# Patient Record
Sex: Female | Born: 1935 | Race: White | Hispanic: No | Marital: Married | Smoking: Never smoker
Health system: Southern US, Community
[De-identification: ages and names within clinical notes are randomized; demographics above are authoritative.]

## PROBLEM LIST (undated history)

## (undated) DIAGNOSIS — I099 Rheumatic heart disease, unspecified: Secondary | ICD-10-CM

## (undated) DIAGNOSIS — R609 Edema, unspecified: Secondary | ICD-10-CM

## (undated) DIAGNOSIS — I38 Endocarditis, valve unspecified: Secondary | ICD-10-CM

## (undated) DIAGNOSIS — I482 Chronic atrial fibrillation, unspecified: Secondary | ICD-10-CM

## (undated) DIAGNOSIS — Z7901 Long term (current) use of anticoagulants: Secondary | ICD-10-CM

## (undated) DIAGNOSIS — I351 Nonrheumatic aortic (valve) insufficiency: Secondary | ICD-10-CM

## (undated) DIAGNOSIS — M1990 Unspecified osteoarthritis, unspecified site: Secondary | ICD-10-CM

## (undated) HISTORY — DX: Edema, unspecified: R60.9

## (undated) HISTORY — DX: Chronic atrial fibrillation, unspecified: I48.20

## (undated) HISTORY — DX: Long term (current) use of anticoagulants: Z79.01

## (undated) HISTORY — PX: CATARACT EXTRACTION W/ INTRAOCULAR LENS  IMPLANT, BILATERAL: SHX1307

## (undated) HISTORY — PX: BACK SURGERY: SHX140

## (undated) HISTORY — DX: Nonrheumatic aortic (valve) insufficiency: I35.1

## (undated) HISTORY — DX: Unspecified osteoarthritis, unspecified site: M19.90

## (undated) HISTORY — PX: ABDOMINAL HYSTERECTOMY: SHX81

## (undated) HISTORY — DX: Endocarditis, valve unspecified: I38

## (undated) HISTORY — PX: MITRAL VALVE REPLACEMENT: SHX147

## (undated) HISTORY — PX: AORTIC VALVE REPLACEMENT: SHX41

## (undated) HISTORY — DX: Rheumatic heart disease, unspecified: I09.9

---

## 1999-07-30 ENCOUNTER — Emergency Department (HOSPITAL_COMMUNITY): Admission: EM | Admit: 1999-07-30 | Discharge: 1999-07-30 | Payer: Self-pay | Admitting: Emergency Medicine

## 1999-07-30 ENCOUNTER — Encounter: Payer: Self-pay | Admitting: Emergency Medicine

## 1999-08-11 ENCOUNTER — Encounter: Payer: Self-pay | Admitting: Neurological Surgery

## 1999-08-11 ENCOUNTER — Inpatient Hospital Stay (HOSPITAL_COMMUNITY): Admission: AD | Admit: 1999-08-11 | Discharge: 1999-08-15 | Payer: Self-pay | Admitting: Neurological Surgery

## 2000-11-02 ENCOUNTER — Ambulatory Visit (HOSPITAL_COMMUNITY): Admission: RE | Admit: 2000-11-02 | Discharge: 2000-11-02 | Payer: Self-pay | Admitting: Geriatric Medicine

## 2004-12-31 ENCOUNTER — Inpatient Hospital Stay (HOSPITAL_COMMUNITY): Admission: RE | Admit: 2004-12-31 | Discharge: 2005-01-05 | Payer: Self-pay | Admitting: Ophthalmology

## 2006-03-02 ENCOUNTER — Ambulatory Visit (HOSPITAL_COMMUNITY): Admission: RE | Admit: 2006-03-02 | Discharge: 2006-03-02 | Payer: Self-pay | Admitting: Geriatric Medicine

## 2010-05-06 ENCOUNTER — Ambulatory Visit: Payer: Self-pay | Admitting: Cardiology

## 2010-06-03 ENCOUNTER — Ambulatory Visit: Payer: Self-pay | Admitting: Cardiology

## 2010-06-19 ENCOUNTER — Ambulatory Visit (HOSPITAL_COMMUNITY): Admission: RE | Admit: 2010-06-19 | Discharge: 2010-06-19 | Payer: Self-pay | Admitting: Cardiology

## 2010-06-19 ENCOUNTER — Ambulatory Visit: Payer: Self-pay | Admitting: Cardiology

## 2010-06-24 ENCOUNTER — Ambulatory Visit: Payer: Self-pay | Admitting: Cardiology

## 2010-07-15 ENCOUNTER — Ambulatory Visit: Payer: Self-pay | Admitting: Cardiology

## 2010-08-05 ENCOUNTER — Ambulatory Visit: Payer: Self-pay | Admitting: Cardiology

## 2010-09-16 ENCOUNTER — Ambulatory Visit: Payer: Self-pay | Admitting: Cardiology

## 2010-10-16 ENCOUNTER — Ambulatory Visit: Payer: Self-pay | Admitting: Cardiology

## 2010-10-29 ENCOUNTER — Ambulatory Visit: Payer: Self-pay | Admitting: Cardiology

## 2010-11-12 ENCOUNTER — Encounter: Payer: Self-pay | Admitting: Cardiology

## 2010-11-12 DIAGNOSIS — Z7901 Long term (current) use of anticoagulants: Secondary | ICD-10-CM | POA: Insufficient documentation

## 2010-11-12 DIAGNOSIS — I099 Rheumatic heart disease, unspecified: Secondary | ICD-10-CM | POA: Insufficient documentation

## 2010-11-12 DIAGNOSIS — M1990 Unspecified osteoarthritis, unspecified site: Secondary | ICD-10-CM | POA: Insufficient documentation

## 2010-11-12 DIAGNOSIS — I482 Chronic atrial fibrillation, unspecified: Secondary | ICD-10-CM | POA: Insufficient documentation

## 2010-11-12 DIAGNOSIS — I351 Nonrheumatic aortic (valve) insufficiency: Secondary | ICD-10-CM | POA: Insufficient documentation

## 2010-11-12 DIAGNOSIS — I38 Endocarditis, valve unspecified: Secondary | ICD-10-CM | POA: Insufficient documentation

## 2010-11-25 ENCOUNTER — Other Ambulatory Visit: Payer: Medicare Other

## 2010-12-11 ENCOUNTER — Encounter (INDEPENDENT_AMBULATORY_CARE_PROVIDER_SITE_OTHER): Payer: Medicare Other

## 2010-12-11 DIAGNOSIS — I4891 Unspecified atrial fibrillation: Secondary | ICD-10-CM

## 2010-12-30 ENCOUNTER — Other Ambulatory Visit: Payer: Medicare Other | Admitting: *Deleted

## 2010-12-31 ENCOUNTER — Ambulatory Visit: Payer: Self-pay | Admitting: Cardiology

## 2011-01-06 ENCOUNTER — Ambulatory Visit: Payer: Medicare Other | Admitting: Cardiology

## 2011-01-06 ENCOUNTER — Ambulatory Visit (INDEPENDENT_AMBULATORY_CARE_PROVIDER_SITE_OTHER): Payer: Medicare Other | Admitting: Cardiology

## 2011-01-06 ENCOUNTER — Encounter: Payer: Self-pay | Admitting: Cardiology

## 2011-01-06 VITALS — BP 166/60 | HR 74 | Ht 65.0 in | Wt 137.4 lb

## 2011-01-06 DIAGNOSIS — I482 Chronic atrial fibrillation, unspecified: Secondary | ICD-10-CM

## 2011-01-06 DIAGNOSIS — I38 Endocarditis, valve unspecified: Secondary | ICD-10-CM

## 2011-01-06 DIAGNOSIS — I359 Nonrheumatic aortic valve disorder, unspecified: Secondary | ICD-10-CM | POA: Insufficient documentation

## 2011-01-06 DIAGNOSIS — I4891 Unspecified atrial fibrillation: Secondary | ICD-10-CM

## 2011-01-06 DIAGNOSIS — R030 Elevated blood-pressure reading, without diagnosis of hypertension: Secondary | ICD-10-CM

## 2011-01-06 DIAGNOSIS — Z7901 Long term (current) use of anticoagulants: Secondary | ICD-10-CM

## 2011-01-06 LAB — PROTIME-INR: Prothrombin Time: 30.6 seconds — ABNORMAL HIGH (ref 11.6–15.2)

## 2011-01-06 NOTE — Progress Notes (Signed)
HPI [name] Chung is seen today for followup. She does complain of frequent headaches which occur almost every day. She has occasional lightheadedness if she gets up quickly or turns quickly. She remains very active walking on her treadmill 5 days a week. She denies any chest pain, shortness of breath, or palpitations. Allergies  Allergen Reactions  . Asa Buff (Mag (Aspirin Buffered)   . Codeine   . Ultram (Tramadol Hcl)     Current Outpatient Prescriptions on File Prior to Visit  Medication Sig Dispense Refill  . Ascorbic Acid (VITAMIN C PO) Take by mouth 3 (three) times a week.       Marland Kitchen BETA CAROTENE PO Take by mouth daily.        . butalbital-acetaminophen-caffeine (FIORICET, ESGIC) 50-325-40 MG per tablet 1 tablet Twice daily.      . digoxin (LANOXIN) 0.25 MG tablet Take 375 mcg by mouth daily.        Marland Kitchen estrogens, conjugated, (PREMARIN) 1.25 MG tablet Take 1.25 mg by mouth daily.        . hydrochlorothiazide 50 MG tablet Take 50 mg by mouth daily.        Marland Kitchen LORazepam (ATIVAN) 1 MG tablet Take 1 mg by mouth as needed.        . temazepam (RESTORIL) 30 MG capsule Take 30 mg by mouth daily.       . Warfarin Sodium (COUMADIN PO) Take by mouth as directed.          Past Medical History  Diagnosis Date  . Valvular heart disease   . Chronic atrial fibrillation   . Chronic anticoagulation   . Rheumatic heart disease   . Aortic insufficiency   . Arthritis     Past Surgical History  Procedure Date  . Aortic valve replacement   . Mitral valve replacement   . Back surgery   . Abdominal hysterectomy   . Cataract extraction w/ intraocular lens  implant, bilateral     Family History  Problem Relation Age of Onset  . Heart attack Mother   . Stroke Mother   . Diabetes Mother   . Other Father     CAR ACCIDENT    History   Social History  . Marital Status: Married    Spouse Name: N/A    Number of Children: 0  . Years of Education: N/A   Occupational History  . 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 Smoking status: Never Smoker   . Smokeless tobacco: Not on file  . Alcohol Use: No  . Drug Use: No  . Sexually Active: Not on file   Other Topics Concern  . Not on file   Social History Narrative  . No narrative on file    ROS The patient denies any heat or cold intolerance.  No weight gain or weight loss.  The patient denies blurry vision.  There is no cough or sputum production.  The patient denies dizziness.  There is no hematuria or hematochezia.  The patient denies any muscle aches or arthritis.  The patient denies any rash.  The patient denies frequent falling or instability.  There is no history of depression or anxiety.  All other systems were reviewed and are negative.  PHYSICAL EXAM BP 166/60  Pulse 74  Ht 5\' 5"  (1.651 m)  Wt 137 lb 6.4 oz (62.324 kg)  BMI 22.86 kg/m2 She is an elderly female in no acute distress.  Pupils are equal round and reactive to light and accommodation. Sclera clear. Oropharynx is clear. Neck is supple no JVD, adenopathy, thyromegaly, or bruits. Carotid upstrokes are good. Lungs are clear. Cardiac exam reveals a good mechanical mitral valve click. There is a grade 2/6 holosystolic murmur in the aortic area radiating to the apex. She also has a grade 2/6 diastolic murmur at the right upper sternal border. PMI is normal. There is no S3. Abdomen soft and nontender without masses or bruits. Femoral and pedal pulses are 2+ and symmetric. She has no edema. Skin is warm and dry. She is alert and oriented x3. Cranial nerves II through XII are intact.  Laboratory data: INR today is 3.1.  ASSESSMENT AND PLAN

## 2011-01-06 NOTE — Assessment & Plan Note (Signed)
Rate is well controlled. She is anticoagulated. She is asymptomatic. Continue current therapy.

## 2011-01-06 NOTE — Assessment & Plan Note (Signed)
Blood pressure is unusually elevated today. Recommended a low sodium diet. I have asked that she monitor her blood pressure closely at home and if it remains elevated we may need to add antihypertensive therapy.

## 2011-01-06 NOTE — Assessment & Plan Note (Signed)
Patient's last echocardiogram in September 2011 showed stable valve prosthetic function. She has chronic moderate aortic insufficiency. Left ventricular function is normal. She is asymptomatic. We will continue with anticoagulant therapy and routine SBE prophylaxis. We will repeat an echocardiogram in the fall.

## 2011-01-06 NOTE — Patient Instructions (Signed)
Continue regular exercise.  Monitor blood pressure at home. Call if your readings are staying above 140 systolic.  Avoid sodium.  Continue current Coumadin dose and we will recheck an INR in 4 weeks.   We will schedule a follow up office visit in 6 months.

## 2011-01-06 NOTE — Assessment & Plan Note (Signed)
INR is therapeutic. We will recheck again in 4 weeks.

## 2011-01-08 ENCOUNTER — Telehealth: Payer: Self-pay | Admitting: Cardiology

## 2011-01-08 NOTE — Telephone Encounter (Signed)
Message copied by Murrell Redden on Thu Jan 08, 2011 11:44 AM ------      Message from: [place], PETER      Created: Wed Jan 07, 2011  5:38 PM       Lab drawn Inr and finger stick are comparable 2.93 and 3.1. Please report.

## 2011-01-08 NOTE — Telephone Encounter (Signed)
PT WANTING HER LATEST LAB RESULTS.

## 2011-01-08 NOTE — Telephone Encounter (Signed)
LM that coumadin draw was 3.1;compared to finger stick 2.9;  CSD

## 2011-02-03 ENCOUNTER — Ambulatory Visit (INDEPENDENT_AMBULATORY_CARE_PROVIDER_SITE_OTHER): Payer: Medicare Other | Admitting: *Deleted

## 2011-02-03 DIAGNOSIS — Z7901 Long term (current) use of anticoagulants: Secondary | ICD-10-CM | POA: Insufficient documentation

## 2011-02-03 DIAGNOSIS — I359 Nonrheumatic aortic valve disorder, unspecified: Secondary | ICD-10-CM

## 2011-02-03 DIAGNOSIS — Z954 Presence of other heart-valve replacement: Secondary | ICD-10-CM | POA: Insufficient documentation

## 2011-02-06 ENCOUNTER — Encounter: Payer: Self-pay | Admitting: Cardiology

## 2011-02-06 ENCOUNTER — Encounter: Payer: Self-pay | Admitting: Cardiovascular Disease

## 2011-02-20 NOTE — H&P (Signed)
[place]  Patient:    [name] Chung                        MRN: [id number] Adm. Date:  [id number] Attending:  Jonne Ply                         History and Physical  ADMISSION DIAGNOSIS:  Herniated nucleus pulposus, L4-5, with left L5 radiculopathy.  HISTORY OF PRESENT ILLNESS:  The patient is a 77 year old right-handed individual who was seen by me a little over a week and a half ago.  She had developed a severe and unrelenting left lower extremity radiculopathy, seemingly in an L5 distribution.  She was started on oral steroids but found the pain was unresponsive to this modality of therapy.  She has been miserable with pain despite using significant doses of narcotic analgesics.  Complicating the patients course is he fact that she has had valvular heart disease and has had two replacements of two valves.  In 1982, she had aortic and mitral valve replacements with Bjork-Shiley prostheses; this was redone in 1995 and she had a St. Jude mitral valve and an allograft aortic valve placed.  She has been in chronic atrial fibrillation and has been maintained on Coumadin for the mechanical valve.  Patient has had her anticoagulation stopped this past Saturday in preparation for surgical decompression of her disc herniation today.  The patient has otherwise been tolerating activities well, save for the most recent back and left lower extremity pain.  PAST MEDICAL HISTORY:  She has had rheumatic heart disease.  She has no hypertension, diabetes, stroke or cancer.  There is no history of tuberculosis.  She has had a bone graft procedure during her open heart surgery in 1995 with the valve replacements as noted above.  She has had a hysterectomy for fibroids in he past.  CURRENT MEDICATIONS: 1. Coumadin 10 mg alternating with 7.5 mg every other day. 2. Lanoxin 0.25 mg one and a half tablets a day. 3. Potassium 20 mEq a day. 4.  Hydrochlorothiazide 50 ___ a day. 5. Premarin 1.25 mg daily. 6. Restoril 30 mg a day at bedtime. 7. Vitamin C and calcium.  SOCIAL HISTORY:  The patient is widowed.  She lives along.  FAMILY HISTORY:  Her father died in 32s in motor vehicle accident.  Mother died at age 31; had diabetes, stroke and myocardial infarction.  She has a brother, ge 63, with apparently no medical problems.  She had a sister who died secondary to Alzheimers disease.  She has another sister in good health.  One sister died at  age 43 secondary to suicide.  She has no children.  REVIEW OF SYSTEMS:  Notable only for the items in the history of present illness.  PHYSICAL EXAMINATION:  GENERAL:  She is an alert, oriented and cooperative individual in no overt distress when she is sitting; however, she stands very slowly and cautiously.  She walks  with a 10 degree forward stoop.  She is very antalgic on the left lower extremity.  BACK:  Palpation of her back reveals a remarkable amount of paravertebral spinal tenderness, particularly over the lumbosacral junction.  NEUROLOGIC:  Motor strength in the lower extremities reveals iliopsoas, quadriceps, tibialis anterior and gastrocnemii to have good tone and bulk save for the tibialis anterior on the left side, which is weak to 4/5.  Sensation is diminished  to pin and light touch at the ankle; it is otherwise normal.  Deep tendon reflexes are 2+ in both patellae, 1+ in the Achilles.  Babinskis are downgoing in the upper extremities.  Her reflexes are normal.  Tone and bulk in the upper extremities re normal.  Cranial nerve examination reveals the pupils to be 3 mm, briskly reactive to light and accommodation.  Extraocular movements are full.  Face is symmetric to grimace.  Tongue and uvula are in the midline.  Sclerae and conjunctivae are clear.  NECK:  No masses and no bruits are heard.  LUNGS:  Clear to auscultation.  HEART:  Mechanical  heart sound plus a prominent systolic/diastolic murmur.  ABDOMEN:  Soft.  Bowel sounds are positive.  No masses are palpable.  EXTREMITIES:  No clubbing, cyanosis, or edema.  IMPRESSION:  The patient has a herniated nucleus pulposus at L4-5 on the left. She is now being admitted to undergo surgical decompression followed by re-coumadinization.  She has been offered Coumadin for the last two days and her INR today is 1.2 with a prothrombin time of 14.5 seconds.  DD:  08/11/99 TD:  08/12/99 Job: [id number]

## 2011-02-20 NOTE — Op Note (Signed)
[place]  Patient:    [name] Chung                        MRN: [id number] Proc. Date: 08/11/99 Adm. Date:  [id number] Attending:  Jonne Ply                           Operative Report  PREOPERATIVE DIAGNOSIS:  L4-5 herniated nucleus pulposus with left lumbar radiculopathy.  POSTOPERATIVE DIAGNOSIS:  L4-5 herniated nucleus pulposus with left lumbar radiculopathy.  PROCEDURE:  Lumbar laminotomy and microdiskectomy, L4-5 left.  SURGEON:  Stefani Dama, M.D.  ASSISTANT:  Hewitt Shorts, M.D.  ANESTHESIA:  General endotracheal.  INDICATIONS:   The patient is a 77 year old individual, who has had a left lumbar radiculopathy secondary to a herniated nucleus pulposus at L4-5.  She has been intolerant of the pain despite significant medication.  She has the added complication of being on Coumadin anticoagulation for a prosthetic heart valve.  The coagulopathy has been reversed for this procedure.  DESCRIPTION OF PROCEDURE:  The patient was brought to the operating room supine on a stretcher.  After smooth induction of general endotracheal anesthesia, she was turned prone.  The back was shaved, prepped with Duraprep and draped in a sterile fashion.  A localizing x-ray was taken, then a small incision was created over he midline of the dorsum of the back and carried down to the lumbodorsal fascia, which was opened on the left side.  Intralaminar space of L4-5 was then cleared and a  laminotomy was created after a self-retaining retractor was placed into the wound. The inferior margin of the lamina of L4 was removed out to the mesial wall of the facet.  The bony edges were waxed for hemostasis.  Then, the redundant yellow ligament in this area was taken up with 2 and 3 mm Kerrison punch.  Laminotomy as enlarged.  The L5 nerve root was noted to be pushed out laterally against the bony edge.  This was retracted gently over a  significant mass.  The mass was at the [facility] space and just inferior to it.  It was incised with a #15 blade and then a combination of curets and rongeurs were used to evacuate the disk space.  This portion of the procedure was done with magnification of a microscope and microdissection technique, so as to minimize any hemorrhage in this area.  Care was taken to protect the L5 nerve root while a diskectomy ensued.  Once the disk space was completely evacuated of a significant quantity of markedly degenerated material, the area was checked for hemostasis.  The L5 nerve root was noted to e free and clear in its travel out the foramen.  The posterior longitudinal ligament was noted to be laxed and free and not buckled.  Area was then irrigated copiously with antibiotic irrigating solution and microscope was removed from the field and the lumbodorsal fascia was closed with #1 Vicryl in interrupted fashion, 2-0 Vicryl in the subcutaneous and subcuticular tissues.  Steri-Strips are placed on the skin. Patient tolerated the procedure well, was returned to recovery room in stable condition. DD:  08/11/99 TD:  08/13/99 Job: [id number]

## 2011-02-20 NOTE — H&P (Signed)
[name] Chung, [name] Chung NO.:  [id number]   MEDICAL RECORD NO.:  [id number]          PATIENT TYPE:  INP   LOCATION:  NA                           FACILITY:  [facility]   PHYSICIAN:  Peter M. [place], M.D.  DATE OF BIRTH:  [birth year]   DATE OF ADMISSION:  DATE OF DISCHARGE:                                HISTORY & PHYSICAL   HISTORY OF PRESENT ILLNESS:  [name] Chung is a 77 year old white female who has  a history of valvular heart disease and chronic atrial fibrillation.  She is  requiring eye surgery for blepharoplasty and due to the fact that she needs  to come off her Coumadin she is now admitted for bridging heparin therapy.  [name] initially underwent valve surgery in [facility] at the [facility] with Bjork-Shiley valves.  She had redo valve surgery in 1995 at  [facility] using a homograft aortic valve and a St.  Jude mechanical mitral valve.  She has a history of chronic atrial  fibrillation and flutter.  She has been on chronic anticoagulant therapy.  She does have chronic aortic insufficiency.  She has been asymptomatic.  However, given her eye risk it was felt that she requires bridging heparin  therapy for her surgery and she is admitted for that purpose.  The patient  denies shortness of breath, chest pain, palpitation, dizziness, or syncope.  She has had vision loss related to prior eye surgery and is admitted for  blepharoplasty to try to improve her vision.   PAST MEDICAL HISTORY:  1.  Status post aortic mitral valve replacements as previously noted.  2.  History of chronic atrial fibrillation and flutter.  3.  History of chronic aortic insufficiency.  4.  Prior low back surgery in 2000.  5.  Previous hysterectomy.  6.  Previous eye surgery x3 with cataract removal and lens implants x2.   ALLERGIES:  CODEINE.   CURRENT MEDICATIONS:  1.  Coumadin 7.5 mg five days a week, 10 mg two days a week.  2.  Lanoxin 0.25 mg 1-1/2  tablets daily.  3.  Potassium 10 mEq daily.  4.  Hydrochlorothiazide 50 mg/d.  5.  Temazepam q.h.s.  6.  Multivitamin daily.   SOCIAL HISTORY:  The patient is married.  She has no children.  She is  retired.  She denies tobacco or alcohol use.   FAMILY HISTORY:  Father died of motor vehicle accident.  Mother died at age  67 with a stroke and also had myocardial infarction.  She has four siblings  who have passed away.   REVIEW OF SYSTEMS:  She has had no edema, orthopnea, or PND.  She has had no  syncope.  She has had no recent bleeding disorders.  No recent bowel or  bladder complaints.  Other review of systems are negative.   PHYSICAL EXAMINATION:  The patient is a pleasant white female in no  distress.  Weight is 137, blood pressure 150/60, pulse 72 and irregular.  Weight is 137.  Respirations are normal.  HEENT:  Pupils are  round and reactive.  Sclerae are clear.  Conjunctivae are  clear.  Oropharynx is clear.  NECK:  Without JVD, adenopathy, or bruits.  She has normal carotid  upstrokes.  LUNGS:  Clear to auscultation and percussion.  CARDIAC:  A good mechanical mitral valve click.  There is grade 2-3/6  holodiastolic murmur heard best at the aortic area radiating to the left  sternal border.  There is no S3.  ABDOMEN:  Soft and nontender.  No masses or hepatosplenomegaly.  EXTREMITIES:  Without edema.  NEUROLOGIC:  Nonfocal.   IMPRESSION:  1.  Combined valvular heart disease with homograft aortic valve replacement      and mechanical St. Jude mitral valve replacement.  2.  Chronic aortic insufficiency.  3.  Chronic atrial fibrillation with flutter.  4.  Vision loss related to prior eye surgery.  5.  Chronic anticoagulation.   PLAN:  Coumadin was held as of 12/30/2004.  We will admit her today, obtain  routine lab work.  When pro time drops below 2 will initiate IV heparin  therapy per protocol.  Anticipate eye surgery on Friday and then will resume  Coumadin  afterwards with bridging heparin.  I will continue her other  medications as before.  The patient does not need SBE prophylaxis for this  procedure.      PMJ/MEDQ  D:  12/31/2004  T:  12/31/2004  Job:  [id number]   cc:   Lilian Kapur. Dillon Bjork, M.D.  Fax: [phone]   Lloyd Huger MD [facility]

## 2011-02-20 NOTE — Discharge Summary (Signed)
[name], [name] NO.:  Chung   MEDICAL RECORD NO.:  [id number]          PATIENT TYPE:  INP   LOCATION:  3709                         FACILITY:  [facility]   PHYSICIAN:  Peter M. [place], M.D.  DATE OF BIRTH:  05-Mar-1933   DATE OF ADMISSION:  12/31/2004  DATE OF DISCHARGE:  01/05/2005                                 DISCHARGE SUMMARY   HISTORY OF PRESENT ILLNESS:  [name] Chung is a 77 year old, white female who  has a history of combined valvular heart disease.  She is status post redo  valve surgery with a homograft aortic valve replacement and a St. Jude  mechanical mitral valve replacement.  She has a history of atrial  fibrillation and flutter.  She required blepharoplasty for improvement in  vision and was admitted for bridging heparin therapy while off her Coumadin  for surgery.  She has chronic aortic insufficiency.  For details for past  medical history, social history, family history and physical exam, please  see admission History and Physical.   LABORATORY DATA:  White count 6800, hemoglobin 12.7, hematocrit 36.6,  platelets 166,000.  Protime on admission was 15.9 with an INR of 1.4.  Her  Coumadin had been held the day prior to admission.  Sodium 135, potassium  4.1, chloride 97, CO2 31, BUN 20, creatinine 0.7, glucose of 84.  All other  chemistries were normal.  Digoxin level 1.0.   Chest x-ray showed no active disease.  ECG showed ectopic atrial rhythm with  minor nonspecific ST abnormality.   HOSPITAL COURSE:  The patient was admitted to telemetry.  Her Coumadin was  held.  She was placed on IV heparin per pharmacy protocol.  Her INR  decreased to 1.1 by the day of surgery, January 02, 2005.  She underwent  surgery by Dr. Dillon Bjork with blepharoplasty which was tolerated well.  Following surgery the patient's Coumadin was resumed.  She was started back  on IV heparin.  She was given 12.5 mg of oral Coumadin for the initial 3  days.  Her INR initially  declined to 1.0, then 1.2 and on the day of  discharge, it increased to 1.8.  It is felt at this time she was stable for  discharge.  She had no other complications.   DISCHARGE DIAGNOSES:  1.  Status post blepharoplasty.  2.  Combined valvular heart disease, status post aortic and mitral valve      replacements.  3.  History of atrial fibrillation flutter, now in ectopic atrial rhythm      with stable rate.  4.  Chronic aortic insufficiency.  5.  Coumadin therapy.   DISCHARGE MEDICATIONS:  1.  Coumadin 10 mg today and then resume her prior doses 7.5 mg daily except      10 mg on Tuesday and Thursday.  2.  Lanoxin 0.25 mg 1/2 tablet daily.  3.  Potassium 10 mEq daily.  4.  HCTZ 50 mg per day.  5.  Multivitamin daily.   FOLLOW UP:  Patient will have followup protime on Tuesday, April 11.   CONDITION  ON DISCHARGE:  Improved.      PMJ/MEDQ  D:  01/05/2005  T:  01/05/2005  Job:  [id number]   cc:   Salem Senate, M.D.  [facility]   Hal T. Stoneking, M.D.  [address] [place], [place] [address]  Fax: [phone]

## 2011-03-03 ENCOUNTER — Ambulatory Visit (INDEPENDENT_AMBULATORY_CARE_PROVIDER_SITE_OTHER): Payer: Medicare Other | Admitting: *Deleted

## 2011-03-03 DIAGNOSIS — I359 Nonrheumatic aortic valve disorder, unspecified: Secondary | ICD-10-CM

## 2011-04-07 ENCOUNTER — Ambulatory Visit (INDEPENDENT_AMBULATORY_CARE_PROVIDER_SITE_OTHER): Payer: Medicare Other | Admitting: *Deleted

## 2011-04-07 DIAGNOSIS — I359 Nonrheumatic aortic valve disorder, unspecified: Secondary | ICD-10-CM

## 2011-04-07 LAB — POCT INR: INR: 2.5

## 2011-05-05 ENCOUNTER — Ambulatory Visit (INDEPENDENT_AMBULATORY_CARE_PROVIDER_SITE_OTHER): Payer: Medicare Other | Admitting: *Deleted

## 2011-05-05 ENCOUNTER — Telehealth: Payer: Self-pay | Admitting: Cardiology

## 2011-05-05 DIAGNOSIS — I359 Nonrheumatic aortic valve disorder, unspecified: Secondary | ICD-10-CM

## 2011-05-05 LAB — POCT INR: INR: 3.2

## 2011-05-05 NOTE — Telephone Encounter (Signed)
Patient was checking out and wanted to know when her ECHO was suppused to be done. Was under the belief that it was supposed to be done before both her and her husbands appointments in October. Please call back. I have pulled the chart.

## 2011-05-07 ENCOUNTER — Other Ambulatory Visit: Payer: Self-pay | Admitting: *Deleted

## 2011-05-07 DIAGNOSIS — Z952 Presence of prosthetic heart valve: Secondary | ICD-10-CM

## 2011-05-07 NOTE — Telephone Encounter (Signed)
Called wanting to schedule echo before app in Oct. Scheduled for 10/16

## 2011-06-02 ENCOUNTER — Encounter: Payer: Medicare Other | Admitting: *Deleted

## 2011-06-02 ENCOUNTER — Ambulatory Visit (INDEPENDENT_AMBULATORY_CARE_PROVIDER_SITE_OTHER): Payer: Medicare Other | Admitting: *Deleted

## 2011-06-02 DIAGNOSIS — I359 Nonrheumatic aortic valve disorder, unspecified: Secondary | ICD-10-CM

## 2011-06-23 ENCOUNTER — Encounter: Payer: Self-pay | Admitting: Cardiology

## 2011-06-30 ENCOUNTER — Ambulatory Visit (INDEPENDENT_AMBULATORY_CARE_PROVIDER_SITE_OTHER): Payer: Medicare Other | Admitting: *Deleted

## 2011-06-30 DIAGNOSIS — I359 Nonrheumatic aortic valve disorder, unspecified: Secondary | ICD-10-CM

## 2011-07-21 ENCOUNTER — Ambulatory Visit (HOSPITAL_COMMUNITY): Payer: Medicare Other | Attending: Cardiology | Admitting: Radiology

## 2011-07-21 DIAGNOSIS — Z952 Presence of prosthetic heart valve: Secondary | ICD-10-CM

## 2011-07-21 DIAGNOSIS — I059 Rheumatic mitral valve disease, unspecified: Secondary | ICD-10-CM | POA: Insufficient documentation

## 2011-07-21 DIAGNOSIS — I359 Nonrheumatic aortic valve disorder, unspecified: Secondary | ICD-10-CM

## 2011-07-23 ENCOUNTER — Telehealth: Payer: Self-pay | Admitting: *Deleted

## 2011-07-23 NOTE — Telephone Encounter (Signed)
Will discuss at OV on 10/23 per Dr. Swaziland. He wants to review prior Echos.

## 2011-07-23 NOTE — Telephone Encounter (Signed)
Message copied by Lorayne Bender on Thu Jul 23, 2011 11:23 AM ------      Message from: [place], PETER M      Created: Tue Jul 21, 2011  5:16 PM       Moderate to severe AI-chronic. LV dysfunction is worrisome. We need old chart to compare. Can review with patient next week at her visit.       Theron Arista [place]

## 2011-07-28 ENCOUNTER — Encounter (INDEPENDENT_AMBULATORY_CARE_PROVIDER_SITE_OTHER): Payer: Medicare Other | Admitting: *Deleted

## 2011-07-28 ENCOUNTER — Ambulatory Visit (INDEPENDENT_AMBULATORY_CARE_PROVIDER_SITE_OTHER): Payer: Medicare Other | Admitting: *Deleted

## 2011-07-28 ENCOUNTER — Encounter: Payer: Self-pay | Admitting: Cardiology

## 2011-07-28 ENCOUNTER — Ambulatory Visit (INDEPENDENT_AMBULATORY_CARE_PROVIDER_SITE_OTHER): Payer: Medicare Other | Admitting: Cardiology

## 2011-07-28 VITALS — BP 156/58 | HR 87 | Ht 65.0 in | Wt 137.0 lb

## 2011-07-28 DIAGNOSIS — Z954 Presence of other heart-valve replacement: Secondary | ICD-10-CM

## 2011-07-28 DIAGNOSIS — Z7901 Long term (current) use of anticoagulants: Secondary | ICD-10-CM

## 2011-07-28 DIAGNOSIS — I38 Endocarditis, valve unspecified: Secondary | ICD-10-CM

## 2011-07-28 DIAGNOSIS — I10 Essential (primary) hypertension: Secondary | ICD-10-CM

## 2011-07-28 DIAGNOSIS — I359 Nonrheumatic aortic valve disorder, unspecified: Secondary | ICD-10-CM

## 2011-07-28 DIAGNOSIS — Q8789 Other specified congenital malformation syndromes, not elsewhere classified: Secondary | ICD-10-CM

## 2011-07-28 DIAGNOSIS — I482 Chronic atrial fibrillation, unspecified: Secondary | ICD-10-CM

## 2011-07-28 DIAGNOSIS — Z952 Presence of prosthetic heart valve: Secondary | ICD-10-CM

## 2011-07-28 DIAGNOSIS — Q898 Other specified congenital malformations: Secondary | ICD-10-CM

## 2011-07-28 DIAGNOSIS — I351 Nonrheumatic aortic (valve) insufficiency: Secondary | ICD-10-CM

## 2011-07-28 DIAGNOSIS — I4891 Unspecified atrial fibrillation: Secondary | ICD-10-CM

## 2011-07-28 LAB — POCT INR: INR: 3

## 2011-07-28 MED ORDER — AMLODIPINE BESYLATE 5 MG PO TABS: 5.0000 mg | ORAL_TABLET | Freq: Every day | ORAL | Status: DC

## 2011-07-28 NOTE — Assessment & Plan Note (Addendum)
She has complex valvular heart disease with redo mitral and aortic valve replacements. She has moderate to severe aortic insufficiency. I think she does have mild symptoms with shortness of breath. It is concerning that her ejection fraction has decreased from normal to 45-50%. I recommended the addition of amlodipine 5 mg daily for afterload reduction. We'll follow her closely for worsening symptoms. I would like to repeat an echocardiogram in 3 months. If she has any progression of her symptoms or worsening of her LV function and I think she will need to have a right and left heart catheterization with consideration for repeat valve surgery. I've requested a copy of her most recent lab work from Dr. Pete Glatter.

## 2011-07-28 NOTE — Assessment & Plan Note (Signed)
Her ECG today demonstrates a fibrillation with controlled ventricular response. She has nonspecific ST-T wave abnormality. We will continue with rate control and anticoagulation.

## 2011-07-28 NOTE — Progress Notes (Signed)
HPI [name] is seen today for followup. She continues to exercise on her treadmill 5 days a week walking at 3.5 miles per hour on a level grade. She has noticed some shortness of breath on occasion. This is associated with yard work or getting up quickly. It usually is very transient lasting only a few minutes. He gets lightheaded if she gets up quickly and turns. She does have a chronic headache. She does note that her energy level is lower than it has been in the past. She denies any chest pain or syncope. Her blood pressure at home typically runs about 140 systolic.  Allergies  Allergen Reactions  . Asa Buff (Mag (Aspirin Buffered)   . Codeine   . Ultram (Tramadol Hcl)     Current Outpatient Prescriptions on File Prior to Visit  Medication Sig Dispense Refill  . Ascorbic Acid (VITAMIN C PO) Take by mouth once a week.       Marland Kitchen BETA CAROTENE PO Take by mouth daily.        . butalbital-acetaminophen-caffeine (FIORICET, ESGIC) 50-325-40 MG per tablet 1 tablet Twice daily.      . digoxin (LANOXIN) 0.25 MG tablet Take 375 mcg by mouth daily. 1 and 1/2 tablet daily      . estrogens, conjugated, (PREMARIN) 1.25 MG tablet Take 1.25 mg by mouth as needed.       . hydrochlorothiazide 50 MG tablet Take 50 mg by mouth daily.        Marland Kitchen KLOR-CON M10 10 MEQ tablet 2 tablets daily before breakfast.      . LORazepam (ATIVAN) 1 MG tablet Take 1 mg by mouth as needed.        . temazepam (RESTORIL) 30 MG capsule Take 30 mg by mouth daily.       . Warfarin Sodium (COUMADIN PO) Take 10 mg by mouth as directed.         Past Medical History  Diagnosis Date  . Valvular heart disease   . Chronic atrial fibrillation   . Chronic anticoagulation   . Rheumatic heart disease   . Aortic insufficiency   . Arthritis     Past Surgical History  Procedure Date  . Aortic valve replacement     1982, redo 1995 homograft  . Mitral valve replacement     1982, redo 1995 St Jude  valve  . Back surgery   . Abdominal  hysterectomy   . Cataract extraction w/ intraocular lens  implant, bilateral     Family History  Problem Relation Age of Onset  . Heart attack Mother   . Stroke Mother   . Diabetes Mother   . Other Father     CAR ACCIDENT    History   Social History  . Marital Status: Married    Spouse Name: N/A    Number of Children: 0  . Years of Education: N/A   Occupational History  . 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 Smoking status: Never Smoker   . Smokeless tobacco: Not on file  . Alcohol Use: No  . Drug Use: No  . Sexually Active: Not on file   Other Topics Concern  . Not on file   Social History Narrative  . No narrative on file    ROS  As noted in history of present illness. All other systems were reviewed and are negative.  PHYSICAL EXAM BP 156/58  Pulse 87  Ht 5\' 5"  (  1.651 m)  Wt 137 lb (62.143 kg)  BMI 22.80 kg/m2 She is an elderly female in no acute distress. Pupils are equal round and reactive to light and accommodation. Sclera clear. Oropharynx is clear. Neck is supple no JVD, adenopathy, thyromegaly, or bruits. Carotid upstrokes are good. Lungs are clear. Cardiac exam reveals a good mechanical mitral valve click. There is a grade 2/6 holosystolic murmur in the aortic area radiating to the apex. She also has a grade 3/6 diastolic murmur at the right upper sternal border. PMI is normal. There is no S3. Abdomen soft and nontender without masses or bruits. Femoral and pedal pulses are 2+ and symmetric. She has no edema. Skin is warm and dry. She is alert and oriented x3. Cranial nerves II through XII are intact.  Laboratory data: INR today is 3.0. Recent echocardiogram showed moderate to severe aortic insufficiency with her homograft valve. There is also been a decline in her left ventricular ejection fraction to 45-50%. She has mechanical mitral valve prosthesis with moderate stenosis.  ASSESSMENT AND PLAN

## 2011-07-28 NOTE — Assessment & Plan Note (Signed)
She is therapeutic on her INR.

## 2011-07-28 NOTE — Patient Instructions (Signed)
Continue your current medications.  We will add amlodipine 5 mg daily for blood pressure.  We will get another Echocardiogram in 3 months.  I will see you following the Echo.

## 2011-09-08 ENCOUNTER — Encounter: Payer: Medicare Other | Admitting: *Deleted

## 2011-09-21 ENCOUNTER — Telehealth: Payer: Self-pay | Admitting: Cardiology

## 2011-09-21 NOTE — Telephone Encounter (Signed)
The pt will be discharge from [facility] today after undergoing surgical procedure Aortic Root Replacement with Ascending Hemi-arch of aorta replacement and tricuspid valve repair. Mechanical mitral valve remains in place. The pt will be taking Coumadin 5mg  daily and be seen in the coumadin clinic this Thursday. Pt will be started on Avelox 400mg  daily starting today.    Coumadin 5 mg given 12/12; 12/13; 12/14  INR's as follows: 12/15 2.3 coumadin 5mg        12/16 2.65 coumadin 6mg        12/17 3.24 coumadin 5 mg

## 2011-09-23 DIAGNOSIS — Z48812 Encounter for surgical aftercare following surgery on the circulatory system: Secondary | ICD-10-CM | POA: Diagnosis not present

## 2011-09-23 DIAGNOSIS — Z7982 Long term (current) use of aspirin: Secondary | ICD-10-CM | POA: Diagnosis not present

## 2011-09-23 DIAGNOSIS — Z7901 Long term (current) use of anticoagulants: Secondary | ICD-10-CM | POA: Diagnosis not present

## 2011-09-24 ENCOUNTER — Ambulatory Visit (INDEPENDENT_AMBULATORY_CARE_PROVIDER_SITE_OTHER): Payer: Medicare Other | Admitting: *Deleted

## 2011-09-24 DIAGNOSIS — I359 Nonrheumatic aortic valve disorder, unspecified: Secondary | ICD-10-CM

## 2011-09-24 DIAGNOSIS — Z7901 Long term (current) use of anticoagulants: Secondary | ICD-10-CM

## 2011-09-24 DIAGNOSIS — Z954 Presence of other heart-valve replacement: Secondary | ICD-10-CM

## 2011-09-25 DIAGNOSIS — Z48812 Encounter for surgical aftercare following surgery on the circulatory system: Secondary | ICD-10-CM | POA: Diagnosis not present

## 2011-09-25 DIAGNOSIS — Z7901 Long term (current) use of anticoagulants: Secondary | ICD-10-CM | POA: Diagnosis not present

## 2011-09-25 DIAGNOSIS — Z7982 Long term (current) use of aspirin: Secondary | ICD-10-CM | POA: Diagnosis not present

## 2011-10-02 DIAGNOSIS — Z7901 Long term (current) use of anticoagulants: Secondary | ICD-10-CM | POA: Diagnosis not present

## 2011-10-02 DIAGNOSIS — Z7982 Long term (current) use of aspirin: Secondary | ICD-10-CM | POA: Diagnosis not present

## 2011-10-02 DIAGNOSIS — Z48812 Encounter for surgical aftercare following surgery on the circulatory system: Secondary | ICD-10-CM | POA: Diagnosis not present

## 2011-10-05 ENCOUNTER — Ambulatory Visit (INDEPENDENT_AMBULATORY_CARE_PROVIDER_SITE_OTHER): Payer: Medicare Other | Admitting: *Deleted

## 2011-10-05 ENCOUNTER — Encounter: Payer: Self-pay | Admitting: Nurse Practitioner

## 2011-10-05 ENCOUNTER — Ambulatory Visit (INDEPENDENT_AMBULATORY_CARE_PROVIDER_SITE_OTHER): Payer: Medicare Other | Admitting: Nurse Practitioner

## 2011-10-05 VITALS — BP 140/72 | HR 86 | Ht 65.5 in | Wt 130.8 lb

## 2011-10-05 DIAGNOSIS — I359 Nonrheumatic aortic valve disorder, unspecified: Secondary | ICD-10-CM

## 2011-10-05 DIAGNOSIS — Z952 Presence of prosthetic heart valve: Secondary | ICD-10-CM

## 2011-10-05 DIAGNOSIS — Z954 Presence of other heart-valve replacement: Secondary | ICD-10-CM

## 2011-10-05 DIAGNOSIS — Z7901 Long term (current) use of anticoagulants: Secondary | ICD-10-CM

## 2011-10-05 LAB — BASIC METABOLIC PANEL
BUN: 16 mg/dL (ref 6–23)
CO2: 31 mEq/L (ref 19–32)
Calcium: 9.1 mg/dL (ref 8.4–10.5)
Chloride: 102 mEq/L (ref 96–112)
Creatinine, Ser: 0.7 mg/dL (ref 0.4–1.2)
GFR: 90.75 mL/min (ref >60.00)
Glucose, Bld: 82 mg/dL (ref 70–99)
Potassium: 4.8 mEq/L (ref 3.5–5.1)
Sodium: 138 mEq/L (ref 135–145)

## 2011-10-05 LAB — CBC WITH DIFFERENTIAL/PLATELET
Basophils Absolute: 0 10*3/uL (ref 0.0–0.1)
Basophils Relative: 0.4 % (ref 0.0–3.0)
Eosinophils Absolute: 0.5 10*3/uL (ref 0.0–0.7)
Eosinophils Relative: 7 % — ABNORMAL HIGH (ref 0.0–5.0)
HCT: 29.7 % — ABNORMAL LOW (ref 36.0–46.0)
Hemoglobin: 9.8 g/dL — ABNORMAL LOW (ref 12.0–15.0)
Lymphocytes Relative: 25.4 % (ref 12.0–46.0)
Lymphs Abs: 1.9 10*3/uL (ref 0.7–4.0)
MCHC: 32.9 g/dL (ref 30.0–36.0)
MCV: 100 fl (ref 78.0–100.0)
Monocytes Absolute: 0.6 10*3/uL (ref 0.1–1.0)
Monocytes Relative: 7.6 % (ref 3.0–12.0)
Neutro Abs: 4.5 10*3/uL (ref 1.4–7.7)
Neutrophils Relative %: 59.6 % (ref 43.0–77.0)
Platelets: 293 10*3/uL (ref 150.0–400.0)
RBC: 2.98 Mil/uL — ABNORMAL LOW (ref 3.87–5.11)
RDW: 16.6 % — ABNORMAL HIGH (ref 11.5–14.6)
WBC: 7.6 10*3/uL (ref 4.5–10.5)

## 2011-10-05 LAB — POCT INR: INR: 1.1

## 2011-10-05 NOTE — Assessment & Plan Note (Signed)
We have not received her records. We will have her sign another release. One message says she has had aortic root replacement with ascending hemi arch of the aorta and tricuspid valve repair. Regardless, she is doing well. We will be checking her INR and other labs today. I will have her see Dr. Sw[place]iland in 6 weeks. She is not interested in cardiac rehab. Patient is agreeable to this plan and will call if any problems develop in the interim.

## 2011-10-05 NOTE — Progress Notes (Signed)
[name] Chung Date of Birth: 1933/05/17 Medical Record #[id number]  History of Present Illness: [name] Chung is seen today for a post hospital visit. She is seen for Dr. [place]. She has had recent surgery at [facility]. We do not have any records. She is not really able to give specific details. One message noted reveals that she has had aortic root replacement with ascending hemi-arch aorta replacement and tricuspid valve repair. Her mechanical mitral valve remains in place. She remains on her coumadin. She has chronic atrial fib.   She comes in today. She seems to be doing pretty well. She is very upset that we do not have any records. She says she has not been released from their care. No follow up labs yet. She has a left groin incision as well as her sternotomy. No fever or chills.   Current Outpatient Prescriptions on File Prior to Visit  Medication Sig Dispense Refill  . Ascorbic Acid (VITAMIN C PO) Take by mouth once a week.       Marland Kitchen BETA CAROTENE PO Take by mouth daily.        . butalbital-acetaminophen-caffeine (FIORICET, ESGIC) 50-325-40 MG per tablet 1 tablet Twice daily.      . digoxin (LANOXIN) 0.25 MG tablet Take 375 mcg by mouth daily. 1 and 1/2 tablet daily      . estrogens, conjugated, (PREMARIN) 1.25 MG tablet Take 1.25 mg by mouth as needed.       . hydrochlorothiazide 50 MG tablet Take 50 mg by mouth daily.        Marland Kitchen KLOR-CON M10 10 MEQ tablet 2 tablets daily before breakfast.      . LORazepam (ATIVAN) 1 MG tablet Take 1 mg by mouth as needed.        . temazepam (RESTORIL) 30 MG capsule Take 30 mg by mouth daily.       . Warfarin Sodium (COUMADIN PO) Take 10 mg by mouth as directed.         Allergies  Allergen Reactions  . Asa Buff (Mag (Buffered Aspirin)   . Codeine   . Ultram (Tramadol Hcl)     Past Medical History  Diagnosis Date  . Valvular heart disease   . Chronic atrial fibrillation   . Chronic anticoagulation   . Rheumatic heart disease   . Aortic insufficiency    . Arthritis     Past Surgical History  Procedure Date  . Aortic valve replacement     1982, redo 1995 homograft  . Mitral valve replacement     1982, redo 1995 St Jude  valve  . Back surgery   . Abdominal hysterectomy   . Cataract extraction w/ intraocular lens  implant, bilateral     History  Smoking status  . Never Smoker   Smokeless tobacco  . Not on file    History  Alcohol Use No    Family History  Problem Relation Age of Onset  . Heart attack Mother   . Stroke Mother   . Diabetes Mother   . Other Father     CAR ACCIDENT    Review of Systems: The review of systems is per the HPI. She says she is not short of breath. Has more energy. Had multiple medicine changes.  Some bruising. Now on full dose of aspirin. All other systems were reviewed and are negative.  Physical Exam: Ht 5' 5.5" (1.664 m)  Wt 130 lb 12.8 oz (59.33 kg)  BMI 21.44 kg/m2 Patient is  in no acute distress. Skin is warm and dry. Color is normal.  HEENT is unremarkable. Normocephalic/atraumatic. PERRL. Sclera are nonicteric. Neck is supple. No masses. No JVD. Lungs are clear. Cardiac exam shows a fairly regular rate and rhythm but is probable atrial fib. Sternum is healing. Has left groin incision with hematoma. Abdomen is soft. Extremities are full but without significant edema. Has support stockings in place. Gait and ROM are intact. No gross neurologic deficits noted.  LABORATORY DATA: She refused an EKG.   Assessment / Plan:

## 2011-10-05 NOTE — Patient Instructions (Signed)
We are going to check some labs today.  I think you are doing well.  We will see you back in about 6 weeks.  Call the [facility] office at [phone] if you have any questions, problems or concerns.

## 2011-10-06 DIAGNOSIS — Z7901 Long term (current) use of anticoagulants: Secondary | ICD-10-CM | POA: Diagnosis not present

## 2011-10-06 DIAGNOSIS — Z48812 Encounter for surgical aftercare following surgery on the circulatory system: Secondary | ICD-10-CM | POA: Diagnosis not present

## 2011-10-06 DIAGNOSIS — Z7982 Long term (current) use of aspirin: Secondary | ICD-10-CM | POA: Diagnosis not present

## 2011-10-08 ENCOUNTER — Ambulatory Visit (INDEPENDENT_AMBULATORY_CARE_PROVIDER_SITE_OTHER): Payer: Medicare Other | Admitting: *Deleted

## 2011-10-08 DIAGNOSIS — I359 Nonrheumatic aortic valve disorder, unspecified: Secondary | ICD-10-CM | POA: Diagnosis not present

## 2011-10-08 DIAGNOSIS — Z7901 Long term (current) use of anticoagulants: Secondary | ICD-10-CM

## 2011-10-08 DIAGNOSIS — Z954 Presence of other heart-valve replacement: Secondary | ICD-10-CM

## 2011-10-09 DIAGNOSIS — Z7982 Long term (current) use of aspirin: Secondary | ICD-10-CM | POA: Diagnosis not present

## 2011-10-09 DIAGNOSIS — Z7901 Long term (current) use of anticoagulants: Secondary | ICD-10-CM | POA: Diagnosis not present

## 2011-10-09 DIAGNOSIS — Z48812 Encounter for surgical aftercare following surgery on the circulatory system: Secondary | ICD-10-CM | POA: Diagnosis not present

## 2011-10-13 DIAGNOSIS — I6350 Cerebral infarction due to unspecified occlusion or stenosis of unspecified cerebral artery: Secondary | ICD-10-CM | POA: Diagnosis not present

## 2011-10-13 DIAGNOSIS — I4891 Unspecified atrial fibrillation: Secondary | ICD-10-CM | POA: Diagnosis not present

## 2011-10-13 DIAGNOSIS — I099 Rheumatic heart disease, unspecified: Secondary | ICD-10-CM | POA: Diagnosis not present

## 2011-10-13 DIAGNOSIS — I6340 Cerebral infarction due to embolism of unspecified cerebral artery: Secondary | ICD-10-CM | POA: Diagnosis not present

## 2011-10-13 DIAGNOSIS — Z954 Presence of other heart-valve replacement: Secondary | ICD-10-CM | POA: Diagnosis not present

## 2011-10-13 DIAGNOSIS — Z7901 Long term (current) use of anticoagulants: Secondary | ICD-10-CM | POA: Diagnosis not present

## 2011-10-13 DIAGNOSIS — I359 Nonrheumatic aortic valve disorder, unspecified: Secondary | ICD-10-CM | POA: Diagnosis not present

## 2011-10-14 ENCOUNTER — Ambulatory Visit: Payer: Self-pay | Admitting: Internal Medicine

## 2011-10-14 DIAGNOSIS — Z954 Presence of other heart-valve replacement: Secondary | ICD-10-CM

## 2011-10-14 DIAGNOSIS — I359 Nonrheumatic aortic valve disorder, unspecified: Secondary | ICD-10-CM | POA: Diagnosis not present

## 2011-10-14 DIAGNOSIS — Z79899 Other long term (current) drug therapy: Secondary | ICD-10-CM | POA: Diagnosis not present

## 2011-10-14 DIAGNOSIS — Z7982 Long term (current) use of aspirin: Secondary | ICD-10-CM | POA: Diagnosis not present

## 2011-10-14 DIAGNOSIS — Z9889 Other specified postprocedural states: Secondary | ICD-10-CM | POA: Diagnosis not present

## 2011-10-14 DIAGNOSIS — Z7901 Long term (current) use of anticoagulants: Secondary | ICD-10-CM | POA: Diagnosis not present

## 2011-10-14 DIAGNOSIS — IMO0002 Reserved for concepts with insufficient information to code with codable children: Secondary | ICD-10-CM | POA: Diagnosis not present

## 2011-10-14 DIAGNOSIS — I898 Other specified noninfective disorders of lymphatic vessels and lymph nodes: Secondary | ICD-10-CM | POA: Diagnosis not present

## 2011-10-14 DIAGNOSIS — Z09 Encounter for follow-up examination after completed treatment for conditions other than malignant neoplasm: Secondary | ICD-10-CM | POA: Diagnosis not present

## 2011-10-20 ENCOUNTER — Other Ambulatory Visit (HOSPITAL_COMMUNITY): Payer: Medicare Other

## 2011-10-20 ENCOUNTER — Ambulatory Visit: Payer: Medicare Other | Admitting: Cardiology

## 2011-10-20 DIAGNOSIS — Z85828 Personal history of other malignant neoplasm of skin: Secondary | ICD-10-CM | POA: Diagnosis not present

## 2011-10-20 DIAGNOSIS — L570 Actinic keratosis: Secondary | ICD-10-CM | POA: Diagnosis not present

## 2011-10-20 DIAGNOSIS — C44721 Squamous cell carcinoma of skin of unspecified lower limb, including hip: Secondary | ICD-10-CM | POA: Diagnosis not present

## 2011-10-20 DIAGNOSIS — L821 Other seborrheic keratosis: Secondary | ICD-10-CM | POA: Diagnosis not present

## 2011-10-20 DIAGNOSIS — D239 Other benign neoplasm of skin, unspecified: Secondary | ICD-10-CM | POA: Diagnosis not present

## 2011-10-20 DIAGNOSIS — C44621 Squamous cell carcinoma of skin of unspecified upper limb, including shoulder: Secondary | ICD-10-CM | POA: Diagnosis not present

## 2011-10-27 ENCOUNTER — Ambulatory Visit (INDEPENDENT_AMBULATORY_CARE_PROVIDER_SITE_OTHER): Payer: Medicare Other | Admitting: *Deleted

## 2011-10-27 DIAGNOSIS — Z954 Presence of other heart-valve replacement: Secondary | ICD-10-CM

## 2011-10-27 DIAGNOSIS — I359 Nonrheumatic aortic valve disorder, unspecified: Secondary | ICD-10-CM

## 2011-10-27 DIAGNOSIS — Z7901 Long term (current) use of anticoagulants: Secondary | ICD-10-CM

## 2011-10-27 LAB — POCT INR: INR: 2.4

## 2011-10-28 DIAGNOSIS — I359 Nonrheumatic aortic valve disorder, unspecified: Secondary | ICD-10-CM | POA: Diagnosis not present

## 2011-10-28 DIAGNOSIS — I099 Rheumatic heart disease, unspecified: Secondary | ICD-10-CM | POA: Diagnosis not present

## 2011-10-28 DIAGNOSIS — Z954 Presence of other heart-valve replacement: Secondary | ICD-10-CM | POA: Diagnosis not present

## 2011-11-09 ENCOUNTER — Other Ambulatory Visit: Payer: Self-pay | Admitting: Cardiology

## 2011-11-09 MED ORDER — WARFARIN SODIUM 10 MG PO TABS: 10.0000 mg | ORAL_TABLET | ORAL | Status: DC

## 2011-11-09 MED ORDER — METOPROLOL TARTRATE 50 MG PO TABS: 75.0000 mg | ORAL_TABLET | Freq: Two times a day (BID) | ORAL | Status: DC

## 2011-11-09 MED ORDER — DIGOXIN 250 MCG PO TABS: ORAL_TABLET | ORAL | Status: DC

## 2011-11-09 NOTE — Telephone Encounter (Signed)
Warfarin 10mg  , digoxin.025 mg, metoprolol 50mg , mail order [facility], pt wants to confirm they have his correct fax number for [place]

## 2011-11-17 ENCOUNTER — Ambulatory Visit (INDEPENDENT_AMBULATORY_CARE_PROVIDER_SITE_OTHER): Payer: Medicare Other | Admitting: Cardiology

## 2011-11-17 ENCOUNTER — Ambulatory Visit (INDEPENDENT_AMBULATORY_CARE_PROVIDER_SITE_OTHER): Payer: Medicare Other | Admitting: *Deleted

## 2011-11-17 ENCOUNTER — Encounter: Payer: Self-pay | Admitting: Cardiology

## 2011-11-17 VITALS — BP 150/60 | HR 82 | Ht 65.0 in | Wt 128.4 lb

## 2011-11-17 DIAGNOSIS — I359 Nonrheumatic aortic valve disorder, unspecified: Secondary | ICD-10-CM

## 2011-11-17 DIAGNOSIS — Z7901 Long term (current) use of anticoagulants: Secondary | ICD-10-CM

## 2011-11-17 DIAGNOSIS — Z952 Presence of prosthetic heart valve: Secondary | ICD-10-CM

## 2011-11-17 DIAGNOSIS — Z954 Presence of other heart-valve replacement: Secondary | ICD-10-CM

## 2011-11-17 DIAGNOSIS — I482 Chronic atrial fibrillation, unspecified: Secondary | ICD-10-CM

## 2011-11-17 DIAGNOSIS — I38 Endocarditis, valve unspecified: Secondary | ICD-10-CM

## 2011-11-17 DIAGNOSIS — I4891 Unspecified atrial fibrillation: Secondary | ICD-10-CM

## 2011-11-17 LAB — POCT INR: INR: 2.2

## 2011-11-17 NOTE — Progress Notes (Signed)
HPI [name] is seen today for followup. She underwent redo open heart surgery in December at [facility]. This included redo aortic valve replacement with a Medtronic freestyle aortic root conduit. She also underwent tricuspid valve annuloplasty and repair. The mitral valve replacement was unchanged. She was hospitalized for 17 days. She has done well postoperatively. She currently denies any significant shortness of breath, chest pain, or palpitations. She is on Coumadin. She reports that she has had followup echocardiogram there. We still have no records from that admission.  Allergies  Allergen Reactions  . Asa Buff (Mag (Buffered Aspirin)   . Codeine   . Ultram (Tramadol Hcl)     Current Outpatient Prescriptions on File Prior to Visit  Medication Sig Dispense Refill  . aspirin 325 MG tablet Take 81 mg by mouth daily.       . butalbital-acetaminophen-caffeine (FIORICET, ESGIC) 50-325-40 MG per tablet 1 tablet 2 (two) times daily as needed.       . digoxin (LANOXIN) 0.25 MG tablet Take one and half tablet daily  135 tablet  3  . hydrochlorothiazide (HYDRODIURIL) 25 MG tablet Take 25 mg by mouth daily.        Marland Kitchen KLOR-CON M10 10 MEQ tablet 2 tablets daily before breakfast.      . LORazepam (ATIVAN) 1 MG tablet Take 1 mg by mouth as needed.        . metoprolol (LOPRESSOR) 50 MG tablet Take 1.5 tablets (75 mg total) by mouth 2 (two) times daily.  270 tablet  3  . temazepam (RESTORIL) 30 MG capsule Take 30 mg by mouth daily.       Marland Kitchen warfarin (COUMADIN) 10 MG tablet Take 1 tablet (10 mg total) by mouth as directed.  120 tablet  1    Past Medical History  Diagnosis Date  . Valvular heart disease   . Chronic atrial fibrillation   . Chronic anticoagulation   . Rheumatic heart disease   . Aortic insufficiency   . Arthritis     Past Surgical History  Procedure Date  . Aortic valve replacement     1982, redo 1995 homograft, redo 2012 Medronic valve conduit-freestyle  . Mitral  valve replacement     1982, redo 1995 St Jude  valve  . Back surgery   . Abdominal hysterectomy   . Cataract extraction w/ intraocular lens  implant, bilateral     Family History  Problem Relation Age of Onset  . Heart attack Mother   . Stroke Mother   . Diabetes Mother   . Other Father     CAR ACCIDENT    History   Social History  . Marital Status: Married    Spouse Name: N/A    Number of Children: 0  . Years of Education: N/A   Occupational History  . 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 Smoking status: Never Smoker   . Smokeless tobacco: Not on file  . Alcohol Use: No  . Drug Use: No  . Sexually Active: Not on file   Other Topics Concern  . Not on file   Social History Narrative  . No narrative on file    ROS  As noted in history of present illness. All other systems were reviewed and are negative.  PHYSICAL EXAM BP 150/60  Pulse 82  Ht 5\' 5"  (1.651 m)  Wt 128 lb 6.4 oz (58.242 kg)  BMI 21.37 kg/m2 She is an  elderly female in no acute distress. Pupils are equal round and reactive to light and accommodation. Sclera clear. Oropharynx is clear. Neck is supple no JVD, adenopathy, thyromegaly, or bruits. Carotid upstrokes are good. Lungs are clear. Cardiac exam reveals a good mechanical mitral valve click. There is a grade 2/6 systolic murmur heard best at the left sternal border. PMI is normal. There is no S3. Abdomen soft and nontender without masses or bruits. Femoral and pedal pulses are 2+ and symmetric. She has no edema. Skin is warm and dry. She is alert and oriented x3. Cranial nerves II through XII are intact.  Laboratory data: INR today is 2.2.   ASSESSMENT AND PLAN

## 2011-11-17 NOTE — Patient Instructions (Signed)
Continue your current medications except add ASA 81 mg daily.  We will get your records from Dr. [place] Crane.  I will see you back in 3 months.

## 2011-11-17 NOTE — Assessment & Plan Note (Signed)
Rate is well controlled on combination of digoxin and metoprolol. She is concerned that she is on a lower dose of digoxin now but it is because  we added metoprolol for rate control.

## 2011-11-17 NOTE — Assessment & Plan Note (Signed)
As noted in the overview she has now had 3 open-heart surgeries. She appears to be have made an excellent recovery. She states that she stop taking aspirin but I recommended that she continue taking 81 mg per day. Her Coumadin dose is adjusted today to try and achieve an INR between 2.5 and 3.5. We have requested records from her hospital stay. I will plan a followup again in 3 months.

## 2011-11-20 ENCOUNTER — Telehealth: Payer: Self-pay | Admitting: Cardiology

## 2011-11-20 NOTE — Telephone Encounter (Signed)
ROI faxed to [facility] @ [phone] 11/20/11/KM

## 2011-11-23 NOTE — Telephone Encounter (Signed)
Records Received From [facility], gave to [facility]  11/23/11/KM

## 2011-12-08 ENCOUNTER — Ambulatory Visit (INDEPENDENT_AMBULATORY_CARE_PROVIDER_SITE_OTHER): Payer: Medicare Other

## 2011-12-08 DIAGNOSIS — Z954 Presence of other heart-valve replacement: Secondary | ICD-10-CM

## 2011-12-08 DIAGNOSIS — Z7901 Long term (current) use of anticoagulants: Secondary | ICD-10-CM | POA: Diagnosis not present

## 2011-12-08 DIAGNOSIS — I359 Nonrheumatic aortic valve disorder, unspecified: Secondary | ICD-10-CM

## 2011-12-08 LAB — POCT INR: INR: 1.4

## 2011-12-10 ENCOUNTER — Telehealth: Payer: Self-pay | Admitting: Cardiology

## 2011-12-10 NOTE — Telephone Encounter (Signed)
Patient called stated her insurance has changed,and her mail order pharmacy has changed to [facility] fax # [phone].

## 2011-12-10 NOTE — Telephone Encounter (Signed)
Pt rtn [name] Chung's call, pls call

## 2011-12-22 ENCOUNTER — Ambulatory Visit (INDEPENDENT_AMBULATORY_CARE_PROVIDER_SITE_OTHER): Payer: Medicare Other | Admitting: *Deleted

## 2011-12-22 DIAGNOSIS — I359 Nonrheumatic aortic valve disorder, unspecified: Secondary | ICD-10-CM | POA: Diagnosis not present

## 2011-12-22 DIAGNOSIS — R609 Edema, unspecified: Secondary | ICD-10-CM | POA: Diagnosis not present

## 2011-12-22 DIAGNOSIS — Z954 Presence of other heart-valve replacement: Secondary | ICD-10-CM | POA: Diagnosis not present

## 2011-12-22 DIAGNOSIS — N951 Menopausal and female climacteric states: Secondary | ICD-10-CM | POA: Diagnosis not present

## 2011-12-22 DIAGNOSIS — Z79899 Other long term (current) drug therapy: Secondary | ICD-10-CM | POA: Diagnosis not present

## 2011-12-22 DIAGNOSIS — Z0000 Encounter for general adult medical examination without abnormal findings: Secondary | ICD-10-CM | POA: Diagnosis not present

## 2011-12-22 DIAGNOSIS — Z1331 Encounter for screening for depression: Secondary | ICD-10-CM | POA: Diagnosis not present

## 2011-12-22 DIAGNOSIS — Z7901 Long term (current) use of anticoagulants: Secondary | ICD-10-CM | POA: Diagnosis not present

## 2011-12-29 ENCOUNTER — Ambulatory Visit (INDEPENDENT_AMBULATORY_CARE_PROVIDER_SITE_OTHER): Payer: Medicare Other | Admitting: *Deleted

## 2011-12-29 DIAGNOSIS — Z7901 Long term (current) use of anticoagulants: Secondary | ICD-10-CM

## 2011-12-29 DIAGNOSIS — I359 Nonrheumatic aortic valve disorder, unspecified: Secondary | ICD-10-CM

## 2011-12-29 DIAGNOSIS — Z954 Presence of other heart-valve replacement: Secondary | ICD-10-CM | POA: Diagnosis not present

## 2011-12-29 LAB — POCT INR: INR: 2.7

## 2011-12-31 DIAGNOSIS — C44621 Squamous cell carcinoma of skin of unspecified upper limb, including shoulder: Secondary | ICD-10-CM | POA: Diagnosis not present

## 2011-12-31 DIAGNOSIS — L570 Actinic keratosis: Secondary | ICD-10-CM | POA: Diagnosis not present

## 2011-12-31 DIAGNOSIS — Z85828 Personal history of other malignant neoplasm of skin: Secondary | ICD-10-CM | POA: Diagnosis not present

## 2011-12-31 DIAGNOSIS — D0460 Carcinoma in situ of skin of unspecified upper limb, including shoulder: Secondary | ICD-10-CM | POA: Diagnosis not present

## 2012-01-05 DIAGNOSIS — Z780 Asymptomatic menopausal state: Secondary | ICD-10-CM | POA: Diagnosis not present

## 2012-01-18 ENCOUNTER — Ambulatory Visit (INDEPENDENT_AMBULATORY_CARE_PROVIDER_SITE_OTHER): Payer: Medicare Other | Admitting: Pharmacist

## 2012-01-18 DIAGNOSIS — Z7901 Long term (current) use of anticoagulants: Secondary | ICD-10-CM

## 2012-01-18 DIAGNOSIS — Z954 Presence of other heart-valve replacement: Secondary | ICD-10-CM | POA: Diagnosis not present

## 2012-01-18 DIAGNOSIS — I359 Nonrheumatic aortic valve disorder, unspecified: Secondary | ICD-10-CM | POA: Diagnosis not present

## 2012-01-18 LAB — POCT INR: INR: 1

## 2012-01-21 ENCOUNTER — Telehealth: Payer: Self-pay | Admitting: Cardiology

## 2012-01-21 NOTE — Telephone Encounter (Signed)
error 

## 2012-01-22 ENCOUNTER — Other Ambulatory Visit: Payer: Self-pay | Admitting: Cardiology

## 2012-01-22 MED ORDER — WARFARIN SODIUM 10 MG PO TABS: 10.0000 mg | ORAL_TABLET | ORAL | Status: DC

## 2012-01-22 MED ORDER — METOPROLOL TARTRATE 50 MG PO TABS: 75.0000 mg | ORAL_TABLET | Freq: Two times a day (BID) | ORAL | Status: DC

## 2012-01-22 MED ORDER — DIGOXIN 250 MCG PO TABS: 125.0000 ug | ORAL_TABLET | Freq: Every day | ORAL | Status: DC

## 2012-01-22 NOTE — Telephone Encounter (Signed)
Pt is out of pills please let her know when complete

## 2012-01-26 ENCOUNTER — Ambulatory Visit (INDEPENDENT_AMBULATORY_CARE_PROVIDER_SITE_OTHER): Payer: Medicare Other | Admitting: Pharmacist

## 2012-01-26 DIAGNOSIS — Z7901 Long term (current) use of anticoagulants: Secondary | ICD-10-CM | POA: Diagnosis not present

## 2012-01-26 DIAGNOSIS — Z954 Presence of other heart-valve replacement: Secondary | ICD-10-CM

## 2012-01-26 DIAGNOSIS — I359 Nonrheumatic aortic valve disorder, unspecified: Secondary | ICD-10-CM | POA: Diagnosis not present

## 2012-02-16 ENCOUNTER — Ambulatory Visit (INDEPENDENT_AMBULATORY_CARE_PROVIDER_SITE_OTHER): Payer: Medicare Other

## 2012-02-16 DIAGNOSIS — Z7901 Long term (current) use of anticoagulants: Secondary | ICD-10-CM | POA: Diagnosis not present

## 2012-02-16 DIAGNOSIS — M810 Age-related osteoporosis without current pathological fracture: Secondary | ICD-10-CM | POA: Diagnosis not present

## 2012-02-16 DIAGNOSIS — Z954 Presence of other heart-valve replacement: Secondary | ICD-10-CM | POA: Diagnosis not present

## 2012-02-16 DIAGNOSIS — E7800 Pure hypercholesterolemia, unspecified: Secondary | ICD-10-CM | POA: Diagnosis not present

## 2012-02-16 DIAGNOSIS — I359 Nonrheumatic aortic valve disorder, unspecified: Secondary | ICD-10-CM

## 2012-03-01 ENCOUNTER — Ambulatory Visit (INDEPENDENT_AMBULATORY_CARE_PROVIDER_SITE_OTHER): Payer: Medicare Other | Admitting: Cardiology

## 2012-03-01 ENCOUNTER — Encounter: Payer: Self-pay | Admitting: Cardiology

## 2012-03-01 VITALS — BP 140/67 | HR 70 | Ht 65.0 in | Wt 136.0 lb

## 2012-03-01 DIAGNOSIS — Z7901 Long term (current) use of anticoagulants: Secondary | ICD-10-CM

## 2012-03-01 DIAGNOSIS — Z954 Presence of other heart-valve replacement: Secondary | ICD-10-CM | POA: Diagnosis not present

## 2012-03-01 DIAGNOSIS — I099 Rheumatic heart disease, unspecified: Secondary | ICD-10-CM | POA: Diagnosis not present

## 2012-03-01 DIAGNOSIS — Z952 Presence of prosthetic heart valve: Secondary | ICD-10-CM

## 2012-03-01 DIAGNOSIS — I38 Endocarditis, valve unspecified: Secondary | ICD-10-CM | POA: Diagnosis not present

## 2012-03-01 DIAGNOSIS — I482 Chronic atrial fibrillation, unspecified: Secondary | ICD-10-CM

## 2012-03-01 DIAGNOSIS — R030 Elevated blood-pressure reading, without diagnosis of hypertension: Secondary | ICD-10-CM

## 2012-03-01 DIAGNOSIS — IMO0001 Reserved for inherently not codable concepts without codable children: Secondary | ICD-10-CM

## 2012-03-01 DIAGNOSIS — I4891 Unspecified atrial fibrillation: Secondary | ICD-10-CM

## 2012-03-01 NOTE — Progress Notes (Signed)
HPI [name] Chung is seen today for followup. She underwent redo open heart surgery in December at [facility]. This included redo aortic valve replacement with a Medtronic freestyle aortic root conduit. She also underwent tricuspid valve annuloplasty and repair. The mitral valve replacement was unchanged. She had initial surgery for rheumatic heart disease in 1982 with both aortic and mitral valve replacements with Bjork Shiley valves. She had repeat surgery in 1995 with a homograft aortic valve and a St. Jude mechanical mitral valve replacement. On followup today she currently denies any significant shortness of breath, chest pain, or palpitations. She is on Coumadin. She reports that she has had followup echocardiogram at [facility] in January. She also had evidence of a small stroke on CT scan. Her recent INR was 2.9. She complains that when her INR is this high she has increased bruising. She has been walking on her treadmill 5 days a week up to 3.3 miles per hour.  Allergies  Allergen Reactions  . Asa Buff (Mag (Buffered Aspirin)   . Codeine   . Ultram (Tramadol Hcl)     Current Outpatient Prescriptions on File Prior to Visit  Medication Sig Dispense Refill  . aspirin 81 MG tablet Take 81 mg by mouth daily.      . butalbital-acetaminophen-caffeine (FIORICET, ESGIC) 50-325-40 MG per tablet 1 tablet 2 (two) times daily as needed.       . digoxin (LANOXIN) 0.25 MG tablet Take 0.5 tablets (125 mcg total) by mouth daily. Take 1/2 tablet daily  90 tablet  3  . hydrochlorothiazide (HYDRODIURIL) 25 MG tablet Take 25 mg by mouth daily.       Marland Kitchen KLOR-CON M10 10 MEQ tablet Take 10 mEq by mouth 2 (two) times daily.       Marland Kitchen LORazepam (ATIVAN) 1 MG tablet Take 1 mg by mouth as needed.        . metoprolol (LOPRESSOR) 50 MG tablet Take 1.5 tablets (75 mg total) by mouth 2 (two) times daily.  270 tablet  3  . temazepam (RESTORIL) 30 MG capsule Take 30 mg by mouth daily.       Marland Kitchen warfarin (COUMADIN)  10 MG tablet Take 1 tablet (10 mg total) by mouth as directed. Take as directed by coumadin clinic, this is a 90 supply  145 tablet  1    Past Medical History  Diagnosis Date  . Valvular heart disease   . Chronic atrial fibrillation   . Chronic anticoagulation   . Rheumatic heart disease   . Aortic insufficiency   . Arthritis     Past Surgical History  Procedure Date  . Aortic valve replacement     1982, redo 1995 homograft, redo 2012 Medronic valve conduit-freestyle  . Mitral valve replacement     1982, redo 1995 St Jude  valve  . Back surgery   . Abdominal hysterectomy   . Cataract extraction w/ intraocular lens  implant, bilateral     Family History  Problem Relation Age of Onset  . Heart attack Mother   . Stroke Mother   . Diabetes Mother   . Other Father     CAR ACCIDENT    History   Social History  . Marital Status: Married    Spouse Name: N/A    Number of Children: 0  . Years of Education: N/A   Occupational History  . 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 Smoking status: Never Smoker   .  Smokeless tobacco: Not on file  . Alcohol Use: No  . Drug Use: No  . Sexually Active: Not on file   Other Topics Concern  . Not on file   Social History Narrative  . No narrative on file    ROS  As noted in history of present illness. All other systems were reviewed and are negative.  PHYSICAL EXAM BP 140/67  Pulse 70  Ht 5\' 5"  (1.651 m)  Wt 136 lb (61.689 kg)  BMI 22.63 kg/m2 She is an elderly female in no acute distress. Pupils are equal round and reactive to light and accommodation. Sclera clear. Oropharynx is clear. Neck is supple no JVD, adenopathy, thyromegaly, or bruits. Carotid upstrokes are good. Lungs are clear. Cardiac exam reveals a good mechanical mitral valve click. There is a grade 2/6 systolic murmur heard best at the left sternal border. There is no diastolic murmur. PMI is normal. There is no S3. Abdomen soft and  nontender without masses or bruits. Femoral and pedal pulses are 2+ and symmetric. She has no edema. Skin is warm and dry. She is alert and oriented x3. Cranial nerves II through XII are intact.  Laboratory data:   ASSESSMENT AND PLAN

## 2012-03-01 NOTE — Patient Instructions (Signed)
Continue your current medication.  I will see you again in 6 months.   

## 2012-03-01 NOTE — Assessment & Plan Note (Signed)
[name] Chung is doing very well since her third open heart surgery. She has made an excellent recovery. I stressed the importance of maintaining her INR in a therapeutic range especially given evidence of prior stroke. We will continue with her current therapy and plan to followup again in 6 months.

## 2012-03-01 NOTE — Assessment & Plan Note (Signed)
Rate is well-controlled. She is on anticoagulation. She is asymptomatic.

## 2012-03-01 NOTE — Assessment & Plan Note (Signed)
Blood pressure control is acceptable today on HCTZ and metoprolol.

## 2012-03-15 ENCOUNTER — Ambulatory Visit (INDEPENDENT_AMBULATORY_CARE_PROVIDER_SITE_OTHER): Payer: Medicare Other | Admitting: Pharmacist

## 2012-03-15 DIAGNOSIS — I359 Nonrheumatic aortic valve disorder, unspecified: Secondary | ICD-10-CM

## 2012-03-15 DIAGNOSIS — Z7901 Long term (current) use of anticoagulants: Secondary | ICD-10-CM

## 2012-03-15 DIAGNOSIS — Z954 Presence of other heart-valve replacement: Secondary | ICD-10-CM

## 2012-03-29 DIAGNOSIS — Z1231 Encounter for screening mammogram for malignant neoplasm of breast: Secondary | ICD-10-CM | POA: Diagnosis not present

## 2012-04-12 ENCOUNTER — Ambulatory Visit (INDEPENDENT_AMBULATORY_CARE_PROVIDER_SITE_OTHER): Payer: Medicare Other | Admitting: *Deleted

## 2012-04-12 DIAGNOSIS — I359 Nonrheumatic aortic valve disorder, unspecified: Secondary | ICD-10-CM | POA: Diagnosis not present

## 2012-04-12 DIAGNOSIS — Z7901 Long term (current) use of anticoagulants: Secondary | ICD-10-CM | POA: Diagnosis not present

## 2012-04-12 DIAGNOSIS — Z954 Presence of other heart-valve replacement: Secondary | ICD-10-CM | POA: Diagnosis not present

## 2012-05-10 ENCOUNTER — Ambulatory Visit (INDEPENDENT_AMBULATORY_CARE_PROVIDER_SITE_OTHER): Payer: Medicare Other | Admitting: *Deleted

## 2012-05-10 DIAGNOSIS — Z954 Presence of other heart-valve replacement: Secondary | ICD-10-CM

## 2012-05-10 DIAGNOSIS — Z7901 Long term (current) use of anticoagulants: Secondary | ICD-10-CM

## 2012-05-10 DIAGNOSIS — I359 Nonrheumatic aortic valve disorder, unspecified: Secondary | ICD-10-CM

## 2012-06-07 ENCOUNTER — Ambulatory Visit (INDEPENDENT_AMBULATORY_CARE_PROVIDER_SITE_OTHER): Payer: Medicare Other | Admitting: *Deleted

## 2012-06-07 ENCOUNTER — Telehealth: Payer: Self-pay | Admitting: Cardiology

## 2012-06-07 DIAGNOSIS — Z7901 Long term (current) use of anticoagulants: Secondary | ICD-10-CM

## 2012-06-07 DIAGNOSIS — I359 Nonrheumatic aortic valve disorder, unspecified: Secondary | ICD-10-CM | POA: Diagnosis not present

## 2012-06-07 DIAGNOSIS — Z954 Presence of other heart-valve replacement: Secondary | ICD-10-CM

## 2012-06-07 LAB — POCT INR: INR: 2.4

## 2012-06-07 NOTE — Telephone Encounter (Signed)
New Problem:    Patient called in displease that you did not give her a call back to schedule her appointment in November.  Appointment has already been scheduled, please call back if you need to.

## 2012-06-07 NOTE — Telephone Encounter (Signed)
Patient called no answer.LMTC. 

## 2012-06-08 NOTE — Telephone Encounter (Signed)
Spoke to patient she stated she is not happy the way appointments are scheduled.Stated she saw Dr.Jordan 03/01/12 and was told someone would call her for appointment in 6 months.

## 2012-06-09 NOTE — Telephone Encounter (Signed)
Patient called was told her complaints about the office was given to our nurse manger.Patient stated she has appointment with Dr.Jordan 08/09/12.Advised to call earlier if needed.

## 2012-06-28 DIAGNOSIS — Z79899 Other long term (current) drug therapy: Secondary | ICD-10-CM | POA: Diagnosis not present

## 2012-06-28 DIAGNOSIS — R5383 Other fatigue: Secondary | ICD-10-CM | POA: Diagnosis not present

## 2012-06-28 DIAGNOSIS — I4891 Unspecified atrial fibrillation: Secondary | ICD-10-CM | POA: Diagnosis not present

## 2012-07-05 ENCOUNTER — Ambulatory Visit (INDEPENDENT_AMBULATORY_CARE_PROVIDER_SITE_OTHER): Payer: Medicare Other | Admitting: *Deleted

## 2012-07-05 DIAGNOSIS — Z954 Presence of other heart-valve replacement: Secondary | ICD-10-CM | POA: Diagnosis not present

## 2012-07-05 DIAGNOSIS — Z7901 Long term (current) use of anticoagulants: Secondary | ICD-10-CM

## 2012-07-05 DIAGNOSIS — I359 Nonrheumatic aortic valve disorder, unspecified: Secondary | ICD-10-CM

## 2012-08-09 ENCOUNTER — Ambulatory Visit (INDEPENDENT_AMBULATORY_CARE_PROVIDER_SITE_OTHER): Payer: Medicare Other | Admitting: *Deleted

## 2012-08-09 ENCOUNTER — Ambulatory Visit (INDEPENDENT_AMBULATORY_CARE_PROVIDER_SITE_OTHER): Payer: Medicare Other | Admitting: Cardiology

## 2012-08-09 ENCOUNTER — Encounter: Payer: Self-pay | Admitting: Cardiology

## 2012-08-09 VITALS — BP 115/55 | HR 56 | Ht 65.0 in | Wt 142.4 lb

## 2012-08-09 DIAGNOSIS — I4891 Unspecified atrial fibrillation: Secondary | ICD-10-CM

## 2012-08-09 DIAGNOSIS — Z7901 Long term (current) use of anticoagulants: Secondary | ICD-10-CM

## 2012-08-09 DIAGNOSIS — I482 Chronic atrial fibrillation, unspecified: Secondary | ICD-10-CM

## 2012-08-09 DIAGNOSIS — I359 Nonrheumatic aortic valve disorder, unspecified: Secondary | ICD-10-CM | POA: Diagnosis not present

## 2012-08-09 DIAGNOSIS — I38 Endocarditis, valve unspecified: Secondary | ICD-10-CM | POA: Diagnosis not present

## 2012-08-09 DIAGNOSIS — Z954 Presence of other heart-valve replacement: Secondary | ICD-10-CM

## 2012-08-09 LAB — POCT INR: INR: 3.3

## 2012-08-09 MED ORDER — METOPROLOL TARTRATE 50 MG PO TABS: 50.0000 mg | ORAL_TABLET | Freq: Two times a day (BID) | ORAL | Status: DC

## 2012-08-09 MED ORDER — HYDROCHLOROTHIAZIDE 50 MG PO TABS: 50.0000 mg | ORAL_TABLET | Freq: Every day | ORAL | Status: DC

## 2012-08-09 NOTE — Patient Instructions (Signed)
Reduce metoprolol to 50 mg twice a day.  Increase HCTZ to 50 mg daily.  I will see you again in 3 months with lab work.

## 2012-08-09 NOTE — Progress Notes (Signed)
HPI [name] Chung is seen today for followup. She underwent redo open heart surgery in December 2012 at [facility]. This included redo aortic valve replacement with a Medtronic freestyle aortic root conduit. She also underwent tricuspid valve annuloplasty and repair. The mitral valve replacement was unchanged. She had initial surgery for rheumatic heart disease in 1982 with both aortic and mitral valve replacements with Bjork Shiley valves. She had repeat surgery in 1995 with a homograft aortic valve and a St. Jude mechanical mitral valve replacement.  On followup today she complains of increased weight gain and swelling. She states she feels bloated and tight. She is concerned that her HCTZ was reduced from 50 to 25 mg 4 months ago. She is also concerned about the fact that her digoxin dose was lower than it has been in the past. From what I can tell her dose was reduced at the time of her valve surgery and she was also started on metoprolol. She has started wearing support stockings. She denies any dizziness or syncope. She has no shortness of breath.  Allergies  Allergen Reactions  . Asa Buff (Mag (Buffered Aspirin)   . Codeine   . Ultram (Tramadol Hcl)     Current Outpatient Prescriptions on File Prior to Visit  Medication Sig Dispense Refill  . aspirin 81 MG tablet Take 81 mg by mouth daily.      . butalbital-acetaminophen-caffeine (FIORICET, ESGIC) 50-325-40 MG per tablet 1 tablet 2 (two) times daily as needed.       . digoxin (LANOXIN) 0.25 MG tablet Take 0.5 tablets (125 mcg total) by mouth daily. Take 1/2 tablet daily  90 tablet  3  . KLOR-CON M10 10 MEQ tablet Take 10 mEq by mouth 2 (two) times daily.       Marland Kitchen LORazepam (ATIVAN) 1 MG tablet Take 1 mg by mouth as needed.        . temazepam (RESTORIL) 30 MG capsule Take 30 mg by mouth daily.       Marland Kitchen warfarin (COUMADIN) 10 MG tablet Take 1 tablet (10 mg total) by mouth as directed. Take as directed by coumadin clinic, this is a 90  supply  145 tablet  1  . [DISCONTINUED] metoprolol (LOPRESSOR) 50 MG tablet Take 1.5 tablets (75 mg total) by mouth 2 (two) times daily.  270 tablet  3    Past Medical History  Diagnosis Date  . Valvular heart disease   . Chronic atrial fibrillation   . Chronic anticoagulation   . Rheumatic heart disease   . Aortic insufficiency   . Arthritis     Past Surgical History  Procedure Date  . Aortic valve replacement     1982, redo 1995 homograft, redo 2012 Medronic valve conduit-freestyle  . Mitral valve replacement     1982, redo 1995 St Jude  valve  . Back surgery   . Abdominal hysterectomy   . Cataract extraction w/ intraocular lens  implant, bilateral     Family History  Problem Relation Age of Onset  . Heart attack Mother   . Stroke Mother   . Diabetes Mother   . Other Father     CAR ACCIDENT    History   Social History  . Marital Status: Married    Spouse Name: N/A    Number of Children: 0  . Years of Education: N/A   Occupational History  . 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 Smoking status:  Never Smoker   . Smokeless tobacco: Not on file  . Alcohol Use: No  . Drug Use: No  . Sexually Active: Not on file   Other Topics Concern  . Not on file   Social History Narrative  . No narrative on file    ROS  As noted in history of present illness. All other systems were reviewed and are negative.  PHYSICAL EXAM BP 115/55  Pulse 56  Ht 5\' 5"  (1.651 m)  Wt 64.592 kg (142 lb 6.4 oz)  BMI 23.70 kg/m2 She is an elderly female in no acute distress. Pupils are equal round and reactive to light and accommodation. Sclera clear. Oropharynx is clear. Neck is supple no JVD, adenopathy, thyromegaly, or bruits. Carotid upstrokes are good. Lungs are clear. Cardiac exam reveals a good mechanical mitral valve click. There is a grade 2/6 systolic murmur heard best at the left sternal border. This radiates into the carotids. There is no diastolic  murmur. PMI is normal. There is no S3. Abdomen soft and nontender without masses or bruits. Femoral and pedal pulses are 2+ and symmetric. She has 1+ edema. Skin is warm and dry. She is alert and oriented x3. Cranial nerves II through XII are intact.  Laboratory data:  ECG today demonstrates atrial fibrillation with a slow ventricular response and competing junctional rhythm. Rate is 56 beats per minute. She has ST-T wave changes consistent with dig effect.  ASSESSMENT AND PLAN  1. Valvular heart disease status post redo aortic valve replacement x3 and redo mitral valve surgery x2. Valve function is good by exam.he is therapeutic on her anticoagulation with an INR of 3.3.  2. Chronic anticoagulation.  3. Edema. We will increase her HCTZ back to 50 mg per day. Renal indices have been normal. I'll followup again in 3 months and we'll check a basic metabolic panel and dig level at that time.   4. Permanent atrial fibrillation. Rate is slow with a competing junctional rhythm. We will reduce metoprolol to 50 mg twice a day. Continue lower dig dose.

## 2012-08-17 ENCOUNTER — Telehealth: Payer: Self-pay | Admitting: Cardiology

## 2012-08-17 NOTE — Telephone Encounter (Signed)
Mrs Wootan is concerned about her husband's zetia.  She states she was told that he needs a follow up for further refills. She states it is difficult to get him in to be seen and wants to know if he can be seen at the same time as her appt in Feb.  I scheduled him for an appt in Feb and refilled his zetia to last until his appt.  Please advise him as to whether or not he needs labs prior to his appt in Feb.

## 2012-08-17 NOTE — Telephone Encounter (Signed)
Pt calling re medication, has to go out and would like a call before 12p

## 2012-08-17 NOTE — Telephone Encounter (Signed)
Spoke with patient she stated she made husband's appointment to see Dr.Jordan 11/15/12.States her husband has short term memory loss.States they got off track with appointments last year and was trying to get organized.States husband needs lab work done.Appointment scheduled to have fasting bmet,lipids,hepatic panels tomorrow 08/18/12.

## 2012-09-12 ENCOUNTER — Telehealth: Payer: Self-pay | Admitting: Cardiology

## 2012-09-12 NOTE — Telephone Encounter (Signed)
plz return call to pt [phone] regarding medication

## 2012-09-12 NOTE — Telephone Encounter (Signed)
Spoke to patient she stated her husband [name] Chung needs refill on crestor and ramipril.Prescriptions will sent to [facility] [address].

## 2012-09-13 ENCOUNTER — Ambulatory Visit (INDEPENDENT_AMBULATORY_CARE_PROVIDER_SITE_OTHER): Payer: Medicare Other | Admitting: *Deleted

## 2012-09-13 ENCOUNTER — Other Ambulatory Visit: Payer: Self-pay

## 2012-09-13 DIAGNOSIS — I359 Nonrheumatic aortic valve disorder, unspecified: Secondary | ICD-10-CM

## 2012-09-13 DIAGNOSIS — Z7901 Long term (current) use of anticoagulants: Secondary | ICD-10-CM

## 2012-09-13 DIAGNOSIS — Z954 Presence of other heart-valve replacement: Secondary | ICD-10-CM | POA: Diagnosis not present

## 2012-09-13 LAB — POCT INR: INR: 2.8

## 2012-09-13 MED ORDER — METOPROLOL TARTRATE 50 MG PO TABS: 50.0000 mg | ORAL_TABLET | Freq: Two times a day (BID) | ORAL | Status: DC

## 2012-10-04 ENCOUNTER — Telehealth: Payer: Self-pay

## 2012-10-04 ENCOUNTER — Other Ambulatory Visit: Payer: Self-pay | Admitting: *Deleted

## 2012-10-04 MED ORDER — HYDROCHLOROTHIAZIDE 50 MG PO TABS: 50.0000 mg | ORAL_TABLET | Freq: Every day | ORAL | Status: DC

## 2012-10-04 MED ORDER — METOPROLOL TARTRATE 50 MG PO TABS: 50.0000 mg | ORAL_TABLET | Freq: Two times a day (BID) | ORAL | Status: DC

## 2012-10-04 MED ORDER — WARFARIN SODIUM 10 MG PO TABS: 10.0000 mg | ORAL_TABLET | ORAL | Status: DC

## 2012-10-04 MED ORDER — DIGOXIN 125 MCG PO TABS: 0.1250 mg | ORAL_TABLET | Freq: Every day | ORAL | Status: DC

## 2012-10-04 NOTE — Telephone Encounter (Signed)
Patient called was told digoxin 0.125 mg daily,hct 50 mg daily, metoprolol tart 50 mg twice daily # 90 day prescriptions called to new pharmacy [facility] [phone].

## 2012-10-19 DIAGNOSIS — I359 Nonrheumatic aortic valve disorder, unspecified: Secondary | ICD-10-CM | POA: Diagnosis not present

## 2012-10-19 DIAGNOSIS — Z954 Presence of other heart-valve replacement: Secondary | ICD-10-CM | POA: Diagnosis not present

## 2012-10-19 DIAGNOSIS — Z48298 Encounter for aftercare following other organ transplant: Secondary | ICD-10-CM | POA: Diagnosis not present

## 2012-10-19 DIAGNOSIS — I369 Nonrheumatic tricuspid valve disorder, unspecified: Secondary | ICD-10-CM | POA: Diagnosis not present

## 2012-10-25 ENCOUNTER — Ambulatory Visit (INDEPENDENT_AMBULATORY_CARE_PROVIDER_SITE_OTHER): Payer: Medicare Other | Admitting: *Deleted

## 2012-10-25 DIAGNOSIS — I359 Nonrheumatic aortic valve disorder, unspecified: Secondary | ICD-10-CM

## 2012-10-25 DIAGNOSIS — Z7901 Long term (current) use of anticoagulants: Secondary | ICD-10-CM

## 2012-10-25 DIAGNOSIS — Z954 Presence of other heart-valve replacement: Secondary | ICD-10-CM | POA: Diagnosis not present

## 2012-11-03 ENCOUNTER — Encounter: Payer: Self-pay | Admitting: Cardiology

## 2012-11-03 DIAGNOSIS — H04129 Dry eye syndrome of unspecified lacrimal gland: Secondary | ICD-10-CM | POA: Diagnosis not present

## 2012-11-07 ENCOUNTER — Telehealth: Payer: Self-pay | Admitting: Cardiology

## 2012-11-07 NOTE — Telephone Encounter (Signed)
New Problem: ° ° ° °Patient called in wanting to speak with you.  Please call back. °

## 2012-11-07 NOTE — Telephone Encounter (Signed)
Spoke to patient she stated she received letter Saturday about changing husband's appointment 11/15/12.Patient stated she has appointment too that day and she did not receive a letter.Patient was told Dr.Jordan will not be in office 11/15/12 and she will need to reschedule both appointments.

## 2012-11-15 ENCOUNTER — Ambulatory Visit: Payer: Medicare Other | Admitting: Cardiology

## 2012-11-15 ENCOUNTER — Other Ambulatory Visit: Payer: Medicare Other

## 2012-11-16 ENCOUNTER — Ambulatory Visit (INDEPENDENT_AMBULATORY_CARE_PROVIDER_SITE_OTHER): Payer: Medicare Other | Admitting: Family Medicine

## 2012-11-16 DIAGNOSIS — R040 Epistaxis: Secondary | ICD-10-CM | POA: Diagnosis not present

## 2012-11-16 DIAGNOSIS — T148XXA Other injury of unspecified body region, initial encounter: Secondary | ICD-10-CM

## 2012-11-16 NOTE — Progress Notes (Signed)
[facility]:  Office Visit  Chief Complaint: No chief complaint on file.   HPI: [name] Chung is a 79 y.o. female who complains of  Nose bleed x 2 episodes in last week, is on coumadin for A. Fib and mechanical valve replacement. Last INR was 3.0. She states that it took 4 hrs to stop the nose bleeds on "Sunday and yesterday. Also started having right forearm wound that started bleeding spontaneously, she does not remember hitting her arm or injuring it. Started today ad won't stop bleeding. She denies and rectal bleeding and denies any gum bleeding. She denies and CP, HA, SOB, dizziness.   Past Medical History  Diagnosis Date  . Valvular heart disease   . Chronic atrial fibrillation   . Chronic anticoagulation   . Rheumatic heart disease   . Aortic insufficiency   . Arthritis    Past Surgical History  Procedure Laterality Date  . Aortic valve replacement      1982, redo 1995 homograft, redo 2012 Medronic valve conduit-freestyle  . Mitral valve replacement      19" 82, redo 1995 St Jude  valve  . Back surgery    . Abdominal hysterectomy    . Cataract extraction w/ intraocular lens  implant, bilateral     History   Social History  . Marital Status: Married    Spouse Name: N/A    Number of Children: 0  . Years of Education: N/A   Occupational History  . 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 Smoking status: Never Smoker   . Smokeless tobacco: Not on file  . Alcohol Use: No  . Drug Use: No  . Sexually Active: Not on file   Other Topics Concern  . Not on file   Social History Narrative  . No narrative on file   Family History  Problem Relation Age of Onset  . Heart attack Mother   . Stroke Mother   . Diabetes Mother   . Other Father     CAR ACCIDENT   Allergies  Allergen Reactions  . Asa Buff (Mag (Buffered Aspirin)   . Codeine   . Ultram (Tramadol Hcl)    Prior to Admission medications   Medication Sig Start Date End  Date Taking? Authorizing Provider  aspirin 81 MG tablet Take 81 mg by mouth daily.    Historical Provider, MD  butalbital-acetaminophen-caffeine (FIORICET, ESGIC) 50-325-40 MG per tablet 1 tablet 2 (two) times daily as needed.  12/08/10   Historical Provider, MD  digoxin (LANOXIN) 0.125 MG tablet Take 1 tablet (0.125 mg total) by mouth daily. 10/04/12   Peter M [place], MD  hydrochlorothiazide (HYDRODIURIL) 50 MG tablet Take 1 tablet (50 mg total) by mouth daily. 10/04/12   Peter M [place], MD  LORazepam (ATIVAN) 1 MG tablet Take 1 mg by mouth as needed.      Historical Provider, MD  metoprolol (LOPRESSOR) 50 MG tablet Take 1 tablet (50 mg total) by mouth 2 (two) times daily. 10/04/12   Peter M [place], MD  potassium chloride (K-DUR) 10 MEQ tablet Take 10 mEq by mouth 2 (two) times daily.    Historical Provider, MD  temazepam (RESTORIL) 30 MG capsule Take 30 mg by mouth daily.     Historical Provider, MD  warfarin (COUMADIN) 10 MG tablet Take 1 tablet (10 mg total) by mouth as directed. Take as directed by coumadin clinic, this is a 90 supply 10/04/12  Peter M [place], MD     ROS: The patient denies fevers, chills, night sweats, unintentional weight loss, chest pain, palpitations, wheezing, dyspnea on exertion, nausea, vomiting, abdominal pain, dysuria, hematuria, melena, numbness, weakness, or tingling.   All other systems have been reviewed and were otherwise negative with the exception of those mentioned in the HPI and as above.    PHYSICAL EXAM: Please see paper OV for vitals There were no vitals filed for this visit. There were no vitals filed for this visit. There is no weight on file to calculate BMI.  General: Alert, no acute distress HEENT:  Normocephalic, atraumatic, oropharynx patent. + slight erythema, right nasal mucosa has some borken vessels, No clots.NO exudates. No posterior bleeding in OP, No bleeding gums.  Cardiovascular:  Regular rate and rhythm, no rubs, +  Systolic click/  murmurs , no gallops.  No Carotid bruits, radial pulse intact. No pedal edema.  Respiratory: Clear to auscultation bilaterally.  No wheezes, rales, or rhonchi.  No cyanosis, no use of accessory musculature GI: No organomegaly, abdomen is soft and non-tender, positive bowel sounds.  No masses. Skin: + v shaped 1 inch laceration/superficial skintear on right forearm, bleeding even after 20 min of compressionin office Neurologic: Facial musculature symmetric. Psychiatric: Patient is appropriate throughout our interaction. Lymphatic: No cervical lymphadenopathy Musculoskeletal: Gait intact.   LABS: Results for orders placed in visit on 10/25/12  POCT INR      Result Value Range   INR 3.0       EKG/XRAY:   Primary read interpreted by Dr. Conley Rolls at [facility].   ASSESSMENT/PLAN: Encounter Diagnoses  Name Primary?  Marland Kitchen Nontraumatic tear of skin Yes  . Epistaxis    Packed nose with lidocaine with epi ( She really wanted it cuaterized but this would have most likely caused more bleeding, she was currently not bleding in the office so I recommened just packing with lidocaine and epi and see how she does, we did ths twice. Advise to keep nose lubricated with vasoline, keep house humidified) Stitches to right forearm F/u and wound care as directed     Eriberto Felch PHUONG, DO 11/16/2012 2:37 PM

## 2012-11-16 NOTE — Progress Notes (Signed)
   Patient ID: Elaysia Devargas MRN: [id number], DOB: 1933-03-14, 79 y.o. Date of Encounter: 11/16/2012, 2:29 PM   PROCEDURE NOTE: Verbal consent obtained.  Risks and benefits of the procedure were explained. Patient made an informed decision to proceed with the procedure. Sterile technique employed. Numbing: Anesthesia obtained with 1% lidocaine with epi 2 cc for local anesthesia.   Cleansed with soap and water. Irrigated. Betadine prep per usual protocol.  Wound explored, no deep structures involved, no foreign bodies.   Wound repaired with # 4 simple interrupted sutures 5-0 Prolene. Hemostasis obtained. Wound cleansed and dressed.  Wound care instructions including precautions covered with patient. Handout given.  Anticipate suture removal in 10 days.   Signed, Eula Listen, PA-C 11/16/2012 2:29 PM

## 2012-11-24 ENCOUNTER — Ambulatory Visit (INDEPENDENT_AMBULATORY_CARE_PROVIDER_SITE_OTHER): Payer: Medicare Other | Admitting: Physician Assistant

## 2012-11-24 VITALS — BP 135/71 | HR 73 | Temp 97.9°F | Resp 16 | Ht 65.0 in | Wt 141.0 lb

## 2012-11-24 DIAGNOSIS — Z4802 Encounter for removal of sutures: Secondary | ICD-10-CM

## 2012-11-24 NOTE — Progress Notes (Signed)
  Subjective:    Patient ID: [name] Chung, female    DOB: 12/03/1932, 79 y.o.   MRN: [id number]  HPI   [name] Chung is a very pleasant 79 yr old female here for removal of sutures placed here 11/16/12.  See previous note for details.  Pt states she is doing well.  No redness, drainage, swelling, bleeding.  States sutures feel tight, but otherwise ok.    Review of Systems  All other systems reviewed and are negative.       Objective:   Physical Exam  Vitals reviewed. Constitutional: She is oriented to person, place, and time. She appears well-developed and well-nourished.  Eyes: Conjunctivae are normal. No scleral icterus.  Pulmonary/Chest: Effort normal.  Neurological: She is alert and oriented to person, place, and time.  Skin: Skin is warm and dry.     Healed laceration right forearm  Psychiatric: She has a normal mood and affect. Her behavior is normal.     Filed Vitals:   11/24/12 1518  BP: 135/71  Pulse: 73  Temp: 97.9 F (36.6 C)  Resp: 16        Assessment & Plan:  Visit for suture removal   [name] Chung is a very pleasant 79 yr old female here for removal of sutures.  #4 simple interrupted sutures removed from Right forearm.  Pt tolerated well.  Some scabbing of the wound, but it is closed.  Very small amount of bleeding with removal of sutures.  Hemostasis obtained very easily with direct pressure.  Bandage applied.  Pt will return if new concerns arise.

## 2012-12-06 ENCOUNTER — Ambulatory Visit (INDEPENDENT_AMBULATORY_CARE_PROVIDER_SITE_OTHER): Payer: Medicare Other | Admitting: *Deleted

## 2012-12-06 DIAGNOSIS — Z7901 Long term (current) use of anticoagulants: Secondary | ICD-10-CM | POA: Diagnosis not present

## 2012-12-06 DIAGNOSIS — I359 Nonrheumatic aortic valve disorder, unspecified: Secondary | ICD-10-CM

## 2012-12-06 LAB — POCT INR: INR: 4.2

## 2012-12-27 ENCOUNTER — Ambulatory Visit (INDEPENDENT_AMBULATORY_CARE_PROVIDER_SITE_OTHER): Payer: Medicare Other | Admitting: *Deleted

## 2012-12-27 DIAGNOSIS — E7800 Pure hypercholesterolemia, unspecified: Secondary | ICD-10-CM | POA: Diagnosis not present

## 2012-12-27 DIAGNOSIS — R5381 Other malaise: Secondary | ICD-10-CM | POA: Diagnosis not present

## 2012-12-27 DIAGNOSIS — I359 Nonrheumatic aortic valve disorder, unspecified: Secondary | ICD-10-CM | POA: Diagnosis not present

## 2012-12-27 DIAGNOSIS — Z1331 Encounter for screening for depression: Secondary | ICD-10-CM | POA: Diagnosis not present

## 2012-12-27 DIAGNOSIS — Z0000 Encounter for general adult medical examination without abnormal findings: Secondary | ICD-10-CM | POA: Diagnosis not present

## 2012-12-27 DIAGNOSIS — R5383 Other fatigue: Secondary | ICD-10-CM | POA: Diagnosis not present

## 2012-12-27 DIAGNOSIS — Z79899 Other long term (current) drug therapy: Secondary | ICD-10-CM | POA: Diagnosis not present

## 2012-12-27 DIAGNOSIS — Z7901 Long term (current) use of anticoagulants: Secondary | ICD-10-CM | POA: Diagnosis not present

## 2012-12-27 DIAGNOSIS — Z954 Presence of other heart-valve replacement: Secondary | ICD-10-CM

## 2012-12-27 LAB — POCT INR: INR: 3.2

## 2013-01-03 DIAGNOSIS — R5381 Other malaise: Secondary | ICD-10-CM | POA: Diagnosis not present

## 2013-01-03 DIAGNOSIS — R5383 Other fatigue: Secondary | ICD-10-CM | POA: Diagnosis not present

## 2013-01-03 DIAGNOSIS — Z1331 Encounter for screening for depression: Secondary | ICD-10-CM | POA: Diagnosis not present

## 2013-01-03 DIAGNOSIS — E7800 Pure hypercholesterolemia, unspecified: Secondary | ICD-10-CM | POA: Diagnosis not present

## 2013-01-03 DIAGNOSIS — Z0000 Encounter for general adult medical examination without abnormal findings: Secondary | ICD-10-CM | POA: Diagnosis not present

## 2013-01-03 DIAGNOSIS — Z79899 Other long term (current) drug therapy: Secondary | ICD-10-CM | POA: Diagnosis not present

## 2013-01-04 ENCOUNTER — Encounter: Payer: Self-pay | Admitting: Cardiology

## 2013-01-04 ENCOUNTER — Other Ambulatory Visit (INDEPENDENT_AMBULATORY_CARE_PROVIDER_SITE_OTHER): Payer: Medicare Other

## 2013-01-04 ENCOUNTER — Ambulatory Visit (INDEPENDENT_AMBULATORY_CARE_PROVIDER_SITE_OTHER): Payer: Medicare Other | Admitting: Cardiology

## 2013-01-04 VITALS — BP 134/60 | HR 74 | Ht 65.0 in | Wt 144.8 lb

## 2013-01-04 DIAGNOSIS — I4891 Unspecified atrial fibrillation: Secondary | ICD-10-CM | POA: Diagnosis not present

## 2013-01-04 DIAGNOSIS — I38 Endocarditis, valve unspecified: Secondary | ICD-10-CM

## 2013-01-04 DIAGNOSIS — I482 Chronic atrial fibrillation, unspecified: Secondary | ICD-10-CM

## 2013-01-04 DIAGNOSIS — Z7901 Long term (current) use of anticoagulants: Secondary | ICD-10-CM

## 2013-01-04 LAB — BASIC METABOLIC PANEL
Calcium: 9.3 mg/dL (ref 8.4–10.5)
GFR: 81.93 mL/min (ref >60.00)
Glucose, Bld: 90 mg/dL (ref 70–99)
Potassium: 4.5 mEq/L (ref 3.5–5.1)
Sodium: 138 mEq/L (ref 135–145)

## 2013-01-04 MED ORDER — METOPROLOL TARTRATE 50 MG PO TABS: 25.0000 mg | ORAL_TABLET | Freq: Two times a day (BID) | ORAL | Status: DC

## 2013-01-04 NOTE — Patient Instructions (Signed)
Reduce your metoprolol to 25 mg twice a day.  Continue your other medications.  We will check lab work today.  I will see you in 2 months.

## 2013-01-05 LAB — DIGOXIN LEVEL: Digoxin Level: 0.8 ng/mL (ref 0.8–2.0)

## 2013-01-05 NOTE — Progress Notes (Signed)
HPI [name] is seen today for followup. She underwent redo open heart surgery in December 2012 at [facility]. This included redo aortic valve replacement with a Medtronic freestyle aortic root conduit. She also underwent tricuspid valve annuloplasty and repair. The mitral valve replacement was unchanged. She had initial surgery for rheumatic heart disease in 1982 with both aortic and mitral valve replacements with Bjork Shiley valves. She had repeat surgery in 1995 with a homograft aortic valve and a St. Jude mechanical mitral valve replacement.  On followup today she complains of feeling puffy and swollen. On her last visit we increased her HCTZ and reduced her metoprolol dose. She states she is no better. She attributes her symptoms to metoprolol. She has had some intermittent nosebleeds and her Coumadin has been adjusted.  Allergies  Allergen Reactions  . Asa Buff (Mag (Buffered Aspirin)   . Codeine   . Ultram (Tramadol Hcl)     Current Outpatient Prescriptions on File Prior to Visit  Medication Sig Dispense Refill  . aspirin 81 MG tablet Take 81 mg by mouth daily.      . butalbital-acetaminophen-caffeine (FIORICET, ESGIC) 50-325-40 MG per tablet 1 tablet 2 (two) times daily as needed.       . digoxin (LANOXIN) 0.125 MG tablet Take 1 tablet (0.125 mg total) by mouth daily.  90 tablet  3  . hydrochlorothiazide (HYDRODIURIL) 50 MG tablet Take 1 tablet (50 mg total) by mouth daily.  90 tablet  3  . LORazepam (ATIVAN) 1 MG tablet Take 1 mg by mouth as needed.        . potassium chloride (K-DUR) 10 MEQ tablet Take 10 mEq by mouth 2 (two) times daily.      . temazepam (RESTORIL) 30 MG capsule Take 30 mg by mouth daily.       Marland Kitchen warfarin (COUMADIN) 10 MG tablet Take 1 tablet (10 mg total) by mouth as directed. Take as directed by coumadin clinic, this is a 90 supply  145 tablet  1   No current facility-administered medications on file prior to visit.    Past Medical History   Diagnosis Date  . Valvular heart disease   . Chronic atrial fibrillation   . Chronic anticoagulation   . Rheumatic heart disease   . Aortic insufficiency   . Arthritis     Past Surgical History  Procedure Laterality Date  . Aortic valve replacement      1982, redo 1995 homograft, redo 2012 Medronic valve conduit-freestyle  . Mitral valve replacement      1982, redo 1995 St Jude  valve  . Back surgery    . Abdominal hysterectomy    . Cataract extraction w/ intraocular lens  implant, bilateral      Family History  Problem Relation Age of Onset  . Heart attack Mother   . Stroke Mother   . Diabetes Mother   . Other Father     CAR ACCIDENT    History   Social History  . Marital Status: Married    Spouse Name: N/A    Number of Children: 0  . Years of Education: N/A   Occupational History  . 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 Smoking status: Never Smoker   . Smokeless tobacco: Not on file  . Alcohol Use: No  . Drug Use: No  . Sexually Active: Not on file   Other Topics Concern  . Not on file  Social History Narrative  . No narrative on file    ROS  As noted in history of present illness. All other systems were reviewed and are negative.  PHYSICAL EXAM BP 134/60  Pulse 74  Ht 5\' 5"  (1.651 m)  Wt 144 lb 12.8 oz (65.681 kg)  BMI 24.1 kg/m2  SpO2 98% She is an elderly female in no acute distress. Pupils are equal round and reactive to light and accommodation. Sclera clear. Oropharynx is clear. Neck is supple no JVD, adenopathy, thyromegaly, or bruits. Carotid upstrokes are good. Lungs are clear. Cardiac exam reveals a good mechanical mitral valve click. There is a grade 2/6 systolic murmur heard best at the left sternal border. This radiates into the carotids. There is no diastolic murmur. PMI is normal. There is no S3. Abdomen soft and nontender without masses or bruits. Femoral and pedal pulses are 2+ and symmetric. She has 1+ edema.  Skin is warm and dry. She is alert and oriented x3. Cranial nerves II through XII are intact.  Laboratory data:  Echocardiogram performed 10/19/2012 at [facility] showed normal LV function. The right ventricle is moderately dilated. The prosthetic aortic valve was on she normally. The mechanical mitral valve prosthesis was functioning normally.  Blood work dated 01/03/2013 showed a normal chemistry panel. Total cholesterol 277, triglycerides 171, HDL 77, LDL 155. B12 level and TSH were normal. CBC was normal.  Dig level obtained today was 0.8.  ASSESSMENT AND PLAN  1. Valvular heart disease status post redo aortic valve replacement x3 and redo mitral valve surgery x2. Valve function is good by exam. She is therapeutic on her anticoagulation.  2. Chronic anticoagulation.  3. Edema. This appears to be stable. I'm not sure how much of this is related to metoprolol.   4. Permanent atrial fibrillation. We will reduce her metoprolol further to 25 mg twice a day. I will followup again in 2 months and as long as her rate is controlled we will try to get her off of this. She will continue on her current dose of digoxin.

## 2013-01-24 ENCOUNTER — Ambulatory Visit (INDEPENDENT_AMBULATORY_CARE_PROVIDER_SITE_OTHER): Payer: Medicare Other

## 2013-01-24 DIAGNOSIS — I359 Nonrheumatic aortic valve disorder, unspecified: Secondary | ICD-10-CM | POA: Diagnosis not present

## 2013-01-24 DIAGNOSIS — Z7901 Long term (current) use of anticoagulants: Secondary | ICD-10-CM

## 2013-01-24 DIAGNOSIS — Z954 Presence of other heart-valve replacement: Secondary | ICD-10-CM | POA: Diagnosis not present

## 2013-01-25 ENCOUNTER — Encounter: Payer: Self-pay | Admitting: Cardiology

## 2013-01-31 ENCOUNTER — Ambulatory Visit (INDEPENDENT_AMBULATORY_CARE_PROVIDER_SITE_OTHER): Payer: Medicare Other | Admitting: *Deleted

## 2013-01-31 DIAGNOSIS — Z954 Presence of other heart-valve replacement: Secondary | ICD-10-CM

## 2013-01-31 DIAGNOSIS — Z7901 Long term (current) use of anticoagulants: Secondary | ICD-10-CM

## 2013-01-31 DIAGNOSIS — I359 Nonrheumatic aortic valve disorder, unspecified: Secondary | ICD-10-CM

## 2013-01-31 LAB — POCT INR: INR: 3.6

## 2013-02-13 ENCOUNTER — Other Ambulatory Visit: Payer: Self-pay | Admitting: Pharmacist

## 2013-02-13 MED ORDER — WARFARIN SODIUM 10 MG PO TABS: 10.0000 mg | ORAL_TABLET | ORAL | Status: DC

## 2013-02-21 ENCOUNTER — Ambulatory Visit (INDEPENDENT_AMBULATORY_CARE_PROVIDER_SITE_OTHER): Payer: Medicare Other

## 2013-02-21 DIAGNOSIS — Z954 Presence of other heart-valve replacement: Secondary | ICD-10-CM | POA: Diagnosis not present

## 2013-02-21 DIAGNOSIS — Z7901 Long term (current) use of anticoagulants: Secondary | ICD-10-CM | POA: Diagnosis not present

## 2013-02-21 DIAGNOSIS — I359 Nonrheumatic aortic valve disorder, unspecified: Secondary | ICD-10-CM | POA: Diagnosis not present

## 2013-02-21 LAB — POCT INR: INR: 3.9

## 2013-03-21 ENCOUNTER — Encounter: Payer: Self-pay | Admitting: Cardiology

## 2013-03-21 ENCOUNTER — Ambulatory Visit (INDEPENDENT_AMBULATORY_CARE_PROVIDER_SITE_OTHER): Payer: Medicare Other | Admitting: Cardiology

## 2013-03-21 ENCOUNTER — Ambulatory Visit (INDEPENDENT_AMBULATORY_CARE_PROVIDER_SITE_OTHER): Payer: Medicare Other

## 2013-03-21 VITALS — BP 160/60 | HR 62 | Ht 65.5 in | Wt 145.0 lb

## 2013-03-21 DIAGNOSIS — I482 Chronic atrial fibrillation, unspecified: Secondary | ICD-10-CM

## 2013-03-21 DIAGNOSIS — Z954 Presence of other heart-valve replacement: Secondary | ICD-10-CM

## 2013-03-21 DIAGNOSIS — I4891 Unspecified atrial fibrillation: Secondary | ICD-10-CM | POA: Diagnosis not present

## 2013-03-21 DIAGNOSIS — I099 Rheumatic heart disease, unspecified: Secondary | ICD-10-CM

## 2013-03-21 DIAGNOSIS — I359 Nonrheumatic aortic valve disorder, unspecified: Secondary | ICD-10-CM | POA: Diagnosis not present

## 2013-03-21 DIAGNOSIS — Z7901 Long term (current) use of anticoagulants: Secondary | ICD-10-CM

## 2013-03-21 DIAGNOSIS — I38 Endocarditis, valve unspecified: Secondary | ICD-10-CM

## 2013-03-21 LAB — POCT INR: INR: 3.6

## 2013-03-21 NOTE — Patient Instructions (Signed)
Stop ASA and metoprolol.  Continue your other therapy  I will see you in 6 months.

## 2013-03-22 NOTE — Progress Notes (Signed)
HPI:  [name] Chung is seen today for followup. She underwent redo open heart surgery in December 2012 at [facility]. This included redo aortic valve replacement with a Medtronic freestyle aortic root conduit. She also underwent tricuspid valve annuloplasty and repair. The mitral valve replacement was unchanged. She had initial surgery for rheumatic heart disease in 1982 with both aortic and mitral valve replacements with Bjork Shiley valves. She had repeat surgery in 1995 with a homograft aortic valve and a St. Jude mechanical mitral valve replacement.  On followup today she is feeling better. We have weaned her off her metoprolol and she thinks that her edema is a little bit better. She still has it intermittently. She does complain of a lot of bruising on her arms and wants to stop her aspirin. She denies any dyspnea or chest pain. Her energy level is good. She denies any palpitations.  Allergies  Allergen Reactions  . Asa Buff (Mag (Buffered Aspirin)   . Codeine   . Ultram (Tramadol Hcl)     Current Outpatient Prescriptions on File Prior to Visit  Medication Sig Dispense Refill  . butalbital-acetaminophen-caffeine (FIORICET, ESGIC) 50-325-40 MG per tablet 1 tablet 2 (two) times daily as needed.       . digoxin (LANOXIN) 0.125 MG tablet Take 1 tablet (0.125 mg total) by mouth daily.  90 tablet  3  . hydrochlorothiazide (HYDRODIURIL) 50 MG tablet Take 1 tablet (50 mg total) by mouth daily.  90 tablet  3  . LORazepam (ATIVAN) 1 MG tablet Take 1 mg by mouth as needed.        . potassium chloride (K-DUR) 10 MEQ tablet Take 10 mEq by mouth 2 (two) times daily.      . temazepam (RESTORIL) 30 MG capsule Take 30 mg by mouth daily.       Marland Kitchen warfarin (COUMADIN) 10 MG tablet Take 1 tablet (10 mg total) by mouth as directed. Take as directed by coumadin clinic, this is a 90 supply  145 tablet  1   No current facility-administered medications on file prior to visit.    Past Medical History   Diagnosis Date  . Valvular heart disease   . Chronic atrial fibrillation   . Chronic anticoagulation   . Rheumatic heart disease   . Aortic insufficiency   . Arthritis   . Edema     Past Surgical History  Procedure Laterality Date  . Aortic valve replacement      1982, redo 1995 homograft, redo 2012 Medronic valve conduit-freestyle  . Mitral valve replacement      1982, redo 1995 St Jude  valve  . Back surgery    . Abdominal hysterectomy    . Cataract extraction w/ intraocular lens  implant, bilateral      Family History  Problem Relation Age of Onset  . Heart attack Mother   . Stroke Mother   . Diabetes Mother   . Other Father     CAR ACCIDENT    History   Social History  . Marital Status: Married    Spouse Name: N/A    Number of Children: 0  . Years of Education: N/A   Occupational History  . 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 Smoking status: Never Smoker   . Smokeless tobacco: Not on file  . Alcohol Use: No  . Drug Use: No  . Sexually Active: Not on file   Other Topics Concern  .  Not on file   Social History Narrative  . No narrative on file    ROS  As noted in history of present illness. All other systems were reviewed and are negative.  PHYSICAL EXAM BP 160/60  Pulse 62  Ht 5' 5.5" (1.664 m)  Wt 145 lb (65.772 kg)  BMI 23.75 kg/m2 She is an elderly female in no acute distress. Pupils are equal round and reactive to light and accommodation. Sclera clear. Oropharynx is clear. Neck is supple no JVD, adenopathy, thyromegaly, or bruits. Carotid upstrokes are good. Lungs are clear. Cardiac exam reveals a good mechanical mitral valve click. There is a grade 2/6 systolic murmur heard best at the left sternal border. This radiates into the carotids. There is no diastolic murmur. PMI is normal. There is no S3. Abdomen soft and nontender without masses or bruits. Femoral and pedal pulses are 2+ and symmetric. She has 1+ edema. Skin  is warm and dry. She is alert and oriented x3. Cranial nerves II through XII are intact.  Laboratory data:  Echocardiogram performed 10/19/2012 at [facility] showed normal LV function. The right ventricle is moderately dilated. The prosthetic aortic valve was on she normally. The mechanical mitral valve prosthesis was functioning normally.  Blood work dated 01/03/2013 showed a normal chemistry panel. Total cholesterol 277, triglycerides 171, HDL 77, LDL 155. B12 level and TSH were normal. CBC was normal.  Dig level obtained  was 0.8.  ASSESSMENT AND PLAN  1. Valvular heart disease status post redo aortic valve replacement x3 and redo mitral valve surgery x2. Valve function is good by exam. She is therapeutic on her anticoagulation.  2. Chronic anticoagulation. I think it is okay for her to stop aspirin at this point.  3. Edema. This appears to be stable. I'm not sure how much of this is related to metoprolol but the patient feels better off of metoprolol.   4. Permanent atrial fibrillation.  She will continue on her current dose of digoxin. Rate is well controlled off of metoprolol.

## 2013-03-29 DIAGNOSIS — C44519 Basal cell carcinoma of skin of other part of trunk: Secondary | ICD-10-CM | POA: Diagnosis not present

## 2013-03-30 DIAGNOSIS — Z1231 Encounter for screening mammogram for malignant neoplasm of breast: Secondary | ICD-10-CM | POA: Diagnosis not present

## 2013-04-13 DIAGNOSIS — D049 Carcinoma in situ of skin, unspecified: Secondary | ICD-10-CM | POA: Diagnosis not present

## 2013-04-13 DIAGNOSIS — L570 Actinic keratosis: Secondary | ICD-10-CM | POA: Diagnosis not present

## 2013-04-13 DIAGNOSIS — C44519 Basal cell carcinoma of skin of other part of trunk: Secondary | ICD-10-CM | POA: Diagnosis not present

## 2013-04-18 ENCOUNTER — Ambulatory Visit (INDEPENDENT_AMBULATORY_CARE_PROVIDER_SITE_OTHER): Payer: Medicare Other | Admitting: Pharmacist

## 2013-04-18 DIAGNOSIS — Z7901 Long term (current) use of anticoagulants: Secondary | ICD-10-CM | POA: Diagnosis not present

## 2013-04-18 DIAGNOSIS — I359 Nonrheumatic aortic valve disorder, unspecified: Secondary | ICD-10-CM | POA: Diagnosis not present

## 2013-04-18 DIAGNOSIS — Z954 Presence of other heart-valve replacement: Secondary | ICD-10-CM | POA: Diagnosis not present

## 2013-04-18 LAB — POCT INR: INR: 3.7

## 2013-05-09 ENCOUNTER — Ambulatory Visit (INDEPENDENT_AMBULATORY_CARE_PROVIDER_SITE_OTHER): Payer: Medicare Other | Admitting: *Deleted

## 2013-05-09 DIAGNOSIS — Z954 Presence of other heart-valve replacement: Secondary | ICD-10-CM | POA: Diagnosis not present

## 2013-05-09 DIAGNOSIS — I359 Nonrheumatic aortic valve disorder, unspecified: Secondary | ICD-10-CM

## 2013-05-09 DIAGNOSIS — Z7901 Long term (current) use of anticoagulants: Secondary | ICD-10-CM

## 2013-05-18 ENCOUNTER — Telehealth: Payer: Self-pay

## 2013-05-18 NOTE — Telephone Encounter (Signed)
Received physician fax form for HCTZ 50 mg. Dr.Jordan signed and form faxed to fax # [phone].

## 2013-06-06 ENCOUNTER — Ambulatory Visit (INDEPENDENT_AMBULATORY_CARE_PROVIDER_SITE_OTHER): Payer: Medicare Other | Admitting: *Deleted

## 2013-06-06 DIAGNOSIS — Z954 Presence of other heart-valve replacement: Secondary | ICD-10-CM | POA: Diagnosis not present

## 2013-06-06 DIAGNOSIS — Z7901 Long term (current) use of anticoagulants: Secondary | ICD-10-CM | POA: Diagnosis not present

## 2013-06-06 DIAGNOSIS — I359 Nonrheumatic aortic valve disorder, unspecified: Secondary | ICD-10-CM

## 2013-06-06 LAB — POCT INR: INR: 3.3

## 2013-06-20 DIAGNOSIS — M2550 Pain in unspecified joint: Secondary | ICD-10-CM | POA: Diagnosis not present

## 2013-06-20 DIAGNOSIS — J310 Chronic rhinitis: Secondary | ICD-10-CM | POA: Diagnosis not present

## 2013-06-20 DIAGNOSIS — I10 Essential (primary) hypertension: Secondary | ICD-10-CM | POA: Diagnosis not present

## 2013-07-04 ENCOUNTER — Ambulatory Visit (INDEPENDENT_AMBULATORY_CARE_PROVIDER_SITE_OTHER): Payer: Medicare Other | Admitting: *Deleted

## 2013-07-04 DIAGNOSIS — Z954 Presence of other heart-valve replacement: Secondary | ICD-10-CM

## 2013-07-04 DIAGNOSIS — I359 Nonrheumatic aortic valve disorder, unspecified: Secondary | ICD-10-CM

## 2013-07-04 DIAGNOSIS — Z7901 Long term (current) use of anticoagulants: Secondary | ICD-10-CM

## 2013-07-04 LAB — POCT INR: INR: 3.2

## 2013-08-01 ENCOUNTER — Ambulatory Visit (INDEPENDENT_AMBULATORY_CARE_PROVIDER_SITE_OTHER): Payer: Medicare Other | Admitting: *Deleted

## 2013-08-01 DIAGNOSIS — Z954 Presence of other heart-valve replacement: Secondary | ICD-10-CM

## 2013-08-01 DIAGNOSIS — I359 Nonrheumatic aortic valve disorder, unspecified: Secondary | ICD-10-CM | POA: Diagnosis not present

## 2013-08-01 DIAGNOSIS — Z7901 Long term (current) use of anticoagulants: Secondary | ICD-10-CM | POA: Diagnosis not present

## 2013-08-16 ENCOUNTER — Other Ambulatory Visit: Payer: Self-pay | Admitting: Cardiology

## 2013-09-05 ENCOUNTER — Ambulatory Visit (INDEPENDENT_AMBULATORY_CARE_PROVIDER_SITE_OTHER): Payer: Medicare Other | Admitting: General Practice

## 2013-09-05 DIAGNOSIS — Z954 Presence of other heart-valve replacement: Secondary | ICD-10-CM | POA: Diagnosis not present

## 2013-09-05 DIAGNOSIS — Z7901 Long term (current) use of anticoagulants: Secondary | ICD-10-CM

## 2013-09-05 DIAGNOSIS — I359 Nonrheumatic aortic valve disorder, unspecified: Secondary | ICD-10-CM | POA: Diagnosis not present

## 2013-09-14 ENCOUNTER — Encounter: Payer: Self-pay | Admitting: Cardiology

## 2013-09-14 ENCOUNTER — Ambulatory Visit (INDEPENDENT_AMBULATORY_CARE_PROVIDER_SITE_OTHER): Payer: Medicare Other | Admitting: Cardiology

## 2013-09-14 VITALS — BP 142/62 | HR 82 | Ht 65.0 in | Wt 144.0 lb

## 2013-09-14 DIAGNOSIS — I099 Rheumatic heart disease, unspecified: Secondary | ICD-10-CM

## 2013-09-14 DIAGNOSIS — I4891 Unspecified atrial fibrillation: Secondary | ICD-10-CM

## 2013-09-14 DIAGNOSIS — Z954 Presence of other heart-valve replacement: Secondary | ICD-10-CM

## 2013-09-14 DIAGNOSIS — I38 Endocarditis, valve unspecified: Secondary | ICD-10-CM

## 2013-09-14 DIAGNOSIS — Z7901 Long term (current) use of anticoagulants: Secondary | ICD-10-CM | POA: Diagnosis not present

## 2013-09-14 DIAGNOSIS — Z952 Presence of prosthetic heart valve: Secondary | ICD-10-CM | POA: Insufficient documentation

## 2013-09-14 DIAGNOSIS — I482 Chronic atrial fibrillation, unspecified: Secondary | ICD-10-CM

## 2013-09-14 NOTE — Patient Instructions (Signed)
Continue your current therapy  I will see you in 6 months.   

## 2013-09-14 NOTE — Progress Notes (Signed)
HPI:  [name] Chung is seen today for followup. She underwent redo open heart surgery in December 2012 at [facility]. This included redo aortic valve replacement with a Medtronic freestyle aortic root conduit. She also underwent tricuspid valve annuloplasty and repair. The mitral valve replacement was unchanged. She had initial surgery for rheumatic heart disease in 1982 with both aortic and mitral valve replacements with Bjork Shiley valves. She had repeat surgery in 1995 with a homograft aortic valve and a St. Jude mechanical mitral valve replacement.  On followup today she is feeling well.  Her energy level is sometimes reduced. She denies any palpitations. She denies any chest pain or SOB.   Allergies  Allergen Reactions  . Asa Buff (Mag [Buffered Aspirin]   . Codeine   . Ultram [Tramadol Hcl]     Current Outpatient Prescriptions on File Prior to Visit  Medication Sig Dispense Refill  . butalbital-acetaminophen-caffeine (FIORICET, ESGIC) 50-325-40 MG per tablet 1 tablet 2 (two) times daily as needed.       . digoxin (LANOXIN) 0.125 MG tablet Take 1 tablet (0.125 mg total) by mouth daily.  90 tablet  3  . hydrochlorothiazide (HYDRODIURIL) 50 MG tablet Take 1 tablet (50 mg total) by mouth daily.  90 tablet  3  . LORazepam (ATIVAN) 1 MG tablet Take 1 mg by mouth as needed.        . potassium chloride (K-DUR) 10 MEQ tablet Take 10 mEq by mouth 2 (two) times daily.      . temazepam (RESTORIL) 30 MG capsule Take 30 mg by mouth daily.       Marland Kitchen warfarin (COUMADIN) 10 MG tablet TAKE 1 TABLET (10 MG TOTAL) AS DIRECTED BY COUMADIN CLINIC  145 tablet  1   No current facility-administered medications on file prior to visit.    Past Medical History  Diagnosis Date  . Valvular heart disease   . Chronic atrial fibrillation   . Chronic anticoagulation   . Rheumatic heart disease   . Aortic insufficiency   . Arthritis   . Edema     Past Surgical History  Procedure Laterality Date  .  Aortic valve replacement      1982, redo 1995 homograft, redo 2012 Medronic valve conduit-freestyle  . Mitral valve replacement      1982, redo 1995 St Jude  valve  . Back surgery    . Abdominal hysterectomy    . Cataract extraction w/ intraocular lens  implant, bilateral      Family History  Problem Relation Age of Onset  . Heart attack Mother   . Stroke Mother   . Diabetes Mother   . Other Father     CAR ACCIDENT    History   Social History  . Marital Status: Married    Spouse Name: N/A    Number of Children: 0  . Years of Education: N/A   Occupational History  . 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 Smoking status: Never Smoker   . Smokeless tobacco: Not on file  . Alcohol Use: No  . Drug Use: No  . Sexual Activity: Not on file   Other Topics Concern  . Not on file   Social History Narrative  . No narrative on file    ROS  As noted in history of present illness. All other systems were reviewed and are negative.  PHYSICAL EXAM BP 142/62  Pulse 82  Ht 5\' 5"  (1.651  m)  Wt 144 lb (65.318 kg)  BMI 23.96 kg/m2 She is an elderly female in no acute distress. HEENT is unremarkable. Neck is supple no JVD, adenopathy, thyromegaly, or bruits. Carotid upstrokes are good. Lungs are clear. Cardiac exam reveals a good mechanical mitral valve click. There is a grade 2/6 systolic murmur heard best at the left sternal border. This radiates into the carotids. There is no diastolic murmur. PMI is normal. There is no S3. Abdomen soft and nontender without masses or bruits. Femoral and pedal pulses are 2+ and symmetric. She has 1+ edema. Skin is warm and dry. She is alert and oriented x3. Cranial nerves II through XII are intact.  Laboratory data:  Echocardiogram performed 10/19/2012 at [facility] showed normal LV function. The right ventricle is moderately dilated. The prosthetic aortic valve was on she normally. The mechanical mitral valve  prosthesis was functioning normally.  ASSESSMENT AND PLAN  1. Valvular heart disease status post redo aortic valve replacement x3 and redo mitral valve surgery x2. Valve function is good by exam. She is therapeutic on her anticoagulation. She plans to have a follow up Echo at [facility] in January.  2. Chronic anticoagulation. Continue coumadin.  3. Edema. This appears to be stable.   4. Permanent atrial fibrillation.  She will continue on her current dose of digoxin. Rate is well controlled off of metoprolol.

## 2013-10-03 ENCOUNTER — Ambulatory Visit (INDEPENDENT_AMBULATORY_CARE_PROVIDER_SITE_OTHER): Payer: Medicare Other | Admitting: Pharmacist

## 2013-10-03 DIAGNOSIS — Z954 Presence of other heart-valve replacement: Secondary | ICD-10-CM

## 2013-10-03 DIAGNOSIS — I359 Nonrheumatic aortic valve disorder, unspecified: Secondary | ICD-10-CM

## 2013-10-03 DIAGNOSIS — Z7901 Long term (current) use of anticoagulants: Secondary | ICD-10-CM

## 2013-10-03 LAB — POCT INR: INR: 3.8

## 2013-10-12 ENCOUNTER — Other Ambulatory Visit: Payer: Self-pay | Admitting: *Deleted

## 2013-10-12 ENCOUNTER — Telehealth: Payer: Self-pay

## 2013-10-12 MED ORDER — HYDROCHLOROTHIAZIDE 50 MG PO TABS: 50.0000 mg | ORAL_TABLET | Freq: Every day | ORAL | Status: DC

## 2013-10-12 MED ORDER — WARFARIN SODIUM 10 MG PO TABS: 10.0000 mg | ORAL_TABLET | ORAL | Status: DC

## 2013-10-12 MED ORDER — DIGOXIN 125 MCG PO TABS: 0.1250 mg | ORAL_TABLET | Freq: Every day | ORAL | Status: DC

## 2013-10-12 NOTE — Telephone Encounter (Signed)
Received letter from patient requesting 90 day refill on digoxin,hctz.Refills sent to [facility].

## 2013-10-18 DIAGNOSIS — I369 Nonrheumatic tricuspid valve disorder, unspecified: Secondary | ICD-10-CM | POA: Diagnosis not present

## 2013-10-18 DIAGNOSIS — Z954 Presence of other heart-valve replacement: Secondary | ICD-10-CM | POA: Diagnosis not present

## 2013-10-18 DIAGNOSIS — I359 Nonrheumatic aortic valve disorder, unspecified: Secondary | ICD-10-CM | POA: Diagnosis not present

## 2013-10-31 ENCOUNTER — Ambulatory Visit (INDEPENDENT_AMBULATORY_CARE_PROVIDER_SITE_OTHER): Payer: Medicare Other | Admitting: *Deleted

## 2013-10-31 DIAGNOSIS — Z5181 Encounter for therapeutic drug level monitoring: Secondary | ICD-10-CM | POA: Insufficient documentation

## 2013-10-31 DIAGNOSIS — Z7901 Long term (current) use of anticoagulants: Secondary | ICD-10-CM

## 2013-10-31 DIAGNOSIS — I359 Nonrheumatic aortic valve disorder, unspecified: Secondary | ICD-10-CM | POA: Diagnosis not present

## 2013-10-31 DIAGNOSIS — Z954 Presence of other heart-valve replacement: Secondary | ICD-10-CM | POA: Diagnosis not present

## 2013-10-31 LAB — POCT INR: INR: 3.2

## 2013-11-09 ENCOUNTER — Ambulatory Visit (INDEPENDENT_AMBULATORY_CARE_PROVIDER_SITE_OTHER): Payer: Medicare Other | Admitting: Family Medicine

## 2013-11-09 VITALS — BP 128/60 | HR 85 | Temp 97.9°F | Resp 16 | Ht 66.0 in | Wt 148.2 lb

## 2013-11-09 DIAGNOSIS — T148XXA Other injury of unspecified body region, initial encounter: Secondary | ICD-10-CM

## 2013-11-09 DIAGNOSIS — R238 Other skin changes: Secondary | ICD-10-CM

## 2013-11-09 DIAGNOSIS — R21 Rash and other nonspecific skin eruption: Secondary | ICD-10-CM

## 2013-11-09 DIAGNOSIS — R040 Epistaxis: Secondary | ICD-10-CM | POA: Diagnosis not present

## 2013-11-09 DIAGNOSIS — H40019 Open angle with borderline findings, low risk, unspecified eye: Secondary | ICD-10-CM | POA: Diagnosis not present

## 2013-11-09 MED ORDER — MUPIROCIN 2 % EX OINT: 1.0000 "application " | TOPICAL_OINTMENT | Freq: Two times a day (BID) | CUTANEOUS | Status: DC

## 2013-11-10 NOTE — Progress Notes (Signed)
Chief Complaint:  Chief Complaint  Patient presents with  . left leg wound    since Sunday swollen, redness  . nasal bleeding    right sided    HPI: [name] Chung is a 80 y.o. female who is here for:  1. 5 day history of left wound after she hit it against something in house, started bleeding since she is on coumadin for her mechanical heart valves ( redo x 3) and permanent a fib today just looks more irritated and wants to know if she can get something for it. It is tender, not very warm. She has very thin skin so complicates healing in addition to coumadin use. She denies fevers, chills, n/v/abd pain. A scab has formed oer it after she used vasoline but the surrounding border is slightly red. She has Valvular heart disease status post redo aortic valve replacement x3 and redo mitral valve surgery x2. Last checked INR on 1/27 was 3.2   2. She has had occassional nose bleeds on her right nostril, last time she got a silver nitrate swab cauterization and this lasted for 9-10 months. Her house is heated but knowing that she has been using vasoline and this seems to help but she still has little bit of nose bleed intermittently. Denies dizziness, syncope or CP   Past Medical History  Diagnosis Date  . Valvular heart disease   . Chronic atrial fibrillation   . Chronic anticoagulation   . Rheumatic heart disease   . Aortic insufficiency   . Arthritis   . Edema    Past Surgical History  Procedure Laterality Date  . Aortic valve replacement      1982, redo 1995 homograft, redo 2012 Medronic valve conduit-freestyle  . Mitral valve replacement      19 82, redo 1995 St Jude  valve  . Back surgery    . Abdominal hysterectomy    . Cataract extraction w/ intraocular lens  implant, bilateral     History   Social History  . Marital Status: Married    Spouse Name: N/A    Number of Children: 0  . Years of Education: N/A   Occupational History  . 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 Smoking status: Never Smoker   . Smokeless tobacco: None  . Alcohol Use: No  . Drug Use: No  . Sexual Activity: None   Other Topics Concern  . None   Social History Narrative  . None   Family History  Problem Relation Age of Onset  . Heart attack Mother   . Stroke Mother   . Diabetes Mother   . Other Father     CAR ACCIDENT   Allergies  Allergen Reactions  . Asa Buff (Mag [Buffered Aspirin] Other (See Comments)    On coumadin  . Codeine   . Ultram [Tramadol Hcl]    Prior to Admission medications   Medication Sig Start Date End Date Taking? Authorizing Provider  butalbital-acetaminophen-caffeine (FIORICET, ESGIC) 50-325-40 MG per tablet 1 tablet 2 (two) times daily as needed.  12/08/10  Yes Historical Provider, MD  digoxin (LANOXIN) 0.125 MG tablet Take 1 tablet (0.125 mg total) by mouth daily. 10/12/13  Yes Peter M Martinique, MD  hydrochlorothiazide (HYDRODIURIL) 50 MG tablet Take 1 tablet (50 mg total) by mouth daily. 10/12/13  Yes Peter M Martinique, MD  LORazepam (ATIVAN) 1 MG tablet Take 1 mg by mouth as needed.  Yes Historical Provider, MD  potassium chloride (K-DUR) 10 MEQ tablet Take 10 mEq by mouth 2 (two) times daily.   Yes Historical Provider, MD  temazepam (RESTORIL) 30 MG capsule Take 30 mg by mouth daily.    Yes Historical Provider, MD  warfarin (COUMADIN) 10 MG tablet Take 1 tablet (10 mg total) by mouth as directed. 10/12/13  Yes Peter M Martinique, MD  mupirocin ointment (BACTROBAN) 2 % Place 1 application into the nose 2 (two) times daily. 11/09/13   Thao P Le, DO     ROS: The patient denies fevers, chills, night sweats, unintentional weight loss, chest pain, palpitations, wheezing, dyspnea on exertion, nausea, vomiting, abdominal pain, dysuria, hematuria, melena, numbness, weakness, or tingling.   All other systems have been reviewed and were otherwise negative with the exception of those mentioned in the HPI and as above.    PHYSICAL  EXAM: Filed Vitals:   11/09/13 1931  BP: 128/60  Pulse: 85  Temp: 97.9 F (36.6 C)  Resp: 16   Filed Vitals:   11/09/13 1931  Height: 5\' 6"  (1.676 m)  Weight: 148 lb 3.2 oz (67.223 kg)   Body mass index is 23.93 kg/(m^2).  General: Alert, no acute distress HEENT:  Normocephalic, atraumatic, oropharynx patent. EOMI, PERRLA,+  Right nasal passage mucosa small area of bleeding  Cardiovascular:   no rubs + systolic murmur, midsytolic click, no appreciable gallops.  No Carotid bruits, radial pulse intact. Minimal pedal edema.  Respiratory: Clear to auscultation bilaterally.  No wheezes, rales, or rhonchi.  No cyanosis, no use of accessory musculature GI: No organomegaly, abdomen is soft and non-tender, positive bowel sounds.  No masses. Skin: + hyperkeratotic aktinic like diffuse rash on bialteal legs and arms., + wound on left anterior lower leg , erythematous, purple, excoriated, scab, minimally warm and tender Neurologic: Facial musculature symmetric. Psychiatric: Patient is appropriate throughout our interaction. Lymphatic: No cervical lymphadenopathy Musculoskeletal: Gait intact.   LABS: Results for orders placed in visit on 10/31/13  POCT INR      Result Value Range   INR 3.2       EKG/XRAY:   Primary read interpreted by Dr. Marin Comment at [facility].   ASSESSMENT/PLAN: Encounter Diagnoses  Name Primary?  . Multiple wounds of skin Yes  . Rash and nonspecific skin eruption   . Epistaxis    Bactroban on skin at site of redness and excoration. NO oral abx fro now since on chronic coumadin for mechanical heart valves and A fib  and last INR check on 10/31/2013 was 3.2.  She can wash with soap and water, dry and then put bactroban and then telfa Non adhesive and then curlex. Materials given She has this very bizzarre SK/SK like rash on her legs and arms which she has gone to Dermatology for and each time they try to cryo or cut lesions off and since she is on coumadin she bleeds so she  stopped doing this I advise her that once the localized erytheamtous are which could be an early sign of infection is completely gone she can do wound care with warm water soaks, lubricate and then once skin is soft then can getnly clean off slough with just 4x4 wet gauze. The material causes some friction but is gentle enough o not cause any skin tears and possibly could remove some of the hyperkeratotic lesions.  She can lubricate with vasoline, not between toes though Advise to call me in 2 days or 3 days or if worsening  sxs then I can call in oral abx prn Cauterized area of bleedign in nose with silver nitrate x 2  F/u prn   Gross sideeffects, risk and benefits, and alternatives of medications d/w patient. Patient is aware that all medications have potential sideeffects and we are unable to predict every sideeffect or drug-drug interaction that may occur.  LE, Hope, DO 11/10/2013 6:43 PM

## 2013-11-14 ENCOUNTER — Telehealth: Payer: Self-pay

## 2013-11-14 DIAGNOSIS — T148XXA Other injury of unspecified body region, initial encounter: Secondary | ICD-10-CM

## 2013-11-14 DIAGNOSIS — R21 Rash and other nonspecific skin eruption: Secondary | ICD-10-CM

## 2013-11-14 DIAGNOSIS — R238 Other skin changes: Principal | ICD-10-CM

## 2013-11-14 MED ORDER — MUPIROCIN 2 % EX OINT: 1.0000 "application " | TOPICAL_OINTMENT | Freq: Two times a day (BID) | CUTANEOUS | Status: DC

## 2013-11-14 NOTE — Telephone Encounter (Signed)
Pharm needs clarification on bactroban that sig says appl into the nose and it is ointment that is supposed to be on skin. Checked OV notes and am resending w/corrected sig.

## 2013-12-05 ENCOUNTER — Ambulatory Visit (INDEPENDENT_AMBULATORY_CARE_PROVIDER_SITE_OTHER): Payer: Medicare Other | Admitting: *Deleted

## 2013-12-05 DIAGNOSIS — Z7901 Long term (current) use of anticoagulants: Secondary | ICD-10-CM | POA: Diagnosis not present

## 2013-12-05 DIAGNOSIS — Z954 Presence of other heart-valve replacement: Secondary | ICD-10-CM | POA: Diagnosis not present

## 2013-12-05 DIAGNOSIS — I359 Nonrheumatic aortic valve disorder, unspecified: Secondary | ICD-10-CM

## 2013-12-05 DIAGNOSIS — Z5181 Encounter for therapeutic drug level monitoring: Secondary | ICD-10-CM | POA: Diagnosis not present

## 2013-12-05 LAB — POCT INR: INR: 4.7

## 2013-12-21 ENCOUNTER — Ambulatory Visit (INDEPENDENT_AMBULATORY_CARE_PROVIDER_SITE_OTHER): Payer: Medicare Other | Admitting: *Deleted

## 2013-12-21 DIAGNOSIS — I359 Nonrheumatic aortic valve disorder, unspecified: Secondary | ICD-10-CM | POA: Diagnosis not present

## 2013-12-21 DIAGNOSIS — Z7901 Long term (current) use of anticoagulants: Secondary | ICD-10-CM | POA: Diagnosis not present

## 2013-12-21 DIAGNOSIS — Z954 Presence of other heart-valve replacement: Secondary | ICD-10-CM | POA: Diagnosis not present

## 2013-12-21 DIAGNOSIS — Z5181 Encounter for therapeutic drug level monitoring: Secondary | ICD-10-CM

## 2013-12-21 LAB — POCT INR: INR: 3.3

## 2014-01-02 DIAGNOSIS — Z23 Encounter for immunization: Secondary | ICD-10-CM | POA: Diagnosis not present

## 2014-01-02 DIAGNOSIS — Z1331 Encounter for screening for depression: Secondary | ICD-10-CM | POA: Diagnosis not present

## 2014-01-02 DIAGNOSIS — I4891 Unspecified atrial fibrillation: Secondary | ICD-10-CM | POA: Diagnosis not present

## 2014-01-02 DIAGNOSIS — Z0000 Encounter for general adult medical examination without abnormal findings: Secondary | ICD-10-CM | POA: Diagnosis not present

## 2014-01-02 DIAGNOSIS — Z79899 Other long term (current) drug therapy: Secondary | ICD-10-CM | POA: Diagnosis not present

## 2014-01-16 ENCOUNTER — Ambulatory Visit (INDEPENDENT_AMBULATORY_CARE_PROVIDER_SITE_OTHER): Payer: Medicare Other | Admitting: *Deleted

## 2014-01-16 DIAGNOSIS — Z954 Presence of other heart-valve replacement: Secondary | ICD-10-CM | POA: Diagnosis not present

## 2014-01-16 DIAGNOSIS — I359 Nonrheumatic aortic valve disorder, unspecified: Secondary | ICD-10-CM | POA: Diagnosis not present

## 2014-01-16 DIAGNOSIS — Z5181 Encounter for therapeutic drug level monitoring: Secondary | ICD-10-CM | POA: Diagnosis not present

## 2014-01-16 DIAGNOSIS — Z7901 Long term (current) use of anticoagulants: Secondary | ICD-10-CM

## 2014-01-16 LAB — POCT INR: INR: 2.7

## 2014-02-08 DIAGNOSIS — H40019 Open angle with borderline findings, low risk, unspecified eye: Secondary | ICD-10-CM | POA: Diagnosis not present

## 2014-02-13 ENCOUNTER — Ambulatory Visit (INDEPENDENT_AMBULATORY_CARE_PROVIDER_SITE_OTHER): Payer: Medicare Other | Admitting: Pharmacist Clinician (PhC)/ Clinical Pharmacy Specialist

## 2014-02-13 DIAGNOSIS — I359 Nonrheumatic aortic valve disorder, unspecified: Secondary | ICD-10-CM

## 2014-02-13 DIAGNOSIS — Z7901 Long term (current) use of anticoagulants: Secondary | ICD-10-CM

## 2014-02-13 DIAGNOSIS — Z5181 Encounter for therapeutic drug level monitoring: Secondary | ICD-10-CM

## 2014-02-13 DIAGNOSIS — Z954 Presence of other heart-valve replacement: Secondary | ICD-10-CM

## 2014-02-13 LAB — POCT INR: INR: 2.6

## 2014-03-06 ENCOUNTER — Ambulatory Visit (INDEPENDENT_AMBULATORY_CARE_PROVIDER_SITE_OTHER): Payer: Medicare Other | Admitting: Cardiology

## 2014-03-06 ENCOUNTER — Encounter: Payer: Self-pay | Admitting: Cardiology

## 2014-03-06 VITALS — BP 136/74 | HR 80 | Ht 66.0 in | Wt 147.0 lb

## 2014-03-06 DIAGNOSIS — I482 Chronic atrial fibrillation, unspecified: Secondary | ICD-10-CM

## 2014-03-06 DIAGNOSIS — I4891 Unspecified atrial fibrillation: Secondary | ICD-10-CM | POA: Diagnosis not present

## 2014-03-06 DIAGNOSIS — I38 Endocarditis, valve unspecified: Secondary | ICD-10-CM | POA: Diagnosis not present

## 2014-03-06 DIAGNOSIS — Z7901 Long term (current) use of anticoagulants: Secondary | ICD-10-CM | POA: Diagnosis not present

## 2014-03-06 DIAGNOSIS — R609 Edema, unspecified: Secondary | ICD-10-CM | POA: Diagnosis not present

## 2014-03-06 MED ORDER — FUROSEMIDE 20 MG PO TABS: 20.0000 mg | ORAL_TABLET | Freq: Every day | ORAL | Status: DC

## 2014-03-06 NOTE — Patient Instructions (Signed)
Try taking Lasix 20 mg three times a week or as needed for swelling.   Continue your other therapy  I will see you in 6 months.

## 2014-03-06 NOTE — Progress Notes (Signed)
HPI:  [name] Chung is seen today for followup. She underwent redo open heart surgery in December 2012 at [facility]. This included redo aortic valve replacement with a Medtronic freestyle aortic root conduit. She also underwent tricuspid valve annuloplasty and repair. The mitral valve replacement was unchanged. She had initial surgery for rheumatic heart disease in 1982 with both aortic and mitral valve replacements with Bjork Shiley valves. She had repeat surgery in 1995 with a homograft aortic valve and a St. Jude mechanical mitral valve replacement.  On followup today she is feeling well. She denies any palpitations. She denies any chest pain or SOB.  She does complain of increased swelling in her hands and feet and feels bloated. She does wear support hose. She walks on a treadmill 5 days/week. Echo obtained from [facility] in January showed normal prosthetic valve function and normal EF.   Allergies  Allergen Reactions  . Asa Buff (Mag [Buffered Aspirin] Other (See Comments)    On coumadin  . Codeine   . Ultram [Tramadol Hcl]     Current Outpatient Prescriptions on File Prior to Visit  Medication Sig Dispense Refill  . butalbital-acetaminophen-caffeine (FIORICET, ESGIC) 50-325-40 MG per tablet 1 tablet 2 (two) times daily as needed.       . digoxin (LANOXIN) 0.125 MG tablet Take 1 tablet (0.125 mg total) by mouth daily.  90 tablet  3  . hydrochlorothiazide (HYDRODIURIL) 50 MG tablet Take 1 tablet (50 mg total) by mouth daily.  90 tablet  3  . LORazepam (ATIVAN) 1 MG tablet Take 1 mg by mouth as needed.        . potassium chloride (K-DUR) 10 MEQ tablet Take 10 mEq by mouth 2 (two) times daily.      . temazepam (RESTORIL) 30 MG capsule Take 30 mg by mouth daily.       Marland Kitchen warfarin (COUMADIN) 10 MG tablet Take 1 tablet (10 mg total) by mouth as directed.  105 tablet  1   No current facility-administered medications on file prior to visit.    Past Medical History  Diagnosis Date  .  Valvular heart disease   . Chronic atrial fibrillation   . Chronic anticoagulation   . Rheumatic heart disease   . Aortic insufficiency   . Arthritis   . Edema     Past Surgical History  Procedure Laterality Date  . Aortic valve replacement      1982, redo 1995 homograft, redo 2012 Medronic valve conduit-freestyle  . Mitral valve replacement      1982, redo 1995 St Jude  valve  . Back surgery    . Abdominal hysterectomy    . Cataract extraction w/ intraocular lens  implant, bilateral      Family History  Problem Relation Age of Onset  . Heart attack Mother   . Stroke Mother   . Diabetes Mother   . Other Father     CAR ACCIDENT    History   Social History  . Marital Status: Married    Spouse Name: N/A    Number of Children: 0  . Years of Education: N/A   Occupational History  . 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 Smoking status: Never Smoker   . Smokeless tobacco: Not on file  . Alcohol Use: No  . Drug Use: No  . Sexual Activity: Not on file   Other Topics Concern  . Not on file   Social History  Narrative  . No narrative on file    ROS  As noted in history of present illness. All other systems were reviewed and are negative.  PHYSICAL EXAM BP 136/74  Pulse 80  Ht 5\' 6"  (1.676 m)  Wt 147 lb (66.679 kg)  BMI 23.74 kg/m2 She is an elderly female in no acute distress. HEENT is unremarkable. Neck is supple no JVD, adenopathy, thyromegaly, or bruits. Carotid upstrokes are good. Lungs are clear. Cardiac exam reveals a good mechanical mitral valve click. There is a grade 2/6 systolic murmur heard best at the left sternal border. This radiates into the carotids. There is no diastolic murmur. PMI is normal. There is no S3. Abdomen soft and nontender without masses or bruits. Femoral and pedal pulses are 2+ and symmetric. She has 1+ edema. Skin is warm and dry. She is alert and oriented x3. Cranial nerves II through XII are  intact.  Laboratory data:  Echocardiogram performed 01/15 at [facility] showed normal LV function. The right ventricle is moderately dilated. The prosthetic aortic valves was functioning normally. The mechanical mitral valve prosthesis was functioning normally.  ASSESSMENT AND PLAN  1. Valvular heart disease status post redo aortic valve replacement x3 and redo mitral valve surgery x2. Valve function is good by exam. She is therapeutic on her anticoagulation.   2. Chronic anticoagulation. Continue coumadin.  3. Edema. On HCTZ 50 mg daily. I have given her a Rx for lasix to take 3 times a week or as needed for swelling. Continue sodium restriction.  4. Permanent atrial fibrillation.  She will continue on her current dose of digoxin. Rate is well controlled off of metoprolol.

## 2014-03-27 ENCOUNTER — Ambulatory Visit (INDEPENDENT_AMBULATORY_CARE_PROVIDER_SITE_OTHER): Payer: Medicare Other | Admitting: Pharmacist

## 2014-03-27 DIAGNOSIS — Z954 Presence of other heart-valve replacement: Secondary | ICD-10-CM | POA: Diagnosis not present

## 2014-03-27 DIAGNOSIS — I359 Nonrheumatic aortic valve disorder, unspecified: Secondary | ICD-10-CM

## 2014-03-27 DIAGNOSIS — Z5181 Encounter for therapeutic drug level monitoring: Secondary | ICD-10-CM | POA: Diagnosis not present

## 2014-03-27 DIAGNOSIS — Z7901 Long term (current) use of anticoagulants: Secondary | ICD-10-CM

## 2014-03-27 LAB — POCT INR: INR: 3.4

## 2014-04-19 ENCOUNTER — Other Ambulatory Visit: Payer: Self-pay | Admitting: Cardiology

## 2014-05-08 ENCOUNTER — Ambulatory Visit (INDEPENDENT_AMBULATORY_CARE_PROVIDER_SITE_OTHER): Payer: Medicare Other | Admitting: *Deleted

## 2014-05-08 ENCOUNTER — Ambulatory Visit: Payer: Medicare Other | Admitting: Pharmacist Clinician (PhC)/ Clinical Pharmacy Specialist

## 2014-05-08 DIAGNOSIS — Z5181 Encounter for therapeutic drug level monitoring: Secondary | ICD-10-CM

## 2014-05-08 DIAGNOSIS — Z954 Presence of other heart-valve replacement: Secondary | ICD-10-CM

## 2014-05-08 DIAGNOSIS — Z7901 Long term (current) use of anticoagulants: Secondary | ICD-10-CM | POA: Diagnosis not present

## 2014-05-08 DIAGNOSIS — I359 Nonrheumatic aortic valve disorder, unspecified: Secondary | ICD-10-CM

## 2014-05-08 LAB — POCT INR: INR: 4.1

## 2014-06-05 ENCOUNTER — Ambulatory Visit (INDEPENDENT_AMBULATORY_CARE_PROVIDER_SITE_OTHER): Payer: Medicare Other | Admitting: *Deleted

## 2014-06-05 DIAGNOSIS — I359 Nonrheumatic aortic valve disorder, unspecified: Secondary | ICD-10-CM

## 2014-06-05 DIAGNOSIS — Z111 Encounter for screening for respiratory tuberculosis: Secondary | ICD-10-CM | POA: Diagnosis not present

## 2014-06-05 DIAGNOSIS — Z7901 Long term (current) use of anticoagulants: Secondary | ICD-10-CM | POA: Diagnosis not present

## 2014-06-05 DIAGNOSIS — Z5181 Encounter for therapeutic drug level monitoring: Secondary | ICD-10-CM | POA: Diagnosis not present

## 2014-06-05 DIAGNOSIS — Z954 Presence of other heart-valve replacement: Secondary | ICD-10-CM | POA: Diagnosis not present

## 2014-06-05 LAB — POCT INR: INR: 2.9

## 2014-07-03 ENCOUNTER — Ambulatory Visit (INDEPENDENT_AMBULATORY_CARE_PROVIDER_SITE_OTHER): Payer: Medicare Other | Admitting: Pharmacist

## 2014-07-03 DIAGNOSIS — Z5181 Encounter for therapeutic drug level monitoring: Secondary | ICD-10-CM | POA: Diagnosis not present

## 2014-07-03 DIAGNOSIS — I359 Nonrheumatic aortic valve disorder, unspecified: Secondary | ICD-10-CM | POA: Diagnosis not present

## 2014-07-03 DIAGNOSIS — Z954 Presence of other heart-valve replacement: Secondary | ICD-10-CM | POA: Diagnosis not present

## 2014-07-03 DIAGNOSIS — Z7901 Long term (current) use of anticoagulants: Secondary | ICD-10-CM | POA: Diagnosis not present

## 2014-07-03 DIAGNOSIS — I4891 Unspecified atrial fibrillation: Secondary | ICD-10-CM | POA: Diagnosis not present

## 2014-07-03 DIAGNOSIS — R609 Edema, unspecified: Secondary | ICD-10-CM | POA: Diagnosis not present

## 2014-07-03 DIAGNOSIS — Z23 Encounter for immunization: Secondary | ICD-10-CM | POA: Diagnosis not present

## 2014-07-03 LAB — POCT INR: INR: 3.3

## 2014-07-24 ENCOUNTER — Telehealth: Payer: Self-pay | Admitting: Cardiology

## 2014-07-24 NOTE — Telephone Encounter (Signed)
Please call her tomorrow, pt said she did not want to talk to nobody but you.

## 2014-07-25 NOTE — Telephone Encounter (Signed)
Returned call to patient she stated she wanted to verify lanoxin dose.Stated she renewed insurance today and they questioned dose.Advised at 03/06/14 office visit with Dr.Jordan she was taking lanoxin 0.125 mg daily and he did not change dose.

## 2014-07-25 NOTE — Telephone Encounter (Signed)
Pt called again today

## 2014-08-07 ENCOUNTER — Ambulatory Visit (INDEPENDENT_AMBULATORY_CARE_PROVIDER_SITE_OTHER): Payer: Medicare Other | Admitting: Pharmacist Clinician (PhC)/ Clinical Pharmacy Specialist

## 2014-08-07 DIAGNOSIS — I359 Nonrheumatic aortic valve disorder, unspecified: Secondary | ICD-10-CM | POA: Diagnosis not present

## 2014-08-07 DIAGNOSIS — Z954 Presence of other heart-valve replacement: Secondary | ICD-10-CM | POA: Diagnosis not present

## 2014-08-07 DIAGNOSIS — Z7901 Long term (current) use of anticoagulants: Secondary | ICD-10-CM | POA: Diagnosis not present

## 2014-08-07 DIAGNOSIS — Z5181 Encounter for therapeutic drug level monitoring: Secondary | ICD-10-CM

## 2014-08-07 DIAGNOSIS — Z952 Presence of prosthetic heart valve: Secondary | ICD-10-CM

## 2014-08-07 LAB — POCT INR: INR: 2.9

## 2014-09-18 ENCOUNTER — Ambulatory Visit (INDEPENDENT_AMBULATORY_CARE_PROVIDER_SITE_OTHER): Payer: Medicare Other | Admitting: *Deleted

## 2014-09-18 ENCOUNTER — Ambulatory Visit (INDEPENDENT_AMBULATORY_CARE_PROVIDER_SITE_OTHER): Payer: Medicare Other | Admitting: Cardiology

## 2014-09-18 ENCOUNTER — Encounter: Payer: Self-pay | Admitting: Cardiology

## 2014-09-18 VITALS — BP 142/60 | HR 86 | Ht 65.5 in | Wt 142.7 lb

## 2014-09-18 DIAGNOSIS — Z952 Presence of prosthetic heart valve: Secondary | ICD-10-CM

## 2014-09-18 DIAGNOSIS — Z7901 Long term (current) use of anticoagulants: Secondary | ICD-10-CM

## 2014-09-18 DIAGNOSIS — Z954 Presence of other heart-valve replacement: Secondary | ICD-10-CM | POA: Diagnosis not present

## 2014-09-18 DIAGNOSIS — I482 Chronic atrial fibrillation, unspecified: Secondary | ICD-10-CM

## 2014-09-18 DIAGNOSIS — I38 Endocarditis, valve unspecified: Secondary | ICD-10-CM

## 2014-09-18 DIAGNOSIS — I359 Nonrheumatic aortic valve disorder, unspecified: Secondary | ICD-10-CM | POA: Diagnosis not present

## 2014-09-18 DIAGNOSIS — Z5181 Encounter for therapeutic drug level monitoring: Secondary | ICD-10-CM

## 2014-09-18 LAB — POCT INR: INR: 2.2

## 2014-09-18 NOTE — Patient Instructions (Signed)
Continue your current therapy  Good luck with your move.  I will see you in 6 months.

## 2014-09-18 NOTE — Progress Notes (Signed)
HPI:  [name] Chung is seen today for followup of valvular heart disease. She underwent redo open heart surgery in December 2012 at [facility]. This included redo aortic valve replacement with a Medtronic freestyle aortic root conduit. She also underwent tricuspid valve annuloplasty and repair. The mitral valve replacement was unchanged. She had initial surgery for rheumatic heart disease in 1982 with both aortic and mitral valve replacements with Bjork Shiley valves. She had repeat surgery in 1995 with a homograft aortic valve and a St. Jude mechanical mitral valve replacement. She is on chronic Coumadin. On followup today she is feeling well. She denies any palpitations. She denies any chest pain or SOB.  Her edema has improved and she takes lasix 3 days/week.  She walks on a treadmill 5 days/week. She is planning to move to [facility] in January in an apartment.   Allergies  Allergen Reactions  . Asa Buff (Mag [Buffered Aspirin] Other (See Comments)    On coumadin  . Codeine   . Ultram [Tramadol Hcl]     Current Outpatient Prescriptions on File Prior to Visit  Medication Sig Dispense Refill  . butalbital-acetaminophen-caffeine (FIORICET, ESGIC) 50-325-40 MG per tablet 1 tablet 2 (two) times daily as needed.     . digoxin (LANOXIN) 0.125 MG tablet Take 1 tablet (0.125 mg total) by mouth daily. 90 tablet 3  . furosemide (LASIX) 20 MG tablet Take 1 tablet (20 mg total) by mouth daily. 90 tablet 3  . hydrochlorothiazide (HYDRODIURIL) 50 MG tablet Take 1 tablet (50 mg total) by mouth daily. 90 tablet 3  . LORazepam (ATIVAN) 1 MG tablet Take 1 mg by mouth as needed.      . potassium chloride (K-DUR) 10 MEQ tablet Take 10 mEq by mouth 2 (two) times daily.    . temazepam (RESTORIL) 30 MG capsule Take 30 mg by mouth daily.     Marland Kitchen warfarin (COUMADIN) 10 MG tablet TAKE 1 TABLET BY MOUTH AS DIRECTED 105 tablet 1   No current facility-administered medications on file prior to visit.     Past Medical History  Diagnosis Date  . Valvular heart disease   . Chronic atrial fibrillation   . Chronic anticoagulation   . Rheumatic heart disease   . Aortic insufficiency   . Arthritis   . Edema     Past Surgical History  Procedure Laterality Date  . Aortic valve replacement      1982, redo 1995 homograft, redo 2012 Medronic valve conduit-freestyle  . Mitral valve replacement      1982, redo 1995 St Jude  valve  . Back surgery    . Abdominal hysterectomy    . Cataract extraction w/ intraocular lens  implant, bilateral      Family History  Problem Relation Age of Onset  . Heart attack Mother   . Stroke Mother   . Diabetes Mother   . Other Father     CAR ACCIDENT     History   Social History  . Marital Status: Married    Spouse Name: N/A    Number of Children: 0  . Years of Education: N/A   Occupational History  . 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 Smoking status: Never Smoker   . Smokeless tobacco: Not on file  . Alcohol Use: No  . Drug Use: No  . Sexual Activity: Not on file   Other Topics Concern  . Not on file  Social History Narrative    ROS  As noted in history of present illness. All other systems were reviewed and are negative.  PHYSICAL EXAM BP 142/60 mmHg  Pulse 86  Ht 5' 5.5" (1.664 m)  Wt 142 lb 11.2 oz (64.728 kg)  BMI 23.38 kg/m2 She is an elderly female in no acute distress. HEENT is unremarkable. Neck is supple no JVD, adenopathy, thyromegaly, or bruits. Carotid upstrokes are good. Lungs are clear. Cardiac exam reveals a good mechanical mitral valve click. There is a grade 8-6/7 systolic murmur heard best at the left sternal border. This radiates into the carotids. There is no diastolic murmur. PMI is normal. There is no S3. Abdomen soft and nontender without masses or bruits. Femoral and pedal pulses are 2+ and symmetric. She has tr edema. Skin is warm and dry. She is alert and oriented x3. Cranial  nerves II through XII are intact.  Laboratory data:  Echocardiogram performed 01/15 at [facility] showed normal LV function. The right ventricle is moderately dilated. The prosthetic aortic valves was functioning normally. The mechanical mitral valve prosthesis was functioning normally.  ASSESSMENT AND PLAN  1. Valvular heart disease status post redo aortic valve replacement x3 and redo mitral valve surgery x2. Valve function is good by exam. Will check INR today.  2. Chronic anticoagulation. Continue coumadin.  3. Edema. On HCTZ 50 mg daily. Continue lasix as needed. Continue sodium restriction.  4. Permanent atrial fibrillation.  She will continue on her current dose of digoxin. Rate is well controlled off of metoprolol.   I will obtain a copy of her lab work from Dr. Felipa Eth. Will follow up in 6 months.

## 2014-10-12 ENCOUNTER — Other Ambulatory Visit: Payer: Self-pay | Admitting: Cardiology

## 2014-10-29 ENCOUNTER — Telehealth: Payer: Self-pay | Admitting: Cardiology

## 2014-10-29 MED ORDER — DIGOXIN 125 MCG PO TABS: 0.1250 mg | ORAL_TABLET | Freq: Every day | ORAL | Status: DC

## 2014-10-29 MED ORDER — POTASSIUM CHLORIDE ER 10 MEQ PO TBCR: 10.0000 meq | EXTENDED_RELEASE_TABLET | Freq: Two times a day (BID) | ORAL | Status: DC

## 2014-10-29 MED ORDER — FUROSEMIDE 20 MG PO TABS: 20.0000 mg | ORAL_TABLET | Freq: Every day | ORAL | Status: DC

## 2014-10-29 MED ORDER — HYDROCHLOROTHIAZIDE 50 MG PO TABS: 50.0000 mg | ORAL_TABLET | Freq: Every day | ORAL | Status: DC

## 2014-10-29 NOTE — Telephone Encounter (Signed)
Spoke to patient unable to escribe prescriptions to [facility] fax prescriptions.

## 2014-10-29 NOTE — Telephone Encounter (Signed)
Returned call to patient she stated she will need refills on medication sent to her new pharmacy.Stated send refills to [facility] phone # [phone].Fax # [phone].Refills sent to [facility].

## 2014-10-29 NOTE — Telephone Encounter (Signed)
Pt says her pharmacy have changed,they told her to notify her doctor. Her new pharmacy is [facility]-Fax#-[phone].Need you to fax her prescriptions to them.Please call.

## 2014-10-30 ENCOUNTER — Ambulatory Visit (INDEPENDENT_AMBULATORY_CARE_PROVIDER_SITE_OTHER): Payer: Medicare Other | Admitting: Pharmacist Clinician (PhC)/ Clinical Pharmacy Specialist

## 2014-10-30 DIAGNOSIS — Z954 Presence of other heart-valve replacement: Secondary | ICD-10-CM | POA: Diagnosis not present

## 2014-10-30 DIAGNOSIS — Z7901 Long term (current) use of anticoagulants: Secondary | ICD-10-CM | POA: Diagnosis not present

## 2014-10-30 DIAGNOSIS — Z5181 Encounter for therapeutic drug level monitoring: Secondary | ICD-10-CM | POA: Diagnosis not present

## 2014-10-30 DIAGNOSIS — I359 Nonrheumatic aortic valve disorder, unspecified: Secondary | ICD-10-CM | POA: Diagnosis not present

## 2014-10-30 DIAGNOSIS — Z952 Presence of prosthetic heart valve: Secondary | ICD-10-CM

## 2014-10-30 LAB — POCT INR: INR: 2.4

## 2014-11-02 NOTE — Telephone Encounter (Signed)
90 day prescriptions for kdur,hctz,lasix,lanoxin faxed to [facility] at fax # [phone].

## 2014-11-13 DIAGNOSIS — Z1231 Encounter for screening mammogram for malignant neoplasm of breast: Secondary | ICD-10-CM | POA: Diagnosis not present

## 2014-11-15 DIAGNOSIS — H40013 Open angle with borderline findings, low risk, bilateral: Secondary | ICD-10-CM | POA: Diagnosis not present

## 2014-11-27 ENCOUNTER — Ambulatory Visit (INDEPENDENT_AMBULATORY_CARE_PROVIDER_SITE_OTHER): Payer: Medicare Other | Admitting: *Deleted

## 2014-11-27 DIAGNOSIS — Z5181 Encounter for therapeutic drug level monitoring: Secondary | ICD-10-CM

## 2014-11-27 DIAGNOSIS — Z952 Presence of prosthetic heart valve: Secondary | ICD-10-CM

## 2014-11-27 DIAGNOSIS — Z954 Presence of other heart-valve replacement: Secondary | ICD-10-CM | POA: Diagnosis not present

## 2014-11-27 DIAGNOSIS — I359 Nonrheumatic aortic valve disorder, unspecified: Secondary | ICD-10-CM

## 2014-11-27 DIAGNOSIS — Z7901 Long term (current) use of anticoagulants: Secondary | ICD-10-CM

## 2014-11-27 LAB — POCT INR: INR: 3.8

## 2014-12-25 ENCOUNTER — Other Ambulatory Visit: Payer: Self-pay

## 2014-12-25 ENCOUNTER — Ambulatory Visit (INDEPENDENT_AMBULATORY_CARE_PROVIDER_SITE_OTHER): Payer: Medicare Other | Admitting: *Deleted

## 2014-12-25 ENCOUNTER — Telehealth: Payer: Self-pay | Admitting: Cardiology

## 2014-12-25 DIAGNOSIS — Z954 Presence of other heart-valve replacement: Secondary | ICD-10-CM | POA: Diagnosis not present

## 2014-12-25 DIAGNOSIS — I359 Nonrheumatic aortic valve disorder, unspecified: Secondary | ICD-10-CM

## 2014-12-25 DIAGNOSIS — Z7901 Long term (current) use of anticoagulants: Secondary | ICD-10-CM | POA: Diagnosis not present

## 2014-12-25 DIAGNOSIS — Z5181 Encounter for therapeutic drug level monitoring: Secondary | ICD-10-CM | POA: Diagnosis not present

## 2014-12-25 DIAGNOSIS — Z952 Presence of prosthetic heart valve: Secondary | ICD-10-CM

## 2014-12-25 LAB — POCT INR: INR: 3.5

## 2014-12-25 MED ORDER — POTASSIUM CHLORIDE ER 10 MEQ PO TBCR: 10.0000 meq | EXTENDED_RELEASE_TABLET | Freq: Two times a day (BID) | ORAL | Status: DC

## 2014-12-25 MED ORDER — DIGOXIN 125 MCG PO TABS: 0.1250 mg | ORAL_TABLET | Freq: Every day | ORAL | Status: DC

## 2014-12-25 MED ORDER — HYDROCHLOROTHIAZIDE 50 MG PO TABS
50.0000 mg | ORAL_TABLET | Freq: Every day | ORAL | Status: DC
Start: 2014-12-25 — End: 2014-12-28

## 2014-12-25 MED ORDER — FUROSEMIDE 20 MG PO TABS: 20.0000 mg | ORAL_TABLET | Freq: Every day | ORAL | Status: DC

## 2014-12-25 NOTE — Telephone Encounter (Signed)
Returned call to patient she stated Medicare has changed her pharmacy to [facility].Stated they will be faxing refills today.Stated she wanted [place] to be aware.

## 2014-12-25 NOTE — Telephone Encounter (Signed)
Please call,pt say Medicare have changed her pharmacy plan again.[name] Chung

## 2014-12-26 ENCOUNTER — Other Ambulatory Visit: Payer: Self-pay | Admitting: Pharmacist

## 2014-12-26 MED ORDER — WARFARIN SODIUM 10 MG PO TABS: 10.0000 mg | ORAL_TABLET | Freq: Every day | ORAL | Status: DC

## 2014-12-28 ENCOUNTER — Other Ambulatory Visit: Payer: Self-pay

## 2014-12-28 MED ORDER — DIGOXIN 125 MCG PO TABS: 0.1250 mg | ORAL_TABLET | Freq: Every day | ORAL | Status: DC

## 2014-12-28 MED ORDER — FUROSEMIDE 20 MG PO TABS: 20.0000 mg | ORAL_TABLET | Freq: Every day | ORAL | Status: DC

## 2014-12-28 MED ORDER — HYDROCHLOROTHIAZIDE 50 MG PO TABS: 50.0000 mg | ORAL_TABLET | Freq: Every day | ORAL | Status: DC

## 2014-12-28 NOTE — Telephone Encounter (Signed)
Rx sent to [facility] on 3/23 and receipt confirmed.  Spoke with pt.  She spoke with [facility] and they sent Rx today.

## 2015-01-08 ENCOUNTER — Other Ambulatory Visit: Payer: Self-pay

## 2015-01-08 DIAGNOSIS — Z0000 Encounter for general adult medical examination without abnormal findings: Secondary | ICD-10-CM | POA: Diagnosis not present

## 2015-01-08 DIAGNOSIS — E780 Pure hypercholesterolemia: Secondary | ICD-10-CM | POA: Diagnosis not present

## 2015-01-08 DIAGNOSIS — M8580 Other specified disorders of bone density and structure, unspecified site: Secondary | ICD-10-CM | POA: Diagnosis not present

## 2015-01-08 DIAGNOSIS — I099 Rheumatic heart disease, unspecified: Secondary | ICD-10-CM | POA: Diagnosis not present

## 2015-01-08 DIAGNOSIS — Z79899 Other long term (current) drug therapy: Secondary | ICD-10-CM | POA: Diagnosis not present

## 2015-01-08 DIAGNOSIS — Z1389 Encounter for screening for other disorder: Secondary | ICD-10-CM | POA: Diagnosis not present

## 2015-01-08 DIAGNOSIS — I272 Other secondary pulmonary hypertension: Secondary | ICD-10-CM | POA: Diagnosis not present

## 2015-01-08 DIAGNOSIS — I480 Paroxysmal atrial fibrillation: Secondary | ICD-10-CM | POA: Diagnosis not present

## 2015-01-08 MED ORDER — WARFARIN SODIUM 10 MG PO TABS: ORAL_TABLET | ORAL | Status: DC

## 2015-01-17 ENCOUNTER — Ambulatory Visit (INDEPENDENT_AMBULATORY_CARE_PROVIDER_SITE_OTHER): Payer: Medicare Other

## 2015-01-17 ENCOUNTER — Ambulatory Visit (INDEPENDENT_AMBULATORY_CARE_PROVIDER_SITE_OTHER): Payer: Medicare Other | Admitting: Family Medicine

## 2015-01-17 VITALS — BP 132/60 | HR 108 | Temp 98.1°F | Ht 65.0 in | Wt 146.0 lb

## 2015-01-17 DIAGNOSIS — Z954 Presence of other heart-valve replacement: Secondary | ICD-10-CM

## 2015-01-17 DIAGNOSIS — R059 Cough, unspecified: Secondary | ICD-10-CM

## 2015-01-17 DIAGNOSIS — R05 Cough: Secondary | ICD-10-CM | POA: Diagnosis not present

## 2015-01-17 DIAGNOSIS — R509 Fever, unspecified: Secondary | ICD-10-CM | POA: Diagnosis not present

## 2015-01-17 DIAGNOSIS — J22 Unspecified acute lower respiratory infection: Secondary | ICD-10-CM

## 2015-01-17 DIAGNOSIS — J988 Other specified respiratory disorders: Secondary | ICD-10-CM | POA: Diagnosis not present

## 2015-01-17 DIAGNOSIS — Z7901 Long term (current) use of anticoagulants: Secondary | ICD-10-CM | POA: Diagnosis not present

## 2015-01-17 DIAGNOSIS — Z952 Presence of prosthetic heart valve: Secondary | ICD-10-CM

## 2015-01-17 LAB — POCT CBC
Granulocyte percent: 50.5 %G (ref 37–80)
HCT, POC: 35.7 % — AB (ref 37.7–47.9)
HEMOGLOBIN: 11.5 g/dL — AB (ref 12.2–16.2)
Lymph, poc: 2.2 (ref 0.6–3.4)
MCH, POC: 31.2 pg (ref 27–31.2)
MCHC: 32.4 g/dL (ref 31.8–35.4)
MCV: 96.2 fL (ref 80–97)
MID (CBC): 0.6 (ref 0–0.9)
MPV: 7.4 fL (ref 0–99.8)
PLATELET COUNT, POC: 178 10*3/uL (ref 142–424)
POC GRANULOCYTE: 2.9 (ref 2–6.9)
POC LYMPH PERCENT: 38.5 %L (ref 10–50)
POC MID %: 11 % (ref 0–12)
RBC: 3.71 M/uL — AB (ref 4.04–5.48)
RDW, POC: 14.8 %
WBC: 5.8 10*3/uL (ref 4.6–10.2)

## 2015-01-17 LAB — POCT INFLUENZA A/B
INFLUENZA B, POC: NEGATIVE
Influenza A, POC: NEGATIVE

## 2015-01-17 MED ORDER — BENZONATATE 100 MG PO CAPS: 100.0000 mg | ORAL_CAPSULE | Freq: Three times a day (TID) | ORAL | Status: DC | PRN

## 2015-01-17 MED ORDER — LEVOFLOXACIN 500 MG PO TABS: 500.0000 mg | ORAL_TABLET | Freq: Every day | ORAL | Status: DC

## 2015-01-17 NOTE — Progress Notes (Addendum)
Patient ID: [name] Chung, female   DOB: [birth year], 81 y.o.   MRN: [id number]   Subjective:  This chart was scribed for [name] Forts, MD by [facility], medical scribe at [facility].The patient was seen in exam room 09 and the patient's care was started at 7:51 PM.   Patient ID: [name] Chung, female    DOB: 03-03-1933, 81 y.o.   MRN: [id number]  01/17/2015  Cough; Chest Pain; and Fever  HPI HPI Comments: [name] is a 81 y.o. female who presents to [facility] complaining of recurrent productive cough acute onset four days ago.  She has chest tightness, sinus congestion, rhinorrhea and sore throat.  She is producing a dark yellow mucous.from chest and nose.   Pt had a fever of 101.6 and sweating four days ago but this has improved. She is taking OTC tussin. Chronic headache that is worse this week. Pt had sick contacts at church. Pt has a history of chest infections typically takes Levaquin with excellent results. Pt has gotten the flu shot this year. Her 3rd set of valves have been replaced, last surgery December 2012. She also had a post-operative stroke but no lasting effects. She has never had a heart attack. She denies ear pain, worsening SOB, nausea, vomiting, diarrhea, chest pain, and abnormal leg swelling.  Maintained on coumadin therapy for valve replacement and atrial fibrillation.  Chronic leg swelling is at baseline.   Review of Systems  Constitutional: Positive for fever. Negative for chills, diaphoresis and fatigue.  HENT: Positive for congestion, rhinorrhea and sore throat. Negative for ear pain, postnasal drip, sinus pressure, trouble swallowing and voice change.   Respiratory: Positive for cough and chest tightness. Negative for shortness of breath and wheezing.   Cardiovascular: Negative for chest pain, palpitations and leg swelling.  Gastrointestinal: Negative for nausea, vomiting, abdominal pain, diarrhea and constipation.  Skin:  Negative for rash.  Neurological: Positive for headaches.    Past Medical History  Diagnosis Date   Valvular heart disease    Chronic atrial fibrillation    Chronic anticoagulation    Rheumatic heart disease    Aortic insufficiency    Arthritis    Edema    Past Surgical History  Procedure Laterality Date   Aortic valve replacement      1982, redo 1995 homograft, redo 2012 Medronic valve conduit-freestyle   Mitral valve replacement      1982, redo 1995 St Jude  valve   Back surgery     Abdominal hysterectomy     Cataract extraction w/ intraocular lens  implant, bilateral     Allergies  Allergen Reactions   Asa Buff (Mag [Buffered Aspirin] Other (See Comments)    On coumadin   Codeine    Ultram [Tramadol Hcl]    Current Outpatient Prescriptions  Medication Sig Dispense Refill   butalbital-acetaminophen-caffeine (FIORICET, ESGIC) 50-325-40 MG per tablet 1 tablet 2 (two) times daily as needed.      digoxin (LANOXIN) 0.125 MG tablet Take 1 tablet (0.125 mg total) by mouth daily. 90 tablet 3   furosemide (LASIX) 20 MG tablet Take 1 tablet (20 mg total) by mouth daily. 90 tablet 3   hydrochlorothiazide (HYDRODIURIL) 50 MG tablet Take 1 tablet (50 mg total) by mouth daily. 90 tablet 3   LORazepam (ATIVAN) 1 MG tablet Take 1 mg by mouth as needed.       potassium chloride (K-DUR) 10 MEQ tablet Take 1  tablet (10 mEq total) by mouth 2 (two) times daily. 180 tablet 3   temazepam (RESTORIL) 30 MG capsule Take 30 mg by mouth daily.      warfarin (COUMADIN) 10 MG tablet As directed by anticoagulation clinic 120 tablet 1   benzonatate (TESSALON) 100 MG capsule Take 1-2 capsules (100-200 mg total) by mouth 3 (three) times daily as needed for cough. 40 capsule 0   levofloxacin (LEVAQUIN) 500 MG tablet Take 1 tablet (500 mg total) by mouth daily. 7 tablet 0   No current facility-administered medications for this visit.      Objective:    BP 132/60 mmHg   Pulse  108   Temp(Src) 98.1 F (36.7 C) (Oral)   Ht 5\' 5"  (1.651 m)   Wt 146 lb (66.225 kg)   BMI 24.30 kg/m2   SpO2 93% Physical Exam  Constitutional: She is oriented to person, place, and time. She appears well-developed and well-nourished. No distress.  HENT:  Head: Normocephalic and atraumatic.  Right Ear: Tympanic membrane, external ear and ear canal normal.  Left Ear: Tympanic membrane, external ear and ear canal normal.  Nose: Nose normal.  Mouth/Throat: Oropharynx is clear and moist.  Eyes: Conjunctivae and EOM are normal. Pupils are equal, round, and reactive to light.  Neck: Normal range of motion. Neck supple. Carotid bruit is not present. No thyromegaly present.  Cardiovascular: Normal rate, regular rhythm, normal heart sounds and intact distal pulses.  Exam reveals no gallop and no friction rub.   No murmur heard. Regular rate and rhythm with a click.  1+ pitting edema BLE.  Pulmonary/Chest: Effort normal and breath sounds normal. She has no wheezes. She has no rales.  Lymphadenopathy:    She has no cervical adenopathy.  Neurological: She is alert and oriented to person, place, and time. No cranial nerve deficit.  Skin: Skin is warm and dry. No rash noted. She is not diaphoretic. No erythema. No pallor.  Psychiatric: She has a normal mood and affect. Her behavior is normal.    Results for orders placed or performed in visit on 01/17/15  POCT CBC  Result Value Ref Range   WBC 5.8 4.6 - 10.2 K/uL   Lymph, poc 2.2 0.6 - 3.4   POC LYMPH PERCENT 38.5 10 - 50 %L   MID (cbc) 0.6 0 - 0.9   POC MID % 11.0 0 - 12 %M   POC Granulocyte 2.9 2 - 6.9   Granulocyte percent 50.5 37 - 80 %G   RBC 3.71 (A) 4.04 - 5.48 M/uL   Hemoglobin 11.5 (A) 12.2 - 16.2 g/dL   HCT, POC 35.7 (A) 37.7 - 47.9 %   MCV 96.2 80 - 97 fL   MCH, POC 31.2 27 - 31.2 pg   MCHC 32.4 31.8 - 35.4 g/dL   RDW, POC 14.8 %   Platelet Count, POC 178 142 - 424 K/uL   MPV 7.4 0 - 99.8 fL  POCT Influenza A/B  Result Value  Ref Range   Influenza A, POC Negative    Influenza B, POC Negative      [facility] reading (PRIMARY) by  Dr. Tamala Julian.  CXR:  L lower infiltrate   Assessment & Plan:   1. Lower respiratory infection   2. Other specified fever   3. Cough   4. S/P MVR (mitral valve replacement)   5. Long term current use of anticoagulant therapy     -New. -Rx for Levaquin and Tessalon Perles provided. -Recommend  PT/ INR in upcoming week due to abx use. -RTC for acute worsening.     Meds ordered this encounter  Medications   levofloxacin (LEVAQUIN) 500 MG tablet    Sig: Take 1 tablet (500 mg total) by mouth daily.    Dispense:  7 tablet    Refill:  0   benzonatate (TESSALON) 100 MG capsule    Sig: Take 1-2 capsules (100-200 mg total) by mouth 3 (three) times daily as needed for cough.    Dispense:  40 capsule    Refill:  0    No Follow-up on file.  I personally performed the services described in this documentation, which was scribed in my presence. The recorded information has been reviewed and considered.  Kristi Elayne Guerin, M.D. [facility] [address] [place], [place]  [address] [phone] phone [phone] fax

## 2015-01-17 NOTE — Patient Instructions (Signed)

## 2015-01-22 ENCOUNTER — Ambulatory Visit (INDEPENDENT_AMBULATORY_CARE_PROVIDER_SITE_OTHER): Payer: Medicare Other | Admitting: *Deleted

## 2015-01-22 DIAGNOSIS — Z954 Presence of other heart-valve replacement: Secondary | ICD-10-CM

## 2015-01-22 DIAGNOSIS — I359 Nonrheumatic aortic valve disorder, unspecified: Secondary | ICD-10-CM

## 2015-01-22 DIAGNOSIS — Z7901 Long term (current) use of anticoagulants: Secondary | ICD-10-CM | POA: Diagnosis not present

## 2015-01-22 DIAGNOSIS — Z952 Presence of prosthetic heart valve: Secondary | ICD-10-CM

## 2015-01-22 DIAGNOSIS — Z5181 Encounter for therapeutic drug level monitoring: Secondary | ICD-10-CM

## 2015-01-22 LAB — POCT INR: INR: 3.8

## 2015-02-05 ENCOUNTER — Ambulatory Visit (INDEPENDENT_AMBULATORY_CARE_PROVIDER_SITE_OTHER): Payer: Medicare Other | Admitting: *Deleted

## 2015-02-05 DIAGNOSIS — Z952 Presence of prosthetic heart valve: Secondary | ICD-10-CM

## 2015-02-05 DIAGNOSIS — I359 Nonrheumatic aortic valve disorder, unspecified: Secondary | ICD-10-CM | POA: Diagnosis not present

## 2015-02-05 DIAGNOSIS — Z7901 Long term (current) use of anticoagulants: Secondary | ICD-10-CM | POA: Diagnosis not present

## 2015-02-05 DIAGNOSIS — Z954 Presence of other heart-valve replacement: Secondary | ICD-10-CM | POA: Diagnosis not present

## 2015-02-05 DIAGNOSIS — Z5181 Encounter for therapeutic drug level monitoring: Secondary | ICD-10-CM | POA: Diagnosis not present

## 2015-02-05 LAB — POCT INR: INR: 5.3

## 2015-02-26 ENCOUNTER — Ambulatory Visit (INDEPENDENT_AMBULATORY_CARE_PROVIDER_SITE_OTHER): Payer: Medicare Other | Admitting: *Deleted

## 2015-02-26 DIAGNOSIS — Z5181 Encounter for therapeutic drug level monitoring: Secondary | ICD-10-CM

## 2015-02-26 DIAGNOSIS — Z7901 Long term (current) use of anticoagulants: Secondary | ICD-10-CM

## 2015-02-26 DIAGNOSIS — Z952 Presence of prosthetic heart valve: Secondary | ICD-10-CM

## 2015-02-26 DIAGNOSIS — I359 Nonrheumatic aortic valve disorder, unspecified: Secondary | ICD-10-CM | POA: Diagnosis not present

## 2015-02-26 DIAGNOSIS — Z954 Presence of other heart-valve replacement: Secondary | ICD-10-CM | POA: Diagnosis not present

## 2015-02-26 LAB — POCT INR: INR: 2.4

## 2015-03-13 ENCOUNTER — Encounter: Payer: Self-pay | Admitting: Cardiology

## 2015-03-13 ENCOUNTER — Ambulatory Visit (INDEPENDENT_AMBULATORY_CARE_PROVIDER_SITE_OTHER): Payer: Medicare Other | Admitting: Cardiology

## 2015-03-13 VITALS — BP 142/62 | HR 104 | Ht 65.0 in | Wt 146.0 lb

## 2015-03-13 DIAGNOSIS — I482 Chronic atrial fibrillation, unspecified: Secondary | ICD-10-CM

## 2015-03-13 DIAGNOSIS — Z7901 Long term (current) use of anticoagulants: Secondary | ICD-10-CM | POA: Diagnosis not present

## 2015-03-13 DIAGNOSIS — I4891 Unspecified atrial fibrillation: Secondary | ICD-10-CM | POA: Diagnosis not present

## 2015-03-13 DIAGNOSIS — I38 Endocarditis, valve unspecified: Secondary | ICD-10-CM | POA: Diagnosis not present

## 2015-03-13 DIAGNOSIS — Z954 Presence of other heart-valve replacement: Secondary | ICD-10-CM

## 2015-03-13 DIAGNOSIS — Z952 Presence of prosthetic heart valve: Secondary | ICD-10-CM | POA: Diagnosis not present

## 2015-03-13 NOTE — Patient Instructions (Signed)
We will check lab work today  Continue your current therapy  I will see you in 6 months.   

## 2015-03-13 NOTE — Progress Notes (Signed)
HPI:  [name] Chung is seen today for followup of valvular heart disease. She underwent redo open heart surgery in December 2012 at [facility]. This included redo aortic valve replacement with a Medtronic freestyle aortic root conduit. She also underwent tricuspid valve annuloplasty and repair. The mitral valve replacement was unchanged. She had initial surgery for rheumatic heart disease in 1982 with both aortic and mitral valve replacements with Bjork Shiley valves. She had repeat surgery in 1995 with a homograft aortic valve and a St. Jude mechanical mitral valve replacement. She is on chronic Coumadin. On followup today she is feeling well. She denies any palpitations. She denies any chest pain or SOB.  Her edema has improved and she takes lasix 3 days/week.  She has moved to [facility]. She has gained 4 lbs since there.  Allergies  Allergen Reactions  . Asa Buff (Mag [Buffered Aspirin] Other (See Comments)    On coumadin  . Codeine   . Ultram [Tramadol Hcl]     Current Outpatient Prescriptions on File Prior to Visit  Medication Sig Dispense Refill  . butalbital-acetaminophen-caffeine (FIORICET, ESGIC) 50-325-40 MG per tablet 1 tablet 2 (two) times daily as needed.     . digoxin (LANOXIN) 0.125 MG tablet Take 1 tablet (0.125 mg total) by mouth daily. 90 tablet 3  . furosemide (LASIX) 20 MG tablet Take 1 tablet (20 mg total) by mouth daily. 90 tablet 3  . hydrochlorothiazide (HYDRODIURIL) 50 MG tablet Take 1 tablet (50 mg total) by mouth daily. 90 tablet 3  . LORazepam (ATIVAN) 1 MG tablet Take 1 mg by mouth as needed.      . potassium chloride (K-DUR) 10 MEQ tablet Take 1 tablet (10 mEq total) by mouth 2 (two) times daily. 180 tablet 3  . temazepam (RESTORIL) 30 MG capsule Take 30 mg by mouth daily.     Marland Kitchen warfarin (COUMADIN) 10 MG tablet As directed by anticoagulation clinic 120 tablet 1   No current facility-administered medications on file prior to visit.    Past  Medical History  Diagnosis Date  . Valvular heart disease   . Chronic atrial fibrillation   . Chronic anticoagulation   . Rheumatic heart disease   . Aortic insufficiency   . Arthritis   . Edema     Past Surgical History  Procedure Laterality Date  . Aortic valve replacement      1982, redo 1995 homograft, redo 2012 Medronic valve conduit-freestyle  . Mitral valve replacement      1982, redo 1995 St Jude  valve  . Back surgery    . Abdominal hysterectomy    . Cataract extraction w/ intraocular lens  implant, bilateral      Family History  Problem Relation Age of Onset  . Heart attack Mother   . Stroke Mother   . Diabetes Mother   . Other Father     CAR ACCIDENT     History   Social History  . Marital Status: Married    Spouse Name: N/A  . Number of Children: 0  . Years of Education: N/A   Occupational History  . 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 Smoking status: Never Smoker   . Smokeless tobacco: Not on file  . Alcohol Use: No  . Drug Use: No  . Sexual Activity: Not on file   Other Topics Concern  . Not on file   Social History Narrative  ROS  As noted in history of present illness. All other systems were reviewed and are negative.  PHYSICAL EXAM BP 142/62 mmHg  Pulse 104  Ht 5\' 5"  (1.651 m)  Wt 66.225 kg (146 lb)  BMI 24.30 kg/m2 She is an elderly female in no acute distress. HEENT is unremarkable. Neck is supple no JVD, adenopathy, thyromegaly, or bruits. Carotid upstrokes are good. Lungs are clear. Cardiac exam reveals a good mechanical mitral valve click. There is a grade 2-3/3 systolic murmur heard best at the left sternal border. This radiates into the carotids. There is no diastolic murmur. PMI is normal. There is no S3. Abdomen soft and nontender without masses or bruits. Femoral and pedal pulses are 2+ and symmetric. She has tr edema. Skin is warm and dry. She is alert and oriented x3. Cranial nerves II through XII  are intact.  Laboratory data:  Echocardiogram performed 01/15 at [facility] showed normal LV function. The right ventricle is moderately dilated. The prosthetic aortic valves was functioning normally. The mechanical mitral valve prosthesis was functioning normally.  Ecg today shows Accelerated junctional rhythm with rate 104 bpm. Occ. PVCs. Diffuse ST abnormality consistent with Dig effect. I have personally reviewed and interpreted this study.   ASSESSMENT AND PLAN  1. Valvular heart disease status post redo aortic valve replacement x3 and redo mitral valve surgery x2. Valve function is good by exam. Continue on coumadin. SBE prophylaxis.   2. Chronic anticoagulation. Continue coumadin.  3. Edema. On HCTZ 50 mg daily. Continue lasix at current dose.  Continue sodium restriction.  4. Permanent atrial fibrillation.  Ecg shows more of an accelerated junctional rhythm today. Will check Dig level to make sure she is not toxic. Check BMET. She is asymptomatic.   I will obtain a copy of her lab work from Dr. Felipa Eth. Will follow up in 6 months.

## 2015-03-14 LAB — DIGOXIN LEVEL: Digoxin Level: 0.4 ng/mL — ABNORMAL LOW (ref 0.8–2.0)

## 2015-03-14 LAB — BASIC METABOLIC PANEL
BUN: 17 mg/dL (ref 6–23)
CO2: 30 mEq/L (ref 19–32)
Calcium: 9.2 mg/dL (ref 8.4–10.5)
Chloride: 94 mEq/L — ABNORMAL LOW (ref 96–112)
Creat: 0.67 mg/dL (ref 0.50–1.10)
GLUCOSE: 86 mg/dL (ref 70–99)
POTASSIUM: 3.5 meq/L (ref 3.5–5.3)
Sodium: 137 mEq/L (ref 135–145)

## 2015-03-22 DIAGNOSIS — M2012 Hallux valgus (acquired), left foot: Secondary | ICD-10-CM | POA: Diagnosis not present

## 2015-03-22 DIAGNOSIS — M71572 Other bursitis, not elsewhere classified, left ankle and foot: Secondary | ICD-10-CM | POA: Diagnosis not present

## 2015-03-22 DIAGNOSIS — M79672 Pain in left foot: Secondary | ICD-10-CM | POA: Diagnosis not present

## 2015-03-26 ENCOUNTER — Ambulatory Visit (INDEPENDENT_AMBULATORY_CARE_PROVIDER_SITE_OTHER): Payer: Medicare Other | Admitting: *Deleted

## 2015-03-26 DIAGNOSIS — Z5181 Encounter for therapeutic drug level monitoring: Secondary | ICD-10-CM

## 2015-03-26 DIAGNOSIS — Z7901 Long term (current) use of anticoagulants: Secondary | ICD-10-CM

## 2015-03-26 DIAGNOSIS — Z954 Presence of other heart-valve replacement: Secondary | ICD-10-CM | POA: Diagnosis not present

## 2015-03-26 DIAGNOSIS — I359 Nonrheumatic aortic valve disorder, unspecified: Secondary | ICD-10-CM

## 2015-03-26 DIAGNOSIS — Z952 Presence of prosthetic heart valve: Secondary | ICD-10-CM

## 2015-03-26 LAB — POCT INR: INR: 2.9

## 2015-04-23 ENCOUNTER — Ambulatory Visit (INDEPENDENT_AMBULATORY_CARE_PROVIDER_SITE_OTHER): Payer: Medicare Other | Admitting: *Deleted

## 2015-04-23 DIAGNOSIS — I359 Nonrheumatic aortic valve disorder, unspecified: Secondary | ICD-10-CM | POA: Diagnosis not present

## 2015-04-23 DIAGNOSIS — Z952 Presence of prosthetic heart valve: Secondary | ICD-10-CM

## 2015-04-23 DIAGNOSIS — Z954 Presence of other heart-valve replacement: Secondary | ICD-10-CM

## 2015-04-23 DIAGNOSIS — Z7901 Long term (current) use of anticoagulants: Secondary | ICD-10-CM

## 2015-04-23 DIAGNOSIS — Z5181 Encounter for therapeutic drug level monitoring: Secondary | ICD-10-CM | POA: Diagnosis not present

## 2015-04-23 LAB — POCT INR: INR: 3.5

## 2015-05-03 DIAGNOSIS — C44729 Squamous cell carcinoma of skin of left lower limb, including hip: Secondary | ICD-10-CM | POA: Diagnosis not present

## 2015-05-03 DIAGNOSIS — D2272 Melanocytic nevi of left lower limb, including hip: Secondary | ICD-10-CM | POA: Diagnosis not present

## 2015-05-03 DIAGNOSIS — D485 Neoplasm of uncertain behavior of skin: Secondary | ICD-10-CM | POA: Diagnosis not present

## 2015-05-03 DIAGNOSIS — L570 Actinic keratosis: Secondary | ICD-10-CM | POA: Diagnosis not present

## 2015-05-10 DIAGNOSIS — C44729 Squamous cell carcinoma of skin of left lower limb, including hip: Secondary | ICD-10-CM | POA: Diagnosis not present

## 2015-05-16 DIAGNOSIS — D0472 Carcinoma in situ of skin of left lower limb, including hip: Secondary | ICD-10-CM | POA: Diagnosis not present

## 2015-05-16 DIAGNOSIS — L578 Other skin changes due to chronic exposure to nonionizing radiation: Secondary | ICD-10-CM | POA: Diagnosis not present

## 2015-05-16 DIAGNOSIS — C44729 Squamous cell carcinoma of skin of left lower limb, including hip: Secondary | ICD-10-CM | POA: Diagnosis not present

## 2015-05-20 DIAGNOSIS — H04123 Dry eye syndrome of bilateral lacrimal glands: Secondary | ICD-10-CM | POA: Diagnosis not present

## 2015-05-20 DIAGNOSIS — H40013 Open angle with borderline findings, low risk, bilateral: Secondary | ICD-10-CM | POA: Diagnosis not present

## 2015-05-20 DIAGNOSIS — H1132 Conjunctival hemorrhage, left eye: Secondary | ICD-10-CM | POA: Diagnosis not present

## 2015-05-21 ENCOUNTER — Ambulatory Visit (INDEPENDENT_AMBULATORY_CARE_PROVIDER_SITE_OTHER): Payer: Medicare Other

## 2015-05-21 DIAGNOSIS — I359 Nonrheumatic aortic valve disorder, unspecified: Secondary | ICD-10-CM

## 2015-05-21 DIAGNOSIS — Z7901 Long term (current) use of anticoagulants: Secondary | ICD-10-CM | POA: Diagnosis not present

## 2015-05-21 DIAGNOSIS — Z5181 Encounter for therapeutic drug level monitoring: Secondary | ICD-10-CM

## 2015-05-21 DIAGNOSIS — Z952 Presence of prosthetic heart valve: Secondary | ICD-10-CM

## 2015-05-21 DIAGNOSIS — Z954 Presence of other heart-valve replacement: Secondary | ICD-10-CM | POA: Diagnosis not present

## 2015-05-21 LAB — POCT INR: INR: 3.2

## 2015-06-24 ENCOUNTER — Other Ambulatory Visit: Payer: Self-pay | Admitting: Cardiology

## 2015-06-25 ENCOUNTER — Ambulatory Visit (INDEPENDENT_AMBULATORY_CARE_PROVIDER_SITE_OTHER): Payer: Medicare Other

## 2015-06-25 DIAGNOSIS — Z7901 Long term (current) use of anticoagulants: Secondary | ICD-10-CM | POA: Diagnosis not present

## 2015-06-25 DIAGNOSIS — Z85828 Personal history of other malignant neoplasm of skin: Secondary | ICD-10-CM | POA: Diagnosis not present

## 2015-06-25 DIAGNOSIS — Z954 Presence of other heart-valve replacement: Secondary | ICD-10-CM

## 2015-06-25 DIAGNOSIS — Z5181 Encounter for therapeutic drug level monitoring: Secondary | ICD-10-CM | POA: Diagnosis not present

## 2015-06-25 DIAGNOSIS — I359 Nonrheumatic aortic valve disorder, unspecified: Secondary | ICD-10-CM

## 2015-06-25 DIAGNOSIS — Z952 Presence of prosthetic heart valve: Secondary | ICD-10-CM

## 2015-06-25 DIAGNOSIS — L578 Other skin changes due to chronic exposure to nonionizing radiation: Secondary | ICD-10-CM | POA: Diagnosis not present

## 2015-06-25 DIAGNOSIS — L821 Other seborrheic keratosis: Secondary | ICD-10-CM | POA: Diagnosis not present

## 2015-06-25 LAB — POCT INR: INR: 3.8

## 2015-07-09 DIAGNOSIS — R300 Dysuria: Secondary | ICD-10-CM | POA: Diagnosis not present

## 2015-07-09 DIAGNOSIS — Z79899 Other long term (current) drug therapy: Secondary | ICD-10-CM | POA: Diagnosis not present

## 2015-07-09 DIAGNOSIS — Z23 Encounter for immunization: Secondary | ICD-10-CM | POA: Diagnosis not present

## 2015-07-09 DIAGNOSIS — I272 Other secondary pulmonary hypertension: Secondary | ICD-10-CM | POA: Diagnosis not present

## 2015-07-09 DIAGNOSIS — R609 Edema, unspecified: Secondary | ICD-10-CM | POA: Diagnosis not present

## 2015-07-30 ENCOUNTER — Ambulatory Visit (INDEPENDENT_AMBULATORY_CARE_PROVIDER_SITE_OTHER): Payer: Medicare Other | Admitting: *Deleted

## 2015-07-30 DIAGNOSIS — Z7901 Long term (current) use of anticoagulants: Secondary | ICD-10-CM | POA: Diagnosis not present

## 2015-07-30 DIAGNOSIS — Z5181 Encounter for therapeutic drug level monitoring: Secondary | ICD-10-CM

## 2015-07-30 DIAGNOSIS — I359 Nonrheumatic aortic valve disorder, unspecified: Secondary | ICD-10-CM | POA: Diagnosis not present

## 2015-07-30 DIAGNOSIS — Z954 Presence of other heart-valve replacement: Secondary | ICD-10-CM | POA: Diagnosis not present

## 2015-07-30 DIAGNOSIS — Z952 Presence of prosthetic heart valve: Secondary | ICD-10-CM

## 2015-07-30 LAB — POCT INR: INR: 3.5

## 2015-09-10 ENCOUNTER — Ambulatory Visit (INDEPENDENT_AMBULATORY_CARE_PROVIDER_SITE_OTHER): Payer: Medicare Other

## 2015-09-10 DIAGNOSIS — Z952 Presence of prosthetic heart valve: Secondary | ICD-10-CM

## 2015-09-10 DIAGNOSIS — Z7901 Long term (current) use of anticoagulants: Secondary | ICD-10-CM

## 2015-09-10 DIAGNOSIS — Z954 Presence of other heart-valve replacement: Secondary | ICD-10-CM | POA: Diagnosis not present

## 2015-09-10 DIAGNOSIS — Z5181 Encounter for therapeutic drug level monitoring: Secondary | ICD-10-CM

## 2015-09-10 DIAGNOSIS — I359 Nonrheumatic aortic valve disorder, unspecified: Secondary | ICD-10-CM | POA: Diagnosis not present

## 2015-09-10 LAB — POCT INR: INR: 3

## 2015-09-18 ENCOUNTER — Encounter: Payer: Self-pay | Admitting: Cardiology

## 2015-09-18 ENCOUNTER — Ambulatory Visit (INDEPENDENT_AMBULATORY_CARE_PROVIDER_SITE_OTHER): Payer: Medicare Other | Admitting: Cardiology

## 2015-09-18 VITALS — BP 100/68 | HR 69 | Ht 65.5 in | Wt 148.3 lb

## 2015-09-18 DIAGNOSIS — Z954 Presence of other heart-valve replacement: Secondary | ICD-10-CM | POA: Diagnosis not present

## 2015-09-18 DIAGNOSIS — Z7901 Long term (current) use of anticoagulants: Secondary | ICD-10-CM

## 2015-09-18 DIAGNOSIS — I482 Chronic atrial fibrillation, unspecified: Secondary | ICD-10-CM

## 2015-09-18 DIAGNOSIS — Z952 Presence of prosthetic heart valve: Secondary | ICD-10-CM

## 2015-09-18 DIAGNOSIS — I38 Endocarditis, valve unspecified: Secondary | ICD-10-CM

## 2015-09-18 NOTE — Patient Instructions (Signed)
Continue your current therapy  I will see you in 6 months.   

## 2015-09-18 NOTE — Progress Notes (Signed)
HPI:  [name] Chung is seen today for followup of valvular heart disease. She underwent redo open heart surgery in December 2012 at [facility]. This included redo aortic valve replacement with a Medtronic freestyle aortic root conduit. She also underwent tricuspid valve annuloplasty and repair. The mitral valve replacement was unchanged. She had initial surgery for rheumatic heart disease in 1982 with both aortic and mitral valve replacements with Bjork Shiley valves. She had repeat surgery in 1995 with a homograft aortic valve and a St. Jude mechanical mitral valve replacement. She is on chronic Coumadin. On followup today she is doing well. She denies any palpitations. She denies any chest pain or SOB.  She still has some edema in her ankles but this has improved with the addition of lasix.   She is active and just got a new treadmill for Christmas. She is able to walk 3 mi/hr on this.   Allergies  Allergen Reactions  . Asa Buff (Mag [Buffered Aspirin] Other (See Comments)    On coumadin  . Codeine   . Mirtazapine Nausea Only  . Ultram [Tramadol Hcl]     Current Outpatient Prescriptions on File Prior to Visit  Medication Sig Dispense Refill  . butalbital-acetaminophen-caffeine (FIORICET, ESGIC) 50-325-40 MG per tablet 1 tablet 2 (two) times daily as needed.     . digoxin (LANOXIN) 0.125 MG tablet Take 1 tablet (0.125 mg total) by mouth daily. 90 tablet 3  . furosemide (LASIX) 20 MG tablet Take 1 tablet (20 mg total) by mouth daily. 90 tablet 3  . hydrochlorothiazide (HYDRODIURIL) 50 MG tablet Take 1 tablet (50 mg total) by mouth daily. 90 tablet 3  . LORazepam (ATIVAN) 1 MG tablet Take 1 mg by mouth as needed.      . potassium chloride (K-DUR) 10 MEQ tablet Take 1 tablet (10 mEq total) by mouth 2 (two) times daily. 180 tablet 3  . temazepam (RESTORIL) 30 MG capsule Take 30 mg by mouth daily.     Marland Kitchen warfarin (COUMADIN) 10 MG tablet TAKE AS DIRECTED BY ANTICOAGULATION CLINIC 120 tablet 1    No current facility-administered medications on file prior to visit.    Past Medical History  Diagnosis Date  . Valvular heart disease   . Chronic atrial fibrillation ([place])   . Chronic anticoagulation   . Rheumatic heart disease   . Aortic insufficiency   . Arthritis   . Edema     Past Surgical History  Procedure Laterality Date  . Aortic valve replacement      1982, redo 1995 homograft, redo 2012 Medronic valve conduit-freestyle  . Mitral valve replacement      1982, redo 1995 St Jude  valve  . Back surgery    . Abdominal hysterectomy    . Cataract extraction w/ intraocular lens  implant, bilateral      Family History  Problem Relation Age of Onset  . Heart attack Mother   . Stroke Mother   . Diabetes Mother   . Other Father     [place] History   Social History  . Marital Status: Married    Spouse Name: N/A  . Number of Children: 0  . Years of Education: N/A   Occupational History  . 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 Smoking status: Never Smoker   . Smokeless tobacco: Not on file  . Alcohol Use: No  . Drug Use: No  .  Sexual Activity: Not on file   Other Topics Concern  . Not on file   Social History Narrative    ROS  As noted in history of present illness. All other systems were reviewed and are negative.  PHYSICAL EXAM BP 100/68 mmHg  Pulse 69  Ht 5' 5.5" (1.664 m)  Wt 67.268 kg (148 lb 4.8 oz)  BMI 24.29 kg/m2 She is an elderly female in no acute distress. HEENT is unremarkable. Neck is supple no JVD, adenopathy, thyromegaly, or bruits. Carotid upstrokes are good. Lungs are clear. Cardiac exam reveals a good mechanical mitral valve click. There is a grade A999333 systolic murmur heard best at the left sternal border. This radiates into the carotids. There is no diastolic murmur. PMI is normal. There is no S3.Pulse reveals occasional extrasystole. Abdomen is soft and nontender without masses or bruits.  Femoral and pedal pulses are 2+ and symmetric. She has tr edema. Skin is warm and dry. She is alert and oriented x3. Cranial nerves II through XII are intact.  Laboratory data:  Echocardiogram performed 01/15 at [facility] showed normal LV function. The right ventricle is moderately dilated. The prosthetic aortic valves was functioning normally. The mechanical mitral valve prosthesis was functioning normally.  INR last week was 3.0   ASSESSMENT AND PLAN  1. Valvular heart disease status post redo aortic valve replacement x3 and redo mitral valve surgery x2. Valve function is good by exam. Continue on coumadin. SBE prophylaxis.   2. Chronic anticoagulation. Continue coumadin. Therapeutic.  3. Edema. On HCTZ and lasix with good response.  Continue sodium restriction.  4. Permanent atrial fibrillation.   Dig level 0.4 and BMET normal last visit.  She is asymptomatic.    Will follow up in 6 months.

## 2015-10-22 ENCOUNTER — Ambulatory Visit (INDEPENDENT_AMBULATORY_CARE_PROVIDER_SITE_OTHER): Payer: Medicare Other | Admitting: Pharmacist

## 2015-10-22 DIAGNOSIS — Z954 Presence of other heart-valve replacement: Secondary | ICD-10-CM | POA: Diagnosis not present

## 2015-10-22 DIAGNOSIS — Z7901 Long term (current) use of anticoagulants: Secondary | ICD-10-CM | POA: Diagnosis not present

## 2015-10-22 DIAGNOSIS — Z952 Presence of prosthetic heart valve: Secondary | ICD-10-CM

## 2015-10-22 DIAGNOSIS — L570 Actinic keratosis: Secondary | ICD-10-CM | POA: Diagnosis not present

## 2015-10-22 DIAGNOSIS — D0471 Carcinoma in situ of skin of right lower limb, including hip: Secondary | ICD-10-CM | POA: Diagnosis not present

## 2015-10-22 DIAGNOSIS — L859 Epidermal thickening, unspecified: Secondary | ICD-10-CM | POA: Diagnosis not present

## 2015-10-22 DIAGNOSIS — I359 Nonrheumatic aortic valve disorder, unspecified: Secondary | ICD-10-CM | POA: Diagnosis not present

## 2015-10-22 DIAGNOSIS — D485 Neoplasm of uncertain behavior of skin: Secondary | ICD-10-CM | POA: Diagnosis not present

## 2015-10-22 DIAGNOSIS — Z5181 Encounter for therapeutic drug level monitoring: Secondary | ICD-10-CM | POA: Diagnosis not present

## 2015-10-22 DIAGNOSIS — L03011 Cellulitis of right finger: Secondary | ICD-10-CM | POA: Diagnosis not present

## 2015-10-22 LAB — POCT INR: INR: 3.1

## 2015-10-25 ENCOUNTER — Other Ambulatory Visit: Payer: Self-pay | Admitting: Cardiology

## 2015-12-03 ENCOUNTER — Ambulatory Visit (INDEPENDENT_AMBULATORY_CARE_PROVIDER_SITE_OTHER): Payer: Medicare Other | Admitting: *Deleted

## 2015-12-03 DIAGNOSIS — Z7901 Long term (current) use of anticoagulants: Secondary | ICD-10-CM | POA: Diagnosis not present

## 2015-12-03 DIAGNOSIS — Z954 Presence of other heart-valve replacement: Secondary | ICD-10-CM

## 2015-12-03 DIAGNOSIS — Z5181 Encounter for therapeutic drug level monitoring: Secondary | ICD-10-CM

## 2015-12-03 DIAGNOSIS — I359 Nonrheumatic aortic valve disorder, unspecified: Secondary | ICD-10-CM

## 2015-12-03 DIAGNOSIS — Z952 Presence of prosthetic heart valve: Secondary | ICD-10-CM

## 2015-12-03 LAB — POCT INR: INR: 2.7

## 2015-12-31 DIAGNOSIS — H04123 Dry eye syndrome of bilateral lacrimal glands: Secondary | ICD-10-CM | POA: Diagnosis not present

## 2015-12-31 DIAGNOSIS — H524 Presbyopia: Secondary | ICD-10-CM | POA: Diagnosis not present

## 2016-01-06 ENCOUNTER — Other Ambulatory Visit: Payer: Self-pay | Admitting: Cardiology

## 2016-01-08 ENCOUNTER — Ambulatory Visit
Admission: RE | Admit: 2016-01-08 | Discharge: 2016-01-08 | Disposition: A | Discharge status: Home / Self Care | Payer: Medicare Other | Source: Ambulatory Visit | Attending: Geriatric Medicine | Admitting: Geriatric Medicine

## 2016-01-08 ENCOUNTER — Other Ambulatory Visit: Payer: Self-pay | Admitting: Geriatric Medicine

## 2016-01-08 DIAGNOSIS — M8588 Other specified disorders of bone density and structure, other site: Secondary | ICD-10-CM | POA: Diagnosis not present

## 2016-01-08 DIAGNOSIS — I272 Other secondary pulmonary hypertension: Secondary | ICD-10-CM | POA: Diagnosis not present

## 2016-01-08 DIAGNOSIS — M542 Cervicalgia: Secondary | ICD-10-CM | POA: Diagnosis not present

## 2016-01-08 DIAGNOSIS — Z0000 Encounter for general adult medical examination without abnormal findings: Secondary | ICD-10-CM | POA: Diagnosis not present

## 2016-01-08 DIAGNOSIS — Z79899 Other long term (current) drug therapy: Secondary | ICD-10-CM | POA: Diagnosis not present

## 2016-01-08 DIAGNOSIS — Z1389 Encounter for screening for other disorder: Secondary | ICD-10-CM | POA: Diagnosis not present

## 2016-01-08 DIAGNOSIS — E7800 Pure hypercholesterolemia, unspecified: Secondary | ICD-10-CM | POA: Diagnosis not present

## 2016-01-08 DIAGNOSIS — I480 Paroxysmal atrial fibrillation: Secondary | ICD-10-CM | POA: Diagnosis not present

## 2016-01-08 DIAGNOSIS — M47812 Spondylosis without myelopathy or radiculopathy, cervical region: Secondary | ICD-10-CM | POA: Diagnosis not present

## 2016-01-14 ENCOUNTER — Ambulatory Visit (INDEPENDENT_AMBULATORY_CARE_PROVIDER_SITE_OTHER): Payer: Medicare Other | Admitting: *Deleted

## 2016-01-14 DIAGNOSIS — Z5181 Encounter for therapeutic drug level monitoring: Secondary | ICD-10-CM | POA: Diagnosis not present

## 2016-01-14 DIAGNOSIS — Z954 Presence of other heart-valve replacement: Secondary | ICD-10-CM | POA: Diagnosis not present

## 2016-01-14 DIAGNOSIS — Z7901 Long term (current) use of anticoagulants: Secondary | ICD-10-CM | POA: Diagnosis not present

## 2016-01-14 DIAGNOSIS — I359 Nonrheumatic aortic valve disorder, unspecified: Secondary | ICD-10-CM

## 2016-01-14 DIAGNOSIS — Z952 Presence of prosthetic heart valve: Secondary | ICD-10-CM

## 2016-01-14 LAB — POCT INR: INR: 3.3

## 2016-01-15 DIAGNOSIS — M542 Cervicalgia: Secondary | ICD-10-CM | POA: Diagnosis not present

## 2016-01-15 DIAGNOSIS — R293 Abnormal posture: Secondary | ICD-10-CM | POA: Diagnosis not present

## 2016-01-21 DIAGNOSIS — R293 Abnormal posture: Secondary | ICD-10-CM | POA: Diagnosis not present

## 2016-01-21 DIAGNOSIS — M542 Cervicalgia: Secondary | ICD-10-CM | POA: Diagnosis not present

## 2016-01-22 DIAGNOSIS — R293 Abnormal posture: Secondary | ICD-10-CM | POA: Diagnosis not present

## 2016-01-22 DIAGNOSIS — M542 Cervicalgia: Secondary | ICD-10-CM | POA: Diagnosis not present

## 2016-01-27 DIAGNOSIS — M542 Cervicalgia: Secondary | ICD-10-CM | POA: Diagnosis not present

## 2016-01-27 DIAGNOSIS — R293 Abnormal posture: Secondary | ICD-10-CM | POA: Diagnosis not present

## 2016-01-29 DIAGNOSIS — R293 Abnormal posture: Secondary | ICD-10-CM | POA: Diagnosis not present

## 2016-01-29 DIAGNOSIS — M542 Cervicalgia: Secondary | ICD-10-CM | POA: Diagnosis not present

## 2016-01-31 DIAGNOSIS — R293 Abnormal posture: Secondary | ICD-10-CM | POA: Diagnosis not present

## 2016-01-31 DIAGNOSIS — M542 Cervicalgia: Secondary | ICD-10-CM | POA: Diagnosis not present

## 2016-02-19 DIAGNOSIS — M8589 Other specified disorders of bone density and structure, multiple sites: Secondary | ICD-10-CM | POA: Diagnosis not present

## 2016-02-19 DIAGNOSIS — M859 Disorder of bone density and structure, unspecified: Secondary | ICD-10-CM | POA: Diagnosis not present

## 2016-02-25 ENCOUNTER — Ambulatory Visit (INDEPENDENT_AMBULATORY_CARE_PROVIDER_SITE_OTHER): Payer: Medicare Other | Admitting: Pharmacist

## 2016-02-25 DIAGNOSIS — Z7901 Long term (current) use of anticoagulants: Secondary | ICD-10-CM | POA: Diagnosis not present

## 2016-02-25 DIAGNOSIS — Z5181 Encounter for therapeutic drug level monitoring: Secondary | ICD-10-CM

## 2016-02-25 DIAGNOSIS — Z954 Presence of other heart-valve replacement: Secondary | ICD-10-CM

## 2016-02-25 DIAGNOSIS — M859 Disorder of bone density and structure, unspecified: Secondary | ICD-10-CM | POA: Diagnosis not present

## 2016-02-25 DIAGNOSIS — I359 Nonrheumatic aortic valve disorder, unspecified: Secondary | ICD-10-CM

## 2016-02-25 DIAGNOSIS — Z952 Presence of prosthetic heart valve: Secondary | ICD-10-CM

## 2016-02-25 LAB — POCT INR: INR: 3.2

## 2016-03-18 ENCOUNTER — Ambulatory Visit (INDEPENDENT_AMBULATORY_CARE_PROVIDER_SITE_OTHER): Payer: Medicare Other | Admitting: Cardiology

## 2016-03-18 ENCOUNTER — Encounter: Payer: Self-pay | Admitting: Cardiology

## 2016-03-18 VITALS — BP 130/50 | HR 74 | Ht 65.0 in | Wt 141.0 lb

## 2016-03-18 DIAGNOSIS — I38 Endocarditis, valve unspecified: Secondary | ICD-10-CM | POA: Diagnosis not present

## 2016-03-18 DIAGNOSIS — Z954 Presence of other heart-valve replacement: Secondary | ICD-10-CM

## 2016-03-18 DIAGNOSIS — Z7901 Long term (current) use of anticoagulants: Secondary | ICD-10-CM | POA: Diagnosis not present

## 2016-03-18 DIAGNOSIS — I482 Chronic atrial fibrillation, unspecified: Secondary | ICD-10-CM

## 2016-03-18 DIAGNOSIS — Z952 Presence of prosthetic heart valve: Secondary | ICD-10-CM

## 2016-03-18 NOTE — Progress Notes (Signed)
HPI:    [name] Chung is seen today for followup of valvular heart disease. She underwent redo open heart surgery in December 2012 at [facility]. This included redo aortic valve replacement with a Medtronic freestyle aortic root conduit. She also underwent tricuspid valve annuloplasty and repair. The mitral valve replacement was unchanged. She had initial surgery for rheumatic heart disease in 1982 with both aortic and mitral valve replacements with Bjork Shiley valves. She had repeat surgery in 1995 with a homograft aortic valve and a St. Jude mechanical mitral valve replacement. She is on chronic Coumadin.  On followup today she is doing well. She denies any palpitations. She denies any chest pain or SOB.  She still has some edema in her ankles that is worse at the end of the day.   She is active caring for her husband who has dementia. She walks on her treadmill daily. Reports labs done 2 months ago with Dr. Felipa Eth were normal.   Allergies  Allergen Reactions  . Asa Buff (Mag [Buffered Aspirin] Other (See Comments)    On coumadin  . Codeine   . Mirtazapine Nausea Only  . Ultram [Tramadol Hcl]     Current Outpatient Prescriptions on File Prior to Visit  Medication Sig Dispense Refill  . butalbital-acetaminophen-caffeine (FIORICET, ESGIC) 50-325-40 MG per tablet 1 tablet 2 (two) times daily as needed.     . digoxin (LANOXIN) 0.125 MG tablet TAKE 1 TABLET EVERY DAY 90 tablet 3  . furosemide (LASIX) 20 MG tablet TAKE 1 TABLET EVERY DAY 90 tablet 3  . hydrochlorothiazide (HYDRODIURIL) 50 MG tablet TAKE 1 TABLET EVERY DAY 90 tablet 3  . LORazepam (ATIVAN) 1 MG tablet Take 1 mg by mouth as needed.      . potassium chloride (K-DUR) 10 MEQ tablet Take 1 tablet (10 mEq total) by mouth 2 (two) times daily. 180 tablet 3  . temazepam (RESTORIL) 30 MG capsule Take 30 mg by mouth daily.     Marland Kitchen warfarin (COUMADIN) 10 MG tablet Take 1-1.5 tablets by mouth daily as directed by coumadin clinic 120  tablet 1   No current facility-administered medications on file prior to visit.    Past Medical History  Diagnosis Date  . Valvular heart disease   . Chronic atrial fibrillation ([place])   . Chronic anticoagulation   . Rheumatic heart disease   . Aortic insufficiency   . Arthritis   . Edema     Past Surgical History  Procedure Laterality Date  . Aortic valve replacement      1982, redo 1995 homograft, redo 2012 Medronic valve conduit-freestyle  . Mitral valve replacement      1982, redo 1995 St Jude  valve  . Back surgery    . Abdominal hysterectomy    . Cataract extraction w/ intraocular lens  implant, bilateral      Family History  Problem Relation Age of Onset  . Heart attack Mother   . Stroke Mother   . Diabetes Mother   . Other Father     [place] History   Social History  . Marital Status: Married    Spouse Name: N/A  . Number of Children: 0  . Years of Education: N/A   Occupational History  . 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 Smoking status: Never Smoker   . Smokeless tobacco: Not on file  . Alcohol Use: No  . Drug Use:  No  . Sexual Activity: Not on file   Other Topics Concern  . Not on file   Social History Narrative    ROS  As noted in history of present illness. All other systems were reviewed and are negative.  PHYSICAL EXAM BP 130/50 mmHg  Pulse 74  Ht 5\' 5"  (1.651 m)  Wt 141 lb (63.957 kg)  BMI 23.46 kg/m2 She is an elderly female in no acute distress. HEENT is unremarkable. Neck is supple no JVD, adenopathy, thyromegaly, or bruits. Carotid upstrokes are good. Lungs are clear. Cardiac exam reveals a good mechanical mitral valve click. There is a grade A999333 systolic murmur heard best at the left sternal border. This radiates into the carotids. There is no diastolic murmur. PMI is normal. There is no S3.Pulse reveals occasional extrasystole. Abdomen is soft and nontender without masses or bruits.  Femoral and pedal pulses are 2+ and symmetric. She has tr edema. Skin is warm and dry. She is alert and oriented x3. Cranial nerves II through XII are intact.  Laboratory data:  Echocardiogram performed 01/15 at [facility] showed normal LV function. The right ventricle is moderately dilated. The prosthetic aortic valves was functioning normally. The mechanical mitral valve prosthesis was functioning normally.  INR last was 3.2  Ecg today shows NSR (small p waves in V1-2) with PACs. Nonspecific ST-T changes c/w dig effect. Since last tracing accelerated junctional rhythm resolved. I have personally reviewed and interpreted this study.    ASSESSMENT AND PLAN  1. Valvular heart disease status post redo aortic valve replacement x3 and redo mitral valve surgery x2. Valve function is good by exam. Continue on coumadin. SBE prophylaxis.   2. Chronic anticoagulation. Continue coumadin. Therapeutic.  3. Edema. On HCTZ and lasix with good response. Weight is stable. Minimal edema on exam.  Continue sodium restriction.  4. Atrial fibrillation. ? If she is in NSR today with PACs. Continue dig and coumadin.   Will follow up in 6 months.

## 2016-03-18 NOTE — Patient Instructions (Signed)
Continue your current therapy  I will see you in 6 months.   

## 2016-03-31 ENCOUNTER — Ambulatory Visit (INDEPENDENT_AMBULATORY_CARE_PROVIDER_SITE_OTHER): Payer: Medicare Other | Admitting: *Deleted

## 2016-03-31 DIAGNOSIS — Z5181 Encounter for therapeutic drug level monitoring: Secondary | ICD-10-CM

## 2016-03-31 DIAGNOSIS — Z954 Presence of other heart-valve replacement: Secondary | ICD-10-CM | POA: Diagnosis not present

## 2016-03-31 DIAGNOSIS — Z7901 Long term (current) use of anticoagulants: Secondary | ICD-10-CM

## 2016-03-31 DIAGNOSIS — I359 Nonrheumatic aortic valve disorder, unspecified: Secondary | ICD-10-CM | POA: Diagnosis not present

## 2016-03-31 DIAGNOSIS — Z952 Presence of prosthetic heart valve: Secondary | ICD-10-CM

## 2016-03-31 LAB — POCT INR: INR: 5.1

## 2016-04-03 ENCOUNTER — Other Ambulatory Visit: Payer: Self-pay | Admitting: Cardiology

## 2016-04-09 ENCOUNTER — Ambulatory Visit (INDEPENDENT_AMBULATORY_CARE_PROVIDER_SITE_OTHER): Payer: Medicare Other | Admitting: *Deleted

## 2016-04-09 DIAGNOSIS — Z954 Presence of other heart-valve replacement: Secondary | ICD-10-CM | POA: Diagnosis not present

## 2016-04-09 DIAGNOSIS — I359 Nonrheumatic aortic valve disorder, unspecified: Secondary | ICD-10-CM | POA: Diagnosis not present

## 2016-04-09 DIAGNOSIS — Z7901 Long term (current) use of anticoagulants: Secondary | ICD-10-CM

## 2016-04-09 DIAGNOSIS — Z5181 Encounter for therapeutic drug level monitoring: Secondary | ICD-10-CM | POA: Diagnosis not present

## 2016-04-09 DIAGNOSIS — Z952 Presence of prosthetic heart valve: Secondary | ICD-10-CM

## 2016-04-09 LAB — POCT INR: INR: 2.5

## 2016-05-12 ENCOUNTER — Ambulatory Visit (INDEPENDENT_AMBULATORY_CARE_PROVIDER_SITE_OTHER): Payer: Medicare Other | Admitting: *Deleted

## 2016-05-12 DIAGNOSIS — Z952 Presence of prosthetic heart valve: Secondary | ICD-10-CM

## 2016-05-12 DIAGNOSIS — I359 Nonrheumatic aortic valve disorder, unspecified: Secondary | ICD-10-CM

## 2016-05-12 DIAGNOSIS — Z5181 Encounter for therapeutic drug level monitoring: Secondary | ICD-10-CM | POA: Diagnosis not present

## 2016-05-12 DIAGNOSIS — Z954 Presence of other heart-valve replacement: Secondary | ICD-10-CM | POA: Diagnosis not present

## 2016-05-12 DIAGNOSIS — Z7901 Long term (current) use of anticoagulants: Secondary | ICD-10-CM

## 2016-05-12 LAB — POCT INR: INR: 3.3

## 2016-06-23 ENCOUNTER — Ambulatory Visit (INDEPENDENT_AMBULATORY_CARE_PROVIDER_SITE_OTHER): Payer: Medicare Other | Admitting: *Deleted

## 2016-06-23 DIAGNOSIS — Z954 Presence of other heart-valve replacement: Secondary | ICD-10-CM

## 2016-06-23 DIAGNOSIS — Z5181 Encounter for therapeutic drug level monitoring: Secondary | ICD-10-CM | POA: Diagnosis not present

## 2016-06-23 DIAGNOSIS — Z7901 Long term (current) use of anticoagulants: Secondary | ICD-10-CM

## 2016-06-23 DIAGNOSIS — I359 Nonrheumatic aortic valve disorder, unspecified: Secondary | ICD-10-CM

## 2016-06-23 DIAGNOSIS — Z952 Presence of prosthetic heart valve: Secondary | ICD-10-CM

## 2016-06-23 LAB — POCT INR: INR: 3.5

## 2016-07-07 DIAGNOSIS — S80811A Abrasion, right lower leg, initial encounter: Secondary | ICD-10-CM | POA: Diagnosis not present

## 2016-07-07 DIAGNOSIS — Z79899 Other long term (current) drug therapy: Secondary | ICD-10-CM | POA: Diagnosis not present

## 2016-07-07 DIAGNOSIS — M8580 Other specified disorders of bone density and structure, unspecified site: Secondary | ICD-10-CM | POA: Diagnosis not present

## 2016-07-07 DIAGNOSIS — I480 Paroxysmal atrial fibrillation: Secondary | ICD-10-CM | POA: Diagnosis not present

## 2016-07-07 DIAGNOSIS — Z23 Encounter for immunization: Secondary | ICD-10-CM | POA: Diagnosis not present

## 2016-07-07 DIAGNOSIS — I279 Pulmonary heart disease, unspecified: Secondary | ICD-10-CM | POA: Diagnosis not present

## 2016-07-27 DIAGNOSIS — H04123 Dry eye syndrome of bilateral lacrimal glands: Secondary | ICD-10-CM | POA: Diagnosis not present

## 2016-07-27 DIAGNOSIS — H5213 Myopia, bilateral: Secondary | ICD-10-CM | POA: Diagnosis not present

## 2016-08-04 ENCOUNTER — Encounter (INDEPENDENT_AMBULATORY_CARE_PROVIDER_SITE_OTHER): Payer: Self-pay

## 2016-08-04 ENCOUNTER — Ambulatory Visit (INDEPENDENT_AMBULATORY_CARE_PROVIDER_SITE_OTHER): Payer: Medicare Other | Admitting: Pharmacist

## 2016-08-04 DIAGNOSIS — I359 Nonrheumatic aortic valve disorder, unspecified: Secondary | ICD-10-CM | POA: Diagnosis not present

## 2016-08-04 DIAGNOSIS — Z952 Presence of prosthetic heart valve: Secondary | ICD-10-CM | POA: Diagnosis not present

## 2016-08-04 DIAGNOSIS — Z5181 Encounter for therapeutic drug level monitoring: Secondary | ICD-10-CM

## 2016-08-04 DIAGNOSIS — Z7901 Long term (current) use of anticoagulants: Secondary | ICD-10-CM

## 2016-08-04 LAB — POCT INR: INR: 3.3

## 2016-09-02 DIAGNOSIS — H04123 Dry eye syndrome of bilateral lacrimal glands: Secondary | ICD-10-CM | POA: Diagnosis not present

## 2016-09-02 DIAGNOSIS — H40053 Ocular hypertension, bilateral: Secondary | ICD-10-CM | POA: Diagnosis not present

## 2016-09-08 ENCOUNTER — Other Ambulatory Visit: Payer: Self-pay | Admitting: Cardiology

## 2016-09-10 ENCOUNTER — Encounter: Payer: Self-pay | Admitting: Cardiology

## 2016-09-12 NOTE — Progress Notes (Signed)
Cardiology Office Note    Date:  09/15/2016   ID:  [name] Chung, DOB 11-Aug-1933, MRN [id number]  PCP:  Mathews Argyle, MD  Cardiologist:  Breanda Greenlaw Martinique, MD    History of Present Illness:  [name] Chung is a 82 y.o. female with a history of Rheumatic heart disease. She underwent redo open heart surgery in December 2012 at [facility]. This included redo aortic valve replacement with a Medtronic freestyle aortic root conduit. She also underwent tricuspid valve annuloplasty and repair. The mitral valve replacement was unchanged. She had initial surgery for rheumatic heart disease in 1982 with both aortic and mitral valve replacements with Bjork Shiley valves. She had repeat surgery in 1995 with a homograft aortic valve and a St. Jude mechanical mitral valve replacement. She is on chronic Coumadin.  On follow up today she is doing well from a cardiac standpoint. No chest pain or dyspnea. No palpitations. She may have glaucoma and her eye doctor wants to know if Timolol drops are OK.     Past Medical History:  Diagnosis Date  . Aortic insufficiency   . Arthritis   . Chronic anticoagulation   . Chronic atrial fibrillation ([place])   . Edema   . Rheumatic heart disease   . Valvular heart disease     Past Surgical History:  Procedure Laterality Date  . ABDOMINAL HYSTERECTOMY    . AORTIC VALVE REPLACEMENT     1982, redo 1995 homograft, redo 2012 Medronic valve conduit-freestyle  . BACK SURGERY    . CATARACT EXTRACTION W/ INTRAOCULAR LENS  IMPLANT, BILATERAL    . MITRAL VALVE REPLACEMENT     1982, redo 1995 St Jude  valve    Current Medications: Outpatient Medications Prior to Visit  Medication Sig Dispense Refill  . butalbital-acetaminophen-caffeine (FIORICET, ESGIC) 50-325-40 MG per tablet 1 tablet 2 (two) times daily as needed.     . digoxin (LANOXIN) 0.125 MG tablet TAKE 1 TABLET EVERY DAY 90 tablet 3  . furosemide (LASIX) 20 MG tablet TAKE 1 TABLET EVERY DAY 90  tablet 3  . hydrochlorothiazide (HYDRODIURIL) 50 MG tablet TAKE 1 TABLET EVERY DAY 90 tablet 3  . LORazepam (ATIVAN) 1 MG tablet Take 1 mg by mouth as needed.      . potassium chloride (K-DUR) 10 MEQ tablet Take 1 tablet (10 mEq total) by mouth 2 (two) times daily. 180 tablet 3  . temazepam (RESTORIL) 30 MG capsule Take 30 mg by mouth daily.     Marland Kitchen warfarin (COUMADIN) 10 MG tablet TAKE AS DIRECTED BY ANTICOAGULATION CLINIC 120 tablet 0   No facility-administered medications prior to visit.      Allergies:   Asa buff (mag [buffered aspirin]; Codeine; Mirtazapine; and Ultram [tramadol hcl]   Social History   Social History  . Marital status: Married    Spouse name: N/A  . Number of children: 0  . Years of education: N/A   Occupational History  . 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 Smoking status: Never Smoker  . Smokeless tobacco: Never Used  . Alcohol use No  . Drug use: No  . Sexual activity: Not Asked   Other Topics Concern  . None   Social History Narrative  . None     Family History:  The patient's family history includes Diabetes in her mother; Heart attack in her mother; Other in her father; Stroke in her mother.   ROS:  Please see the history of present illness.    ROS All other systems reviewed and are negative.   PHYSICAL EXAM:   VS:  BP 130/64   Pulse 72   Ht 5' 5.5" (1.664 m)   Wt 139 lb 9.6 oz (63.3 kg)   SpO2 97%   BMI 22.88 kg/m    She is an elderly female in no acute distress. HEENT is unremarkable. Neck is supple no JVD, adenopathy, thyromegaly, or bruits. Carotid upstrokes are good. Lungs are clear. Cardiac exam reveals a good mechanical mitral valve click. There is a grade A999333 systolic murmur heard best at the left sternal border. This radiates into the carotids. There is no diastolic murmur. PMI is normal. There is no S3.Pulse reveals occasional extrasystole. Abdomen is soft and nontender without masses or bruits.  Femoral and pedal pulses are 2+ and symmetric. She has tr edema. Skin is warm and dry. She is alert and oriented x3. Cranial nerves II through XII are intact.  Wt Readings from Last 3 Encounters:  09/15/16 139 lb 9.6 oz (63.3 kg)  03/18/16 141 lb (64 kg)  09/18/15 148 lb 4.8 oz (67.3 kg)      Studies/Labs Reviewed:   EKG:  EKG is not ordered today.  The ekg ordered today demonstrates N/A  Recent Labs: No results found for requested labs within last 8760 hours.   Lipid Panel No results found for: CHOL, TRIG, HDL, CHOLHDL, VLDL, LDLCALC, LDLDIRECT  Additional studies/ records that were reviewed today include:  Labs dated 01/08/16: cholesterol 292, triglycerides 171, HDL 74, LDL 170. BMET and CBC normal.   ASSESSMENT:    1. S/P MVR (mitral valve replacement)   2. S/P AVR (aortic valve replacement)   3. Long term current use of anticoagulant therapy   4. Chronic atrial fibrillation (HCC)      PLAN:  In order of problems listed above:   1. Valvular heart disease status post redo aortic valve replacement x3 and redo mitral valve surgery x2. Valve function is good by exam. Continue on coumadin. SBE prophylaxis.   2. Chronic anticoagulation. Continue coumadin. Check INR today.  3.  Atrial fibrillation. Chronic. Rate controlled.  Continue dig and coumadin.   From my standpoint it is safe to use Timolol eye drops.   Will follow up in 6 months.    Medication Adjustments/Labs and Tests Ordered: Current medicines are reviewed at length with the patient today.  Concerns regarding medicines are outlined above.  Medication changes, Labs and Tests ordered today are listed in the Patient Instructions below. Patient Instructions  Continue your current therapy  I will see you in 6 months.    Signed, Leverne Amrhein Martinique, MD  09/15/2016 4:36 PM    [facility] [address], [place], [place], [address] [phone]

## 2016-09-14 ENCOUNTER — Telehealth: Payer: Self-pay | Admitting: Cardiology

## 2016-09-14 DIAGNOSIS — H40003 Preglaucoma, unspecified, bilateral: Secondary | ICD-10-CM | POA: Diagnosis not present

## 2016-09-14 NOTE — Telephone Encounter (Signed)
Returned call to patient no answer.LMTC. 

## 2016-09-14 NOTE — Telephone Encounter (Signed)
Patient requested to speak with Malachy Mood regarding her medications; specifically her Warfin.  Please call patient to advise.

## 2016-09-15 ENCOUNTER — Ambulatory Visit (INDEPENDENT_AMBULATORY_CARE_PROVIDER_SITE_OTHER): Payer: Medicare Other | Admitting: Pharmacist Clinician (PhC)/ Clinical Pharmacy Specialist

## 2016-09-15 ENCOUNTER — Encounter: Payer: Self-pay | Admitting: Cardiology

## 2016-09-15 ENCOUNTER — Ambulatory Visit (INDEPENDENT_AMBULATORY_CARE_PROVIDER_SITE_OTHER): Payer: Medicare Other | Admitting: Cardiology

## 2016-09-15 VITALS — BP 130/64 | HR 72 | Ht 65.5 in | Wt 139.6 lb

## 2016-09-15 DIAGNOSIS — Z7901 Long term (current) use of anticoagulants: Secondary | ICD-10-CM

## 2016-09-15 DIAGNOSIS — I359 Nonrheumatic aortic valve disorder, unspecified: Secondary | ICD-10-CM

## 2016-09-15 DIAGNOSIS — I482 Chronic atrial fibrillation, unspecified: Secondary | ICD-10-CM

## 2016-09-15 DIAGNOSIS — Z952 Presence of prosthetic heart valve: Secondary | ICD-10-CM

## 2016-09-15 DIAGNOSIS — Z5181 Encounter for therapeutic drug level monitoring: Secondary | ICD-10-CM

## 2016-09-15 LAB — POCT INR: INR: 3.7

## 2016-09-15 NOTE — Patient Instructions (Addendum)
Continue your current therapy  I will see you in 6 months.   

## 2016-09-18 NOTE — Telephone Encounter (Signed)
Patient discussed medications at office visit with Dr.Jordan 09/15/16.

## 2016-09-22 DIAGNOSIS — H40003 Preglaucoma, unspecified, bilateral: Secondary | ICD-10-CM | POA: Diagnosis not present

## 2016-09-30 DIAGNOSIS — H40003 Preglaucoma, unspecified, bilateral: Secondary | ICD-10-CM | POA: Diagnosis not present

## 2016-10-27 ENCOUNTER — Ambulatory Visit (INDEPENDENT_AMBULATORY_CARE_PROVIDER_SITE_OTHER): Payer: Medicare Other | Admitting: *Deleted

## 2016-10-27 DIAGNOSIS — Z952 Presence of prosthetic heart valve: Secondary | ICD-10-CM

## 2016-10-27 DIAGNOSIS — Z7901 Long term (current) use of anticoagulants: Secondary | ICD-10-CM | POA: Diagnosis not present

## 2016-10-27 DIAGNOSIS — I359 Nonrheumatic aortic valve disorder, unspecified: Secondary | ICD-10-CM | POA: Diagnosis not present

## 2016-10-27 DIAGNOSIS — Z5181 Encounter for therapeutic drug level monitoring: Secondary | ICD-10-CM

## 2016-10-27 LAB — POCT INR: INR: 3.8

## 2016-11-02 DIAGNOSIS — Z1231 Encounter for screening mammogram for malignant neoplasm of breast: Secondary | ICD-10-CM | POA: Diagnosis not present

## 2016-11-02 DIAGNOSIS — H40003 Preglaucoma, unspecified, bilateral: Secondary | ICD-10-CM | POA: Diagnosis not present

## 2016-11-03 ENCOUNTER — Other Ambulatory Visit: Payer: Self-pay | Admitting: Cardiology

## 2016-11-11 ENCOUNTER — Ambulatory Visit (INDEPENDENT_AMBULATORY_CARE_PROVIDER_SITE_OTHER): Payer: Medicare Other | Admitting: *Deleted

## 2016-11-11 DIAGNOSIS — Z7901 Long term (current) use of anticoagulants: Secondary | ICD-10-CM | POA: Diagnosis not present

## 2016-11-11 DIAGNOSIS — Z952 Presence of prosthetic heart valve: Secondary | ICD-10-CM | POA: Diagnosis not present

## 2016-11-11 DIAGNOSIS — I359 Nonrheumatic aortic valve disorder, unspecified: Secondary | ICD-10-CM

## 2016-11-11 DIAGNOSIS — Z5181 Encounter for therapeutic drug level monitoring: Secondary | ICD-10-CM

## 2016-11-11 DIAGNOSIS — H04123 Dry eye syndrome of bilateral lacrimal glands: Secondary | ICD-10-CM | POA: Diagnosis not present

## 2016-11-11 DIAGNOSIS — H40003 Preglaucoma, unspecified, bilateral: Secondary | ICD-10-CM | POA: Diagnosis not present

## 2016-11-11 LAB — POCT INR: INR: 4.8

## 2016-11-17 ENCOUNTER — Telehealth: Payer: Self-pay | Admitting: Cardiology

## 2016-11-17 NOTE — Telephone Encounter (Signed)
New Message     Pt called she is concerned about her coumadin level, she is being light headed

## 2016-11-17 NOTE — Telephone Encounter (Signed)
Pt wants to speak to [facility].

## 2016-11-17 NOTE — Telephone Encounter (Signed)
See other note from same day

## 2016-11-17 NOTE — Telephone Encounter (Signed)
Patient called, concerned about recent elevated INR readings.  Was concerned that no enough has been done to lower reading.  Assured patient that I agreed with changes made by RN at [facility] office, explained that if we decreased the dose too much too quickly, she would drop too low and we would then have numbers jump high/low while trying to find right dose.  Offered her earlier appt than next Tuesday if she was concerned about high reading.   Patient stated she was okay to wait until Tuesday, but as they live by [facility], could they please come to [facility] as it is closer and we are now available on Tuesdays.  I assured her that this would be fine, appointments for both patient and husband switched to [facility] office.

## 2016-11-17 NOTE — Telephone Encounter (Signed)
New Message     Wants Erasmo Downer to call her about her Moises Blood, only wants to speak with Erasmo Downer no one else

## 2016-11-24 ENCOUNTER — Ambulatory Visit (INDEPENDENT_AMBULATORY_CARE_PROVIDER_SITE_OTHER): Payer: Medicare Other | Admitting: Pharmacist

## 2016-11-24 DIAGNOSIS — I359 Nonrheumatic aortic valve disorder, unspecified: Secondary | ICD-10-CM | POA: Diagnosis not present

## 2016-11-24 DIAGNOSIS — Z5181 Encounter for therapeutic drug level monitoring: Secondary | ICD-10-CM

## 2016-11-24 DIAGNOSIS — Z7901 Long term (current) use of anticoagulants: Secondary | ICD-10-CM | POA: Diagnosis not present

## 2016-11-24 DIAGNOSIS — Z952 Presence of prosthetic heart valve: Secondary | ICD-10-CM

## 2016-11-24 LAB — PROTIME-INR
INR: 7 — ABNORMAL HIGH
Prothrombin Time: 68.5 s — ABNORMAL HIGH (ref 9.0–11.5)

## 2016-11-24 LAB — POCT INR: INR: 8

## 2016-12-08 ENCOUNTER — Ambulatory Visit (INDEPENDENT_AMBULATORY_CARE_PROVIDER_SITE_OTHER): Payer: Medicare Other | Admitting: Pharmacist Clinician (PhC)/ Clinical Pharmacy Specialist

## 2016-12-08 DIAGNOSIS — I359 Nonrheumatic aortic valve disorder, unspecified: Secondary | ICD-10-CM | POA: Diagnosis not present

## 2016-12-08 DIAGNOSIS — Z5181 Encounter for therapeutic drug level monitoring: Secondary | ICD-10-CM | POA: Diagnosis not present

## 2016-12-08 DIAGNOSIS — Z952 Presence of prosthetic heart valve: Secondary | ICD-10-CM | POA: Diagnosis not present

## 2016-12-08 DIAGNOSIS — Z7901 Long term (current) use of anticoagulants: Secondary | ICD-10-CM

## 2016-12-08 LAB — POCT INR: INR: 3.5

## 2016-12-14 ENCOUNTER — Other Ambulatory Visit: Payer: Self-pay | Admitting: Cardiology

## 2017-01-13 DIAGNOSIS — H8110 Benign paroxysmal vertigo, unspecified ear: Secondary | ICD-10-CM | POA: Diagnosis not present

## 2017-01-13 DIAGNOSIS — Z23 Encounter for immunization: Secondary | ICD-10-CM | POA: Diagnosis not present

## 2017-01-13 DIAGNOSIS — R740 Nonspecific elevation of levels of transaminase and lactic acid dehydrogenase [LDH]: Secondary | ICD-10-CM | POA: Diagnosis not present

## 2017-01-13 DIAGNOSIS — E7800 Pure hypercholesterolemia, unspecified: Secondary | ICD-10-CM | POA: Diagnosis not present

## 2017-01-13 DIAGNOSIS — Z0000 Encounter for general adult medical examination without abnormal findings: Secondary | ICD-10-CM | POA: Diagnosis not present

## 2017-01-13 DIAGNOSIS — I480 Paroxysmal atrial fibrillation: Secondary | ICD-10-CM | POA: Diagnosis not present

## 2017-01-13 DIAGNOSIS — I2721 Secondary pulmonary arterial hypertension: Secondary | ICD-10-CM | POA: Diagnosis not present

## 2017-01-13 DIAGNOSIS — M8580 Other specified disorders of bone density and structure, unspecified site: Secondary | ICD-10-CM | POA: Diagnosis not present

## 2017-01-13 DIAGNOSIS — Z1389 Encounter for screening for other disorder: Secondary | ICD-10-CM | POA: Diagnosis not present

## 2017-01-13 DIAGNOSIS — I099 Rheumatic heart disease, unspecified: Secondary | ICD-10-CM | POA: Diagnosis not present

## 2017-01-13 DIAGNOSIS — Z79899 Other long term (current) drug therapy: Secondary | ICD-10-CM | POA: Diagnosis not present

## 2017-01-19 ENCOUNTER — Ambulatory Visit (INDEPENDENT_AMBULATORY_CARE_PROVIDER_SITE_OTHER): Payer: Medicare Other | Admitting: Pharmacist Clinician (PhC)/ Clinical Pharmacy Specialist

## 2017-01-19 DIAGNOSIS — Z7901 Long term (current) use of anticoagulants: Secondary | ICD-10-CM | POA: Diagnosis not present

## 2017-01-19 DIAGNOSIS — I359 Nonrheumatic aortic valve disorder, unspecified: Secondary | ICD-10-CM | POA: Diagnosis not present

## 2017-01-19 DIAGNOSIS — Z952 Presence of prosthetic heart valve: Secondary | ICD-10-CM

## 2017-01-19 DIAGNOSIS — Z5181 Encounter for therapeutic drug level monitoring: Secondary | ICD-10-CM | POA: Diagnosis not present

## 2017-01-19 LAB — POCT INR: INR: 1.5

## 2017-01-22 ENCOUNTER — Ambulatory Visit: Payer: Medicare Other | Attending: Geriatric Medicine | Admitting: Rehabilitative and Restorative Service Providers"

## 2017-01-22 DIAGNOSIS — R42 Dizziness and giddiness: Secondary | ICD-10-CM | POA: Diagnosis not present

## 2017-01-22 DIAGNOSIS — R2689 Other abnormalities of gait and mobility: Secondary | ICD-10-CM | POA: Diagnosis not present

## 2017-01-22 DIAGNOSIS — R2681 Unsteadiness on feet: Secondary | ICD-10-CM

## 2017-01-22 NOTE — Patient Instructions (Addendum)
Gaze Stabilization - Tip Card  1.Target must remain in focus, not blurry, and appear stationary while head is in motion. 2.Perform exercises with small head movements (45 to either side of midline). 3.Increase speed of head motion so long as target is in focus. 4.If you wear eyeglasses, be sure you can see target through lens (therapist will give specific instructions for bifocal / progressive lenses). 5.These exercises may provoke dizziness or nausea. Work through these symptoms. If too dizzy, slow head movement slightly. Rest between each exercise. 6.Exercises demand concentration; avoid distractions. 7.For safety, perform standing exercises close to a counter, wall, corner, or next to someone.  Copyright  VHI. All rights reserved.   Gaze Stabilization - Standing Feet Apart   Feet shoulder width apart, keeping eyes on target on wall 3 feet away, tilt head down slightly and move head side to side for 30 seconds. Repeat while moving head up and down for 30 seconds. *Work up to tolerating 60 seconds, as able. Do 2-3 sessions per day.   Copyright  VHI. All rights reserved.   Feet Together, Head Motion - Eyes Open    With eyes open, feet together, move head slowly: side to side x 10 times *let eyes and head move together. Repeat __10__ times per session. Do _2___ sessions per day.  Copyright  VHI. All rights reserved.   Feet Together, Varied Arm Positions - Eyes Closed    Stand with feet together and arms out. Close eyes and visualize upright position. Hold __30__ seconds. Repeat _3___ times per session. Do __2__ sessions per day.  Copyright  VHI. All rights reserved.

## 2017-01-24 NOTE — Therapy (Signed)
[facility] [address] [place], [place], [address] Phone: [phone]   Fax:  [phone]  Physical Therapy Evaluation  Patient Details  Name: [name] Chung MRN: [id number] Date of Birth: [birth year] Referring Provider: Lajean Manes, MD  Encounter Date: 01/22/2017     01/22/17 1732  PT Visits / Re-Eval  Visit Number 1  Number of Visits 4  Date for PT Re-Evaluation 03/08/17  Authorization  Authorization Type G code every 10th visit  PT Time Calculation  PT Start Time 1500  PT Stop Time 1540  PT Time Calculation (min) 40 min  PT - End of Session  Activity Tolerance Patient tolerated treatment well  Behavior During Therapy [facility] for tasks assessed/performed     Past Medical History:  Diagnosis Date  . Aortic insufficiency   . Arthritis   . Chronic anticoagulation   . Chronic atrial fibrillation ([place])   . Edema   . Rheumatic heart disease   . Valvular heart disease     Past Surgical History:  Procedure Laterality Date  . ABDOMINAL HYSTERECTOMY    . AORTIC VALVE REPLACEMENT     1982, redo 1995 homograft, redo 2012 Medronic valve conduit-freestyle  . BACK SURGERY    . CATARACT EXTRACTION W/ INTRAOCULAR LENS  IMPLANT, BILATERAL    . MITRAL VALVE REPLACEMENT     1982, redo [place]  valve    There were no vitals filed for this visit.    01/22/17 1504  Symptoms/Limitations  Subjective The patient reports dizziness started 11/06/16 after a birthday lunch for her granddaughter (she notes lunch upset her stomach/ had virus).  The dizziness began after that--she wasn't sure if she had food poisoning or if this was related to vertigo.  She describes dizzy "spells" worse with bending over or turning her head quickly, or moving her body quickly.  The worst episode was after seeing an eye doctor and feeling that spinning occurred (she thinks this may have been related to eye procedure).    Pertinent History  rheumatic heart disease, aortic valve replacement, a-fib  Patient Stated Goals "I don't know - look at vertigo."  Pain Assessment  Currently in Pain? No/denies           [facility] PT Assessment - 01/24/17 0720      Assessment   Medical Diagnosis BPPV   Referring Provider Lajean Manes, MD   Onset Date/Surgical Date 11/06/16   Prior Therapy none     Restrictions   Weight Bearing Restrictions No     Balance Screen   Has the patient fallen in the past 6 months No   Has the patient had a decrease in activity level because of a fear of falling?  No   Is the patient reluctant to leave their home because of a fear of falling?  No     Home Ecologist residence   Living Arrangements Spouse/significant other  caregiver for husband     Prior Function   Level of Independence Independent   Leisure primary caregiver for husband     Cognition   Overall Cognitive Status Within Functional Limits for tasks assessed     Standardized Balance Assessment   Standardized Balance Assessment Berg Balance Test     Berg Balance Test   Sit to Stand Able to stand without using hands and stabilize independently   Standing Unsupported Able to stand safely 2 minutes   Sitting with Back Unsupported but Feet  Supported on Floor or Stool Able to sit safely and securely 2 minutes   Stand to Sit Sits safely with minimal use of hands   Transfers Able to transfer safely, minor use of hands   Standing Unsupported with Eyes Closed Able to stand 10 seconds safely   Standing Ubsupported with Feet Together Able to place feet together independently and stand 1 minute safely   From Standing, Reach Forward with Outstretched Arm Can reach confidently >25 cm (10")   From Standing Position, Pick up Object from Floor Able to pick up shoe safely and easily   From Standing Position, Turn to Look Behind Over each Shoulder Looks behind from both sides and weight shifts well   Turn 360 Degrees  Able to turn 360 degrees safely in 4 seconds or less  patient gets dizzy with turning in place   Standing Unsupported, Alternately Place Feet on Step/Stool Able to stand independently and safely and complete 8 steps in 20 seconds   Standing Unsupported, One Foot in Front Able to take small step independently and hold 30 seconds   Standing on One Leg Able to lift leg independently and hold equal to or more than 3 seconds   Total Score 52   Berg comment: 52/56 indicating lower risk for falls     High Level Balance   High Level Balance Comments Standing feet together with eyes closed = 10 seconds with increased sway, needs supervision.  STanding foam with eyes closed=sway - able to maintain 10 seconds with close supervision.              Vestibular Assessment - 01/24/17 0720      Vestibular Assessment   General Observation Patient walks indep into clinic.  She has not had dizziness today.  "It doesn't come on all of the time."     Symptom Behavior   Type of Dizziness Lightheadedness  "I feel like I'm falling to the side"   Frequency of Dizziness Most days occurs.  Can happen when sitting still or when scrolling on a computer.   Duration of Dizziness Seconds to minutes   Aggravating Factors Turning body quickly;Turning head quickly;Driving  "its not consistent"   Relieving Factors No known relieving factors     Occulomotor Exam   Occulomotor Alignment Normal   Spontaneous Absent   Gaze-induced Absent   Smooth Pursuits Intact   Comment Cateract surgery     Vestibulo-Occular Reflex   VOR 1 Head Only (x 1 viewing) slow gaze x 1 patient is able to maintain fixation   Comment Head impulse test=positive to the right side for refixation saccade.     Visual Acuity   Static 20/70 *both eyes open   Dynamic Unable to view eye chart with head motion.     Positional Testing   Dix-Hallpike Dix-Hallpike Right;Dix-Hallpike Left   Sidelying Test Sidelying Right;Sidelying Left   Horizontal  Canal Testing Horizontal Canal Right;Horizontal Canal Left     Dix-Hallpike Right   Dix-Hallpike Right Duration none   Dix-Hallpike Right Symptoms No nystagmus     Dix-Hallpike Left   Dix-Hallpike Left Duration none   Dix-Hallpike Left Symptoms No nystagmus     Sidelying Right   Sidelying Right Duration none   Sidelying Right Symptoms No nystagmus     Sidelying Left   Sidelying Left Duration none   Sidelying Left Symptoms No nystagmus     Horizontal Canal Right   Horizontal Canal Right Duration none   Horizontal Canal Right Symptoms  Normal     Horizontal Canal Left   Horizontal Canal Left Duration none   Horizontal Canal Left Symptoms Normal             01/22/17 1539  PT Education  Education provided Yes  Education Details HEP: vor x 1 viewing, corner standing with feet together + eyes closed, corner standing with head motion  Person(s) Educated Patient  Methods Explanation;Demonstration;Handout  Comprehension Returned demonstration;Verbalized understanding        [facility] Adult PT Treatment/Exercise - 01/24/17 6063      Neuro Re-ed    Neuro Re-ed Details  Eyes closed with feet together, head motion with feet together, VOR x 1 viewing horizontal and vertical plane.              PT Long Term Goals - 01/24/17 0160      PT LONG TERM GOAL #1   Title The patient will be indep with HEP for habituation, gaze x 1 viewing.    Baseline Target date 02/21/17   Time 4   Period Weeks     PT LONG TERM GOAL #2   Title The patient will improve on dizziness handicap inventory by 15% to demo reduced self perception of symptoms.   Baseline Target date 02/21/17   Time 4   Period Weeks     PT LONG TERM GOAL #3   Title The patient will demo single leg stance x 10 seconds right and left LE for improved balance control.   Baseline Target date 02/21/17   Time 4   Period Weeks     PT LONG TERM GOAL #4   Title The patient will demo standing x 30 seconds with eyes closed +  feet together.   Baseline Target date 02/21/17   Time 4   Period Weeks               Plan - 01/24/17 0725    Clinical Impression Statement The patient is an 83 year old female presenting to OP physical therapy with sudden onset of vertigo, also associated with stomach virus.  At today's visit she is negative for positional testing, however does demonstrate decreased use of vestibular ocular reflex (noted per positive R head impulse test and dec'd dynamic visual acuity), as well as decreased multi-sensory balance use.  The patient has diminished static vision at rest, which may be reducing her ability to compensate for dec'd VOR.  PT provided initial HEP and will progress to patient tolerance in order to optimize current functional status.    Rehab Potential Good   PT Frequency 1x / week   PT Duration 4 weeks   PT Treatment/Interventions ADLs/Self Care Home Management;Canalith Repostioning;Vestibular;Neuromuscular re-education;Functional mobility training;Patient/family education;Gait training;Therapeutic exercise;Therapeutic activities   PT Next Visit Plan Check HeP: progress gaze x 1 viewing in time, progress balance HEP, add dynamic gait activities to home.   Consulted and Agree with Plan of Care Patient      Patient will benefit from skilled therapeutic intervention in order to improve the following deficits and impairments:  Abnormal gait, Difficulty walking, Dizziness, Decreased balance, Decreased mobility  Visit Diagnosis: Dizziness and giddiness  Unsteadiness on feet  Other abnormalities of gait and mobility     Problem List Patient Active Problem List   Diagnosis Date Noted  . Edema 03/06/2014  . Encounter for therapeutic drug monitoring 10/31/2013  . S/P MVR (mitral valve replacement) 09/14/2013  . S/P AVR (aortic valve replacement) 09/14/2013  . Heart valve replaced by  other means 02/03/2011  . Long term current use of anticoagulant therapy 02/03/2011  . Aortic  valve disorders 01/06/2011  . Elevated BP 01/06/2011  . Valvular heart disease   . Chronic atrial fibrillation ([place])   . Chronic anticoagulation   . Rheumatic heart disease   . Aortic insufficiency   . Arthritis     Ashby Moskal, PT 01/24/2017, 7:30 AM  [facility] [address] [place], [place], [address] Phone: [phone]   Fax:  [phone]  Name: [name] Chung MRN: [id number] Date of Birth: 01-02-1933

## 2017-01-27 DIAGNOSIS — R740 Nonspecific elevation of levels of transaminase and lactic acid dehydrogenase [LDH]: Secondary | ICD-10-CM | POA: Diagnosis not present

## 2017-01-27 DIAGNOSIS — Z79899 Other long term (current) drug therapy: Secondary | ICD-10-CM | POA: Diagnosis not present

## 2017-02-01 ENCOUNTER — Ambulatory Visit: Payer: Medicare Other | Admitting: Rehabilitative and Restorative Service Providers"

## 2017-02-01 DIAGNOSIS — R2681 Unsteadiness on feet: Secondary | ICD-10-CM | POA: Diagnosis not present

## 2017-02-01 DIAGNOSIS — R2689 Other abnormalities of gait and mobility: Secondary | ICD-10-CM | POA: Diagnosis not present

## 2017-02-01 DIAGNOSIS — R42 Dizziness and giddiness: Secondary | ICD-10-CM | POA: Diagnosis not present

## 2017-02-01 NOTE — Patient Instructions (Signed)
Gaze Stabilization - Tip Card  1.Target must remain in focus, not blurry, and appear stationary while head is in motion. 2.Perform exercises with small head movements (45 to either side of midline). 3.Increase speed of head motion so long as target is in focus. 4.If you wear eyeglasses, be sure you can see target through lens (therapist will give specific instructions for bifocal / progressive lenses). 5.These exercises may provoke dizziness or nausea. Work through these symptoms. If too dizzy, slow head movement slightly. Rest between each exercise. 6.Exercises demand concentration; avoid distractions. 7.For safety, perform standing exercises close to a counter, wall, corner, or next to someone.  Copyright  VHI. All rights reserved.   Gaze Stabilization - Standing Feet Apart   Feet shoulder width apart, keeping eyes on target on wall 3 feet away, tilt head down slightly and move head side to side for 30 seconds. Repeat while moving head up and down for 30 seconds. *Work up to tolerating 60 seconds, as able. Do 2-3 sessions per day.   Copyright  VHI. All rights reserved.   Single Leg - Eyes Closed    While standing on left leg, close eyes and maintain balance. Hold_10___ seconds. Repeat __3__ times per session. Do __2__ sessions per day.  Copyright  VHI. All rights reserved.    Turning in Place: Solid Surface    Standing in place, lead with head and turn slowly making 1/2 turns toward left. Repeat _5_ times to each side per session. Do __1-2__ sessions per day.  Copyright  VHI. All rights reserved.

## 2017-02-01 NOTE — Therapy (Signed)
[facility] [address] [place], [place], [address] Phone: [phone]   Fax:  [phone]  Physical Therapy Treatment  Patient Details  Name: [name] Chung MRN: [id number] Date of Birth: 1932-12-22 Referring Provider: Lajean Manes, MD  Encounter Date: 02/01/2017      PT End of Session - 02/01/17 1323    Visit Number 2   Number of Visits 4   Date for PT Re-Evaluation 03/08/17   Authorization Type G code every 10th visit   PT Start Time 1320   PT Stop Time 1350   PT Time Calculation (min) 30 min   Activity Tolerance Patient tolerated treatment well   Behavior During Therapy [facility] for tasks assessed/performed      Past Medical History:  Diagnosis Date  . Aortic insufficiency   . Arthritis   . Chronic anticoagulation   . Chronic atrial fibrillation ([place])   . Edema   . Rheumatic heart disease   . Valvular heart disease     Past Surgical History:  Procedure Laterality Date  . ABDOMINAL HYSTERECTOMY    . AORTIC VALVE REPLACEMENT     1982, redo 1995 homograft, redo 2012 Medronic valve conduit-freestyle  . BACK SURGERY    . CATARACT EXTRACTION W/ INTRAOCULAR LENS  IMPLANT, BILATERAL    . MITRAL VALVE REPLACEMENT     1982, redo [place]  valve    There were no vitals filed for this visit.      Subjective Assessment - 02/01/17 1318    Subjective The patient reports that she is feeling much better.  She reports that after our evaluation last time she had sharp chest pain overnight Friday night that lasted thru [place] it hurt to move her arms.  She feels it was related to positional vertigo testing at evaluation (sidelying test discussed specifically).    She did not see an MD reporting that she couldn't leave her husband at home due to Alzheimer's disease.    Pertinent History rheumatic heart disease, aortic valve replacement, a-fib   Patient Stated Goals "I don't know - look at vertigo."    Currently in Pain? No/denies                         [facility] Adult PT Treatment/Exercise - 02/01/17 1402      Neuro Re-ed    Neuro Re-ed Details  Standing corner exercises from last session reviewed including:  feet together + eyes closed and feet together + head motion.  PT progressed activities to include:  single limb stance, + standing turns.          Vestibular Treatment/Exercise - 02/01/17 1400      Vestibular Treatment/Exercise   Vestibular Treatment Provided Gaze;Habituation   Habituation Exercises Standing Horizontal Head Turns   Gaze Exercises X1 Viewing Horizontal     Standing Horizontal Head Turns   Number of Reps  5   Symptom Description  Repeated full 180 degree turns R<>L near countertop to mimic working in [employer] Horizontal   Foot Position standing feet apart   Comments x 1 viewing x 30 seconds with instruction to increase speed of movement while performing gaze stabilization.               PT Education - 02/01/17 1339    Education provided Yes   Education Details HEP: gaze x 1 viewing, single limb stance, turning in place  Person(s) Educated Patient   Methods Explanation;Demonstration;Handout   Comprehension Verbalized understanding;Returned demonstration             PT Long Term Goals - Feb 21, 2017 1332      PT LONG TERM GOAL #1   Title The patient will be indep with HEP for habituation, gaze x 1 viewing.    Baseline Met on Feb 21, 2017   Time 4   Period Weeks   Status Achieved     PT LONG TERM GOAL #2   Title The patient will improve on dizziness handicap inventory by 15% to demo reduced self perception of symptoms.   Baseline Scores 0% today for [place].   Time 4   Period Weeks   Status Achieved     PT LONG TERM GOAL #3   Title The patient will demo single leg stance x 10 seconds right and left LE for improved balance control.   Baseline Met on 02/21/17   Time 4   Period Weeks   Status Achieved     PT  LONG TERM GOAL #4   Title The patient will demo standing x 30 seconds with eyes closed + feet together.   Baseline Met on 2017/02/21   Time 4   Period Weeks   Status Achieved               Plan - Feb 21, 2017 1354    Clinical Impression Statement The patient met all LTGs today noting significant improvement in symptoms.  PT updated her HEP and discharged with instructions at continuing post d/c.    PT Treatment/Interventions ADLs/Self Care Home Management;Canalith Repostioning;Vestibular;Neuromuscular re-education;Functional mobility training;Patient/family education;Gait training;Therapeutic exercise;Therapeutic activities   PT Next Visit Plan Discharge today.   Consulted and Agree with Plan of Care Patient      Patient will benefit from skilled therapeutic intervention in order to improve the following deficits and impairments:  Abnormal gait, Difficulty walking, Dizziness, Decreased balance, Decreased mobility  Visit Diagnosis: Dizziness and giddiness  Unsteadiness on feet  Other abnormalities of gait and mobility       G-Codes - 21-Feb-2017 1356    Functional Assessment Tool Used (Outpatient Only) DHI improved to 0%.   Functional Limitation Self care   Mobility: Walking and Moving Around Goal Status [phone]) At least 1 percent but less than 20 percent impaired, limited or restricted   Self Care Current Status ([id number]) At least 1 percent but less than 20 percent impaired, limited or restricted     PHYSICAL THERAPY DISCHARGE SUMMARY  Visits from Start of Care: 2  Current functional level related to goals / functional outcomes: See above   Remaining deficits: Patient reports 1-2 episodes of mild dizzy sensation when turning quickly in the kitchen.  PT provided habituation exercise to address.   Education / Equipment: HEP progression.  Plan: Patient agrees to discharge.  Patient goals were met. Patient is being discharged due to meeting the stated rehab goals.  ?????          Thank you for the referral of this patient. Rudell Cobb, MPT  [name] Chung 02/21/17, 2:04 PM  [facility] [address] [place], [place], [address] Phone: [phone]   Fax:  [phone]  Name: [name] Chung MRN: [id number] Date of Birth: 01/20/1933

## 2017-02-03 ENCOUNTER — Other Ambulatory Visit: Payer: Self-pay | Admitting: Cardiology

## 2017-02-03 DIAGNOSIS — I10 Essential (primary) hypertension: Secondary | ICD-10-CM | POA: Diagnosis not present

## 2017-02-03 DIAGNOSIS — H1851 Endothelial corneal dystrophy: Secondary | ICD-10-CM | POA: Diagnosis not present

## 2017-02-03 DIAGNOSIS — H35371 Puckering of macula, right eye: Secondary | ICD-10-CM | POA: Diagnosis not present

## 2017-02-10 ENCOUNTER — Encounter: Payer: Medicare Other | Admitting: Rehabilitative and Restorative Service Providers"

## 2017-02-16 ENCOUNTER — Encounter: Payer: Self-pay | Admitting: Family Medicine

## 2017-02-16 ENCOUNTER — Ambulatory Visit (INDEPENDENT_AMBULATORY_CARE_PROVIDER_SITE_OTHER): Payer: Medicare Other | Admitting: Pharmacist

## 2017-02-16 ENCOUNTER — Ambulatory Visit (INDEPENDENT_AMBULATORY_CARE_PROVIDER_SITE_OTHER): Payer: Medicare Other | Admitting: Family Medicine

## 2017-02-16 VITALS — BP 124/80 | HR 114 | Temp 98.5°F | Resp 17 | Ht 65.5 in | Wt 137.0 lb

## 2017-02-16 DIAGNOSIS — Z5181 Encounter for therapeutic drug level monitoring: Secondary | ICD-10-CM

## 2017-02-16 DIAGNOSIS — Z7901 Long term (current) use of anticoagulants: Secondary | ICD-10-CM

## 2017-02-16 DIAGNOSIS — I359 Nonrheumatic aortic valve disorder, unspecified: Secondary | ICD-10-CM | POA: Diagnosis not present

## 2017-02-16 DIAGNOSIS — Z952 Presence of prosthetic heart valve: Secondary | ICD-10-CM

## 2017-02-16 DIAGNOSIS — L237 Allergic contact dermatitis due to plants, except food: Secondary | ICD-10-CM

## 2017-02-16 LAB — POCT INR: INR: 2.3

## 2017-02-16 MED ORDER — LORATADINE 10 MG PO TABS
10.0000 mg | ORAL_TABLET | Freq: Every day | ORAL | 3 refills | Status: DC
Start: 2017-02-16 — End: 2017-03-23

## 2017-02-16 MED ORDER — RANITIDINE HCL 150 MG PO TABS: 150.0000 mg | ORAL_TABLET | Freq: Two times a day (BID) | ORAL | 0 refills | Status: DC

## 2017-02-16 MED ORDER — METHYLPREDNISOLONE ACETATE 80 MG/ML IJ SUSP
80.0000 mg | Freq: Once | INTRAMUSCULAR | Status: AC
  Administered 2017-02-16: 80 mg via INTRAMUSCULAR

## 2017-02-16 MED ORDER — PREDNISONE 10 MG PO TABS: ORAL_TABLET | ORAL | 0 refills | Status: DC

## 2017-02-16 NOTE — Progress Notes (Signed)
Chief Complaint  Patient presents with  . Poison Ivy    HPI Pt reports that she pulled a week at her neighbors and saw a sap She states that she erupted in painful red itchy lesions that has spread to her face since touching her face She reports that her eyes are itching as well   Past Medical History:  Diagnosis Date  . Aortic insufficiency   . Arthritis   . Chronic anticoagulation   . Chronic atrial fibrillation ([place])   . Edema   . Rheumatic heart disease   . Valvular heart disease     Current Outpatient Prescriptions  Medication Sig Dispense Refill  . butalbital-acetaminophen-caffeine (FIORICET, ESGIC) 50-325-40 MG per tablet 1 tablet 2 (two) times daily as needed.     . digoxin (LANOXIN) 0.125 MG tablet TAKE 1 TABLET EVERY DAY 90 tablet 3  . furosemide (LASIX) 20 MG tablet TAKE 1 TABLET EVERY DAY 90 tablet 3  . hydrochlorothiazide (HYDRODIURIL) 50 MG tablet TAKE 1 TABLET EVERY DAY 90 tablet 3  . LORazepam (ATIVAN) 1 MG tablet Take 1 mg by mouth as needed.      . potassium chloride (K-DUR) 10 MEQ tablet Take 1 tablet (10 mEq total) by mouth 2 (two) times daily. 180 tablet 3  . potassium chloride (K-DUR) 10 MEQ tablet TAKE 1 TABLET TWICE DAILY 180 tablet 3  . temazepam (RESTORIL) 30 MG capsule Take 30 mg by mouth daily.     Marland Kitchen warfarin (COUMADIN) 10 MG tablet Take 1 tablet by mouth daily or as directed by coumadin clinic 90 tablet 1  . loratadine (CLARITIN) 10 MG tablet Take 1 tablet (10 mg total) by mouth daily. 30 tablet 3  . predniSONE (DELTASONE) 10 MG tablet Take 4 tablets day 1 and 2, 3 tablets day 3 and 4, 2 tablets day 5 and 6, 1 tablet day 7 and 8 20 tablet 0  . ranitidine (ZANTAC) 150 MG tablet Take 1 tablet (150 mg total) by mouth 2 (two) times daily. 60 tablet 0   Current Facility-Administered Medications  Medication Dose Route Frequency Provider Last Rate Last Dose  . methylPREDNISolone acetate (DEPO-MEDROL) injection 80 mg  80 mg Intramuscular Once Forrest Moron, MD        Allergies:  Allergies  Allergen Reactions  . Asa Buff (Mag [Buffered Aspirin] Other (See Comments)    On coumadin  . Codeine   . Mirtazapine Nausea Only  . Ultram [Tramadol Hcl]     Past Surgical History:  Procedure Laterality Date  . ABDOMINAL HYSTERECTOMY    . AORTIC VALVE REPLACEMENT     1982, redo 1995 homograft, redo 2012 Medronic valve conduit-freestyle  . BACK SURGERY    . CATARACT EXTRACTION W/ INTRAOCULAR LENS  IMPLANT, BILATERAL    . MITRAL VALVE REPLACEMENT     1982, redo [place]  valve    Social History   Social History  . Marital status: Married    Spouse name: N/A  . Number of children: 0  . Years of education: N/A   Occupational History  . 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 Smoking status: Never Smoker  . Smokeless tobacco: Never Used  . Alcohol use No  . Drug use: No  . Sexual activity: No   Other Topics Concern  . None   Social History Narrative  . None    ROS See hpi  Objective: Vitals:   02/16/17 1702  BP: 124/80  Pulse: (!) 114  Resp: 17  Temp: 98.5 F (36.9 C)  TempSrc: Oral  SpO2: 98%  Weight: 137 lb (62.1 kg)  Height: 5' 5.5" (1.664 m)    Physical Exam  Constitutional: She appears well-developed and well-nourished.  HENT:  Head: Normocephalic and atraumatic.  Nose: Nose normal.  Mouth/Throat: Oropharynx is clear and moist.  Pulmonary/Chest: [place] normal.  Psychiatric: She has a normal mood and affect. Her behavior is normal. Judgment and thought content normal.    Erythema and pustular lesions between the web of fingers on her right hand and wrist No laceration   Assessment and Plan Jeaninne was seen today for poison ivy.  Diagnoses and all orders for this visit:  Poison ivy dermatitis-  Advised solumedrol  Discussed taking antihistamines -     methylPREDNISolone acetate (DEPO-MEDROL) injection 80 mg; Inject 1 mL (80 mg total) into the muscle once. -      predniSONE (DELTASONE) 10 MG tablet; Take 4 tablets day 1 and 2, 3 tablets day 3 and 4, 2 tablets day 5 and 6, 1 tablet day 7 and 8 -     loratadine (CLARITIN) 10 MG tablet; Take 1 tablet (10 mg total) by mouth daily. -     ranitidine (ZANTAC) 150 MG tablet; Take 1 tablet (150 mg total) by mouth 2 (two) times daily.     [place]

## 2017-02-16 NOTE — Patient Instructions (Addendum)
IF you received an x-ray today, you will receive an invoice from [facility]. Please contact [facility] at [phone] with questions or concerns regarding your invoice.   IF you received labwork today, you will receive an invoice from [facility]. Please contact [facility] at [phone] with questions or concerns regarding your invoice.   Our billing staff will not be able to assist you with questions regarding bills from these companies.  You will be contacted with the lab results as soon as they are available. The fastest way to get your results is to activate your My Chart account. Instructions are located on the last page of this paperwork. If you have not heard from [place] regarding the results in 2 weeks, please contact this office.      Poison Ivy Dermatitis Poison ivy dermatitis is inflammation of the skin that is caused by the allergens on the leaves of the poison ivy plant. The skin reaction often involves redness, swelling, blisters, and extreme itching. What are the causes? This condition is caused by a specific chemical (urushiol) found in the sap of the poison ivy plant. This chemical is sticky and can be easily spread to people, animals, and objects. You can get poison ivy dermatitis by:  Having direct contact with a poison ivy plant.  Touching animals, other people, or objects that have come in contact with poison ivy and have the chemical on them. What increases the risk? This condition is more likely to develop in:  People who are outdoors often.  People who go outdoors without wearing protective clothing, such as closed shoes, long pants, and a long-sleeved shirt. What are the signs or symptoms? Symptoms of this condition include:  Redness and itching.  A rash that often includes bumps and blisters. The rash usually appears 48 hours after exposure.  Swelling. This may occur if the reaction is more severe. Symptoms usually last for 1-2 weeks.  However, the first time you develop this condition, symptoms may last 3-4 weeks. How is this diagnosed? This condition may be diagnosed based on your symptoms and a physical exam. Your health care provider may also ask you about any recent outdoor activity. How is this treated? Treatment for this condition will vary depending on how severe it is. Treatment may include:  Hydrocortisone creams or calamine lotions to relieve itching.  Oatmeal baths to soothe the skin.  Over-the-counter antihistamine tablets.  Oral steroid medicine for more severe outbreaks. Follow these instructions at home:  Take or apply over-the-counter and prescription medicines only as told by your health care provider.  Wash exposed skin as soon as possible with soap and cold water.  Use hydrocortisone creams or calamine lotion as needed to soothe the skin and relieve itching.  Take oatmeal baths as needed. Use colloidal oatmeal. You can get this at your local pharmacy or grocery store. Follow the instructions on the packaging.  Do not scratch or rub your skin.  While you have the rash, wash clothes right after you wear them. How is this prevented?  Learn to identify the poison ivy plant and avoid contact with the plant. This plant can be recognized by the number of leaves. Generally, poison ivy has three leaves with flowering branches on a single stem. The leaves are typically glossy, and they have jagged edges that come to a point at the front.  If you have been exposed to poison ivy, thoroughly wash with soap and water right away. You have about 30 minutes  to remove the plant resin before it will cause the rash. Be sure to wash under your fingernails because any plant resin there will continue to spread the rash.  When hiking or camping, wear clothes that will help you to avoid exposure on the skin. This includes long pants, a long-sleeved shirt, tall socks, and hiking boots. You can also apply preventive lotion  to your skin to help limit exposure.  If you suspect that your clothes or outdoor gear came in contact with poison ivy, rinse them off outside with a garden hose before you bring them inside your house. Contact a health care provider if:  You have open sores in the rash area.  You have more redness, swelling, or pain in the affected area.  You have redness that spreads beyond the rash area.  You have fluid, blood, or pus coming from the affected area.  You have a fever.  You have a rash over a large area of your body.  You have a rash on your eyes, mouth, or genitals.  Your rash does not improve after a few days. Get help right away if:  Your face swells or your eyes swell shut.  You have trouble breathing.  You have trouble swallowing. This information is not intended to replace advice given to you by your health care provider. Make sure you discuss any questions you have with your health care provider. Document Released: 09/18/2000 Document Revised: 02/27/2016 Document Reviewed: 02/27/2015 Elsevier Interactive Patient Education  2017 [employer].

## 2017-03-03 ENCOUNTER — Encounter: Payer: Self-pay | Admitting: Cardiology

## 2017-03-10 DIAGNOSIS — H1851 Endothelial corneal dystrophy: Secondary | ICD-10-CM | POA: Diagnosis not present

## 2017-03-10 DIAGNOSIS — I10 Essential (primary) hypertension: Secondary | ICD-10-CM | POA: Diagnosis not present

## 2017-03-10 DIAGNOSIS — H35371 Puckering of macula, right eye: Secondary | ICD-10-CM | POA: Diagnosis not present

## 2017-03-10 DIAGNOSIS — Z79899 Other long term (current) drug therapy: Secondary | ICD-10-CM | POA: Diagnosis not present

## 2017-03-19 NOTE — Progress Notes (Signed)
Cardiology Office Note    Date:  03/23/2017   ID:  [name] Chung, DOB [birth year], MRN [id number]  PCP:  Lajean Manes, MD  Cardiologist:  [name] Ashbaugh Martinique, MD    History of Present Illness:  [name] Chung is a 83 y.o. female with a history of Rheumatic heart disease. She underwent redo open heart surgery in December 2012 at [facility]. This included redo aortic valve replacement with a Medtronic freestyle aortic root conduit. She also underwent tricuspid valve annuloplasty and repair. The mitral valve replacement was unchanged. She had initial surgery for rheumatic heart disease in 1982 with both aortic and mitral valve replacements with Bjork Shiley valves. She had repeat surgery in 1995 with a homograft aortic valve and a St. Jude mechanical mitral valve replacement. She is on chronic Coumadin.  On follow up today she is doing well from a cardiac standpoint. She is tired from being the primary caretaker for her husband who has progressive dementia.  No chest pain or dyspnea. No palpitations. Thinks she is doing well for her age.    Past Medical History:  Diagnosis Date  . Aortic insufficiency   . Arthritis   . Chronic anticoagulation   . Chronic atrial fibrillation ([place])   . Edema   . Rheumatic heart disease   . Valvular heart disease     Past Surgical History:  Procedure Laterality Date  . ABDOMINAL HYSTERECTOMY    . AORTIC VALVE REPLACEMENT     1982, redo 1995 homograft, redo 2012 Medronic valve conduit-freestyle  . BACK SURGERY    . CATARACT EXTRACTION W/ INTRAOCULAR LENS  IMPLANT, BILATERAL    . MITRAL VALVE REPLACEMENT     1982, redo 1995 St Jude  valve    Current Medications: Outpatient Medications Prior to Visit  Medication Sig Dispense Refill  . butalbital-acetaminophen-caffeine (FIORICET, ESGIC) 50-325-40 MG per tablet Take 1 tablet by mouth 2 (two) times daily as needed for headache or migraine.     Marland Kitchen LORazepam (ATIVAN) 1 MG tablet Take 1 mg by mouth  daily as needed for anxiety.     . potassium chloride (K-DUR) 10 MEQ tablet Take 1 tablet (10 mEq total) by mouth 2 (two) times daily. 180 tablet 3  . temazepam (RESTORIL) 30 MG capsule Take 30 mg by mouth daily.     Marland Kitchen warfarin (COUMADIN) 10 MG tablet Take 1 tablet by mouth daily or as directed by coumadin clinic 90 tablet 1  . digoxin (LANOXIN) 0.125 MG tablet TAKE 1 TABLET EVERY DAY (Patient not taking: Reported on 03/23/2017) 90 tablet 3  . furosemide (LASIX) 20 MG tablet TAKE 1 TABLET EVERY DAY (Patient not taking: Reported on 03/23/2017) 90 tablet 3  . hydrochlorothiazide (HYDRODIURIL) 50 MG tablet TAKE 1 TABLET EVERY DAY (Patient not taking: Reported on 03/23/2017) 90 tablet 3  . loratadine (CLARITIN) 10 MG tablet Take 1 tablet (10 mg total) by mouth daily. (Patient not taking: Reported on 03/23/2017) 30 tablet 3  . potassium chloride (K-DUR) 10 MEQ tablet TAKE 1 TABLET TWICE DAILY 180 tablet 3  . predniSONE (DELTASONE) 10 MG tablet Take 4 tablets day 1 and 2, 3 tablets day 3 and 4, 2 tablets day 5 and 6, 1 tablet day 7 and 8 (Patient not taking: Reported on 03/23/2017) 20 tablet 0  . ranitidine (ZANTAC) 150 MG tablet Take 1 tablet (150 mg total) by mouth 2 (two) times daily. (Patient not taking: Reported on 03/23/2017) 60 tablet 0   No facility-administered  medications prior to visit.      Allergies:   Asa buff (mag [buffered aspirin]; Codeine; Mirtazapine; and Ultram [tramadol hcl]   Social History   Social History  . Marital status: Married    Spouse name: N/A  . Number of children: 0  . Years of education: N/A   Occupational History  . 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 Smoking status: Never Smoker  . Smokeless tobacco: Never Used  . Alcohol use No  . Drug use: No  . Sexual activity: No   Other Topics Concern  . None   Social History Narrative  . None     Family History:  The patient's family history includes Diabetes in her mother; Heart attack  in her mother; Other in her father; Stroke in her mother.   ROS:   Please see the history of present illness.    ROS All other systems reviewed and are negative.   PHYSICAL EXAM:   VS:  BP (!) 110/50 (BP Location: Left Arm)   Pulse 74   Ht 5' 5.5" (1.664 m)   Wt 137 lb 9.6 oz (62.4 kg)   BMI 22.55 kg/m    She is an elderly female in no acute distress. HEENT is unremarkable. Neck is supple no JVD, adenopathy, thyromegaly, or bruits. Carotid upstrokes are good. Lungs are clear. Cardiac exam reveals a good mechanical mitral valve click. There is a grade 2-9/4 systolic murmur heard best at the left sternal border. This radiates into the carotids. There is no diastolic murmur. PMI is normal. There is no S3.Pulse reveals occasional extrasystole. Abdomen is soft and nontender without masses or bruits. Femoral and pedal pulses are 2+ and symmetric. She has tr edema. Skin is warm and dry. She is alert and oriented x3. Cranial nerves II through XII are intact.  Wt Readings from Last 3 Encounters:  03/23/17 137 lb 9.6 oz (62.4 kg)  02/16/17 137 lb (62.1 kg)  09/15/16 139 lb 9.6 oz (63.3 kg)      Studies/Labs Reviewed:   EKG:  EKG is ordered today.  The ekg ordered today demonstrates Afib with rate 74. Nonspecific ST-T wave abnormality. I have personally reviewed and interpreted this study.   Recent Labs: No results found for requested labs within last 8760 hours.   Lipid Panel No results found for: CHOL, TRIG, HDL, CHOLHDL, VLDL, LDLCALC, LDLDIRECT  Additional studies/ records that were reviewed today include:  Labs dated 01/13/17: cholesterol 236, triglycerides 171, HDL 74, LDL 127.  03/10/17: CMET normal.   ASSESSMENT:    1. Aortic valve disorder   2. Heart valve replaced   3. Long term current use of anticoagulant therapy   4. S/P MVR (mitral valve replacement)   5. S/P AVR (aortic valve replacement)   6. Chronic atrial fibrillation (HCC)      PLAN:  In order of problems listed  above:   1. Valvular heart disease status post redo aortic valve replacement x3 and redo mitral valve surgery x2. Valve function is stable by exam. Continue on coumadin. SBE prophylaxis. Her last surgery was in 2012 and last Echo in 2015. I suggested updating her Echo at this time but she states she is not interested in looking for any problems since she is not going to have anything else done so she deferred.  2. Chronic anticoagulation. Continue coumadin. Check INR today.  3.  Atrial fibrillation. Chronic. Rate controlled.  Continue dig and coumadin.  Will follow up in 6 months.    Medication Adjustments/Labs and Tests Ordered: Current medicines are reviewed at length with the patient today.  Concerns regarding medicines are outlined above.  Medication changes, Labs and Tests ordered today are listed in the Patient Instructions below. Patient Instructions  We will continue your current therapy  I will see you in 6 months.     Signed, [name] Zeiter Martinique, MD  03/23/2017 3:56 PM    [place] [address] [place], [place], [address] [phone]

## 2017-03-23 ENCOUNTER — Ambulatory Visit (INDEPENDENT_AMBULATORY_CARE_PROVIDER_SITE_OTHER): Payer: Medicare Other | Admitting: Cardiology

## 2017-03-23 ENCOUNTER — Encounter: Payer: Self-pay | Admitting: Cardiology

## 2017-03-23 ENCOUNTER — Ambulatory Visit (INDEPENDENT_AMBULATORY_CARE_PROVIDER_SITE_OTHER): Payer: Medicare Other | Admitting: Pharmacist

## 2017-03-23 VITALS — BP 110/50 | HR 74 | Ht 65.5 in | Wt 137.6 lb

## 2017-03-23 DIAGNOSIS — Z7901 Long term (current) use of anticoagulants: Secondary | ICD-10-CM

## 2017-03-23 DIAGNOSIS — I482 Chronic atrial fibrillation, unspecified: Secondary | ICD-10-CM

## 2017-03-23 DIAGNOSIS — Z952 Presence of prosthetic heart valve: Secondary | ICD-10-CM

## 2017-03-23 DIAGNOSIS — I359 Nonrheumatic aortic valve disorder, unspecified: Secondary | ICD-10-CM

## 2017-03-23 DIAGNOSIS — Z5181 Encounter for therapeutic drug level monitoring: Secondary | ICD-10-CM

## 2017-03-23 LAB — POCT INR: INR: 2.9

## 2017-03-23 NOTE — Patient Instructions (Signed)
We will continue your current therapy  I will see you in 6 months. 

## 2017-05-04 ENCOUNTER — Ambulatory Visit (INDEPENDENT_AMBULATORY_CARE_PROVIDER_SITE_OTHER): Payer: Medicare Other | Admitting: Pharmacist

## 2017-05-04 DIAGNOSIS — Z5181 Encounter for therapeutic drug level monitoring: Secondary | ICD-10-CM | POA: Diagnosis not present

## 2017-05-04 LAB — POCT INR: INR: 3.4

## 2017-06-15 ENCOUNTER — Ambulatory Visit (INDEPENDENT_AMBULATORY_CARE_PROVIDER_SITE_OTHER): Payer: Medicare Other | Admitting: Pharmacist Clinician (PhC)/ Clinical Pharmacy Specialist

## 2017-06-15 DIAGNOSIS — Z952 Presence of prosthetic heart valve: Secondary | ICD-10-CM

## 2017-06-15 DIAGNOSIS — Z5181 Encounter for therapeutic drug level monitoring: Secondary | ICD-10-CM

## 2017-06-15 LAB — POCT INR: INR: >8

## 2017-06-16 LAB — PROTIME-INR
INR: 9.9 — AB (ref 0.8–1.2)
Prothrombin Time: 105 s — ABNORMAL HIGH (ref 9.1–12.0)

## 2017-06-17 ENCOUNTER — Ambulatory Visit (INDEPENDENT_AMBULATORY_CARE_PROVIDER_SITE_OTHER): Payer: Medicare Other | Admitting: Pharmacist

## 2017-06-17 DIAGNOSIS — Z5181 Encounter for therapeutic drug level monitoring: Secondary | ICD-10-CM

## 2017-06-17 DIAGNOSIS — Z7901 Long term (current) use of anticoagulants: Secondary | ICD-10-CM

## 2017-06-17 DIAGNOSIS — Z952 Presence of prosthetic heart valve: Secondary | ICD-10-CM

## 2017-06-17 LAB — POCT INR: INR: 2.9

## 2017-06-22 DIAGNOSIS — R58 Hemorrhage, not elsewhere classified: Secondary | ICD-10-CM | POA: Diagnosis not present

## 2017-06-24 DIAGNOSIS — R58 Hemorrhage, not elsewhere classified: Secondary | ICD-10-CM | POA: Diagnosis not present

## 2017-06-25 ENCOUNTER — Other Ambulatory Visit: Payer: Self-pay

## 2017-06-25 MED ORDER — DIGOXIN 125 MCG PO TABS
0.1250 mg | ORAL_TABLET | Freq: Every day | ORAL | 9 refills | Status: DC
Start: 2017-06-25 — End: 2017-06-28

## 2017-06-25 MED ORDER — FUROSEMIDE 20 MG PO TABS: 20.0000 mg | ORAL_TABLET | Freq: Every day | ORAL | 9 refills | Status: DC

## 2017-06-28 ENCOUNTER — Other Ambulatory Visit: Payer: Self-pay | Admitting: Cardiology

## 2017-06-28 ENCOUNTER — Other Ambulatory Visit: Payer: Self-pay | Admitting: Pharmacist Clinician (PhC)/ Clinical Pharmacy Specialist

## 2017-06-28 MED ORDER — WARFARIN SODIUM 5 MG PO TABS: ORAL_TABLET | ORAL | 1 refills | Status: DC

## 2017-06-28 MED ORDER — DIGOXIN 125 MCG PO TABS: 0.1250 mg | ORAL_TABLET | Freq: Every day | ORAL | 2 refills | Status: DC

## 2017-06-28 MED ORDER — FUROSEMIDE 20 MG PO TABS: 20.0000 mg | ORAL_TABLET | Freq: Every day | ORAL | 2 refills | Status: DC

## 2017-06-28 NOTE — Telephone Encounter (Signed)
Rx(s) sent to pharmacy electronically.  

## 2017-06-28 NOTE — Telephone Encounter (Signed)
New message     Take [facility] off of her account medication called into wrong pharmacy      *STAT* If patient is at the pharmacy, call can be transferred to refill team.   1. Which medications need to be refilled? (please list name of each medication and dose if known)   digoxin (LANOXIN) 0.125 MG tablet Take 1 tablet (0.125 mg total) by mouth daily.    furosemide (LASIX) 20 MG tablet Take 1 tablet (20 mg total) by mouth daily.     2. Which pharmacy/location (including street and city if local pharmacy) is medication to be sent to? [facility] order   3. Do they need a 30 day or 90 day supply?  [place]

## 2017-06-29 ENCOUNTER — Ambulatory Visit (INDEPENDENT_AMBULATORY_CARE_PROVIDER_SITE_OTHER): Payer: Medicare Other | Admitting: Pharmacist Clinician (PhC)/ Clinical Pharmacy Specialist

## 2017-06-29 DIAGNOSIS — Z5181 Encounter for therapeutic drug level monitoring: Secondary | ICD-10-CM

## 2017-06-29 DIAGNOSIS — Z952 Presence of prosthetic heart valve: Secondary | ICD-10-CM | POA: Diagnosis not present

## 2017-06-29 LAB — POCT INR: INR: 4.3

## 2017-07-05 ENCOUNTER — Telehealth: Payer: Self-pay | Admitting: Pharmacist

## 2017-07-05 NOTE — Telephone Encounter (Signed)
Patient calling due to increased lower extremities edema.  No change in daily weight, no SOB, and not significant improvement after increasing diuretic x 3 days.  Plan: 1. Start using compression stockings  2. PCP to re-assess swelling next week on Oct/10

## 2017-07-13 DIAGNOSIS — I480 Paroxysmal atrial fibrillation: Secondary | ICD-10-CM | POA: Diagnosis not present

## 2017-07-13 DIAGNOSIS — I2729 Other secondary pulmonary hypertension: Secondary | ICD-10-CM | POA: Diagnosis not present

## 2017-07-13 DIAGNOSIS — Z79899 Other long term (current) drug therapy: Secondary | ICD-10-CM | POA: Diagnosis not present

## 2017-07-13 DIAGNOSIS — R310 Gross hematuria: Secondary | ICD-10-CM | POA: Diagnosis not present

## 2017-07-13 DIAGNOSIS — Z23 Encounter for immunization: Secondary | ICD-10-CM | POA: Diagnosis not present

## 2017-07-13 DIAGNOSIS — D539 Nutritional anemia, unspecified: Secondary | ICD-10-CM | POA: Diagnosis not present

## 2017-07-13 DIAGNOSIS — R600 Localized edema: Secondary | ICD-10-CM | POA: Diagnosis not present

## 2017-07-13 DIAGNOSIS — D696 Thrombocytopenia, unspecified: Secondary | ICD-10-CM | POA: Diagnosis not present

## 2017-07-14 ENCOUNTER — Other Ambulatory Visit: Payer: Self-pay | Admitting: Geriatric Medicine

## 2017-07-14 DIAGNOSIS — R600 Localized edema: Secondary | ICD-10-CM

## 2017-07-14 DIAGNOSIS — R7401 Elevation of levels of liver transaminase levels: Secondary | ICD-10-CM

## 2017-07-14 DIAGNOSIS — R740 Nonspecific elevation of levels of transaminase and lactic acid dehydrogenase [LDH]: Principal | ICD-10-CM

## 2017-07-15 ENCOUNTER — Ambulatory Visit
Admission: RE | Admit: 2017-07-15 | Discharge: 2017-07-15 | Disposition: A | Discharge status: Home / Self Care | Payer: Medicare Other | Source: Ambulatory Visit | Attending: Geriatric Medicine | Admitting: Geriatric Medicine

## 2017-07-15 DIAGNOSIS — R740 Nonspecific elevation of levels of transaminase and lactic acid dehydrogenase [LDH]: Principal | ICD-10-CM

## 2017-07-15 DIAGNOSIS — R600 Localized edema: Secondary | ICD-10-CM

## 2017-07-15 DIAGNOSIS — R7401 Elevation of levels of liver transaminase levels: Secondary | ICD-10-CM

## 2017-07-19 ENCOUNTER — Other Ambulatory Visit: Payer: Medicare Other

## 2017-07-20 ENCOUNTER — Ambulatory Visit
Admission: RE | Admit: 2017-07-20 | Discharge: 2017-07-20 | Disposition: A | Discharge status: Home / Self Care | Payer: Medicare Other | Source: Ambulatory Visit | Attending: Geriatric Medicine | Admitting: Geriatric Medicine

## 2017-07-20 ENCOUNTER — Other Ambulatory Visit: Payer: Self-pay | Admitting: Geriatric Medicine

## 2017-07-20 DIAGNOSIS — R600 Localized edema: Secondary | ICD-10-CM | POA: Diagnosis not present

## 2017-07-20 DIAGNOSIS — I872 Venous insufficiency (chronic) (peripheral): Secondary | ICD-10-CM | POA: Diagnosis not present

## 2017-07-20 DIAGNOSIS — R269 Unspecified abnormalities of gait and mobility: Secondary | ICD-10-CM | POA: Diagnosis not present

## 2017-07-20 DIAGNOSIS — R740 Nonspecific elevation of levels of transaminase and lactic acid dehydrogenase [LDH]: Principal | ICD-10-CM

## 2017-07-20 DIAGNOSIS — R7401 Elevation of levels of liver transaminase levels: Secondary | ICD-10-CM

## 2017-07-20 DIAGNOSIS — K8020 Calculus of gallbladder without cholecystitis without obstruction: Secondary | ICD-10-CM | POA: Diagnosis not present

## 2017-07-20 MED ORDER — IOPAMIDOL (ISOVUE-300) INJECTION 61%
100.0000 mL | Freq: Once | INTRAVENOUS | Status: AC | PRN
  Administered 2017-07-20: 100 mL via INTRAVENOUS

## 2017-07-24 DIAGNOSIS — Z7901 Long term (current) use of anticoagulants: Secondary | ICD-10-CM | POA: Diagnosis not present

## 2017-07-24 DIAGNOSIS — Z952 Presence of prosthetic heart valve: Secondary | ICD-10-CM | POA: Diagnosis not present

## 2017-07-24 DIAGNOSIS — E785 Hyperlipidemia, unspecified: Secondary | ICD-10-CM | POA: Diagnosis not present

## 2017-07-24 DIAGNOSIS — I272 Pulmonary hypertension, unspecified: Secondary | ICD-10-CM | POA: Diagnosis not present

## 2017-07-24 DIAGNOSIS — M1990 Unspecified osteoarthritis, unspecified site: Secondary | ICD-10-CM | POA: Diagnosis not present

## 2017-07-24 DIAGNOSIS — I099 Rheumatic heart disease, unspecified: Secondary | ICD-10-CM | POA: Diagnosis not present

## 2017-07-24 DIAGNOSIS — L97221 Non-pressure chronic ulcer of left calf limited to breakdown of skin: Secondary | ICD-10-CM | POA: Diagnosis not present

## 2017-07-24 DIAGNOSIS — I482 Chronic atrial fibrillation: Secondary | ICD-10-CM | POA: Diagnosis not present

## 2017-07-24 DIAGNOSIS — I872 Venous insufficiency (chronic) (peripheral): Secondary | ICD-10-CM | POA: Diagnosis not present

## 2017-07-24 DIAGNOSIS — L97211 Non-pressure chronic ulcer of right calf limited to breakdown of skin: Secondary | ICD-10-CM | POA: Diagnosis not present

## 2017-07-24 DIAGNOSIS — D696 Thrombocytopenia, unspecified: Secondary | ICD-10-CM | POA: Diagnosis not present

## 2017-07-26 DIAGNOSIS — D6489 Other specified anemias: Secondary | ICD-10-CM | POA: Diagnosis not present

## 2017-07-26 DIAGNOSIS — Z79899 Other long term (current) drug therapy: Secondary | ICD-10-CM | POA: Diagnosis not present

## 2017-07-27 ENCOUNTER — Ambulatory Visit (INDEPENDENT_AMBULATORY_CARE_PROVIDER_SITE_OTHER): Payer: Medicare Other | Admitting: Pharmacist Clinician (PhC)/ Clinical Pharmacy Specialist

## 2017-07-27 DIAGNOSIS — L97221 Non-pressure chronic ulcer of left calf limited to breakdown of skin: Secondary | ICD-10-CM | POA: Diagnosis not present

## 2017-07-27 DIAGNOSIS — Z5181 Encounter for therapeutic drug level monitoring: Secondary | ICD-10-CM | POA: Diagnosis not present

## 2017-07-27 DIAGNOSIS — K921 Melena: Secondary | ICD-10-CM | POA: Diagnosis not present

## 2017-07-27 DIAGNOSIS — R109 Unspecified abdominal pain: Secondary | ICD-10-CM | POA: Diagnosis not present

## 2017-07-27 DIAGNOSIS — I272 Pulmonary hypertension, unspecified: Secondary | ICD-10-CM | POA: Diagnosis not present

## 2017-07-27 DIAGNOSIS — K7460 Unspecified cirrhosis of liver: Secondary | ICD-10-CM | POA: Diagnosis not present

## 2017-07-27 DIAGNOSIS — D696 Thrombocytopenia, unspecified: Secondary | ICD-10-CM | POA: Diagnosis not present

## 2017-07-27 DIAGNOSIS — I872 Venous insufficiency (chronic) (peripheral): Secondary | ICD-10-CM | POA: Diagnosis not present

## 2017-07-27 DIAGNOSIS — I099 Rheumatic heart disease, unspecified: Secondary | ICD-10-CM | POA: Diagnosis not present

## 2017-07-27 DIAGNOSIS — Z952 Presence of prosthetic heart valve: Secondary | ICD-10-CM

## 2017-07-27 DIAGNOSIS — K7290 Hepatic failure, unspecified without coma: Secondary | ICD-10-CM | POA: Diagnosis not present

## 2017-07-27 DIAGNOSIS — R748 Abnormal levels of other serum enzymes: Secondary | ICD-10-CM | POA: Diagnosis not present

## 2017-07-27 DIAGNOSIS — L97211 Non-pressure chronic ulcer of right calf limited to breakdown of skin: Secondary | ICD-10-CM | POA: Diagnosis not present

## 2017-07-27 LAB — POCT INR: INR: >8

## 2017-07-28 DIAGNOSIS — I099 Rheumatic heart disease, unspecified: Secondary | ICD-10-CM | POA: Diagnosis not present

## 2017-07-28 DIAGNOSIS — D696 Thrombocytopenia, unspecified: Secondary | ICD-10-CM | POA: Diagnosis not present

## 2017-07-28 DIAGNOSIS — I272 Pulmonary hypertension, unspecified: Secondary | ICD-10-CM | POA: Diagnosis not present

## 2017-07-28 DIAGNOSIS — L97221 Non-pressure chronic ulcer of left calf limited to breakdown of skin: Secondary | ICD-10-CM | POA: Diagnosis not present

## 2017-07-28 DIAGNOSIS — L97211 Non-pressure chronic ulcer of right calf limited to breakdown of skin: Secondary | ICD-10-CM | POA: Diagnosis not present

## 2017-07-28 DIAGNOSIS — I872 Venous insufficiency (chronic) (peripheral): Secondary | ICD-10-CM | POA: Diagnosis not present

## 2017-07-30 ENCOUNTER — Ambulatory Visit (INDEPENDENT_AMBULATORY_CARE_PROVIDER_SITE_OTHER): Payer: Medicare Other | Admitting: Pharmacist Clinician (PhC)/ Clinical Pharmacy Specialist

## 2017-07-30 DIAGNOSIS — I272 Pulmonary hypertension, unspecified: Secondary | ICD-10-CM | POA: Diagnosis not present

## 2017-07-30 DIAGNOSIS — I099 Rheumatic heart disease, unspecified: Secondary | ICD-10-CM | POA: Diagnosis not present

## 2017-07-30 DIAGNOSIS — L97211 Non-pressure chronic ulcer of right calf limited to breakdown of skin: Secondary | ICD-10-CM | POA: Diagnosis not present

## 2017-07-30 DIAGNOSIS — Z952 Presence of prosthetic heart valve: Secondary | ICD-10-CM

## 2017-07-30 DIAGNOSIS — D696 Thrombocytopenia, unspecified: Secondary | ICD-10-CM | POA: Diagnosis not present

## 2017-07-30 DIAGNOSIS — I872 Venous insufficiency (chronic) (peripheral): Secondary | ICD-10-CM | POA: Diagnosis not present

## 2017-07-30 DIAGNOSIS — Z5181 Encounter for therapeutic drug level monitoring: Secondary | ICD-10-CM

## 2017-07-30 DIAGNOSIS — L97221 Non-pressure chronic ulcer of left calf limited to breakdown of skin: Secondary | ICD-10-CM | POA: Diagnosis not present

## 2017-07-30 LAB — POCT INR: INR: 2.4

## 2017-08-02 DIAGNOSIS — L97221 Non-pressure chronic ulcer of left calf limited to breakdown of skin: Secondary | ICD-10-CM | POA: Diagnosis not present

## 2017-08-02 DIAGNOSIS — L97211 Non-pressure chronic ulcer of right calf limited to breakdown of skin: Secondary | ICD-10-CM | POA: Diagnosis not present

## 2017-08-02 DIAGNOSIS — I099 Rheumatic heart disease, unspecified: Secondary | ICD-10-CM | POA: Diagnosis not present

## 2017-08-02 DIAGNOSIS — I872 Venous insufficiency (chronic) (peripheral): Secondary | ICD-10-CM | POA: Diagnosis not present

## 2017-08-02 DIAGNOSIS — D696 Thrombocytopenia, unspecified: Secondary | ICD-10-CM | POA: Diagnosis not present

## 2017-08-02 DIAGNOSIS — I272 Pulmonary hypertension, unspecified: Secondary | ICD-10-CM | POA: Diagnosis not present

## 2017-08-03 DIAGNOSIS — I272 Pulmonary hypertension, unspecified: Secondary | ICD-10-CM | POA: Diagnosis not present

## 2017-08-03 DIAGNOSIS — L97211 Non-pressure chronic ulcer of right calf limited to breakdown of skin: Secondary | ICD-10-CM | POA: Diagnosis not present

## 2017-08-03 DIAGNOSIS — I099 Rheumatic heart disease, unspecified: Secondary | ICD-10-CM | POA: Diagnosis not present

## 2017-08-03 DIAGNOSIS — L03116 Cellulitis of left lower limb: Secondary | ICD-10-CM | POA: Diagnosis not present

## 2017-08-03 DIAGNOSIS — L97221 Non-pressure chronic ulcer of left calf limited to breakdown of skin: Secondary | ICD-10-CM | POA: Diagnosis not present

## 2017-08-03 DIAGNOSIS — D696 Thrombocytopenia, unspecified: Secondary | ICD-10-CM | POA: Diagnosis not present

## 2017-08-03 DIAGNOSIS — I872 Venous insufficiency (chronic) (peripheral): Secondary | ICD-10-CM | POA: Diagnosis not present

## 2017-08-04 DIAGNOSIS — I099 Rheumatic heart disease, unspecified: Secondary | ICD-10-CM | POA: Diagnosis not present

## 2017-08-04 DIAGNOSIS — D696 Thrombocytopenia, unspecified: Secondary | ICD-10-CM | POA: Diagnosis not present

## 2017-08-04 DIAGNOSIS — L97221 Non-pressure chronic ulcer of left calf limited to breakdown of skin: Secondary | ICD-10-CM | POA: Diagnosis not present

## 2017-08-04 DIAGNOSIS — I272 Pulmonary hypertension, unspecified: Secondary | ICD-10-CM | POA: Diagnosis not present

## 2017-08-04 DIAGNOSIS — I872 Venous insufficiency (chronic) (peripheral): Secondary | ICD-10-CM | POA: Diagnosis not present

## 2017-08-04 DIAGNOSIS — L97211 Non-pressure chronic ulcer of right calf limited to breakdown of skin: Secondary | ICD-10-CM | POA: Diagnosis not present

## 2017-08-05 ENCOUNTER — Telehealth: Payer: Self-pay | Admitting: Pharmacist Clinician (PhC)/ Clinical Pharmacy Specialist

## 2017-08-05 NOTE — Telephone Encounter (Signed)
Received fax from [place] - patient went to urgent care last night, was diagnosed with cellulitis and given Bactrim DS BID x 10 days.    Patient on warfarin and currently having problems with INR spiking to > 8 with no known causes (yet).  She is being evaluated for liver issues by GI at this time.  Advised patient to hold warfarin for 2 days then take normal doses on Sunday and Monday.  She has an appointment with Dr. Felipa Eth on Tuesday, so will ask their office to draw INR and send [place] results.  Patient has difficulty getting around, and has husband whom she also cares for.

## 2017-08-06 NOTE — Telephone Encounter (Signed)
Spoke with patient.   Now on cephalexin instead of Bactrim.  Advised patient to re-start Saturday at normal dose.  Will still be checked by Dr. Felipa Eth on Tuesday.

## 2017-08-06 NOTE — Telephone Encounter (Signed)
Spoke with Brittney - will switch to keflex 500 mg tid x 10 days.

## 2017-08-09 DIAGNOSIS — I872 Venous insufficiency (chronic) (peripheral): Secondary | ICD-10-CM | POA: Diagnosis not present

## 2017-08-09 DIAGNOSIS — L97221 Non-pressure chronic ulcer of left calf limited to breakdown of skin: Secondary | ICD-10-CM | POA: Diagnosis not present

## 2017-08-09 DIAGNOSIS — I272 Pulmonary hypertension, unspecified: Secondary | ICD-10-CM | POA: Diagnosis not present

## 2017-08-09 DIAGNOSIS — I480 Paroxysmal atrial fibrillation: Secondary | ICD-10-CM | POA: Diagnosis not present

## 2017-08-09 DIAGNOSIS — L97211 Non-pressure chronic ulcer of right calf limited to breakdown of skin: Secondary | ICD-10-CM | POA: Diagnosis not present

## 2017-08-09 DIAGNOSIS — I099 Rheumatic heart disease, unspecified: Secondary | ICD-10-CM | POA: Diagnosis not present

## 2017-08-09 DIAGNOSIS — R748 Abnormal levels of other serum enzymes: Secondary | ICD-10-CM | POA: Diagnosis not present

## 2017-08-09 DIAGNOSIS — Z79899 Other long term (current) drug therapy: Secondary | ICD-10-CM | POA: Diagnosis not present

## 2017-08-09 DIAGNOSIS — D696 Thrombocytopenia, unspecified: Secondary | ICD-10-CM | POA: Diagnosis not present

## 2017-08-11 ENCOUNTER — Telehealth: Payer: Self-pay | Admitting: Pharmacist Clinician (PhC)/ Clinical Pharmacy Specialist

## 2017-08-11 NOTE — Telephone Encounter (Signed)
Reviewed notes from Dr. Paulita Fujita at [place], patient has potential congestive hepatopathy from significant heart disease.  This has caused her INR's to fluctuate from 1.5 to > 10 in the past several weeks.     Will cut her dose today down to 5 mg daily except 2.5 mg MWF.   Re-scheduled for Tuesday.

## 2017-08-12 ENCOUNTER — Telehealth: Payer: Self-pay | Admitting: Cardiology

## 2017-08-12 DIAGNOSIS — I099 Rheumatic heart disease, unspecified: Secondary | ICD-10-CM | POA: Diagnosis not present

## 2017-08-12 DIAGNOSIS — I872 Venous insufficiency (chronic) (peripheral): Secondary | ICD-10-CM | POA: Diagnosis not present

## 2017-08-12 DIAGNOSIS — D696 Thrombocytopenia, unspecified: Secondary | ICD-10-CM | POA: Diagnosis not present

## 2017-08-12 DIAGNOSIS — L97211 Non-pressure chronic ulcer of right calf limited to breakdown of skin: Secondary | ICD-10-CM | POA: Diagnosis not present

## 2017-08-12 DIAGNOSIS — L97221 Non-pressure chronic ulcer of left calf limited to breakdown of skin: Secondary | ICD-10-CM | POA: Diagnosis not present

## 2017-08-12 DIAGNOSIS — I272 Pulmonary hypertension, unspecified: Secondary | ICD-10-CM | POA: Diagnosis not present

## 2017-08-12 NOTE — Telephone Encounter (Signed)
Spoke with [name] Chung, the patient, for the last month has had a lot of problems with swelling in her feet and ankles. She has been working with dr Felipa Eth and they are wrapping her legs. The patients liver function is elevated by [name] Chung's report. [name] Chung reports dr Felipa Eth wants the patient to get an echo because they feel the liver abnormality and the edema maybe from her heart. They requested a sooner appt with dr Martinique and report that dr Felipa Eth was going to call and talk with dr Martinique. Follow up scheduled, will make dr Martinique aware.

## 2017-08-12 NOTE — Telephone Encounter (Signed)
New message  Patient daughter  Lorriane Shire calling with concerns of swelling in both legs. Offered sooner appt with PA - daughter declined. Requesting order for echo. Please call    Pt c/o swelling: STAT is pt has developed SOB within 24 hours  1) How much weight have you gained and in what time span? In 1 month has gained about 10-12lbs  2) If swelling, where is the swelling located? Both legs  3) Are you currently taking a fluid pill? yes  4) Are you currently SOB? No  5) Do you have a log of your daily weights (if so, list) 142lbs  on 11/7, 143lbs on Tuesday  6) Have you gained 3 pounds in a day or 5 pounds in a week? Daughter unsure  7) Have you traveled recently? No

## 2017-08-12 NOTE — Telephone Encounter (Signed)
New Message    Please call Dr Felipa Eth on Monday he would like to speak with you.

## 2017-08-13 NOTE — Telephone Encounter (Signed)
I spoke with Dr. Felipa Eth. She has an appointment to see me next Tuesday  Early Ord Martinique MD, [facility]

## 2017-08-16 NOTE — Progress Notes (Signed)
Cardiology Office Note    Date:  08/17/2017   ID:  THEODORE RAHRIG, DOB 12-01-1932, MRN [id number]  PCP:  Lajean Manes, MD  Cardiologist:  Peter Martinique, MD    History of Present Illness:  [name] Chung is a 83 y.o. female with a history of Rheumatic heart disease. She is seen for evaluation of worsening lower extremity edema. She underwent redo open heart surgery in December 2012 at [facility]. This included redo aortic valve replacement with a Medtronic freestyle aortic root conduit. She also underwent tricuspid valve annuloplasty and repair. The mitral valve replacement was unchanged. She had initial surgery for rheumatic heart disease in 1982 with both aortic and mitral valve replacements with Bjork Shiley valves. She had repeat surgery in 1995 with a homograft aortic valve and a St. Jude mechanical mitral valve replacement. She is on chronic Coumadin.  She was seen recently by Dr. Felipa Eth with findings of marked lower extremity edema and elevated LFTs. She reports this started in September and has progressed.  Evaluated by Dr Paulita Fujita who felt she may have congestive hepatopathy. Given diuretics and legs wrapped but step daughter states there has been little improvement. Initially weight at home declined from 145>141 lbs but increased to 143 lbs today. She notes symptoms of orthopnea. She feels full in her lower abdomen. No dizziness or syncope. No palpitations. States her legs were hot and red and she was treated with Keflex for cellulitis. She is getting ready to move to [place]. Currently she has nursing care throughout the day.   Past Medical History:  Diagnosis Date  . Aortic insufficiency   . Arthritis   . Chronic anticoagulation   . Chronic atrial fibrillation ([place])   . Edema   . Rheumatic heart disease   . Valvular heart disease     Past Surgical History:  Procedure Laterality Date  . ABDOMINAL HYSTERECTOMY    . AORTIC VALVE REPLACEMENT     1982, redo 1995  homograft, redo 2012 Medronic valve conduit-freestyle  . BACK SURGERY    . CATARACT EXTRACTION W/ INTRAOCULAR LENS  IMPLANT, BILATERAL    . MITRAL VALVE REPLACEMENT     1982, redo 1995 St Jude  valve    Current Medications: Outpatient Medications Prior to Visit  Medication Sig Dispense Refill  . digoxin (LANOXIN) 0.125 MG tablet Take 1 tablet (0.125 mg total) by mouth daily. 90 tablet 2  . furosemide (LASIX) 20 MG tablet Take 40 mg 2 (two) times daily by mouth.    . hydrochlorothiazide (HYDRODIURIL) 50 MG tablet Take 50 mg by mouth daily.    . hydroxypropyl methylcellulose / hypromellose (ISOPTO TEARS / GONIOVISC) 2.5 % ophthalmic solution Place 1 drop into both eyes at bedtime.    . potassium chloride (K-DUR) 10 MEQ tablet Take 1 tablet (10 mEq total) by mouth 2 (two) times daily. 180 tablet 3  . Propylene Glycol 0.6 % SOLN Place 1 drop into both eyes 2 (two) times daily.    Marland Kitchen warfarin (COUMADIN) 5 MG tablet Take 1-2 tablets by mouth daily as directed by coumadin clinic 135 tablet 1  . butalbital-acetaminophen-caffeine (FIORICET, ESGIC) 50-325-40 MG per tablet Take 1 tablet by mouth 2 (two) times daily as needed for headache or migraine.     . furosemide (LASIX) 20 MG tablet Take 1 tablet (20 mg total) by mouth daily. 90 tablet 2  . LORazepam (ATIVAN) 1 MG tablet Take 1 mg by mouth daily as needed for anxiety.     Marland Kitchen  temazepam (RESTORIL) 30 MG capsule Take 30 mg by mouth daily.      No facility-administered medications prior to visit.      Allergies:   Asa buff (mag [buffered aspirin]; Codeine; Mirtazapine; and Ultram [tramadol hcl]   Social History   Socioeconomic History  . Marital status: Married    Spouse name: None  . Number of children: 0  . Years of education: None  . Highest education level: None  Social Needs  . Financial resource strain: None  . Food insecurity - worry: None  . Food insecurity - inability: None  . Transportation needs - medical: None  .  Transportation needs - non-medical: None  Occupational History  . 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 Smoking status: Never Smoker  . Smokeless tobacco: Never Used  Substance and Sexual Activity  . Alcohol use: No  . Drug use: No  . Sexual activity: No  Other Topics Concern  . None  Social History Narrative  . None     Family History:  The patient's family history includes Diabetes in her mother; Heart attack in her mother; Other in her father; Stroke in her mother.   ROS:   Please see the history of present illness.    ROS All other systems reviewed and are negative.   PHYSICAL EXAM:   VS:  BP (!) 115/52   Pulse 100   Ht 5' 5.5" (1.664 m)   Wt 143 lb (64.9 kg)   BMI 23.43 kg/m    GENERAL:  Elderly WF in NAD HEENT:  PERRL, EOMI, sclera are clear. Oropharynx is clear. NECK:  + jugular venous distention, carotid upstroke brisk and symmetric, no bruits, no thyromegaly or adenopathy LUNGS:  Clear to auscultation bilaterally CHEST:  Unremarkable HEART:  IRRR,  PMI not displaced or sustained, good mechanical AV and MV clicks.  no S3, no S4:  no rubs, no murmurs ABD:  Soft, nontender. BS +, no masses or bruits. No hepatomegaly, no splenomegaly EXT:  2 + pulses throughout, 3+  Edema with weeping, no cyanosis no clubbing. No redness. SKIN:  Warm and dry.  No rashes NEURO:  Alert and oriented x 3. Cranial nerves II through XII intact. PSYCH:  Cognitively intact    Wt Readings from Last 3 Encounters:  08/17/17 143 lb (64.9 kg)  03/23/17 137 lb 9.6 oz (62.4 kg)  02/16/17 137 lb (62.1 kg)      Studies/Labs Reviewed:   EKG:  EKG is not ordered today.     Recent Labs: No results found for requested labs within last 8760 hours.   Lipid Panel No results found for: CHOL, TRIG, HDL, CHOLHDL, VLDL, LDLCALC, LDLDIRECT  Additional studies/ records that were reviewed today include:  Labs dated 01/13/17: cholesterol 236, triglycerides  171, HDL 74, LDL 127.  03/10/17: CMET normal. Dated 07/26/17 Hgb 10.2. Dated 08/09/17: ALT 67. BMET normal. Creatinine 0.91    CLINICAL DATA:  Elevated transaminase level, localized edema  EXAM: CT ABDOMEN AND PELVIS WITH CONTRAST  TECHNIQUE: Multidetector CT imaging of the abdomen and pelvis was performed using the standard protocol following bolus administration of intravenous contrast.  CONTRAST:  100 cc Isovue-300  COMPARISON:  None.  FINDINGS: Lower chest: There is a moderate size right pleural effusion present with resultant partial atelectasis of the right lower lobe. Partial atelectasis of the right middle lobe also is noted. The left lung base is clear. The heart is  mildly enlarged. No pericardial effusion is seen.  Hepatobiliary: There is a low-attenuation structure near the dome of the right lobe of liver on image 17 measuring 9 mm in diameter. Smaller low-attenuation structures are noted in the caudal right lobe peripherally as well all which appear most typical of a benign process such as slightly complex cysts. The gallbladder is well seen with very minimal distension, but multiple gallstones are present within the gallbladder. The gallbladder wall is not thickened and the common bile duct does not appear to be distended.  Pancreas: The pancreas is moderately well seen with no abnormality noted. The pancreatic duct is not dilated.  Spleen: The spleen is within normal limits in size.  Adrenals/Urinary Tract: The adrenal glands are unremarkable. The kidneys enhance with no calculus or mass. No hydronephrosis is seen. On delayed images, no abnormality is noted. The ureters appear to be normal in caliber. The urinary bladder is not well distended and therefore is difficult to assess. No gross abnormality is seen.  Stomach/Bowel: The stomach is largely decompressed and unremarkable. No small bowel distention is seen. No abnormality of the colon is seen.  The terminal ileum opacifies the appendix is visualized in the right lower quadrant and measures 8 mm in diameter. No surrounding evidence of inflammation is seen but clinical correlation is recommended to exclude developing appendicitis.  Vascular/Lymphatic: Moderate abdominal aortic atherosclerosis is noted. No adenopathy is seen.  Reproductive: The uterus has previously been resected. No adnexal lesion is seen. No fluid is noted within the pelvis.  Other: None.  Musculoskeletal: The lumbar vertebrae are normal alignment with degenerative change within the facet joints of the lower lumbar spine.  IMPRESSION: 1. Multiple gallstones within slightly distended gallbladder. No gallbladder wall thickening is seen. Correlate clinically. 2. Moderate size right pleural effusion with partial atelectasis of the right lower and right middle lobes. 3. Slight prominence of the appendix but no local inflammatory change is seen. Again correlate clinically to exclude developing acute appendicitis. 4. Moderate abdominal aortic atherosclerosis.   Electronically Signed   By: Ivar Drape M.D.   On: 07/20/2017 15:01   ASSESSMENT:    1. Acute on chronic diastolic CHF (congestive heart failure) ([place])   2. S/P AVR (aortic valve replacement)   3. S/P MVR (mitral valve replacement)   4. Long term (current) use of anticoagulants   5. Chronic atrial fibrillation (H[facility])      PLAN:  In order of problems listed above:   1. Valvular heart disease status post redo aortic valve replacement x3 and redo mitral valve surgery x2. Valve function is stable by exam. Continue on coumadin. With recent abnormal liver function INR has been fluctuating significantly. Pharmacy to adjust today. SBE prophylaxis. Her last surgery was in 2012 and last Echo in 2015.  2. Chronic anticoagulation. Continue coumadin. Check INR today.  3.  Atrial fibrillation. Chronic. Rate controlled.  Continue dig and coumadin.     4. Acute on chronic diastolic CHF. Suspect this is the primary cause of her marked edema. Possible pulmonary HTN. No ascites noted on CT. Will update Echo. Sodium restriction recommendations reviewed. Will continue lasix 40 mg bid and add metolazone 2.5 mg daily. Elevate feet when possible. Continue leg wraps. Follow up in one week and will check CMET and dig level then. Will also check UA today given feeling of pelvic fullness.    Medication Adjustments/Labs and Tests Ordered: Current medicines are reviewed at length with the patient today.  Concerns regarding medicines are  outlined above.  Medication changes, Labs and Tests ordered today are listed in the Patient Instructions below. Patient Instructions  Continue lasix 40 mg twice a day  We will check a urinalysis  We will add metolazone 2.5 mg daily  Keep feet elevated as much as possible.  Restrict salt intake.  We will schedule you for an Echocardiogram  I will see  You in one week with lab work      Signed, Peter Martinique, MD  08/17/2017 4:24 PM    [facility] [address], [place], [place], [address] [phone]

## 2017-08-17 ENCOUNTER — Ambulatory Visit (INDEPENDENT_AMBULATORY_CARE_PROVIDER_SITE_OTHER): Payer: Medicare Other | Admitting: Pharmacist Clinician (PhC)/ Clinical Pharmacy Specialist

## 2017-08-17 ENCOUNTER — Encounter: Payer: Self-pay | Admitting: Cardiology

## 2017-08-17 ENCOUNTER — Ambulatory Visit (INDEPENDENT_AMBULATORY_CARE_PROVIDER_SITE_OTHER): Payer: Medicare Other | Admitting: Cardiology

## 2017-08-17 VITALS — BP 115/52 | HR 100 | Ht 65.5 in | Wt 143.0 lb

## 2017-08-17 DIAGNOSIS — I872 Venous insufficiency (chronic) (peripheral): Secondary | ICD-10-CM | POA: Diagnosis not present

## 2017-08-17 DIAGNOSIS — I099 Rheumatic heart disease, unspecified: Secondary | ICD-10-CM | POA: Diagnosis not present

## 2017-08-17 DIAGNOSIS — I5033 Acute on chronic diastolic (congestive) heart failure: Secondary | ICD-10-CM | POA: Diagnosis not present

## 2017-08-17 DIAGNOSIS — L97211 Non-pressure chronic ulcer of right calf limited to breakdown of skin: Secondary | ICD-10-CM | POA: Diagnosis not present

## 2017-08-17 DIAGNOSIS — I482 Chronic atrial fibrillation, unspecified: Secondary | ICD-10-CM

## 2017-08-17 DIAGNOSIS — Z952 Presence of prosthetic heart valve: Secondary | ICD-10-CM

## 2017-08-17 DIAGNOSIS — Z5181 Encounter for therapeutic drug level monitoring: Secondary | ICD-10-CM

## 2017-08-17 DIAGNOSIS — Z7901 Long term (current) use of anticoagulants: Secondary | ICD-10-CM | POA: Diagnosis not present

## 2017-08-17 DIAGNOSIS — L97221 Non-pressure chronic ulcer of left calf limited to breakdown of skin: Secondary | ICD-10-CM | POA: Diagnosis not present

## 2017-08-17 DIAGNOSIS — D696 Thrombocytopenia, unspecified: Secondary | ICD-10-CM | POA: Diagnosis not present

## 2017-08-17 DIAGNOSIS — I272 Pulmonary hypertension, unspecified: Secondary | ICD-10-CM | POA: Diagnosis not present

## 2017-08-17 LAB — POCT INR: INR: 3.5

## 2017-08-17 MED ORDER — METOLAZONE 2.5 MG PO TABS: 2.5000 mg | ORAL_TABLET | Freq: Every day | ORAL | 3 refills | Status: DC

## 2017-08-17 NOTE — Patient Instructions (Signed)
Continue lasix 40 mg twice a day  We will check a urinalysis  We will add metolazone 2.5 mg daily  Keep feet elevated as much as possible.  Restrict salt intake.  We will schedule you for an Echocardiogram  I will see  You in one week with lab work

## 2017-08-18 LAB — URINALYSIS
BILIRUBIN UA: NEGATIVE
GLUCOSE, UA: NEGATIVE
Ketones, UA: NEGATIVE
Leukocytes, UA: NEGATIVE
Nitrite, UA: NEGATIVE
PH UA: 6.5 (ref 5.0–7.5)
Protein, UA: NEGATIVE
RBC, UA: NEGATIVE
Specific Gravity, UA: 1.009 (ref 1.005–1.030)
Urobilinogen, Ur: 0.2 mg/dL (ref 0.2–1.0)

## 2017-08-19 ENCOUNTER — Ambulatory Visit (HOSPITAL_COMMUNITY): Payer: Medicare Other | Attending: Cardiovascular Disease

## 2017-08-19 ENCOUNTER — Other Ambulatory Visit: Payer: Self-pay

## 2017-08-19 DIAGNOSIS — I509 Heart failure, unspecified: Secondary | ICD-10-CM | POA: Diagnosis not present

## 2017-08-19 DIAGNOSIS — I5033 Acute on chronic diastolic (congestive) heart failure: Secondary | ICD-10-CM | POA: Insufficient documentation

## 2017-08-19 DIAGNOSIS — I482 Chronic atrial fibrillation, unspecified: Secondary | ICD-10-CM

## 2017-08-19 DIAGNOSIS — Z7901 Long term (current) use of anticoagulants: Secondary | ICD-10-CM | POA: Diagnosis not present

## 2017-08-19 DIAGNOSIS — I4891 Unspecified atrial fibrillation: Secondary | ICD-10-CM | POA: Diagnosis not present

## 2017-08-19 DIAGNOSIS — Z952 Presence of prosthetic heart valve: Secondary | ICD-10-CM | POA: Insufficient documentation

## 2017-08-21 NOTE — Progress Notes (Addendum)
Cardiology Office Note    Date:  08/24/2017   ID:  Chung, [name] 05/12/1933, MRN [id number]  PCP:  Lajean Manes, MD  Cardiologist:  Erva Koke Martinique, MD    History of Present Illness:  [name] Chung is a 83 y.o. female with a history of Rheumatic heart disease. She is seen for follow up of  lower extremity edema and CHF. She underwent redo open heart surgery in December 2012 at [facility]. This included redo aortic valve replacement with a Medtronic freestyle aortic root conduit. She also underwent tricuspid valve annuloplasty and repair. The mitral valve replacement was unchanged. She had initial surgery for rheumatic heart disease in 1982 with both aortic and mitral valve replacements with Bjork Shiley valves. She had repeat surgery in 1995 with a homograft aortic valve and a St. Jude mechanical mitral valve replacement. She is on chronic Coumadin.  She was seen recently by Dr. Felipa Eth with findings of marked lower extremity edema and elevated LFTs. She reports this started in September and has progressed.  Evaluated by Dr Paulita Fujita who felt she may have congestive hepatopathy. Given diuretics and legs wrapped but step daughter states there has been little improvement. Initially weight at home declined from 145>141 lbs but increased to 143 lbs. On her last visit we continue lasix 40 mg bid and added metolazone 2.5 mg daily.   In the past week the patient has moved to [place]. Her step daughter notes she initially lost 3 lbs but since then weight stable. She is not sure mother has been taking medication since she fills her pill box and noticed her mother had an old pill box out. Legs are still markedly swollen and oozing. Also reports home health has not been out to wrap legs since Friday. Also, patient complains of weakness on left side. This is not a new complaint and daughter reports that family has noted a cognitive decline. Planning to see Neurology at [facility].    Past  Medical History:  Diagnosis Date  . Aortic insufficiency   . Arthritis   . Chronic anticoagulation   . Chronic atrial fibrillation ([place])   . Edema   . Rheumatic heart disease   . Valvular heart disease     Past Surgical History:  Procedure Laterality Date  . ABDOMINAL HYSTERECTOMY    . AORTIC VALVE REPLACEMENT     1982, redo 1995 homograft, redo 2012 Medronic valve conduit-freestyle  . BACK SURGERY    . CATARACT EXTRACTION W/ INTRAOCULAR LENS  IMPLANT, BILATERAL    . MITRAL VALVE REPLACEMENT     1982, redo 1995 St Jude  valve    Current Medications: Outpatient Medications Prior to Visit  Medication Sig Dispense Refill  . digoxin (LANOXIN) 0.125 MG tablet Take 1 tablet (0.125 mg total) by mouth daily. 90 tablet 2  . furosemide (LASIX) 20 MG tablet Take 40 mg 2 (two) times daily by mouth.    . hydrochlorothiazide (HYDRODIURIL) 50 MG tablet Take 50 mg by mouth daily.    . hydroxypropyl methylcellulose / hypromellose (ISOPTO TEARS / GONIOVISC) 2.5 % ophthalmic solution Place 1 drop into both eyes at bedtime.    . metolazone (ZAROXOLYN) 2.5 MG tablet Take 1 tablet (2.5 mg total) daily by mouth. 90 tablet 3  . potassium chloride (K-DUR) 10 MEQ tablet Take 1 tablet (10 mEq total) by mouth 2 (two) times daily. 180 tablet 3  . Propylene Glycol 0.6 % SOLN Place 1 drop into both eyes 2 (  two) times daily.    Marland Kitchen warfarin (COUMADIN) 5 MG tablet Take 1-2 tablets by mouth daily as directed by coumadin clinic 135 tablet 1   No facility-administered medications prior to visit.      Allergies:   Asa buff (mag [buffered aspirin]; Codeine; Mirtazapine; and Ultram [tramadol hcl]   Social History   Socioeconomic History  . Marital status: Married    Spouse name: None  . Number of children: 0  . Years of education: None  . Highest education level: None  Social Needs  . Financial resource strain: None  . Food insecurity - worry: None  . Food insecurity - inability: None  . Transportation  needs - medical: None  . Transportation needs - non-medical: None  Occupational History  . 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 Smoking status: Never Smoker  . Smokeless tobacco: Never Used  Substance and Sexual Activity  . Alcohol use: No  . Drug use: No  . Sexual activity: No  Other Topics Concern  . None  Social History Narrative  . None     Family History:  The patient's family history includes Diabetes in her mother; Heart attack in her mother; Other in her father; Stroke in her mother.   ROS:   Please see the history of present illness.    ROS All other systems reviewed and are negative.   PHYSICAL EXAM:   VS:  BP (!) 120/58   Pulse 86   Ht 5' 5.5" (1.664 m)   Wt 143 lb 9.6 oz (65.1 kg)   BMI 23.53 kg/m    GENERAL:  Elderly WF in NAD HEENT:  PERRL, EOMI, sclera are clear. Oropharynx is clear. NECK:  no jugular venous distention, carotid upstroke brisk and symmetric, no bruits, no thyromegaly or adenopathy LUNGS:  Clear to auscultation bilaterally CHEST:  Unremarkable HEART:  IRRR,  PMI not displaced or sustained, good mechanical AV and MV clicks.  no S3, no S4:  no rubs, no murmurs ABD:  Soft, nontender. BS +, no masses or bruits. No hepatomegaly, no splenomegaly EXT:  2 + pulses throughout, 3+  Edema with weeping, no cyanosis no clubbing. No redness. SKIN:  Warm and dry.  No rashes NEURO:  Alert and oriented x 3. Cranial nerves II through XII intact. PSYCH:  Cognitively intact    Wt Readings from Last 3 Encounters:  08/24/17 143 lb 9.6 oz (65.1 kg)  08/17/17 143 lb (64.9 kg)  03/23/17 137 lb 9.6 oz (62.4 kg)      Studies/Labs Reviewed:   EKG:  EKG is not ordered today.     Recent Labs: No results found for requested labs within last 8760 hours.   Lipid Panel No results found for: CHOL, TRIG, HDL, CHOLHDL, VLDL, LDLCALC, LDLDIRECT  Additional studies/ records that were reviewed today include:  Labs dated  01/13/17: cholesterol 236, triglycerides 171, HDL 74, LDL 127.  03/10/17: CMET normal. Dated 07/26/17 Hgb 10.2. Dated 08/09/17: ALT 67. BMET normal. Creatinine 0.91    CLINICAL DATA:  Elevated transaminase level, localized edema  EXAM: CT ABDOMEN AND PELVIS WITH CONTRAST  TECHNIQUE: Multidetector CT imaging of the abdomen and pelvis was performed using the standard protocol following bolus administration of intravenous contrast.  CONTRAST:  100 cc Isovue-300  COMPARISON:  None.  FINDINGS: Lower chest: There is a moderate size right pleural effusion present with resultant partial atelectasis of the right lower lobe. Partial atelectasis of  the right middle lobe also is noted. The left lung base is clear. The heart is mildly enlarged. No pericardial effusion is seen.  Hepatobiliary: There is a low-attenuation structure near the dome of the right lobe of liver on image 17 measuring 9 mm in diameter. Smaller low-attenuation structures are noted in the caudal right lobe peripherally as well all which appear most typical of a benign process such as slightly complex cysts. The gallbladder is well seen with very minimal distension, but multiple gallstones are present within the gallbladder. The gallbladder wall is not thickened and the common bile duct does not appear to be distended.  Pancreas: The pancreas is moderately well seen with no abnormality noted. The pancreatic duct is not dilated.  Spleen: The spleen is within normal limits in size.  Adrenals/Urinary Tract: The adrenal glands are unremarkable. The kidneys enhance with no calculus or mass. No hydronephrosis is seen. On delayed images, no abnormality is noted. The ureters appear to be normal in caliber. The urinary bladder is not well distended and therefore is difficult to assess. No gross abnormality is seen.  Stomach/Bowel: The stomach is largely decompressed and unremarkable. No small bowel distention is  seen. No abnormality of the colon is seen. The terminal ileum opacifies the appendix is visualized in the right lower quadrant and measures 8 mm in diameter. No surrounding evidence of inflammation is seen but clinical correlation is recommended to exclude developing appendicitis.  Vascular/Lymphatic: Moderate abdominal aortic atherosclerosis is noted. No adenopathy is seen.  Reproductive: The uterus has previously been resected. No adnexal lesion is seen. No fluid is noted within the pelvis.  Other: None.  Musculoskeletal: The lumbar vertebrae are normal alignment with degenerative change within the facet joints of the lower lumbar spine.  IMPRESSION: 1. Multiple gallstones within slightly distended gallbladder. No gallbladder wall thickening is seen. Correlate clinically. 2. Moderate size right pleural effusion with partial atelectasis of the right lower and right middle lobes. 3. Slight prominence of the appendix but no local inflammatory change is seen. Again correlate clinically to exclude developing acute appendicitis. 4. Moderate abdominal aortic atherosclerosis.   Electronically Signed   By: Ivar Drape M.D.   On: 07/20/2017 15:01   ASSESSMENT:    No diagnosis found.   PLAN:  In order of problems listed above:   1. Valvular heart disease status post redo aortic valve replacement x3 and redo mitral valve surgery x2. Valve function is stable by exam. Continue on coumadin. With recent abnormal liver function INR has been fluctuating significantly. Pharmacy to adjust today. SBE prophylaxis. Her last surgery was in 2012. She did have an Echo last week. Report still pending in Epic. Her prosthetic valve function appeared to be good. Normal LV and RV function. Probably mild pulmonary HTN. No effusion. The IVC is dilated.   2. Chronic anticoagulation. Continue coumadin. Check INR today.  3.  Atrial fibrillation. Chronic. Rate controlled.  Continue dig and  coumadin.   4. Marked LE edema.  I am discouraged by her poor response to more intensive diuretic therapy. This may be related to social factors and daughter is going to make sure she is getting pills appropriately. Will await results of lab work today to see if we can titrate diuretics further. Also check Dig level. Her Echo looked surprisingly good so I am not sure this is all CHF.  No ascites noted on CT but venous structures appear dilated.  Sodium restriction recommendations reviewed. Will continue lasix 40 mg bid and  metolazone 2.5 mg daily until we get labs back. Elevate feet when possible. Continue leg wraps. Follow up December 12. Discussed possibility of hospitalization if she fails outpatient therapy but daughter states this would be a last resort.    Medication Adjustments/Labs and Tests Ordered: Current medicines are reviewed at length with the patient today.  Concerns regarding medicines are outlined above.  Medication changes, Labs and Tests ordered today are listed in the Patient Instructions below. There are no Patient Instructions on file for this visit.   Signed, Deeann Servidio Martinique, MD  08/24/2017 12:01 PM    [facility] [address], [place], [place], [address] [phone]  Addendum: labs returned today: glucose 217, BUN 29, creatinine 1.14. Potassium 2.4. CO2 32. Albumin 2.8. Total bili 2.4, alk phos 118, AST 102, ALT 50.   Recommend increasing potassium to 40 meq twice a day. Reduce metolazone to 2.5 mg every other day. Reduce digoxin to 0.0625 mg daily. Will repeat BMET in one week.  Lurena Naeve Martinique MD, [facility]  08/24/2017 4:24 PM

## 2017-08-24 ENCOUNTER — Encounter: Payer: Self-pay | Admitting: Cardiology

## 2017-08-24 ENCOUNTER — Ambulatory Visit (INDEPENDENT_AMBULATORY_CARE_PROVIDER_SITE_OTHER): Payer: Medicare Other | Admitting: Pharmacist Clinician (PhC)/ Clinical Pharmacy Specialist

## 2017-08-24 ENCOUNTER — Telehealth: Payer: Self-pay | Admitting: Pharmacist Clinician (PhC)/ Clinical Pharmacy Specialist

## 2017-08-24 ENCOUNTER — Ambulatory Visit (INDEPENDENT_AMBULATORY_CARE_PROVIDER_SITE_OTHER): Payer: Medicare Other | Admitting: Cardiology

## 2017-08-24 ENCOUNTER — Telehealth: Payer: Self-pay | Admitting: *Deleted

## 2017-08-24 VITALS — BP 120/58 | HR 86 | Ht 65.5 in | Wt 143.6 lb

## 2017-08-24 DIAGNOSIS — Z5181 Encounter for therapeutic drug level monitoring: Secondary | ICD-10-CM

## 2017-08-24 DIAGNOSIS — I099 Rheumatic heart disease, unspecified: Secondary | ICD-10-CM | POA: Diagnosis not present

## 2017-08-24 DIAGNOSIS — I482 Chronic atrial fibrillation, unspecified: Secondary | ICD-10-CM

## 2017-08-24 DIAGNOSIS — Z952 Presence of prosthetic heart valve: Secondary | ICD-10-CM

## 2017-08-24 DIAGNOSIS — Z7901 Long term (current) use of anticoagulants: Secondary | ICD-10-CM

## 2017-08-24 DIAGNOSIS — L97211 Non-pressure chronic ulcer of right calf limited to breakdown of skin: Secondary | ICD-10-CM | POA: Diagnosis not present

## 2017-08-24 DIAGNOSIS — I5033 Acute on chronic diastolic (congestive) heart failure: Secondary | ICD-10-CM

## 2017-08-24 DIAGNOSIS — I272 Pulmonary hypertension, unspecified: Secondary | ICD-10-CM | POA: Diagnosis not present

## 2017-08-24 DIAGNOSIS — D696 Thrombocytopenia, unspecified: Secondary | ICD-10-CM | POA: Diagnosis not present

## 2017-08-24 DIAGNOSIS — L97221 Non-pressure chronic ulcer of left calf limited to breakdown of skin: Secondary | ICD-10-CM | POA: Diagnosis not present

## 2017-08-24 DIAGNOSIS — E876 Hypokalemia: Secondary | ICD-10-CM

## 2017-08-24 DIAGNOSIS — I872 Venous insufficiency (chronic) (peripheral): Secondary | ICD-10-CM | POA: Diagnosis not present

## 2017-08-24 LAB — COMPREHENSIVE METABOLIC PANEL
ALT: 50 IU/L — AB (ref 0–32)
AST: 102 IU/L — AB (ref 0–40)
Albumin/Globulin Ratio: 0.7 — ABNORMAL LOW (ref 1.2–2.2)
Albumin: 2.8 g/dL — ABNORMAL LOW (ref 3.5–4.7)
Alkaline Phosphatase: 118 IU/L — ABNORMAL HIGH (ref 39–117)
BUN/Creatinine Ratio: 25 (ref 12–28)
BUN: 29 mg/dL — AB (ref 8–27)
Bilirubin Total: 2.4 mg/dL — ABNORMAL HIGH (ref 0.0–1.2)
CALCIUM: 8.1 mg/dL — AB (ref 8.7–10.3)
CO2: 32 mmol/L — AB (ref 20–29)
CREATININE: 1.14 mg/dL — AB (ref 0.57–1.00)
Chloride: 93 mmol/L — ABNORMAL LOW (ref 96–106)
GFR calc Af Amer: 52 mL/min/{1.73_m2} — ABNORMAL LOW (ref >59)
GFR, EST NON AFRICAN AMERICAN: 45 mL/min/{1.73_m2} — AB (ref >59)
GLUCOSE: 217 mg/dL — AB (ref 65–99)
Globulin, Total: 4.1 g/dL (ref 1.5–4.5)
Potassium: 2.4 mmol/L — CL (ref 3.5–5.2)
Sodium: 142 mmol/L (ref 134–144)
Total Protein: 6.9 g/dL (ref 6.0–8.5)

## 2017-08-24 LAB — DIGOXIN LEVEL: DIGOXIN, SERUM: 0.7 ng/mL (ref 0.5–0.9)

## 2017-08-24 LAB — POCT INR: INR: 4.4

## 2017-08-24 NOTE — Progress Notes (Signed)
Updated calendar to correct dosing

## 2017-08-24 NOTE — Telephone Encounter (Signed)
Updated anticoagulation calendar to reflect correct dose.  Lorriane Shire also wanted to let Dr. Martinique know that patient has been taking her new diuretic daily as prescribed

## 2017-08-24 NOTE — Telephone Encounter (Signed)
[name] Chung calling,   States that the print-out that she received is incorrect. The document reflects the current coumadin dosage not the new dosage.. Please f/u

## 2017-08-24 NOTE — Telephone Encounter (Signed)
Spoke to [place] advised patient needs to decrease Lanoxin to 0.0625 mg daily,decrease Metolazone to 2.5 mg every other day,increase Kdur to 40 meq twice a day.Repeat bmet in 1 week.Lab order placed in lab box.Copy of lab faxed to Dr.Outlaw at fax # [phone].

## 2017-08-24 NOTE — Telephone Encounter (Signed)
Left message on personal voice mail for patient's step daughter Lorriane Shire to call me back about patient's labs.

## 2017-08-24 NOTE — Telephone Encounter (Signed)
CALLED CRITICAL LAB VALUE--   POTASSIUM 2.4  COMPLETE LAB RESULT IS BEING FAXED.  RECEIVED RESULTS -  GIVEN  CHERYL FOR DR Martinique TO  REVIEW

## 2017-08-24 NOTE — Telephone Encounter (Signed)
Follow up    Calling back for results. Please call again. Daughter states she has a meeting at [employer].

## 2017-08-25 DIAGNOSIS — K7460 Unspecified cirrhosis of liver: Secondary | ICD-10-CM | POA: Diagnosis not present

## 2017-08-25 DIAGNOSIS — K7290 Hepatic failure, unspecified without coma: Secondary | ICD-10-CM | POA: Diagnosis not present

## 2017-08-25 DIAGNOSIS — D649 Anemia, unspecified: Secondary | ICD-10-CM | POA: Diagnosis not present

## 2017-08-25 DIAGNOSIS — R110 Nausea: Secondary | ICD-10-CM | POA: Diagnosis not present

## 2017-08-25 DIAGNOSIS — R600 Localized edema: Secondary | ICD-10-CM | POA: Diagnosis not present

## 2017-08-25 DIAGNOSIS — K8020 Calculus of gallbladder without cholecystitis without obstruction: Secondary | ICD-10-CM | POA: Diagnosis not present

## 2017-08-30 DIAGNOSIS — D696 Thrombocytopenia, unspecified: Secondary | ICD-10-CM | POA: Diagnosis not present

## 2017-08-30 DIAGNOSIS — L97221 Non-pressure chronic ulcer of left calf limited to breakdown of skin: Secondary | ICD-10-CM | POA: Diagnosis not present

## 2017-08-30 DIAGNOSIS — I872 Venous insufficiency (chronic) (peripheral): Secondary | ICD-10-CM | POA: Diagnosis not present

## 2017-08-30 DIAGNOSIS — L97211 Non-pressure chronic ulcer of right calf limited to breakdown of skin: Secondary | ICD-10-CM | POA: Diagnosis not present

## 2017-08-30 DIAGNOSIS — I272 Pulmonary hypertension, unspecified: Secondary | ICD-10-CM | POA: Diagnosis not present

## 2017-08-30 DIAGNOSIS — I099 Rheumatic heart disease, unspecified: Secondary | ICD-10-CM | POA: Diagnosis not present

## 2017-08-31 ENCOUNTER — Telehealth: Payer: Self-pay | Admitting: Cardiology

## 2017-08-31 ENCOUNTER — Ambulatory Visit (INDEPENDENT_AMBULATORY_CARE_PROVIDER_SITE_OTHER): Payer: Medicare Other | Admitting: Pharmacist

## 2017-08-31 DIAGNOSIS — Z5181 Encounter for therapeutic drug level monitoring: Secondary | ICD-10-CM | POA: Diagnosis not present

## 2017-08-31 LAB — POCT INR: INR: 2.4

## 2017-08-31 NOTE — Telephone Encounter (Signed)
[name] Chung is calling to find out what day [name] Chung labs need to drawn again next week. Please call

## 2017-08-31 NOTE — Telephone Encounter (Signed)
LM for [name] Chung that based on lab results from 08/24/17, patient should have repeat BMET done 11/28, 11/29 or 11/30

## 2017-09-01 ENCOUNTER — Encounter: Payer: Self-pay | Admitting: Neurology

## 2017-09-01 ENCOUNTER — Telehealth: Payer: Self-pay | Admitting: Cardiology

## 2017-09-01 DIAGNOSIS — L97221 Non-pressure chronic ulcer of left calf limited to breakdown of skin: Secondary | ICD-10-CM | POA: Diagnosis not present

## 2017-09-01 DIAGNOSIS — D696 Thrombocytopenia, unspecified: Secondary | ICD-10-CM | POA: Diagnosis not present

## 2017-09-01 DIAGNOSIS — I872 Venous insufficiency (chronic) (peripheral): Secondary | ICD-10-CM | POA: Diagnosis not present

## 2017-09-01 DIAGNOSIS — I099 Rheumatic heart disease, unspecified: Secondary | ICD-10-CM | POA: Diagnosis not present

## 2017-09-01 DIAGNOSIS — I272 Pulmonary hypertension, unspecified: Secondary | ICD-10-CM | POA: Diagnosis not present

## 2017-09-01 DIAGNOSIS — L97211 Non-pressure chronic ulcer of right calf limited to breakdown of skin: Secondary | ICD-10-CM | POA: Diagnosis not present

## 2017-09-01 NOTE — Telephone Encounter (Signed)
Spoke w daughter. She was under the impression bloodwork was drawn yesterday - reviewed chart, it does look like patient came in for coumadin check but the BMET was not done. I explained the difference and apologized for the error on our part. She was understanding. States it is difficult for her to get the patient and patient's husband to appointments but that she would contact [facility] to see if their nurse can come to house and do bloodwork. Will call back if so. Otherwise, she'll plan to bring patient tomorrow or Friday (as recommended by Dr. Martinique) for the BMET recheck following most recent med changes.

## 2017-09-01 NOTE — Telephone Encounter (Signed)
Patient's daughter returned call, gave number for [facility] - stated they can do order. I called [place] [phone], spoke to [facility] [employer] who gave me fax # of [phone] to send order.  BMET faxed.   Involved parties aware to call back if further needs.

## 2017-09-01 NOTE — Telephone Encounter (Signed)
New message    Patient returning call from 11/27.  Please call

## 2017-09-02 ENCOUNTER — Telehealth: Payer: Self-pay | Admitting: Cardiology

## 2017-09-02 DIAGNOSIS — D696 Thrombocytopenia, unspecified: Secondary | ICD-10-CM | POA: Diagnosis not present

## 2017-09-02 DIAGNOSIS — I272 Pulmonary hypertension, unspecified: Secondary | ICD-10-CM | POA: Diagnosis not present

## 2017-09-02 DIAGNOSIS — I099 Rheumatic heart disease, unspecified: Secondary | ICD-10-CM | POA: Diagnosis not present

## 2017-09-02 DIAGNOSIS — L97211 Non-pressure chronic ulcer of right calf limited to breakdown of skin: Secondary | ICD-10-CM | POA: Diagnosis not present

## 2017-09-02 DIAGNOSIS — L97221 Non-pressure chronic ulcer of left calf limited to breakdown of skin: Secondary | ICD-10-CM | POA: Diagnosis not present

## 2017-09-02 DIAGNOSIS — I482 Chronic atrial fibrillation: Secondary | ICD-10-CM | POA: Diagnosis not present

## 2017-09-02 DIAGNOSIS — I872 Venous insufficiency (chronic) (peripheral): Secondary | ICD-10-CM | POA: Diagnosis not present

## 2017-09-02 NOTE — Telephone Encounter (Signed)
Please call,she said the nurse yesterday was supposed to send an order for her to have lab work at [facility] to [employer].Marland Kitchen

## 2017-09-02 NOTE — Telephone Encounter (Signed)
Per prev Tele message Theodore Demark, RN :Frances Furbish who gave me fax # of [phone] to send order.  Informed daughter of above. She states that she call this morning and they stated that they did bot receive.  I have re-faxed orders, as above

## 2017-09-03 DIAGNOSIS — I872 Venous insufficiency (chronic) (peripheral): Secondary | ICD-10-CM | POA: Diagnosis not present

## 2017-09-03 DIAGNOSIS — L97211 Non-pressure chronic ulcer of right calf limited to breakdown of skin: Secondary | ICD-10-CM | POA: Diagnosis not present

## 2017-09-03 DIAGNOSIS — I272 Pulmonary hypertension, unspecified: Secondary | ICD-10-CM | POA: Diagnosis not present

## 2017-09-03 DIAGNOSIS — L97221 Non-pressure chronic ulcer of left calf limited to breakdown of skin: Secondary | ICD-10-CM | POA: Diagnosis not present

## 2017-09-03 DIAGNOSIS — D696 Thrombocytopenia, unspecified: Secondary | ICD-10-CM | POA: Diagnosis not present

## 2017-09-03 DIAGNOSIS — I099 Rheumatic heart disease, unspecified: Secondary | ICD-10-CM | POA: Diagnosis not present

## 2017-09-07 ENCOUNTER — Ambulatory Visit (INDEPENDENT_AMBULATORY_CARE_PROVIDER_SITE_OTHER): Payer: Medicare Other | Admitting: Pharmacist

## 2017-09-07 DIAGNOSIS — Z5181 Encounter for therapeutic drug level monitoring: Secondary | ICD-10-CM | POA: Diagnosis not present

## 2017-09-07 DIAGNOSIS — I272 Pulmonary hypertension, unspecified: Secondary | ICD-10-CM | POA: Diagnosis not present

## 2017-09-07 DIAGNOSIS — L97211 Non-pressure chronic ulcer of right calf limited to breakdown of skin: Secondary | ICD-10-CM | POA: Diagnosis not present

## 2017-09-07 DIAGNOSIS — L97221 Non-pressure chronic ulcer of left calf limited to breakdown of skin: Secondary | ICD-10-CM | POA: Diagnosis not present

## 2017-09-07 DIAGNOSIS — I872 Venous insufficiency (chronic) (peripheral): Secondary | ICD-10-CM | POA: Diagnosis not present

## 2017-09-07 DIAGNOSIS — D696 Thrombocytopenia, unspecified: Secondary | ICD-10-CM | POA: Diagnosis not present

## 2017-09-07 DIAGNOSIS — I099 Rheumatic heart disease, unspecified: Secondary | ICD-10-CM | POA: Diagnosis not present

## 2017-09-07 LAB — POCT INR: INR: 1.6

## 2017-09-08 DIAGNOSIS — L97211 Non-pressure chronic ulcer of right calf limited to breakdown of skin: Secondary | ICD-10-CM | POA: Diagnosis not present

## 2017-09-08 DIAGNOSIS — I272 Pulmonary hypertension, unspecified: Secondary | ICD-10-CM | POA: Diagnosis not present

## 2017-09-08 DIAGNOSIS — I872 Venous insufficiency (chronic) (peripheral): Secondary | ICD-10-CM | POA: Diagnosis not present

## 2017-09-08 DIAGNOSIS — L97221 Non-pressure chronic ulcer of left calf limited to breakdown of skin: Secondary | ICD-10-CM | POA: Diagnosis not present

## 2017-09-08 DIAGNOSIS — I099 Rheumatic heart disease, unspecified: Secondary | ICD-10-CM | POA: Diagnosis not present

## 2017-09-08 DIAGNOSIS — D696 Thrombocytopenia, unspecified: Secondary | ICD-10-CM | POA: Diagnosis not present

## 2017-09-09 DIAGNOSIS — I272 Pulmonary hypertension, unspecified: Secondary | ICD-10-CM | POA: Diagnosis not present

## 2017-09-09 DIAGNOSIS — L97221 Non-pressure chronic ulcer of left calf limited to breakdown of skin: Secondary | ICD-10-CM | POA: Diagnosis not present

## 2017-09-09 DIAGNOSIS — L97211 Non-pressure chronic ulcer of right calf limited to breakdown of skin: Secondary | ICD-10-CM | POA: Diagnosis not present

## 2017-09-09 DIAGNOSIS — I872 Venous insufficiency (chronic) (peripheral): Secondary | ICD-10-CM | POA: Diagnosis not present

## 2017-09-09 DIAGNOSIS — I099 Rheumatic heart disease, unspecified: Secondary | ICD-10-CM | POA: Diagnosis not present

## 2017-09-09 DIAGNOSIS — D696 Thrombocytopenia, unspecified: Secondary | ICD-10-CM | POA: Diagnosis not present

## 2017-09-10 ENCOUNTER — Telehealth: Payer: Self-pay | Admitting: Physician Assistant

## 2017-09-10 DIAGNOSIS — I099 Rheumatic heart disease, unspecified: Secondary | ICD-10-CM | POA: Diagnosis not present

## 2017-09-10 DIAGNOSIS — D696 Thrombocytopenia, unspecified: Secondary | ICD-10-CM | POA: Diagnosis not present

## 2017-09-10 DIAGNOSIS — I272 Pulmonary hypertension, unspecified: Secondary | ICD-10-CM | POA: Diagnosis not present

## 2017-09-10 DIAGNOSIS — L97221 Non-pressure chronic ulcer of left calf limited to breakdown of skin: Secondary | ICD-10-CM | POA: Diagnosis not present

## 2017-09-10 DIAGNOSIS — L97211 Non-pressure chronic ulcer of right calf limited to breakdown of skin: Secondary | ICD-10-CM | POA: Diagnosis not present

## 2017-09-10 DIAGNOSIS — I872 Venous insufficiency (chronic) (peripheral): Secondary | ICD-10-CM | POA: Diagnosis not present

## 2017-09-10 DIAGNOSIS — K7290 Hepatic failure, unspecified without coma: Secondary | ICD-10-CM | POA: Diagnosis not present

## 2017-09-10 DIAGNOSIS — K7460 Unspecified cirrhosis of liver: Secondary | ICD-10-CM | POA: Diagnosis not present

## 2017-09-10 LAB — POCT INR: INR: 2.6

## 2017-09-10 NOTE — Telephone Encounter (Signed)
[facility] [phone]. Patient's INR 2.6.

## 2017-09-13 ENCOUNTER — Other Ambulatory Visit: Payer: Self-pay | Admitting: Cardiology

## 2017-09-13 ENCOUNTER — Ambulatory Visit (INDEPENDENT_AMBULATORY_CARE_PROVIDER_SITE_OTHER): Payer: Medicare Other | Admitting: Pharmacist

## 2017-09-13 DIAGNOSIS — Z5181 Encounter for therapeutic drug level monitoring: Secondary | ICD-10-CM | POA: Diagnosis not present

## 2017-09-13 NOTE — Telephone Encounter (Signed)
See anti-coagulation noted from 09/13/17

## 2017-09-13 NOTE — Telephone Encounter (Signed)
Unable to reach [place] (no answer on phone provided). Talked to patient's daughter. Next INR in office 12/12 at 3:30 during OV with Dr Martinique.

## 2017-09-14 DIAGNOSIS — I272 Pulmonary hypertension, unspecified: Secondary | ICD-10-CM | POA: Diagnosis not present

## 2017-09-14 DIAGNOSIS — L97211 Non-pressure chronic ulcer of right calf limited to breakdown of skin: Secondary | ICD-10-CM | POA: Diagnosis not present

## 2017-09-14 DIAGNOSIS — L97221 Non-pressure chronic ulcer of left calf limited to breakdown of skin: Secondary | ICD-10-CM | POA: Diagnosis not present

## 2017-09-14 DIAGNOSIS — I872 Venous insufficiency (chronic) (peripheral): Secondary | ICD-10-CM | POA: Diagnosis not present

## 2017-09-14 DIAGNOSIS — D696 Thrombocytopenia, unspecified: Secondary | ICD-10-CM | POA: Diagnosis not present

## 2017-09-14 DIAGNOSIS — I099 Rheumatic heart disease, unspecified: Secondary | ICD-10-CM | POA: Diagnosis not present

## 2017-09-14 NOTE — Progress Notes (Signed)
Cardiology Office Note    Date:  09/15/2017   ID:  [name] Chung, DOB 12/05/1932, MRN [id number]  PCP:  [name] Manes, MD  Cardiologist:  [name] Trostel Martinique, MD    History of Present Illness:  [name] Chung is a 83 y.o. female with a history of Rheumatic heart disease. She is seen for follow up of  lower extremity edema and CHF. She underwent redo open heart surgery in December 2012 at [facility]. This included redo aortic valve replacement with a Medtronic freestyle aortic root conduit. She also underwent tricuspid valve annuloplasty and repair. The mitral valve replacement was unchanged. She had initial surgery for rheumatic heart disease in 1982 with both aortic and mitral valve replacements with Bjork Shiley valves. She had repeat surgery in 1995 with a homograft aortic valve and a St. Jude mechanical mitral valve replacement. She is on chronic Coumadin.  She was seen recently by Dr. Felipa Chung with findings of marked lower extremity edema and elevated LFTs. She reports this started in September and has progressed.  Evaluated by Dr [name] Chung who felt she may have congestive hepatopathy. Given diuretics and legs wrapped but step daughter states there has been little improvement. Initially weight at home declined from 145>141 lbs but increased to 143 lbs. On her last visit we continue lasix 40 mg bid and added metolazone 2.5 mg daily. She initially lost 3 lbs but since then weight was stable.  Legs were still markedly swollen and oozing. Her BMET showed increase in creatinine and very low potassium. Her metolazone was reduced to every other day and potassium dose increased.   Today she is seen with her grand daughter. She is doing much better. GI added lactulose. Swelling is down. She has lost 8 lbs. She denies dyspnea. Family notes she is much brighter. She is now living at [facility].    Past Medical History:  Diagnosis Date  . Aortic insufficiency   . Arthritis   . Chronic  anticoagulation   . Chronic atrial fibrillation ([place])   . Edema   . Rheumatic heart disease   . Valvular heart disease     Past Surgical History:  Procedure Laterality Date  . ABDOMINAL HYSTERECTOMY    . AORTIC VALVE REPLACEMENT     1982, redo 1995 homograft, redo 2012 Medronic valve conduit-freestyle  . BACK SURGERY    . CATARACT EXTRACTION W/ INTRAOCULAR LENS  IMPLANT, BILATERAL    . MITRAL VALVE REPLACEMENT     1982, redo 1995 St Jude  valve    Current Medications: Outpatient Medications Prior to Visit  Medication Sig Dispense Refill  . digoxin (LANOXIN) 0.125 MG tablet Take 1/2 tablet ( 0.0625 mg ) daily 90 tablet 3  . furosemide (LASIX) 20 MG tablet Take 40 mg 2 (two) times daily by mouth.    . hydrochlorothiazide (HYDRODIURIL) 50 MG tablet Take 50 mg by mouth daily.    . hydroxypropyl methylcellulose / hypromellose (ISOPTO TEARS / GONIOVISC) 2.5 % ophthalmic solution Place 1 drop into both eyes at bedtime.    . potassium chloride (K-DUR) 10 MEQ tablet Take 4 tablets ( 40 meq ) twice a day 90 tablet 3  . Propylene Glycol 0.6 % SOLN Place 1 drop into both eyes 2 (two) times daily.    Marland Kitchen warfarin (COUMADIN) 5 MG tablet TAKE 1 TO 2 TABLETS EVERY DAY AS DIRECTED BY COUMADIN CLINIC 180 tablet 1  . metolazone (ZAROXOLYN) 2.5 MG tablet Take 2.5 mg by mouth every 3 (three)  days. Take 2.5 mg every third day 90 tablet 3   No facility-administered medications prior to visit.      Allergies:   Asa buff (mag [buffered aspirin]; Codeine; Mirtazapine; and Ultram [tramadol hcl]   Social History   Socioeconomic History  . Marital status: Married    Spouse name: None  . Number of children: 0  . Years of education: None  . Highest education level: None  Social Needs  . Financial resource strain: None  . Food insecurity - worry: None  . Food insecurity - inability: None  . Transportation needs - medical: None  . Transportation needs - non-medical: None  Occupational History  .  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 Smoking status: Never Smoker  . Smokeless tobacco: Never Used  Substance and Sexual Activity  . Alcohol use: No  . Drug use: No  . Sexual activity: No  Other Topics Concern  . None  Social History Narrative  . None     Family History:  The patient's family history includes Diabetes in her mother; Heart attack in her mother; Other in her father; Stroke in her mother.   ROS:   Please see the history of present illness.    ROS All other systems reviewed and are negative.   PHYSICAL EXAM:   VS:  BP 116/61   Pulse 87   Ht 5' 5.5" (1.664 m)   Wt 135 lb 9.6 oz (61.5 kg)   BMI 22.22 kg/m    GENERAL:  Elderly WF in NAD HEENT:  PERRL, EOMI, sclera are clear. Oropharynx is clear. NECK:  no jugular venous distention, carotid upstroke brisk and symmetric, no bruits, no thyromegaly or adenopathy LUNGS:  Clear to auscultation bilaterally CHEST:  Unremarkable HEART:  IRRR,  PMI not displaced or sustained, good mechanical AV and MV clicks.  no S3, no S4:  no rubs, no murmurs ABD:  Soft, nontender. BS +, no masses or bruits. No hepatomegaly, no splenomegaly EXT:  2 + pulses throughout, trace  Edema with no skin breakdown. Support stockings in place. No redness. SKIN:  Warm and dry.  No rashes NEURO:  Alert and oriented x 3. Cranial nerves II through XII intact. PSYCH:  Cognitively intact    Wt Readings from Last 3 Encounters:  09/15/17 135 lb 9.6 oz (61.5 kg)  08/24/17 143 lb 9.6 oz (65.1 kg)  08/17/17 143 lb (64.9 kg)      Studies/Labs Reviewed:   EKG:  EKG is not ordered today.     Recent Labs: 08/24/2017: ALT 50; BUN 29; Creatinine, Ser 1.14; Potassium 2.4; Sodium 142   Lipid Panel No results found for: CHOL, TRIG, HDL, CHOLHDL, VLDL, LDLCALC, LDLDIRECT  Additional studies/ records that were reviewed today include:  Labs dated 01/13/17: cholesterol 236, triglycerides 171, HDL 74, LDL 127.   03/10/17: CMET normal. Dated 07/26/17 Hgb 10.2. Dated 08/09/17: ALT 67. BMET normal. Creatinine 0.91    CLINICAL DATA:  Elevated transaminase level, localized edema  EXAM: CT ABDOMEN AND PELVIS WITH CONTRAST  TECHNIQUE: Multidetector CT imaging of the abdomen and pelvis was performed using the standard protocol following bolus administration of intravenous contrast.  CONTRAST:  100 cc Isovue-300  COMPARISON:  None.  FINDINGS: Lower chest: There is a moderate size right pleural effusion present with resultant partial atelectasis of the right lower lobe. Partial atelectasis of the right middle lobe also is noted. The left lung base is clear.  The heart is mildly enlarged. No pericardial effusion is seen.  Hepatobiliary: There is a low-attenuation structure near the dome of the right lobe of liver on image 17 measuring 9 mm in diameter. Smaller low-attenuation structures are noted in the caudal right lobe peripherally as well all which appear most typical of a benign process such as slightly complex cysts. The gallbladder is well seen with very minimal distension, but multiple gallstones are present within the gallbladder. The gallbladder wall is not thickened and the common bile duct does not appear to be distended.  Pancreas: The pancreas is moderately well seen with no abnormality noted. The pancreatic duct is not dilated.  Spleen: The spleen is within normal limits in size.  Adrenals/Urinary Tract: The adrenal glands are unremarkable. The kidneys enhance with no calculus or mass. No hydronephrosis is seen. On delayed images, no abnormality is noted. The ureters appear to be normal in caliber. The urinary bladder is not well distended and therefore is difficult to assess. No gross abnormality is seen.  Stomach/Bowel: The stomach is largely decompressed and unremarkable. No small bowel distention is seen. No abnormality of the colon is seen. The terminal ileum  opacifies the appendix is visualized in the right lower quadrant and measures 8 mm in diameter. No surrounding evidence of inflammation is seen but clinical correlation is recommended to exclude developing appendicitis.  Vascular/Lymphatic: Moderate abdominal aortic atherosclerosis is noted. No adenopathy is seen.  Reproductive: The uterus has previously been resected. No adnexal lesion is seen. No fluid is noted within the pelvis.  Other: None.  Musculoskeletal: The lumbar vertebrae are normal alignment with degenerative change within the facet joints of the lower lumbar spine.  IMPRESSION: 1. Multiple gallstones within slightly distended gallbladder. No gallbladder wall thickening is seen. Correlate clinically. 2. Moderate size right pleural effusion with partial atelectasis of the right lower and right middle lobes. 3. Slight prominence of the appendix but no local inflammatory change is seen. Again correlate clinically to exclude developing acute appendicitis. 4. Moderate abdominal aortic atherosclerosis.   Electronically Signed   By: Ivar Drape M.D.   On: 07/20/2017 15:01   ASSESSMENT:    1. Acute on chronic diastolic CHF (congestive heart failure) ([place])   2. S/P AVR (aortic valve replacement)   3. S/P MVR (mitral valve replacement)   4. Chronic atrial fibrillation (HCC)   5. Long term (current) use of anticoagulants      PLAN:  In order of problems listed above:   1. Valvular heart disease status post redo aortic valve replacement x3 and redo mitral valve surgery x2. Valve function is stable by exam. Continue on coumadin. With recent abnormal liver function INR has been fluctuating significantly. Pharmacy to adjust today. SBE prophylaxis. Her last surgery was in 2012. She did have an Echo done in November. Report is still pending.  Her prosthetic valve function appeared to be good. Normal LV and RV function. Probably mild pulmonary HTN. No effusion. The IVC  is dilated.   2. Chronic anticoagulation. Continue coumadin. Check INR today.  3.  Atrial fibrillation. Chronic. Rate controlled.  Continue dig and coumadin.   4. Marked LE edema. This is markedly improved today. Will reduce metolazone to 2.5 mg every third day. Check BMET today. OK to wear compression hose. Continue sodium restriction. I will follow up in 6 weeks.    Medication Adjustments/Labs and Tests Ordered: Current medicines are reviewed at length with the patient today.  Concerns regarding medicines are outlined above.  Medication changes, Labs and Tests ordered today are listed in the Patient Instructions below. Patient Instructions  Reduce metolazone (zaroxolyn) to every third day.  We will check chemistries today and INR  Continue your other therapy  I will see you in 6 weeks      Signed, Virgil Slinger Martinique, MD  09/15/2017 3:47 PM    [facility] [address] [place], [place], [address] [phone]

## 2017-09-15 ENCOUNTER — Ambulatory Visit (INDEPENDENT_AMBULATORY_CARE_PROVIDER_SITE_OTHER): Payer: Medicare Other | Admitting: Cardiology

## 2017-09-15 ENCOUNTER — Encounter: Payer: Self-pay | Admitting: Cardiology

## 2017-09-15 ENCOUNTER — Ambulatory Visit (INDEPENDENT_AMBULATORY_CARE_PROVIDER_SITE_OTHER): Payer: Medicare Other | Admitting: Pharmacist Clinician (PhC)/ Clinical Pharmacy Specialist

## 2017-09-15 VITALS — BP 116/61 | HR 87 | Ht 65.5 in | Wt 135.6 lb

## 2017-09-15 DIAGNOSIS — Z7901 Long term (current) use of anticoagulants: Secondary | ICD-10-CM | POA: Diagnosis not present

## 2017-09-15 DIAGNOSIS — Z5181 Encounter for therapeutic drug level monitoring: Secondary | ICD-10-CM

## 2017-09-15 DIAGNOSIS — Z952 Presence of prosthetic heart valve: Secondary | ICD-10-CM

## 2017-09-15 DIAGNOSIS — I5033 Acute on chronic diastolic (congestive) heart failure: Secondary | ICD-10-CM | POA: Diagnosis not present

## 2017-09-15 DIAGNOSIS — I482 Chronic atrial fibrillation, unspecified: Secondary | ICD-10-CM

## 2017-09-15 LAB — POCT INR: INR: 3.5

## 2017-09-15 MED ORDER — METOLAZONE 2.5 MG PO TABS: 2.5000 mg | ORAL_TABLET | ORAL | 3 refills | Status: DC

## 2017-09-15 NOTE — Patient Instructions (Signed)
Decrease warfarin to 5mg  every Sunday, 2.5mg  all other days of the week.  Repeat INR Thursday Dec 20 or Friday Dec 21.

## 2017-09-15 NOTE — Addendum Note (Signed)
Addended by: Kathyrn Lass on: 09/15/2017 04:00 PM   Modules accepted: Orders

## 2017-09-15 NOTE — Patient Instructions (Signed)
Reduce metolazone (zaroxolyn) to every third day.  We will check chemistries today and INR  Continue your other therapy  I will see you in 6 weeks

## 2017-09-16 ENCOUNTER — Telehealth: Payer: Self-pay | Admitting: *Deleted

## 2017-09-16 DIAGNOSIS — Z5181 Encounter for therapeutic drug level monitoring: Secondary | ICD-10-CM

## 2017-09-16 DIAGNOSIS — Z79899 Other long term (current) drug therapy: Secondary | ICD-10-CM

## 2017-09-16 DIAGNOSIS — Z952 Presence of prosthetic heart valve: Secondary | ICD-10-CM

## 2017-09-16 DIAGNOSIS — I482 Chronic atrial fibrillation, unspecified: Secondary | ICD-10-CM

## 2017-09-16 LAB — BASIC METABOLIC PANEL
BUN / CREAT RATIO: 20 (ref 12–28)
BUN: 26 mg/dL (ref 8–27)
CHLORIDE: 98 mmol/L (ref 96–106)
CO2: 30 mmol/L — AB (ref 20–29)
Calcium: 8.1 mg/dL — ABNORMAL LOW (ref 8.7–10.3)
Creatinine, Ser: 1.31 mg/dL — ABNORMAL HIGH (ref 0.57–1.00)
GFR calc Af Amer: 44 mL/min/{1.73_m2} — ABNORMAL LOW (ref >59)
GFR calc non Af Amer: 38 mL/min/{1.73_m2} — ABNORMAL LOW (ref >59)
GLUCOSE: 134 mg/dL — AB (ref 65–99)
Potassium: 3.4 mmol/L — ABNORMAL LOW (ref 3.5–5.2)
SODIUM: 141 mmol/L (ref 134–144)

## 2017-09-16 NOTE — Telephone Encounter (Signed)
LEFT DETAILED MESSAGE FOR [name] OF INSTRUCTION FROM DR Martinique ANY QUESTION MAY CALL BACK

## 2017-09-16 NOTE — Telephone Encounter (Signed)
Spoke to patient's stepdaughter- vanessa Result given . Verbalized understanding [name] Chung states patient has been taking potassium 40 meq  three times a day.    [name] Chung would like clarification whether Dr Martinique was aware and if he still wants to reduce potassium to 40 meq twice a day. vanessa is aware will defer Dr Martinique and contact her back. Lab placed in computer

## 2017-09-17 DIAGNOSIS — L97221 Non-pressure chronic ulcer of left calf limited to breakdown of skin: Secondary | ICD-10-CM | POA: Diagnosis not present

## 2017-09-17 DIAGNOSIS — D696 Thrombocytopenia, unspecified: Secondary | ICD-10-CM | POA: Diagnosis not present

## 2017-09-17 DIAGNOSIS — I872 Venous insufficiency (chronic) (peripheral): Secondary | ICD-10-CM | POA: Diagnosis not present

## 2017-09-17 DIAGNOSIS — L97211 Non-pressure chronic ulcer of right calf limited to breakdown of skin: Secondary | ICD-10-CM | POA: Diagnosis not present

## 2017-09-17 DIAGNOSIS — I099 Rheumatic heart disease, unspecified: Secondary | ICD-10-CM | POA: Diagnosis not present

## 2017-09-17 DIAGNOSIS — I272 Pulmonary hypertension, unspecified: Secondary | ICD-10-CM | POA: Diagnosis not present

## 2017-09-20 DIAGNOSIS — D696 Thrombocytopenia, unspecified: Secondary | ICD-10-CM | POA: Diagnosis not present

## 2017-09-20 DIAGNOSIS — L97211 Non-pressure chronic ulcer of right calf limited to breakdown of skin: Secondary | ICD-10-CM | POA: Diagnosis not present

## 2017-09-20 DIAGNOSIS — I272 Pulmonary hypertension, unspecified: Secondary | ICD-10-CM | POA: Diagnosis not present

## 2017-09-20 DIAGNOSIS — I099 Rheumatic heart disease, unspecified: Secondary | ICD-10-CM | POA: Diagnosis not present

## 2017-09-20 DIAGNOSIS — I872 Venous insufficiency (chronic) (peripheral): Secondary | ICD-10-CM | POA: Diagnosis not present

## 2017-09-20 DIAGNOSIS — L97221 Non-pressure chronic ulcer of left calf limited to breakdown of skin: Secondary | ICD-10-CM | POA: Diagnosis not present

## 2017-09-21 ENCOUNTER — Telehealth: Payer: Self-pay | Admitting: Cardiology

## 2017-09-21 ENCOUNTER — Ambulatory Visit (INDEPENDENT_AMBULATORY_CARE_PROVIDER_SITE_OTHER): Payer: Medicare Other | Admitting: Pharmacist

## 2017-09-21 DIAGNOSIS — Z5181 Encounter for therapeutic drug level monitoring: Secondary | ICD-10-CM | POA: Diagnosis not present

## 2017-09-21 LAB — POCT INR: INR: 2.9

## 2017-09-21 MED ORDER — POTASSIUM CHLORIDE ER 10 MEQ PO TBCR: EXTENDED_RELEASE_TABLET | ORAL | 3 refills | Status: DC

## 2017-09-21 NOTE — Telephone Encounter (Signed)
Returned call to  [employer]) she will bring pt in for blood draw on Thursday. Refills sent as requested.

## 2017-09-21 NOTE — Telephone Encounter (Signed)
°  New Prob  States Dr. Martinique placed orders for blood work. [facility] went to assisted living to draw blood however, they were unable to draw the blood work. Calling to see if pt should come to office to have blood work done. Please call.

## 2017-09-21 NOTE — Telephone Encounter (Signed)
Did not need this encounter °

## 2017-09-22 DIAGNOSIS — Z7901 Long term (current) use of anticoagulants: Secondary | ICD-10-CM | POA: Diagnosis not present

## 2017-09-22 DIAGNOSIS — I272 Pulmonary hypertension, unspecified: Secondary | ICD-10-CM | POA: Diagnosis not present

## 2017-09-22 DIAGNOSIS — E785 Hyperlipidemia, unspecified: Secondary | ICD-10-CM | POA: Diagnosis not present

## 2017-09-22 DIAGNOSIS — I099 Rheumatic heart disease, unspecified: Secondary | ICD-10-CM | POA: Diagnosis not present

## 2017-09-22 DIAGNOSIS — M1990 Unspecified osteoarthritis, unspecified site: Secondary | ICD-10-CM | POA: Diagnosis not present

## 2017-09-22 DIAGNOSIS — I872 Venous insufficiency (chronic) (peripheral): Secondary | ICD-10-CM | POA: Diagnosis not present

## 2017-09-22 DIAGNOSIS — D696 Thrombocytopenia, unspecified: Secondary | ICD-10-CM | POA: Diagnosis not present

## 2017-09-22 DIAGNOSIS — Z952 Presence of prosthetic heart valve: Secondary | ICD-10-CM | POA: Diagnosis not present

## 2017-09-22 DIAGNOSIS — I482 Chronic atrial fibrillation: Secondary | ICD-10-CM | POA: Diagnosis not present

## 2017-09-23 DIAGNOSIS — Z5181 Encounter for therapeutic drug level monitoring: Secondary | ICD-10-CM | POA: Diagnosis not present

## 2017-09-23 DIAGNOSIS — I5030 Unspecified diastolic (congestive) heart failure: Secondary | ICD-10-CM | POA: Diagnosis not present

## 2017-09-23 DIAGNOSIS — Z952 Presence of prosthetic heart valve: Secondary | ICD-10-CM | POA: Diagnosis not present

## 2017-09-23 DIAGNOSIS — I482 Chronic atrial fibrillation: Secondary | ICD-10-CM | POA: Diagnosis not present

## 2017-09-23 DIAGNOSIS — Z79899 Other long term (current) drug therapy: Secondary | ICD-10-CM | POA: Diagnosis not present

## 2017-09-24 ENCOUNTER — Telehealth: Payer: Self-pay | Admitting: Cardiology

## 2017-09-24 ENCOUNTER — Other Ambulatory Visit: Payer: Self-pay

## 2017-09-24 DIAGNOSIS — M1990 Unspecified osteoarthritis, unspecified site: Secondary | ICD-10-CM | POA: Diagnosis not present

## 2017-09-24 DIAGNOSIS — I272 Pulmonary hypertension, unspecified: Secondary | ICD-10-CM | POA: Diagnosis not present

## 2017-09-24 DIAGNOSIS — D696 Thrombocytopenia, unspecified: Secondary | ICD-10-CM | POA: Diagnosis not present

## 2017-09-24 DIAGNOSIS — I099 Rheumatic heart disease, unspecified: Secondary | ICD-10-CM | POA: Diagnosis not present

## 2017-09-24 DIAGNOSIS — E876 Hypokalemia: Secondary | ICD-10-CM

## 2017-09-24 DIAGNOSIS — I482 Chronic atrial fibrillation, unspecified: Secondary | ICD-10-CM

## 2017-09-24 DIAGNOSIS — I872 Venous insufficiency (chronic) (peripheral): Secondary | ICD-10-CM | POA: Diagnosis not present

## 2017-09-24 LAB — BASIC METABOLIC PANEL
BUN/Creatinine Ratio: 15 (ref 12–28)
BUN: 20 mg/dL (ref 8–27)
CHLORIDE: 94 mmol/L — AB (ref 96–106)
CO2: 29 mmol/L (ref 20–29)
CREATININE: 1.34 mg/dL — AB (ref 0.57–1.00)
Calcium: 7.7 mg/dL — ABNORMAL LOW (ref 8.7–10.3)
GFR calc Af Amer: 43 mL/min/{1.73_m2} — ABNORMAL LOW (ref >59)
GFR calc non Af Amer: 37 mL/min/{1.73_m2} — ABNORMAL LOW (ref >59)
GLUCOSE: 150 mg/dL — AB (ref 65–99)
POTASSIUM: 2.9 mmol/L — AB (ref 3.5–5.2)
Sodium: 137 mmol/L (ref 134–144)

## 2017-09-24 NOTE — Telephone Encounter (Signed)
New message     Patty from [facility] calling to request magnesium lab be added onto the labs from 09/23/17. Please call Patty [phone]

## 2017-09-24 NOTE — Telephone Encounter (Signed)
Returned call to [place] with [facility].Magnesium added to labs done yesterday.

## 2017-09-30 ENCOUNTER — Ambulatory Visit (INDEPENDENT_AMBULATORY_CARE_PROVIDER_SITE_OTHER): Payer: Medicare Other | Admitting: Pharmacist Clinician (PhC)/ Clinical Pharmacy Specialist

## 2017-09-30 DIAGNOSIS — I482 Chronic atrial fibrillation, unspecified: Secondary | ICD-10-CM

## 2017-09-30 DIAGNOSIS — Z5181 Encounter for therapeutic drug level monitoring: Secondary | ICD-10-CM

## 2017-09-30 DIAGNOSIS — E876 Hypokalemia: Secondary | ICD-10-CM | POA: Diagnosis not present

## 2017-09-30 LAB — POCT INR: INR: 2.6

## 2017-09-30 NOTE — Patient Instructions (Signed)
Description   Continue taking warfarin to 5mg  every Sunday and Thursday, 2.5mg  all other days of the week.   Repeat INR in about 2 weeks - We will call you once we determine if Home Health will draw or if you will need to come into the office

## 2017-10-01 DIAGNOSIS — I482 Chronic atrial fibrillation: Secondary | ICD-10-CM | POA: Diagnosis not present

## 2017-10-01 DIAGNOSIS — I272 Pulmonary hypertension, unspecified: Secondary | ICD-10-CM | POA: Diagnosis not present

## 2017-10-01 DIAGNOSIS — D696 Thrombocytopenia, unspecified: Secondary | ICD-10-CM | POA: Diagnosis not present

## 2017-10-01 DIAGNOSIS — I872 Venous insufficiency (chronic) (peripheral): Secondary | ICD-10-CM | POA: Diagnosis not present

## 2017-10-01 DIAGNOSIS — I099 Rheumatic heart disease, unspecified: Secondary | ICD-10-CM | POA: Diagnosis not present

## 2017-10-01 DIAGNOSIS — M1990 Unspecified osteoarthritis, unspecified site: Secondary | ICD-10-CM | POA: Diagnosis not present

## 2017-10-01 LAB — BASIC METABOLIC PANEL
BUN/Creatinine Ratio: 18 (ref 12–28)
BUN: 22 mg/dL (ref 8–27)
CALCIUM: 7.9 mg/dL — AB (ref 8.7–10.3)
CO2: 28 mmol/L (ref 20–29)
CREATININE: 1.22 mg/dL — AB (ref 0.57–1.00)
Chloride: 93 mmol/L — ABNORMAL LOW (ref 96–106)
GFR, EST AFRICAN AMERICAN: 48 mL/min/{1.73_m2} — AB (ref >59)
GFR, EST NON AFRICAN AMERICAN: 41 mL/min/{1.73_m2} — AB (ref >59)
Glucose: 165 mg/dL — ABNORMAL HIGH (ref 65–99)
Potassium: 3.4 mmol/L — ABNORMAL LOW (ref 3.5–5.2)
Sodium: 138 mmol/L (ref 134–144)

## 2017-10-12 ENCOUNTER — Ambulatory Visit (INDEPENDENT_AMBULATORY_CARE_PROVIDER_SITE_OTHER): Payer: Medicare Other | Admitting: Pharmacist Clinician (PhC)/ Clinical Pharmacy Specialist

## 2017-10-12 DIAGNOSIS — I482 Chronic atrial fibrillation, unspecified: Secondary | ICD-10-CM

## 2017-10-12 DIAGNOSIS — Z5181 Encounter for therapeutic drug level monitoring: Secondary | ICD-10-CM

## 2017-10-12 LAB — POCT INR: INR: 4.5

## 2017-10-12 LAB — MAGNESIUM: Magnesium: 1.8 mg/dL (ref 1.6–2.3)

## 2017-10-12 LAB — SPECIMEN STATUS REPORT

## 2017-10-18 ENCOUNTER — Other Ambulatory Visit: Payer: Self-pay

## 2017-10-18 MED ORDER — FUROSEMIDE 20 MG PO TABS: 40.0000 mg | ORAL_TABLET | Freq: Two times a day (BID) | ORAL | 3 refills | Status: DC

## 2017-10-18 MED ORDER — HYDROCHLOROTHIAZIDE 50 MG PO TABS: 50.0000 mg | ORAL_TABLET | Freq: Every day | ORAL | 3 refills | Status: DC

## 2017-10-18 MED ORDER — POTASSIUM CHLORIDE ER 10 MEQ PO TBCR: EXTENDED_RELEASE_TABLET | ORAL | 3 refills | Status: DC

## 2017-10-18 MED ORDER — METOLAZONE 2.5 MG PO TABS: ORAL_TABLET | ORAL | 3 refills | Status: DC

## 2017-10-18 MED ORDER — DIGOXIN 125 MCG PO TABS: ORAL_TABLET | ORAL | 3 refills | Status: DC

## 2017-10-20 ENCOUNTER — Ambulatory Visit (INDEPENDENT_AMBULATORY_CARE_PROVIDER_SITE_OTHER): Payer: Medicare Other | Admitting: Pharmacist Clinician (PhC)/ Clinical Pharmacy Specialist

## 2017-10-20 DIAGNOSIS — Z5181 Encounter for therapeutic drug level monitoring: Secondary | ICD-10-CM | POA: Diagnosis not present

## 2017-10-20 DIAGNOSIS — Z952 Presence of prosthetic heart valve: Secondary | ICD-10-CM | POA: Diagnosis not present

## 2017-10-20 LAB — POCT INR: INR: 2.1

## 2017-10-22 ENCOUNTER — Telehealth: Payer: Self-pay | Admitting: Cardiology

## 2017-10-22 NOTE — Telephone Encounter (Signed)
New message   Patients step daughter says that Malachy Mood was suppose to send pt meds to new insurance plan Please call

## 2017-10-22 NOTE — Telephone Encounter (Signed)
Returned the call to the step daughter. She stated that she recently dropped off a copy of a new prescription card for the patient. Her prescriptions need to be sent to [facility] and they need the new card information. Message will be routed to the primary's nurse for her knowledge.

## 2017-10-25 MED ORDER — DIGOXIN 125 MCG PO TABS: ORAL_TABLET | ORAL | 3 refills | Status: DC

## 2017-10-25 MED ORDER — HYDROCHLOROTHIAZIDE 50 MG PO TABS: 50.0000 mg | ORAL_TABLET | Freq: Every day | ORAL | 3 refills | Status: DC

## 2017-10-25 MED ORDER — METOLAZONE 2.5 MG PO TABS: ORAL_TABLET | ORAL | 3 refills | Status: DC

## 2017-10-25 MED ORDER — POTASSIUM CHLORIDE ER 10 MEQ PO TBCR: EXTENDED_RELEASE_TABLET | ORAL | 3 refills | Status: DC

## 2017-10-25 MED ORDER — FUROSEMIDE 20 MG PO TABS: 40.0000 mg | ORAL_TABLET | Freq: Two times a day (BID) | ORAL | 3 refills | Status: DC

## 2017-10-25 NOTE — Telephone Encounter (Signed)
Spoke to patient's step daughter Lorriane Shire.90 day refills sent to [facility].

## 2017-10-26 ENCOUNTER — Other Ambulatory Visit: Payer: Self-pay | Admitting: Pharmacist Clinician (PhC)/ Clinical Pharmacy Specialist

## 2017-10-26 MED ORDER — WARFARIN SODIUM 5 MG PO TABS: ORAL_TABLET | ORAL | 1 refills | Status: DC

## 2017-10-27 ENCOUNTER — Other Ambulatory Visit: Payer: Self-pay

## 2017-10-27 MED ORDER — POTASSIUM CHLORIDE ER 10 MEQ PO TBCR: EXTENDED_RELEASE_TABLET | ORAL | 3 refills | Status: DC

## 2017-10-27 NOTE — Progress Notes (Signed)
Cardiology Office Note    Date:  11/02/2017   ID:  [name], Chung January 11, 1933, MRN [id number]  PCP:  Lajean Manes, MD  Cardiologist:  Milam Allbaugh [place], MD    History of Present Illness:  [name] Chung is a 84 y.o. female with a history of Rheumatic heart disease. She is seen for follow up of  lower extremity edema and CHF. She underwent redo open heart surgery in December 2012 at [facility]. This included redo aortic valve replacement with a Medtronic freestyle aortic root conduit. She also underwent tricuspid valve annuloplasty and repair. The mitral valve replacement was unchanged. She had initial surgery for rheumatic heart disease in 1982 with both aortic and mitral valve replacements with Bjork Shiley valves. She had repeat surgery in 1995 with a homograft aortic valve and a St. Jude mechanical mitral valve replacement. She is on chronic Coumadin.  She was seen the fall of 2018 by Dr. Felipa Eth with findings of marked lower extremity edema and elevated LFTs. She reports this started in September and  progressed.  Evaluated by Dr Paulita Fujita who felt she may have congestive hepatopathy. Given diuretics and legs wrapped but step daughter states there has been little improvement. Initially weight at home declined from 145>141 lbs but increased to 143 lbs. We later  continued lasix 40 mg bid and added metolazone 2.5 mg daily. She had a good response to this with marked reduction of swelling. Potassium was very low and creatinine increased. Metolazone was reduced and potassium increased.   Today she is seen with her grand daughter. She is doing [place]. GI added lactulose. Swelling is increased some. Weight is up 5 lbs. She has been walking the third floor at her facility and did note more SOB last week.  She is now living at [facility]. She recently had multiple skin cancers removed and is planning to have a hemorrhoid banding procedure on Friday.   Past Medical History:  Diagnosis Date  .  Aortic insufficiency   . Arthritis   . Chronic anticoagulation   . Chronic atrial fibrillation ([place])   . Edema   . Rheumatic heart disease   . Valvular heart disease     Past Surgical History:  Procedure Laterality Date  . ABDOMINAL HYSTERECTOMY    . AORTIC VALVE REPLACEMENT     1982, redo 1995 homograft, redo 2012 Medronic valve conduit-freestyle  . BACK SURGERY    . CATARACT EXTRACTION W/ INTRAOCULAR LENS  IMPLANT, BILATERAL    . MITRAL VALVE REPLACEMENT     1982, redo 1995 St Jude  valve    Current Medications: Outpatient Medications Prior to Visit  Medication Sig Dispense Refill  . digoxin (LANOXIN) 0.125 MG tablet Take 1/2 tablet ( 0.0625 mg ) daily 45 tablet 3  . furosemide (LASIX) 20 MG tablet Take 2 tablets (40 mg total) by mouth 2 (two) times daily. 360 tablet 3  . hydrochlorothiazide (HYDRODIURIL) 50 MG tablet Take 1 tablet (50 mg total) by mouth daily. 90 tablet 3  . hydroxypropyl methylcellulose / hypromellose (ISOPTO TEARS / GONIOVISC) 2.5 % ophthalmic solution Place 1 drop into both eyes at bedtime.    . metolazone (ZAROXOLYN) 2.5 MG tablet Take 2.5 mg twice a week 45 tablet 3  . potassium chloride (K-DUR) 10 MEQ tablet Take 4 tablets ( 40 meq ) three times a day 1080 tablet 3  . Propylene Glycol 0.6 % SOLN Place 1 drop into both eyes 2 (two) times daily.    Marland Kitchen  warfarin (COUMADIN) 5 MG tablet TAKE 1 TO 2 TABLETS EVERY DAY AS DIRECTED BY COUMADIN CLINIC 180 tablet 1   No facility-administered medications prior to visit.      Allergies:   Asa buff (mag [buffered aspirin]; Codeine; Mirtazapine; and Ultram [tramadol hcl]   Social History   Socioeconomic History  . Marital status: Married    Spouse name: None  . Number of children: 0  . Years of education: None  . Highest education level: None  Social Needs  . Financial resource strain: None  . Food insecurity - worry: None  . Food insecurity - inability: None  . Transportation needs - medical: None  .  Transportation needs - non-medical: None  Occupational History  . 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 Smoking status: Never Smoker  . Smokeless tobacco: Never Used  Substance and Sexual Activity  . Alcohol use: No  . Drug use: No  . Sexual activity: No  Other Topics Concern  . None  Social History Narrative  . None     Family History:  The patient's family history includes Diabetes in her mother; Heart attack in her mother; Other in her father; Stroke in her mother.   ROS:   Please see the history of present illness.    ROS All other systems reviewed and are negative.   PHYSICAL EXAM:   VS:  BP (!) 126/52   Pulse 96   Ht 5' 5.5" (1.664 m)   Wt 140 lb (63.5 kg)   BMI 22.94 kg/m    GENERAL:  Elderly WF in NAD HEENT:  PERRL, EOMI, sclera are clear. Oropharynx is clear. NECK:  no jugular venous distention, carotid upstroke brisk and symmetric, no bruits, no thyromegaly or adenopathy LUNGS:  Clear to auscultation bilaterally CHEST:  Unremarkable HEART:  IRRR,  PMI not displaced or sustained, good mechanical AV and MV clicks.  no S3, no S4:  no rubs, systolic ejection murmur RUSB ABD:  Soft, nontender. BS +, no masses or bruits. No hepatomegaly, no splenomegaly EXT:  2 + pulses throughout, trace  2+ Edema with no skin breakdown. Support stockings in place. No redness. SKIN:  Warm and dry.  No rashes NEURO:  Alert and oriented x 3. Cranial nerves II through XII intact. PSYCH:  Cognitively intact    Wt Readings from Last 3 Encounters:  11/02/17 140 lb (63.5 kg)  09/15/17 135 lb 9.6 oz (61.5 kg)  08/24/17 143 lb 9.6 oz (65.1 kg)      Studies/Labs Reviewed:   EKG:  EKG is not ordered today.     Recent Labs: 08/24/2017: ALT 50 09/23/2017: Magnesium 1.8 09/30/2017: BUN 22; Creatinine, Ser 1.22; Potassium 3.4; Sodium 138   Lipid Panel No results found for: CHOL, TRIG, HDL, CHOLHDL, VLDL, LDLCALC, LDLDIRECT  Additional  studies/ records that were reviewed today include:  Labs dated 01/13/17: cholesterol 236, triglycerides 171, HDL 74, LDL 127.  03/10/17: CMET normal. Dated 07/26/17 Hgb 10.2. Dated 08/09/17: ALT 67. BMET normal. Creatinine 0.91 Dated 10/19/17: A1c 4.9%, Hgb 9.1, creatinine 1.1. Potassium 3.6. ALT 22.   CLINICAL DATA:  Elevated transaminase level, localized edema  EXAM: CT ABDOMEN AND PELVIS WITH CONTRAST  TECHNIQUE: Multidetector CT imaging of the abdomen and pelvis was performed using the standard protocol following bolus administration of intravenous contrast.  CONTRAST:  100 cc [id number]  COMPARISON:  None.  FINDINGS: Lower chest: There is a moderate size right pleural  effusion present with resultant partial atelectasis of the right lower lobe. Partial atelectasis of the right middle lobe also is noted. The left lung base is clear. The heart is mildly enlarged. No pericardial effusion is seen.  Hepatobiliary: There is a low-attenuation structure near the dome of the right lobe of liver on image 17 measuring 9 mm in diameter. Smaller low-attenuation structures are noted in the caudal right lobe peripherally as well all which appear most typical of a benign process such as slightly complex cysts. The gallbladder is well seen with very minimal distension, but multiple gallstones are present within the gallbladder. The gallbladder wall is not thickened and the common bile duct does not appear to be distended.  Pancreas: The pancreas is moderately well seen with no abnormality noted. The pancreatic duct is not dilated.  Spleen: The spleen is within normal limits in size.  Adrenals/Urinary Tract: The adrenal glands are unremarkable. The kidneys enhance with no calculus or mass. No hydronephrosis is seen. On delayed images, no abnormality is noted. The ureters appear to be normal in caliber. The urinary bladder is not well distended and therefore is difficult to assess.  No gross abnormality is seen.  Stomach/Bowel: The stomach is largely decompressed and unremarkable. No small bowel distention is seen. No abnormality of the colon is seen. The terminal ileum opacifies the appendix is visualized in the right lower quadrant and measures 8 mm in diameter. No surrounding evidence of inflammation is seen but clinical correlation is recommended to exclude developing appendicitis.  Vascular/Lymphatic: Moderate abdominal aortic atherosclerosis is noted. No adenopathy is seen.  Reproductive: The uterus has previously been resected. No adnexal lesion is seen. No fluid is noted within the pelvis.  Other: None.  Musculoskeletal: The lumbar vertebrae are normal alignment with degenerative change within the facet joints of the lower lumbar spine.  IMPRESSION: 1. Multiple gallstones within slightly distended gallbladder. No gallbladder wall thickening is seen. Correlate clinically. 2. Moderate size right pleural effusion with partial atelectasis of the right lower and right middle lobes. 3. Slight prominence of the appendix but no local inflammatory change is seen. Again correlate clinically to exclude developing acute appendicitis. 4. Moderate abdominal aortic atherosclerosis.   Electronically Signed   By: Ivar Drape M.D.   On: 07/20/2017 15:01   ASSESSMENT:    1. Acute on chronic diastolic CHF (congestive heart failure) ([place])   2. S/P AVR (aortic valve replacement)   3. S/P MVR (mitral valve replacement)   4. Chronic atrial fibrillation ([facility])      PLAN:  In order of problems listed above:   1. Valvular heart disease status post redo aortic valve replacement x3 and redo mitral valve surgery x2. Valve function is stable by exam. Continue on coumadin. With recent abnormal liver function INR has been fluctuating significantly. Pharmacy to adjust today. SBE prophylaxis. Her last surgery was in 2012.    2. Chronic anticoagulation. Continue  coumadin. Check INR today.  3.  Atrial fibrillation. Chronic. Rate controlled.  Continue dig and coumadin.   4. Marked LE edema. Multifactorial with diastolic CHF, venous insufficiency. Still trying to find optimal diuretic dose. Potassium is finally in the normal range. Renal function stable so will increase metolazone back to 3 days a week. Continue current potassium supplementation.  Continue  compression hose. Continue sodium restriction. I will follow up in 2 months.   Medication Adjustments/Labs and Tests Ordered: Current medicines are reviewed at length with the patient today.  Concerns regarding medicines are outlined  above.  Medication changes, Labs and Tests ordered today are listed in the Patient Instructions below. Patient Instructions  Increase metolazone to three days a week Sunday, Tuesday, and Thursday  Continue your other therapy  I will see you in 2 months      Signed, Lezly Rumpf [place], MD  11/02/2017 1:40 PM    [facility] [address] [place], [place], [address] [phone]

## 2017-10-28 ENCOUNTER — Other Ambulatory Visit: Payer: Self-pay

## 2017-11-02 ENCOUNTER — Other Ambulatory Visit: Payer: Self-pay

## 2017-11-02 ENCOUNTER — Ambulatory Visit (INDEPENDENT_AMBULATORY_CARE_PROVIDER_SITE_OTHER): Payer: Medicare Other | Admitting: Pharmacist Clinician (PhC)/ Clinical Pharmacy Specialist

## 2017-11-02 ENCOUNTER — Ambulatory Visit: Payer: Medicare Other | Admitting: Cardiology

## 2017-11-02 ENCOUNTER — Encounter: Payer: Self-pay | Admitting: Cardiology

## 2017-11-02 VITALS — BP 126/52 | HR 96 | Ht 65.5 in | Wt 140.0 lb

## 2017-11-02 DIAGNOSIS — Z5181 Encounter for therapeutic drug level monitoring: Secondary | ICD-10-CM

## 2017-11-02 DIAGNOSIS — I5033 Acute on chronic diastolic (congestive) heart failure: Secondary | ICD-10-CM

## 2017-11-02 DIAGNOSIS — I482 Chronic atrial fibrillation, unspecified: Secondary | ICD-10-CM

## 2017-11-02 DIAGNOSIS — Z952 Presence of prosthetic heart valve: Secondary | ICD-10-CM

## 2017-11-02 LAB — POCT INR: INR: 1.8

## 2017-11-02 MED ORDER — POTASSIUM CHLORIDE ER 10 MEQ PO TBCR: EXTENDED_RELEASE_TABLET | ORAL | 3 refills | Status: DC

## 2017-11-02 MED ORDER — METOLAZONE 2.5 MG PO TABS: ORAL_TABLET | ORAL | 3 refills | Status: DC

## 2017-11-02 NOTE — Patient Instructions (Addendum)
Increase metolazone to three days a week Sunday, Tuesday, and Thursday  Continue your other therapy  I will see you in 2 months

## 2017-11-02 NOTE — Patient Instructions (Signed)
Description   Increase dose to 1 tablet each Sunday and Wednesday, 1/2 tablet all other days, repeat INR in 2 week.

## 2017-11-16 ENCOUNTER — Ambulatory Visit (INDEPENDENT_AMBULATORY_CARE_PROVIDER_SITE_OTHER): Payer: Medicare Other | Admitting: Pharmacist

## 2017-11-16 DIAGNOSIS — Z5181 Encounter for therapeutic drug level monitoring: Secondary | ICD-10-CM | POA: Diagnosis not present

## 2017-11-16 LAB — POCT INR: INR: 2.4

## 2017-11-17 ENCOUNTER — Ambulatory Visit: Payer: Medicare Other | Admitting: Neurology

## 2017-11-17 ENCOUNTER — Ambulatory Visit
Admission: RE | Admit: 2017-11-17 | Discharge: 2017-11-17 | Disposition: A | Discharge status: Home / Self Care | Payer: Medicare Other | Source: Ambulatory Visit | Attending: Neurology | Admitting: Neurology

## 2017-11-17 ENCOUNTER — Other Ambulatory Visit: Payer: Self-pay

## 2017-11-17 ENCOUNTER — Encounter: Payer: Self-pay | Admitting: Neurology

## 2017-11-17 VITALS — BP 140/52 | HR 105 | Ht 65.5 in | Wt 138.0 lb

## 2017-11-17 DIAGNOSIS — R410 Disorientation, unspecified: Secondary | ICD-10-CM

## 2017-11-17 NOTE — Patient Instructions (Signed)
1. Schedule head CT without contrast 2. Continue all your medications 3. Continue to monitor home situation if you need more help at night 4. Follow-up in 6 months, call for any changes  FALL PRECAUTIONS: Be cautious when walking. Scan the area for obstacles that may increase the risk of trips and falls. When getting up in the mornings, sit up at the edge of the bed for a few minutes before getting out of bed. Consider elevating the bed at the head end to avoid drop of blood pressure when getting up. Walk always in a well-lit room (use night lights in the walls). Avoid area rugs or power cords from appliances in the middle of the walkways. Use a walker or a cane if necessary and consider physical therapy for balance exercise. Get your eyesight checked regularly.  FINANCIAL OVERSIGHT: Supervision, especially oversight when making financial decisions or transactions is also recommended.  HOME SAFETY: Consider the safety of the kitchen when operating appliances like stoves, microwave oven, and blender. Consider having supervision and share cooking responsibilities until no longer able to participate in those. Accidents with firearms and other hazards in the house should be identified and addressed as well.  DRIVING: Regarding driving, in patients with progressive memory problems, driving will be impaired. We advise to have someone else do the driving if trouble finding directions or if minor accidents are reported. Independent driving assessment is available to determine safety of driving.  ABILITY TO BE LEFT ALONE: If patient is unable to contact 911 operator, consider using LifeLine, or when the need is there, arrange for someone to stay with patients. Smoking is a fire hazard, consider supervision or cessation. Risk of wandering should be assessed by caregiver and if detected at any point, supervision and safe proof recommendations should be instituted.  MEDICATION SUPERVISION: Inability to  self-administer medication needs to be constantly addressed. Implement a mechanism to ensure safe administration of the medications.  RECOMMENDATIONS FOR ALL PATIENTS WITH MEMORY PROBLEMS: 1. Continue to exercise (Recommend 30 minutes of walking everyday, or 3 hours every week) 2. Increase social interactions - continue going to [place] and enjoy social gatherings with friends and family 3. Eat healthy, avoid fried foods and eat more fruits and vegetables 4. Maintain adequate blood pressure, blood sugar, and blood cholesterol level. Reducing the risk of stroke and cardiovascular disease also helps promoting better memory. 5. Avoid stressful situations. Live a simple life and avoid aggravations. Organize your time and prepare for the next day in anticipation. 6. Sleep well, avoid any interruptions of sleep and avoid any distractions in the bedroom that may interfere with adequate sleep quality 7. Avoid sugar, avoid sweets as there is a strong link between excessive sugar intake, diabetes, and cognitive impairment We discussed the Mediterranean diet, which has been shown to help patients reduce the risk of progressive memory disorders and reduces cardiovascular risk. This includes eating fish, eat fruits and green leafy vegetables, nuts like almonds and hazelnuts, walnuts, and also use olive oil. Avoid fast foods and fried foods as much as possible. Avoid sweets and sugar as sugar use has been linked to worsening of memory function.  There is always a concern of gradual progression of memory problems. If this is the case, then we may need to adjust level of care according to patient needs. Support, both to the patient and caregiver, should then be put into place.

## 2017-11-17 NOTE — Progress Notes (Signed)
NEUROLOGY CONSULTATION NOTE  [name] Chung MRN: [id number] DOB: 30-Mar-1933  Referring provider: Dr. Lajean Manes Primary care provider: Dr. Lajean Manes  Reason for consult:  Increasing confusion  Dear Dr Felipa Eth:  Thank you for your kind referral of [name] Chung for consultation of the above symptoms. Although her history is well known to you, please allow me to reiterate it for the purpose of our medical record. The patient was accompanied to the clinic by a family friend (stepdaughter's friend), caregiver who also provide collateral information. I also spoke to her stepdaughter [name] Chung on the phone. Her husband with severe dementia is also present. Records and images were personally reviewed where available.   HISTORY OF PRESENT ILLNESS: This is a pleasant 84 year old right-handed woman with a history of atrial fibrillation on Coumadin, aortic and mitral valve replacement, presenting for evaluation of an episode of transient confusion last November 2018. She was walking to the living room with their caregiver when she suddenly said she thought she was having a stroke. Her right hand was clenched, she was drooling and her face was drooped, she had a hard time hearing out of her left ear, and she was extremely confused. She did not go to the hospital. 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 She was confused for a few months, and cleared up by the end of January. They were also moving to assisted living around that time, and she was very confused during the move. Family reports she is "light years" improved from November. She still feels that her mouth is drooped on the left at night, her tongue feels strange on the left side, and her speech is still a little off, but the confusion is pretty much gone.   She used to have headaches and was taking Fioricet twice a day up until October. She also used to take Temazepam,  which was stopped as well. The headaches have subsided quite a bit, she usually just lies down when she has a headache. She does not sleep well, even when her husband sleeps at night. They have a caregiver with them during the day only. She gets dizzy when she gets up and turns fast, no falls. SHe denies any diplopia, dysarthria/dysphagia, neck/back pain, focal numbness/tingling/weakness except for a numb spot on her left foot since she had bipedal edema in October 2018. She has noticed that when she has a bowel movement, it feels like her stool is coming out mor on the right side of her anal canal.    PAST MEDICAL HISTORY: Past Medical History:  Diagnosis Date  . Aortic insufficiency   . Arthritis   . Chronic anticoagulation   . Chronic atrial fibrillation ([place])   . Edema   . Rheumatic heart disease   . Valvular heart disease     PAST SURGICAL HISTORY: Past Surgical History:  Procedure Laterality Date  . ABDOMINAL HYSTERECTOMY    . AORTIC VALVE REPLACEMENT     1982, redo 1995 homograft, redo 2012 Medronic valve conduit-freestyle  . BACK SURGERY    . CATARACT EXTRACTION W/ INTRAOCULAR LENS  IMPLANT, BILATERAL    . MITRAL VALVE REPLACEMENT     1982, redo 1995 [employer] Jude  valve    MEDICATIONS: Current Outpatient Medications on File Prior to Visit  Medication Sig Dispense Refill  . digoxin (LANOXIN) 0.125 MG tablet Take 1/2 tablet ( 0.0625 mg ) daily 45 tablet 3  .  fluorouracil (EFUDEX) 5 % cream     . furosemide (LASIX) 20 MG tablet Take 2 tablets (40 mg total) by mouth 2 (two) times daily. 360 tablet 3  . hydrochlorothiazide (HYDRODIURIL) 50 MG tablet Take 1 tablet (50 mg total) by mouth daily. 90 tablet 3  . hydroxypropyl methylcellulose / hypromellose (ISOPTO TEARS / GONIOVISC) 2.5 % ophthalmic solution Place 1 drop into both eyes at bedtime.    . metolazone (ZAROXOLYN) 2.5 MG tablet Take 2.5 mg three times a week. Sunday, Tuesday, Thursday 45 tablet 3  . mupirocin ointment  (BACTROBAN) 2 %     . potassium chloride (K-DUR) 10 MEQ tablet Take 4 tablets ( 40 meq ) three times a day 1008 tablet 3  . Propylene Glycol 0.6 % SOLN Place 1 drop into both eyes 2 (two) times daily.    . traZODone (DESYREL) 50 MG tablet Take 50 mg by mouth at bedtime.    Marland Kitchen warfarin (COUMADIN) 5 MG tablet TAKE 1 TO 2 TABLETS EVERY DAY AS DIRECTED BY COUMADIN CLINIC 180 tablet 1   No current facility-administered medications on file prior to visit.     ALLERGIES: Allergies  Allergen Reactions  . Asa Buff (Mag [Buffered Aspirin] Other (See Comments)    On coumadin  . Codeine   . Mirtazapine Nausea Only  . Ultram [Tramadol Hcl]     FAMILY HISTORY: Family History  Problem Relation Age of Onset  . Heart attack Mother   . Stroke Mother   . Diabetes Mother   . Other Father        CAR ACCIDENT    SOCIAL HISTORY: Social History   Socioeconomic History  . Marital status: Married    Spouse name: Not on file  . Number of children: 0  . Years of education: Not on file  . Highest education level: Not on file  Social Needs  . Financial resource strain: Not on file  . Food insecurity - worry: Not on file  . Food insecurity - inability: Not on file  . Transportation needs - medical: Not on file  . Transportation needs - non-medical: Not on file  Occupational History  . 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 Smoking status: Never Smoker  . Smokeless tobacco: Never Used  Substance and Sexual Activity  . Alcohol use: No  . Drug use: No  . Sexual activity: No  Other Topics Concern  . Not on file  Social History Narrative  . Not on file    REVIEW OF SYSTEMS: Constitutional: No fevers, chills, or sweats, no generalized fatigue, change in appetite Eyes: No visual changes, double vision, eye pain Ear, nose and throat: No hearing loss, ear pain, nasal congestion, sore throat Cardiovascular: No chest pain, palpitations Respiratory:  No  shortness of breath at rest or with exertion, wheezes GastrointestinaI: No nausea, vomiting, diarrhea, abdominal pain, fecal incontinence Genitourinary:  No dysuria, urinary retention or frequency Musculoskeletal:  No neck pain, back pain Integumentary: No rash, pruritus, skin lesions Neurological: as above Psychiatric: No depression, insomnia, anxiety Endocrine: No palpitations, fatigue, diaphoresis, mood swings, change in appetite, change in weight, increased thirst Hematologic/Lymphatic:  No anemia, purpura, petechiae. Allergic/Immunologic: no itchy/runny eyes, nasal congestion, recent allergic reactions, rashes  PHYSICAL EXAM: Vitals:   11/17/17 1453  BP: (!) 140/52  Pulse: (!) 105  SpO2: (!) 89%   General: No acute distress Head:  Normocephalic/atraumatic Eyes: Fundoscopic exam shows bilateral sharp discs,  no vessel changes, exudates, or hemorrhages Neck: supple, no paraspinal tenderness, full range of motion Back: No paraspinal tenderness Heart: regular rate and rhythm Lungs: Clear to auscultation bilaterally. Vascular: No carotid bruits. Skin/Extremities: No rash, +bipedal edema with compression hose on Neurological Exam: Mental status: alert and oriented to person, place, and time, no dysarthria or aphasia, Fund of knowledge is appropriate.  Recent and remote memory are intact.  Attention and concentration are normal.    Able to name objects and repeat phrases. CDT 5/5 MMSE - Mini Mental State Exam 11/18/2017  Orientation to time 5  Orientation to Place 5  Registration 3  Attention/ Calculation 3  Recall 2  Language- name 2 objects 2  Language- repeat 1  Language- follow 3 step command 3  Language- read & follow direction 1  Write a sentence 1  Copy design 1  Total score 27   Cranial nerves: CN I: not tested CN II: pupils equal, round and reactive to light, visual fields intact, fundi unremarkable. CN III, IV, VI:  full range of motion, no nystagmus, no ptosis CN  V: facial sensation intact CN VII: upper and lower face symmetric CN VIII: hearing intact to finger rub CN IX, X: gag intact, uvula midline CN XI: sternocleidomastoid and trapezius muscles intact CN XII: tongue midline Bulk & Tone: normal, no fasciculations. Motor: 5/5 throughout with no pronator drift. Sensation: intact to light touch, cold, pin, vibration and joint position sense.  No extinction to double simultaneous stimulation.  Romberg test negative Deep Tendon Reflexes: brisk +2 throughout, no ankle clonus, negative Hoffman sign Plantar responses: downgoing bilaterally Cerebellar: no incoordination on finger to nose, heel to shin. No dysdiadochokinesia Gait: narrow-based and steady, able to tandem walk adequately. Tremor: mild endpoint tremor bilaterally, left>right. No resting or postural tremor  IMPRESSION: This is a pleasant 84 year old right-handed woman with a history of atrial fibrillation on Coumadin, aortic and mitral valve replacement, recently diagnosed non-alcoholic liver cirrhosis after she presented with altered mental status in November 2018 and found to have hepatic encephalopathy. Cognitive changes improved with treatment with Xifaxan. She had also reported left facial droop and transient left hearing loss. She still feels the left side of her face is different at times, and left side of tongue is numb. Neurological exam is largely non-focal, she has brisk reflexes and mild endpoint tremor, MMSE normal 27/30. She has a metallic valve, we will do a head CT without contrast to assess for any evidence of stroke, although discussed that this would not change medical management, she is already on anticoagulation, we discussed control of vascular risk factors, as well as physical exercise and brain stimulation exercise for brain health. She now lives in [place] with her husband with dementia, they have a caregiver in the daytime but she takes care of him at night, we discussed getting  more help with the evening hours but she would like to hold off for now, continue to monitor. She will follow-up in 6 months and knows to call for any changes.   Thank you for allowing me to participate in the care of this patient. Please do not hesitate to call for any questions or concerns.   Ellouise Newer, M.D.  CC: Dr. Felipa Eth

## 2017-11-18 ENCOUNTER — Telehealth: Payer: Self-pay

## 2017-11-18 ENCOUNTER — Encounter: Payer: Self-pay | Admitting: Neurology

## 2017-11-18 NOTE — Telephone Encounter (Signed)
LMOVM for [employer] below.

## 2017-11-18 NOTE — Telephone Encounter (Signed)
-----   Message from Cameron Sprang, MD sent at 11/18/2017 10:24 AM EST ----- Her stepdaughter Lorriane Shire requested to be called for results, please let her know the head CT is not as sensitive as an MRI brain, but it did not show any evidence of new stroke or bleed. There were age-related changes seen. Thanks

## 2017-12-07 ENCOUNTER — Ambulatory Visit (INDEPENDENT_AMBULATORY_CARE_PROVIDER_SITE_OTHER): Payer: Medicare Other | Admitting: Pharmacist

## 2017-12-07 DIAGNOSIS — Z5181 Encounter for therapeutic drug level monitoring: Secondary | ICD-10-CM | POA: Diagnosis not present

## 2017-12-07 LAB — POCT INR: INR: 2.5

## 2017-12-23 ENCOUNTER — Telehealth: Payer: Self-pay | Admitting: Cardiology

## 2017-12-23 NOTE — Telephone Encounter (Signed)
Left a message to call back.

## 2017-12-23 NOTE — Telephone Encounter (Signed)
New Message:        Pt is refusing to take her Lacutose,(she is not sure of the spelling)Would you please check her liver functions when she comes for her appt on 12-27-17. She thinks it could be getting worse.

## 2017-12-23 NOTE — Telephone Encounter (Signed)
Spoke with Lorriane Shire C and she stated patient stopped Lactulose completely about 2 weeks ago. Per Lorriane Shire this was given to her for her liver. She would like to have liver functions done at her appointment on Monday and she will not be with her. Will forward to Dr Martinique and Jordan Likes

## 2017-12-23 NOTE — Telephone Encounter (Signed)
F/U Call: [name] Chung (stepdaughter)   Returning your call.

## 2017-12-24 NOTE — Telephone Encounter (Signed)
OK to check a CMET when we see her on visit.   Gervis Gaba Martin[place] MD, [facility]

## 2017-12-24 NOTE — Telephone Encounter (Signed)
Left message on [name] Chung's personal voice mail Dr.Jordan's advice.

## 2017-12-25 NOTE — Progress Notes (Signed)
Cardiology Office Note    Date:  12/25/2017   ID:  [name] Chung 04-24-1933, MRN [id number]  PCP:  [name] Manes, MD  Cardiologist:  [name] Hollinger Martinique, MD    History of Present Illness:  [name] Chung is a 84 y.o. female with a history of Rheumatic heart disease. She is seen for follow up of  lower extremity edema and CHF. She underwent redo open heart surgery in December 2012 at [facility]. This included redo aortic valve replacement with a Medtronic freestyle aortic root conduit. She also underwent tricuspid valve annuloplasty and repair. The mitral valve replacement was unchanged. She had initial surgery for rheumatic heart disease in 1982 with both aortic and mitral valve replacements with Bjork Shiley valves. She had repeat surgery in 1995 with a homograft aortic valve and a St. Jude mechanical mitral valve replacement. She is on chronic Coumadin.  She was seen the fall of 2018 by Dr. Felipa Chung with findings of marked lower extremity edema and elevated LFTs. She reports this started in September and  progressed.  Evaluated by Dr [name] Chung who felt she may have congestive hepatopathy. Given diuretics and legs wrapped but step daughter states there has been little improvement. Initially weight at home declined from 145>141 lbs but increased to 143 lbs. We later  continued lasix 40 mg bid and added metolazone 2.5 mg daily. She had a good response to this with marked reduction of swelling. Potassium was very low and creatinine increased. Metolazone was reduced and potassium increased.   Today she is seen with her caregiver. She is doing very well.  Dr. Paulita Chung added lactulose but she states this made her nauseated and throwing up her pills. Quit taking it 2 weeks ago. Marland Kitchen Swelling is down significantly. Weight is down 9 lbs.  She is now living at [facility]. She recently had a skin cancer removed from her left foot with cryotherapy. Complains of a lot of pain from this.    Past Medical  History:  Diagnosis Date  . Aortic insufficiency   . Arthritis   . Chronic anticoagulation   . Chronic atrial fibrillation ([place])   . Edema   . Rheumatic heart disease   . Valvular heart disease     Past Surgical History:  Procedure Laterality Date  . ABDOMINAL HYSTERECTOMY    . AORTIC VALVE REPLACEMENT     1982, redo 1995 homograft, redo 2012 Medronic valve conduit-freestyle  . BACK SURGERY    . CATARACT EXTRACTION W/ INTRAOCULAR LENS  IMPLANT, BILATERAL    . MITRAL VALVE REPLACEMENT     1982, redo 1995 St Jude  valve    Current Medications: Outpatient Medications Prior to Visit  Medication Sig Dispense Refill  . digoxin (LANOXIN) 0.125 MG tablet Take 1/2 tablet ( 0.0625 mg ) daily 45 tablet 3  . fluorouracil (EFUDEX) 5 % cream     . furosemide (LASIX) 20 MG tablet Take 2 tablets (40 mg total) by mouth 2 (two) times daily. 360 tablet 3  . hydrochlorothiazide (HYDRODIURIL) 50 MG tablet Take 1 tablet (50 mg total) by mouth daily. 90 tablet 3  . hydroxypropyl methylcellulose / hypromellose (ISOPTO TEARS / GONIOVISC) 2.5 % ophthalmic solution Place 1 drop into both eyes at bedtime.    . metolazone (ZAROXOLYN) 2.5 MG tablet Take 2.5 mg three times a week. Sunday, Tuesday, Thursday 45 tablet 3  . mupirocin ointment (BACTROBAN) 2 %     . potassium chloride (K-DUR) 10 MEQ tablet Take 4  tablets ( 40 meq ) three times a day 1008 tablet 3  . Propylene Glycol 0.6 % SOLN Place 1 drop into both eyes 2 (two) times daily.    . traZODone (DESYREL) 50 MG tablet Take 50 mg by mouth at bedtime.    Marland Kitchen warfarin (COUMADIN) 5 MG tablet TAKE 1 TO 2 TABLETS EVERY DAY AS DIRECTED BY COUMADIN CLINIC 180 tablet 1   No facility-administered medications prior to visit.      Allergies:   Asa buff (mag [buffered aspirin]; Codeine; Mirtazapine; and Ultram [tramadol hcl]   Social History   Socioeconomic History  . Marital status: Married    Spouse name: Not on file  . Number of children: 0  . Years of  education: Not on file  . Highest education level: Not on file  Occupational History  . Occupation: Best boy: [employer]    Comment: retired  Scientific laboratory technician  . Financial resource strain: Not on file  . Food insecurity:    Worry: Not on file    Inability: Not on file  . Transportation needs:    Medical: Not on file    Non-medical: Not on file  Tobacco Use  . Smoking status: Never Smoker  . Smokeless tobacco: Never Used  Substance and Sexual Activity  . Alcohol use: No  . Drug use: No  . Sexual activity: Never  Lifestyle  . Physical activity:    Days per week: Not on file    Minutes per session: Not on file  . Stress: Not on file  Relationships  . Social connections:    Talks on phone: Not on file    Gets together: Not on file    Attends religious service: Not on file    Active member of club or organization: Not on file    Attends meetings of clubs or organizations: Not on file    Relationship status: Not on file  Other Topics Concern  . Not on file  Social History Narrative  . Not on file     Family History:  The patient's family history includes Diabetes in her mother; Heart attack in her mother; Other in her father; Stroke in her mother.   ROS:   Please see the history of present illness.    ROS All other systems reviewed and are negative.   PHYSICAL EXAM:   VS:  There were no vitals taken for this visit.   GENERAL:  Elderly WF in NAD HEENT:  PERRL, EOMI, sclera are clear. Oropharynx is clear. NECK:  no jugular venous distention, carotid upstroke brisk and symmetric, no bruits, no thyromegaly or adenopathy LUNGS:  Clear to auscultation bilaterally CHEST:  Unremarkable HEART:  IRRR,  PMI not displaced or sustained, good mechanical AV and MV clicks.  no S3, no S4:  no rubs, systolic ejection murmur RUSB ABD:  Soft, nontender. BS +, no masses or bruits. No hepatomegaly, no splenomegaly EXT:  2 + pulses throughout, trace edema with no skin breakdown.  Cryoablation site is scabbed without erythema or drainage.  SKIN:  Warm and dry.  No rashes NEURO:  Alert and oriented x 3. Cranial nerves II through XII intact. PSYCH:  Cognitively intact    Wt Readings from Last 3 Encounters:  11/17/17 138 lb (62.6 kg)  11/02/17 140 lb (63.5 kg)  09/15/17 135 lb 9.6 oz (61.5 kg)      Studies/Labs Reviewed:   EKG:  EKG is not ordered today.  Recent Labs: 08/24/2017: ALT 50 09/23/2017: Magnesium 1.8 09/30/2017: BUN 22; Creatinine, Ser 1.22; Potassium 3.4; Sodium 138   Lipid Panel No results found for: CHOL, TRIG, HDL, CHOLHDL, VLDL, LDLCALC, LDLDIRECT  Additional studies/ records that were reviewed today include:  Labs dated 01/13/17: cholesterol 236, triglycerides 171, HDL 74, LDL 127.  03/10/17: CMET normal. Dated 07/26/17 Hgb 10.2. Dated 08/09/17: ALT 67. BMET normal. Creatinine 0.91 Dated 10/19/17: A1c 4.9%, Hgb 9.1, creatinine 1.1. Potassium 3.6. ALT 22.   CLINICAL DATA:  Elevated transaminase level, localized edema  EXAM: CT ABDOMEN AND PELVIS WITH CONTRAST  TECHNIQUE: Multidetector CT imaging of the abdomen and pelvis was performed using the standard protocol following bolus administration of intravenous contrast.  CONTRAST:  100 cc Isovue-300  COMPARISON:  None.  FINDINGS: Lower chest: There is a moderate size right pleural effusion present with resultant partial atelectasis of the right lower lobe. Partial atelectasis of the right middle lobe also is noted. The left lung base is clear. The heart is mildly enlarged. No pericardial effusion is seen.  Hepatobiliary: There is a low-attenuation structure near the dome of the right lobe of liver on image 17 measuring 9 mm in diameter. Smaller low-attenuation structures are noted in the caudal right lobe peripherally as well all which appear most typical of a benign process such as slightly complex cysts. The gallbladder is well seen with very minimal distension, but  multiple gallstones are present within the gallbladder. The gallbladder wall is not thickened and the common bile duct does not appear to be distended.  Pancreas: The pancreas is moderately well seen with no abnormality noted. The pancreatic duct is not dilated.  Spleen: The spleen is within normal limits in size.  Adrenals/Urinary Tract: The adrenal glands are unremarkable. The kidneys enhance with no calculus or mass. No hydronephrosis is seen. On delayed images, no abnormality is noted. The ureters appear to be normal in caliber. The urinary bladder is not well distended and therefore is difficult to assess. No gross abnormality is seen.  Stomach/Bowel: The stomach is largely decompressed and unremarkable. No small bowel distention is seen. No abnormality of the colon is seen. The terminal ileum opacifies the appendix is visualized in the right lower quadrant and measures 8 mm in diameter. No surrounding evidence of inflammation is seen but clinical correlation is recommended to exclude developing appendicitis.  Vascular/Lymphatic: Moderate abdominal aortic atherosclerosis is noted. No adenopathy is seen.  Reproductive: The uterus has previously been resected. No adnexal lesion is seen. No fluid is noted within the pelvis.  Other: None.  Musculoskeletal: The lumbar vertebrae are normal alignment with degenerative change within the facet joints of the lower lumbar spine.  IMPRESSION: 1. Multiple gallstones within slightly distended gallbladder. No gallbladder wall thickening is seen. Correlate clinically. 2. Moderate size right pleural effusion with partial atelectasis of the right lower and right middle lobes. 3. Slight prominence of the appendix but no local inflammatory change is seen. Again correlate clinically to exclude developing acute appendicitis. 4. Moderate abdominal aortic atherosclerosis.   Electronically Signed   By: Ivar Drape M.D.   On:  07/20/2017 15:01   Echo 08/19/17: Study Conclusions  - Left ventricle: The cavity size was normal. Wall thickness was   normal. Systolic function was normal. The estimated ejection   fraction was in the range of 60% to 65%. Wall motion was normal;   there were no regional wall motion abnormalities. The study is   not technically sufficient to allow evaluation of  LV diastolic   function. - Ventricular septum: Septal motion showed paradox. - Aortic valve: Valve area (VTI): 0.65 cm^2. Valve area (Vmax):   0.69 cm^2. Valve area (Vmean): 0.66 cm^2. - Mitral valve: A mechanical prosthesis was present. Valve area by   continuity equation (using LVOT flow): 0.54 cm^2. - Left atrium: The atrium was mildly dilated. - Right ventricle: Systolic function was mildly reduced. - Right atrium: The atrium was mildly dilated. - Atrial septum: No defect or patent foramen ovale was identified. - Tricuspid valve: Increased transvalvular gradients, suspect   previous annuloplasty repair. - Pulmonic valve: Peak gradient (S): 14 mm Hg. - Pulmonary arteries: Systolic pressure was mildly increased. - Pericardium, extracardiac: There was a right pleural effusion.  Impressions:  - High flow velocities are seen across all four cardiac valves,   suggestive of increased cardiac output (e.g. anemia,   thyrotoxicosis, infection, etc.). Gradients are measured at a   mean heart rate of 88 bpm. Relatively fast heart rate also   exaggerates gradients across the mitral and tricuspid valves..   The previous report described moderate to severe and perivalvular   aortic insufficiency, but no aortic insufficiency is seen on the   current study (suspect interval aortic valve surgery).Marland Kitchen LVOT flow   velocities were not accurately measured - this limits the ability   to calculate the cardiac output and fully evaluate valve   hemodynamics.  ASSESSMENT:    No diagnosis found.   PLAN:  In order of problems listed  above:   1. Valvular heart disease status post redo aortic valve replacement x3 and redo mitral valve surgery x2. Valve function is stable by exam. Continue on coumadin. INR 2.7 today. Pharmacy to adjust today. SBE prophylaxis. Her last surgery was in 2012.    2. Chronic anticoagulation. Continue coumadin.  3.  Atrial fibrillation. Chronic. Rate controlled.  Continue dig and coumadin. check Dig level today  4. Marked LE edema. Multifactorial with diastolic CHF, venous insufficiency, cirrhosis. Will reduce metolazone to 2.5 mg once a week. Continue lasix at current dose.   Continue current potassium supplementation pending lab work.  Continue  compression hose. Continue sodium restriction. I will follow up in 3 months. Will check CMET and ammonia level today.   Medication Adjustments/Labs and Tests Ordered: Current medicines are reviewed at length with the patient today.  Concerns regarding medicines are outlined above.  Medication changes, Labs and Tests ordered today are listed in the Patient Instructions below. There are no Patient Instructions on file for this visit.   Signed, Trayveon Beckford Martinique, MD  12/25/2017 1:41 PM    [facility] [address], [place], [place], [address] [phone]

## 2017-12-27 ENCOUNTER — Ambulatory Visit (INDEPENDENT_AMBULATORY_CARE_PROVIDER_SITE_OTHER): Payer: Medicare Other | Admitting: Pharmacist Clinician (PhC)/ Clinical Pharmacy Specialist

## 2017-12-27 ENCOUNTER — Other Ambulatory Visit: Payer: Self-pay | Admitting: Cardiology

## 2017-12-27 ENCOUNTER — Ambulatory Visit: Payer: Medicare Other | Admitting: Cardiology

## 2017-12-27 ENCOUNTER — Encounter: Payer: Self-pay | Admitting: Cardiology

## 2017-12-27 VITALS — BP 120/46 | HR 90 | Ht 65.5 in | Wt 129.0 lb

## 2017-12-27 DIAGNOSIS — Z952 Presence of prosthetic heart valve: Secondary | ICD-10-CM | POA: Diagnosis not present

## 2017-12-27 DIAGNOSIS — I482 Chronic atrial fibrillation, unspecified: Secondary | ICD-10-CM

## 2017-12-27 DIAGNOSIS — I5033 Acute on chronic diastolic (congestive) heart failure: Secondary | ICD-10-CM

## 2017-12-27 DIAGNOSIS — Z5181 Encounter for therapeutic drug level monitoring: Secondary | ICD-10-CM

## 2017-12-27 LAB — POCT INR: INR: 2.7

## 2017-12-27 NOTE — Patient Instructions (Addendum)
Reduce metolazone to 2.5 mg one day a week  Continue your other therapy  We will check blood work today

## 2017-12-28 ENCOUNTER — Other Ambulatory Visit: Payer: Self-pay

## 2017-12-28 DIAGNOSIS — E876 Hypokalemia: Secondary | ICD-10-CM

## 2017-12-28 LAB — COMPREHENSIVE METABOLIC PANEL
A/G RATIO: 0.8 — AB (ref 1.2–2.2)
ALBUMIN: 3.2 g/dL — AB (ref 3.5–4.7)
ALK PHOS: 78 IU/L (ref 39–117)
ALT: 18 IU/L (ref 0–32)
AST: 49 IU/L — ABNORMAL HIGH (ref 0–40)
BILIRUBIN TOTAL: 0.9 mg/dL (ref 0.0–1.2)
BUN / CREAT RATIO: 16 (ref 12–28)
BUN: 20 mg/dL (ref 8–27)
CHLORIDE: 95 mmol/L — AB (ref 96–106)
CO2: 29 mmol/L (ref 20–29)
Calcium: 9 mg/dL (ref 8.7–10.3)
Creatinine, Ser: 1.26 mg/dL — ABNORMAL HIGH (ref 0.57–1.00)
GFR calc non Af Amer: 39 mL/min/{1.73_m2} — ABNORMAL LOW (ref >59)
GFR, EST AFRICAN AMERICAN: 46 mL/min/{1.73_m2} — AB (ref >59)
GLOBULIN, TOTAL: 4.1 g/dL (ref 1.5–4.5)
Glucose: 85 mg/dL (ref 65–99)
Potassium: 2.6 mmol/L — CL (ref 3.5–5.2)
SODIUM: 142 mmol/L (ref 134–144)
TOTAL PROTEIN: 7.3 g/dL (ref 6.0–8.5)

## 2017-12-28 LAB — CBC WITH DIFFERENTIAL/PLATELET
BASOS: 0 %
Basophils Absolute: 0 10*3/uL (ref 0.0–0.2)
EOS (ABSOLUTE): 0.2 10*3/uL (ref 0.0–0.4)
Eos: 2 %
HEMATOCRIT: 31.3 % — AB (ref 34.0–46.6)
HEMOGLOBIN: 10.5 g/dL — AB (ref 11.1–15.9)
IMMATURE GRANS (ABS): 0 10*3/uL (ref 0.0–0.1)
Immature Granulocytes: 0 %
LYMPHS ABS: 2.2 10*3/uL (ref 0.7–3.1)
Lymphs: 33 %
MCH: 35.6 pg — AB (ref 26.6–33.0)
MCHC: 33.5 g/dL (ref 31.5–35.7)
MCV: 106 fL — AB (ref 79–97)
MONOCYTES: 10 %
Monocytes Absolute: 0.7 10*3/uL (ref 0.1–0.9)
NEUTROS ABS: 3.7 10*3/uL (ref 1.4–7.0)
Neutrophils: 55 %
Platelets: 130 10*3/uL — ABNORMAL LOW (ref 150–379)
RBC: 2.95 x10E6/uL — ABNORMAL LOW (ref 3.77–5.28)
RDW: 14.5 % (ref 12.3–15.4)
WBC: 6.8 10*3/uL (ref 3.4–10.8)

## 2017-12-28 LAB — AMMONIA: Ammonia: 74 ug/dL (ref 19–87)

## 2017-12-28 LAB — DIGOXIN LEVEL: Digoxin, Serum: <0.4 ng/mL — ABNORMAL LOW (ref 0.5–0.9)

## 2017-12-29 ENCOUNTER — Telehealth: Payer: Self-pay | Admitting: Cardiology

## 2017-12-29 NOTE — Telephone Encounter (Signed)
Patient calling,  Pt c/o medication issue:  1. Name of Medication: Potassium chloride   2. How are you currently taking this medication (dosage and times per day)? 10 MEQ 6 x daily   3. Are you having a reaction (difficulty breathing--STAT)? no  4. What is your medication issue? Patient Would like to discuss new instructions for taking  6 tablets of potassium three times a day. Patient states that she was taking four previously.

## 2017-12-29 NOTE — Telephone Encounter (Signed)
Returned call to patient. Explained med change per lab results.  _________________________________  Notes recorded by Martinique, Peter M, MD on 12/28/2017 at 11:57 AM EDT Dig level is OK. Chronic anemia and chronic kidney disease is stable. LFTs look better. Ammonia level still pending. Potassium level is markedly low- agree with increase potassium supplement and we also decreased metolazone to once a week.  Peter Martinique MD, [facility]  ------  Notes recorded by Luanna Salk, LPN on 03/01/7823 at 23:53 AM EDT Spoke to DOD Dr.Berry he advised to increase Kdur to 60 meq three times a day.Repeat bmet in 1 week.Results given to patient's daughter n law Lorriane Shire.

## 2018-01-05 LAB — BASIC METABOLIC PANEL
BUN/Creatinine Ratio: 17 (ref 12–28)
BUN: 16 mg/dL (ref 8–27)
CALCIUM: 8.2 mg/dL — AB (ref 8.7–10.3)
CHLORIDE: 98 mmol/L (ref 96–106)
CO2: 24 mmol/L (ref 20–29)
Creatinine, Ser: 0.95 mg/dL (ref 0.57–1.00)
GFR calc Af Amer: 64 mL/min/{1.73_m2} (ref >59)
GFR calc non Af Amer: 56 mL/min/{1.73_m2} — ABNORMAL LOW (ref >59)
Glucose: 158 mg/dL — ABNORMAL HIGH (ref 65–99)
POTASSIUM: 3.4 mmol/L — AB (ref 3.5–5.2)
Sodium: 136 mmol/L (ref 134–144)

## 2018-01-19 ENCOUNTER — Encounter (HOSPITAL_COMMUNITY): Payer: Self-pay | Admitting: Emergency Medicine

## 2018-01-19 ENCOUNTER — Other Ambulatory Visit: Payer: Self-pay

## 2018-01-19 ENCOUNTER — Emergency Department (HOSPITAL_COMMUNITY): Payer: Medicare Other

## 2018-01-19 ENCOUNTER — Inpatient Hospital Stay (HOSPITAL_COMMUNITY)
Admission: EM | Admit: 2018-01-19 | Discharge: 2018-03-05 | DRG: 853 | Disposition: E | Payer: Medicare Other | Attending: Internal Medicine | Admitting: Internal Medicine

## 2018-01-19 DIAGNOSIS — E871 Hypo-osmolality and hyponatremia: Secondary | ICD-10-CM | POA: Diagnosis present

## 2018-01-19 DIAGNOSIS — J9690 Respiratory failure, unspecified, unspecified whether with hypoxia or hypercapnia: Secondary | ICD-10-CM

## 2018-01-19 DIAGNOSIS — Z7189 Other specified counseling: Secondary | ICD-10-CM

## 2018-01-19 DIAGNOSIS — R140 Abdominal distension (gaseous): Secondary | ICD-10-CM

## 2018-01-19 DIAGNOSIS — I2510 Atherosclerotic heart disease of native coronary artery without angina pectoris: Secondary | ICD-10-CM | POA: Diagnosis present

## 2018-01-19 DIAGNOSIS — I482 Chronic atrial fibrillation, unspecified: Secondary | ICD-10-CM | POA: Diagnosis present

## 2018-01-19 DIAGNOSIS — Z90710 Acquired absence of both cervix and uterus: Secondary | ICD-10-CM

## 2018-01-19 DIAGNOSIS — K56609 Unspecified intestinal obstruction, unspecified as to partial versus complete obstruction: Secondary | ICD-10-CM | POA: Diagnosis present

## 2018-01-19 DIAGNOSIS — I5033 Acute on chronic diastolic (congestive) heart failure: Secondary | ICD-10-CM

## 2018-01-19 DIAGNOSIS — E876 Hypokalemia: Secondary | ICD-10-CM | POA: Diagnosis not present

## 2018-01-19 DIAGNOSIS — K766 Portal hypertension: Secondary | ICD-10-CM | POA: Diagnosis present

## 2018-01-19 DIAGNOSIS — J811 Chronic pulmonary edema: Secondary | ICD-10-CM

## 2018-01-19 DIAGNOSIS — R338 Other retention of urine: Secondary | ICD-10-CM | POA: Diagnosis not present

## 2018-01-19 DIAGNOSIS — R1310 Dysphagia, unspecified: Secondary | ICD-10-CM | POA: Diagnosis present

## 2018-01-19 DIAGNOSIS — Z681 Body mass index (BMI) 19 or less, adult: Secondary | ICD-10-CM

## 2018-01-19 DIAGNOSIS — M79604 Pain in right leg: Secondary | ICD-10-CM | POA: Diagnosis not present

## 2018-01-19 DIAGNOSIS — R109 Unspecified abdominal pain: Secondary | ICD-10-CM | POA: Diagnosis not present

## 2018-01-19 DIAGNOSIS — T17908A Unspecified foreign body in respiratory tract, part unspecified causing other injury, initial encounter: Secondary | ICD-10-CM | POA: Diagnosis not present

## 2018-01-19 DIAGNOSIS — Z741 Need for assistance with personal care: Secondary | ICD-10-CM

## 2018-01-19 DIAGNOSIS — K567 Ileus, unspecified: Secondary | ICD-10-CM

## 2018-01-19 DIAGNOSIS — I481 Persistent atrial fibrillation: Secondary | ICD-10-CM | POA: Diagnosis present

## 2018-01-19 DIAGNOSIS — Z7901 Long term (current) use of anticoagulants: Secondary | ICD-10-CM | POA: Diagnosis not present

## 2018-01-19 DIAGNOSIS — K6819 Other retroperitoneal abscess: Secondary | ICD-10-CM | POA: Diagnosis present

## 2018-01-19 DIAGNOSIS — Z95828 Presence of other vascular implants and grafts: Secondary | ICD-10-CM | POA: Diagnosis not present

## 2018-01-19 DIAGNOSIS — R791 Abnormal coagulation profile: Secondary | ICD-10-CM | POA: Diagnosis not present

## 2018-01-19 DIAGNOSIS — K3533 Acute appendicitis with perforation and localized peritonitis, with abscess: Secondary | ICD-10-CM | POA: Diagnosis present

## 2018-01-19 DIAGNOSIS — D638 Anemia in other chronic diseases classified elsewhere: Secondary | ICD-10-CM | POA: Diagnosis present

## 2018-01-19 DIAGNOSIS — K529 Noninfective gastroenteritis and colitis, unspecified: Secondary | ICD-10-CM | POA: Diagnosis not present

## 2018-01-19 DIAGNOSIS — E860 Dehydration: Secondary | ICD-10-CM | POA: Diagnosis present

## 2018-01-19 DIAGNOSIS — N7093 Salpingitis and oophoritis, unspecified: Secondary | ICD-10-CM

## 2018-01-19 DIAGNOSIS — K56600 Partial intestinal obstruction, unspecified as to cause: Secondary | ICD-10-CM | POA: Diagnosis present

## 2018-01-19 DIAGNOSIS — K3532 Acute appendicitis with perforation and localized peritonitis, without abscess: Secondary | ICD-10-CM

## 2018-01-19 DIAGNOSIS — D62 Acute posthemorrhagic anemia: Secondary | ICD-10-CM | POA: Diagnosis not present

## 2018-01-19 DIAGNOSIS — Z452 Encounter for adjustment and management of vascular access device: Secondary | ICD-10-CM

## 2018-01-19 DIAGNOSIS — Z8249 Family history of ischemic heart disease and other diseases of the circulatory system: Secondary | ICD-10-CM

## 2018-01-19 DIAGNOSIS — Z952 Presence of prosthetic heart valve: Secondary | ICD-10-CM | POA: Diagnosis not present

## 2018-01-19 DIAGNOSIS — N736 Female pelvic peritoneal adhesions (postinfective): Secondary | ICD-10-CM | POA: Diagnosis present

## 2018-01-19 DIAGNOSIS — N189 Chronic kidney disease, unspecified: Secondary | ICD-10-CM | POA: Diagnosis not present

## 2018-01-19 DIAGNOSIS — R188 Other ascites: Secondary | ICD-10-CM | POA: Diagnosis not present

## 2018-01-19 DIAGNOSIS — K559 Vascular disorder of intestine, unspecified: Secondary | ICD-10-CM | POA: Diagnosis present

## 2018-01-19 DIAGNOSIS — M79672 Pain in left foot: Secondary | ICD-10-CM | POA: Diagnosis not present

## 2018-01-19 DIAGNOSIS — Z9841 Cataract extraction status, right eye: Secondary | ICD-10-CM

## 2018-01-19 DIAGNOSIS — Z9842 Cataract extraction status, left eye: Secondary | ICD-10-CM

## 2018-01-19 DIAGNOSIS — M79605 Pain in left leg: Secondary | ICD-10-CM | POA: Diagnosis not present

## 2018-01-19 DIAGNOSIS — I5022 Chronic systolic (congestive) heart failure: Secondary | ICD-10-CM | POA: Diagnosis not present

## 2018-01-19 DIAGNOSIS — K7581 Nonalcoholic steatohepatitis (NASH): Secondary | ICD-10-CM | POA: Diagnosis present

## 2018-01-19 DIAGNOSIS — J189 Pneumonia, unspecified organism: Secondary | ICD-10-CM | POA: Diagnosis not present

## 2018-01-19 DIAGNOSIS — D72829 Elevated white blood cell count, unspecified: Secondary | ICD-10-CM | POA: Diagnosis not present

## 2018-01-19 DIAGNOSIS — I872 Venous insufficiency (chronic) (peripheral): Secondary | ICD-10-CM | POA: Diagnosis present

## 2018-01-19 DIAGNOSIS — G9341 Metabolic encephalopathy: Secondary | ICD-10-CM | POA: Diagnosis not present

## 2018-01-19 DIAGNOSIS — J80 Acute respiratory distress syndrome: Secondary | ICD-10-CM | POA: Diagnosis not present

## 2018-01-19 DIAGNOSIS — R739 Hyperglycemia, unspecified: Secondary | ICD-10-CM | POA: Diagnosis not present

## 2018-01-19 DIAGNOSIS — I5043 Acute on chronic combined systolic (congestive) and diastolic (congestive) heart failure: Secondary | ICD-10-CM | POA: Diagnosis not present

## 2018-01-19 DIAGNOSIS — K7460 Unspecified cirrhosis of liver: Secondary | ICD-10-CM | POA: Diagnosis present

## 2018-01-19 DIAGNOSIS — Z9119 Patient's noncompliance with other medical treatment and regimen: Secondary | ICD-10-CM

## 2018-01-19 DIAGNOSIS — I4891 Unspecified atrial fibrillation: Secondary | ICD-10-CM | POA: Diagnosis not present

## 2018-01-19 DIAGNOSIS — Z66 Do not resuscitate: Secondary | ICD-10-CM | POA: Diagnosis not present

## 2018-01-19 DIAGNOSIS — K37 Unspecified appendicitis: Secondary | ICD-10-CM | POA: Insufficient documentation

## 2018-01-19 DIAGNOSIS — N183 Chronic kidney disease, stage 3 unspecified: Secondary | ICD-10-CM | POA: Diagnosis present

## 2018-01-19 DIAGNOSIS — Z961 Presence of intraocular lens: Secondary | ICD-10-CM | POA: Diagnosis present

## 2018-01-19 DIAGNOSIS — Z4659 Encounter for fitting and adjustment of other gastrointestinal appliance and device: Secondary | ICD-10-CM

## 2018-01-19 DIAGNOSIS — D696 Thrombocytopenia, unspecified: Secondary | ICD-10-CM | POA: Diagnosis not present

## 2018-01-19 DIAGNOSIS — Z0189 Encounter for other specified special examinations: Secondary | ICD-10-CM

## 2018-01-19 DIAGNOSIS — D509 Iron deficiency anemia, unspecified: Secondary | ICD-10-CM | POA: Diagnosis present

## 2018-01-19 DIAGNOSIS — A419 Sepsis, unspecified organism: Secondary | ICD-10-CM | POA: Diagnosis present

## 2018-01-19 DIAGNOSIS — J9601 Acute respiratory failure with hypoxia: Secondary | ICD-10-CM | POA: Diagnosis not present

## 2018-01-19 DIAGNOSIS — M79671 Pain in right foot: Secondary | ICD-10-CM | POA: Diagnosis not present

## 2018-01-19 DIAGNOSIS — E875 Hyperkalemia: Secondary | ICD-10-CM | POA: Diagnosis not present

## 2018-01-19 DIAGNOSIS — N179 Acute kidney failure, unspecified: Secondary | ICD-10-CM | POA: Diagnosis present

## 2018-01-19 DIAGNOSIS — R6521 Severe sepsis with septic shock: Secondary | ICD-10-CM | POA: Diagnosis not present

## 2018-01-19 DIAGNOSIS — E43 Unspecified severe protein-calorie malnutrition: Secondary | ICD-10-CM | POA: Diagnosis present

## 2018-01-19 DIAGNOSIS — N7091 Salpingitis, unspecified: Secondary | ICD-10-CM | POA: Diagnosis present

## 2018-01-19 DIAGNOSIS — Z515 Encounter for palliative care: Secondary | ICD-10-CM | POA: Diagnosis not present

## 2018-01-19 DIAGNOSIS — R6510 Systemic inflammatory response syndrome (SIRS) of non-infectious origin without acute organ dysfunction: Secondary | ICD-10-CM

## 2018-01-19 LAB — URINALYSIS, ROUTINE W REFLEX MICROSCOPIC
BILIRUBIN URINE: NEGATIVE
Glucose, UA: NEGATIVE mg/dL
HGB URINE DIPSTICK: NEGATIVE
Ketones, ur: NEGATIVE mg/dL
Nitrite: NEGATIVE
PH: 5 (ref 5.0–8.0)
Protein, ur: NEGATIVE mg/dL
SPECIFIC GRAVITY, URINE: 1.012 (ref 1.005–1.030)

## 2018-01-19 LAB — LIPASE, BLOOD: Lipase: 46 U/L (ref 11–51)

## 2018-01-19 LAB — COMPREHENSIVE METABOLIC PANEL
ALK PHOS: 69 U/L (ref 38–126)
ALT: 17 U/L (ref 14–54)
AST: 40 U/L (ref 15–41)
Albumin: 2.5 g/dL — ABNORMAL LOW (ref 3.5–5.0)
Anion gap: 12 (ref 5–15)
BILIRUBIN TOTAL: 1.5 mg/dL — AB (ref 0.3–1.2)
BUN: 27 mg/dL — ABNORMAL HIGH (ref 6–20)
CALCIUM: 8.2 mg/dL — AB (ref 8.9–10.3)
CHLORIDE: 92 mmol/L — AB (ref 101–111)
CO2: 24 mmol/L (ref 22–32)
CREATININE: 1.88 mg/dL — AB (ref 0.44–1.00)
GFR, EST AFRICAN AMERICAN: 27 mL/min — AB (ref >60)
GFR, EST NON AFRICAN AMERICAN: 24 mL/min — AB (ref >60)
Glucose, Bld: 141 mg/dL — ABNORMAL HIGH (ref 65–99)
Potassium: 3.6 mmol/L (ref 3.5–5.1)
Sodium: 128 mmol/L — ABNORMAL LOW (ref 135–145)
TOTAL PROTEIN: 6.7 g/dL (ref 6.5–8.1)

## 2018-01-19 LAB — CBC
HCT: 29.4 % — ABNORMAL LOW (ref 36.0–46.0)
Hemoglobin: 10 g/dL — ABNORMAL LOW (ref 12.0–15.0)
MCH: 35.1 pg — ABNORMAL HIGH (ref 26.0–34.0)
MCHC: 34 g/dL (ref 30.0–36.0)
MCV: 103.2 fL — ABNORMAL HIGH (ref 78.0–100.0)
Platelets: 194 10*3/uL (ref 150–400)
RBC: 2.85 MIL/uL — AB (ref 3.87–5.11)
RDW: 13.3 % (ref 11.5–15.5)
WBC: 26.5 10*3/uL — AB (ref 4.0–10.5)

## 2018-01-19 LAB — AMMONIA: Ammonia: 17 umol/L (ref 9–35)

## 2018-01-19 LAB — LACTIC ACID, PLASMA: LACTIC ACID, VENOUS: 1.9 mmol/L (ref 0.5–1.9)

## 2018-01-19 LAB — PROCALCITONIN: Procalcitonin: 24.83 ng/mL

## 2018-01-19 LAB — PROTIME-INR
INR: 5.98 — AB
Prothrombin Time: 53 seconds — ABNORMAL HIGH (ref 11.4–15.2)

## 2018-01-19 LAB — APTT: aPTT: 98 seconds — ABNORMAL HIGH (ref 24–36)

## 2018-01-19 LAB — DIGOXIN LEVEL: Digoxin Level: 0.3 ng/mL — ABNORMAL LOW (ref 0.8–2.0)

## 2018-01-19 MED ORDER — LOPERAMIDE HCL 2 MG PO CAPS: 2.0000 mg | ORAL_CAPSULE | ORAL | Status: DC | PRN

## 2018-01-19 MED ORDER — TRAZODONE HCL 50 MG PO TABS
100.0000 mg | ORAL_TABLET | Freq: Every day | ORAL | Status: DC
  Administered 2018-01-19 – 2018-01-30 (×12): 100 mg via ORAL
  Filled 2018-01-19 (×9): qty 1
  Filled 2018-01-19: qty 2
  Filled 2018-01-19 (×2): qty 1

## 2018-01-19 MED ORDER — DIGOXIN 125 MCG PO TABS
0.1250 mg | ORAL_TABLET | Freq: Every day | ORAL | Status: DC
  Administered 2018-01-20 – 2018-01-24 (×5): 0.125 mg via ORAL
  Filled 2018-01-19 (×5): qty 1

## 2018-01-19 MED ORDER — METOPROLOL TARTRATE 5 MG/5ML IV SOLN
2.5000 mg | Freq: Three times a day (TID) | INTRAVENOUS | Status: DC
  Administered 2018-01-19 – 2018-02-06 (×47): 2.5 mg via INTRAVENOUS
  Filled 2018-01-19 (×49): qty 5

## 2018-01-19 MED ORDER — MORPHINE SULFATE (PF) 4 MG/ML IV SOLN
0.5000 mg | INTRAVENOUS | Status: DC | PRN
  Administered 2018-01-20 – 2018-01-21 (×5): 0.52 mg via INTRAVENOUS
  Filled 2018-01-19 (×5): qty 1

## 2018-01-19 MED ORDER — SODIUM CHLORIDE 0.9 % IV BOLUS
1000.0000 mL | Freq: Once | INTRAVENOUS | Status: AC
  Administered 2018-01-19: 1000 mL via INTRAVENOUS

## 2018-01-19 MED ORDER — ACETAMINOPHEN 650 MG RE SUPP: 650.0000 mg | Freq: Four times a day (QID) | RECTAL | Status: DC | PRN

## 2018-01-19 MED ORDER — MORPHINE SULFATE (PF) 4 MG/ML IV SOLN
4.0000 mg | Freq: Once | INTRAVENOUS | Status: AC
  Administered 2018-01-19: 4 mg via INTRAVENOUS
  Filled 2018-01-19: qty 1

## 2018-01-19 MED ORDER — ONDANSETRON HCL 4 MG/2ML IJ SOLN
4.0000 mg | Freq: Once | INTRAMUSCULAR | Status: AC
  Administered 2018-01-19: 4 mg via INTRAVENOUS
  Filled 2018-01-19: qty 2

## 2018-01-19 MED ORDER — ONDANSETRON 4 MG PO TBDP
4.0000 mg | ORAL_TABLET | Freq: Once | ORAL | Status: DC | PRN
  Filled 2018-01-19: qty 1

## 2018-01-19 MED ORDER — ZOLPIDEM TARTRATE 5 MG PO TABS
5.0000 mg | ORAL_TABLET | Freq: Every evening | ORAL | Status: DC | PRN
  Administered 2018-01-21: 5 mg via ORAL
  Filled 2018-01-19 (×2): qty 1

## 2018-01-19 MED ORDER — PIPERACILLIN-TAZOBACTAM IN DEX 2-0.25 GM/50ML IV SOLN
2.2500 g | Freq: Four times a day (QID) | INTRAVENOUS | Status: DC
  Administered 2018-01-20: 2.25 g via INTRAVENOUS
  Filled 2018-01-19 (×4): qty 50

## 2018-01-19 MED ORDER — FERROUS SULFATE 325 (65 FE) MG PO TABS
325.0000 mg | ORAL_TABLET | Freq: Every day | ORAL | Status: DC
  Administered 2018-01-20: 325 mg via ORAL
  Filled 2018-01-19: qty 1

## 2018-01-19 MED ORDER — RIFAXIMIN 550 MG PO TABS
550.0000 mg | ORAL_TABLET | Freq: Two times a day (BID) | ORAL | Status: DC
  Administered 2018-01-19 – 2018-01-31 (×24): 550 mg via ORAL
  Filled 2018-01-19 (×26): qty 1

## 2018-01-19 MED ORDER — PIPERACILLIN-TAZOBACTAM 3.375 G IVPB 30 MIN
3.3750 g | Freq: Once | INTRAVENOUS | Status: AC
  Administered 2018-01-19: 3.375 g via INTRAVENOUS
  Filled 2018-01-19: qty 50

## 2018-01-19 MED ORDER — HYDRALAZINE HCL 20 MG/ML IJ SOLN: 5.0000 mg | INTRAMUSCULAR | Status: DC | PRN

## 2018-01-19 MED ORDER — ONDANSETRON HCL 4 MG/2ML IJ SOLN
4.0000 mg | Freq: Three times a day (TID) | INTRAMUSCULAR | Status: DC | PRN
  Administered 2018-01-24 – 2018-02-04 (×4): 4 mg via INTRAVENOUS
  Filled 2018-01-19 (×5): qty 2

## 2018-01-19 MED ORDER — ACETAMINOPHEN 325 MG PO TABS
650.0000 mg | ORAL_TABLET | Freq: Four times a day (QID) | ORAL | Status: DC | PRN
  Administered 2018-01-21 – 2018-01-31 (×2): 650 mg via ORAL
  Filled 2018-01-19 (×2): qty 2

## 2018-01-19 NOTE — ED Provider Notes (Signed)
[facility] EMERGENCY DEPARTMENT Provider Note   CSN: [id number] Arrival date & time: 01/16/2018  1501     History   Chief Complaint Chief Complaint  Patient presents with  . Abdominal Pain  . Diarrhea  . Fever    HPI [name] Chung is a 84 y.o. female.  HPI   [name]. Chung is an 84yo female with a history of atrial fibrillation (on Coumadin), rheumatic heart disease, CHF (EF 33-29%), non-alcoholic cirrhosis who presents to the emergency department for evaluation of abdominal pain, diarrhea and vomiting.  Patient reports that her abdominal pain began 2 days ago.  Pain is located in the periumbilical area but radiates to bilateral left and right abdomen.  Pain has been gradually worsening.  It is 10/10 in severity and feels "sharp" in nature.  Pain comes and goes, but seems to worsen when she moves in any way.  Last night she reports she was in the fetal position due to pain.  She also states that she has had nonbloody diarrhea for the past 2 days.  She states that she had bowel incontinence 6 times overnight.  She has vomited "white foam" after taking her potassium medicine the past 2 days.  She was able to eat toast and strawberries earlier today and held them down.  She recently was treated for a urinary tract infection with ciprofloxacin, finished antibiotics 2 days ago.  She states that she had a fever of 101 F at home 2 days ago.  She reports that she has no appetite.  Feels as if her abdomen is distended.  Reports chronic cough, unchanged from baseline. Reports that she has leg swelling chronically, takes fluid pill.  Denies melenaheadache, numbness, weakness, chest pain, shortness of breath, dysuria, urinary frequency, flank pain, lightheadedness, syncope. Denies previous abdominal surgeries.   Past Medical History:  Diagnosis Date  . Aortic insufficiency   . Arthritis   . Chronic anticoagulation   . Chronic atrial fibrillation ([place])   . Edema   . Rheumatic heart  disease   . Valvular heart disease     Patient Active Problem List   Diagnosis Date Noted  . Edema 03/06/2014  . Encounter for therapeutic drug monitoring 10/31/2013  . S/P MVR (mitral valve replacement) 09/14/2013  . S/P AVR (aortic valve replacement) 09/14/2013  . Heart valve replaced by other means 02/03/2011  . Long term current use of anticoagulant therapy 02/03/2011  . Aortic valve disorders 01/06/2011  . Elevated BP 01/06/2011  . Valvular heart disease   . Chronic atrial fibrillation ([place])   . Chronic anticoagulation   . Rheumatic heart disease   . Aortic insufficiency   . Arthritis     Past Surgical History:  Procedure Laterality Date  . ABDOMINAL HYSTERECTOMY    . AORTIC VALVE REPLACEMENT     1982, redo 1995 homograft, redo 2012 Medronic valve conduit-freestyle  . BACK SURGERY    . CATARACT EXTRACTION W/ INTRAOCULAR LENS  IMPLANT, BILATERAL    . MITRAL VALVE REPLACEMENT     1982, redo 1995 St Jude  valve     OB History   None      Home Medications    Prior to Admission medications   Medication Sig Start Date End Date Taking? Authorizing Provider  digoxin (LANOXIN) 0.125 MG tablet TAKE ONE-HALF TABLET BY  MOUTH DAILY 12/28/17   Martinique, Peter M, MD  fluorouracil (EFUDEX) 5 % cream  11/12/17   [provider]  furosemide (LASIX) 20  MG tablet TAKE 2 TABLETS BY MOUTH TWO TIMES DAILY 12/28/17   [place], Peter M, MD  hydrochlorothiazide (HYDRODIURIL) 50 MG tablet Take 1 tablet (50 mg total) by mouth daily. 10/25/17   [place], Peter M, MD  hydroxypropyl methylcellulose / hypromellose (ISOPTO TEARS / GONIOVISC) 2.5 % ophthalmic solution Place 1 drop into both eyes at bedtime.    [provider]  metolazone (ZAROXOLYN) 2.5 MG tablet TAKE 1 TABLET BY MOUTH  TWICE WEEKLY 12/28/17   [place], Peter M, MD  metolazone (ZAROXOLYN) 2.5 MG tablet Take 2.5 mg once a week 12/28/17   Martinique, Peter M, MD  mupirocin ointment Drue Stager) 2 %  11/16/17   [provider]  potassium chloride (K-DUR) 10 MEQ tablet Take 6 tablets ( 60 meq ) three times a day 12/28/17   Martin[place], Peter M, MD  Propylene Glycol 0.6 % SOLN Place 1 drop into both eyes 2 (two) times daily.    [provider]  traZODone (DESYREL) 50 MG tablet Take 50 mg by mouth at bedtime.    [provider]  warfarin (COUMADIN) 5 MG tablet TAKE 1 TO 2 TABLETS BY  MOUTH EVERY DAY AS DIRECTED BY COUMADIN CLINIC 12/28/17   Martin[place], Peter M, MD    Family History Family History  Problem Relation Age of Onset  . Heart attack Mother   . Stroke Mother   . Diabetes Mother   . Other Father        [place] History Social History   Tobacco Use  . Smoking status: Never Smoker  . Smokeless tobacco: Never Used  Substance Use Topics  . Alcohol use: No  . Drug use: No     Allergies   Asa buff (mag [buffered aspirin]; Codeine; Mirtazapine; and Ultram [tramadol hcl]   Review of Systems Review of Systems  Constitutional: Positive for appetite change (loss of appetite), chills and fever.  HENT: Negative for congestion and sore throat.   Eyes: Negative for visual disturbance.  Respiratory: Positive for cough (chronic, unchanged from baseline). Negative for shortness of breath and wheezing.   Cardiovascular: Positive for leg swelling (chronic). Negative for chest pain.  Gastrointestinal: Positive for abdominal distention, abdominal pain (periumbilical), diarrhea and vomiting. Negative for blood in stool.  Genitourinary: Negative for difficulty urinating, dysuria, flank pain and frequency.  Musculoskeletal: Negative for back pain.  Skin: Negative for rash.  Neurological: Negative for weakness, light-headedness, numbness and headaches.  Psychiatric/Behavioral: Negative for agitation.     Physical Exam Updated Vital Signs BP (!) 142/60 (BP Location: Right Arm)   Pulse (!) 121   Temp 99.8 F (37.7 C) (Oral)   Resp (!) 28   Wt 51.7 kg (114 lb)   SpO2 92%   BMI  18.68 kg/m   Physical Exam  Constitutional: She is oriented to person, place, and time. She appears well-developed and well-nourished. No distress.  Appears to be in pain, holding her abdomen.   HENT:  Head: Normocephalic and atraumatic.  Mouth/Throat: No oropharyngeal exudate.  Mucous membranes dry.  Eyes: Pupils are equal, round, and reactive to light. Conjunctivae are normal. Right eye exhibits no discharge. Left eye exhibits no discharge.  Neck: Normal range of motion. Neck supple. No JVD present. No tracheal deviation present.  Cardiovascular:  Tachycardic, irregularly irregular rhythm.  3/6 systolic murmur heard best at the left upper sternal border.  DP and radial pulses 2+ bilaterally.  Pulmonary/Chest: Effort normal and breath sounds normal. No stridor. No  respiratory distress. She has no wheezes. She has no rales.  Abdominal:  Abdomen soft.  Patient exquisitely tender generally over the abdomen with guarding. Worst pain in the periumbilical area.   Musculoskeletal:  Bilateral 2+ pitting edema to the mid anterior shin.  Neurological: She is alert and oriented to person, place, and time. Coordination normal.  Skin: Skin is warm and dry. She is not diaphoretic.  Psychiatric: She has a normal mood and affect. Her behavior is normal.  Nursing note and vitals reviewed.    ED Treatments / Results  Labs (all labs ordered are listed, but only abnormal results are displayed) Labs Reviewed  COMPREHENSIVE METABOLIC PANEL - Abnormal; Notable for the following components:      Result Value   Sodium 128 (*)    Chloride 92 (*)    Glucose, Bld 141 (*)    BUN 27 (*)    Creatinine, Ser 1.88 (*)    Calcium 8.2 (*)    Albumin 2.5 (*)    Total Bilirubin 1.5 (*)    GFR calc non Af Amer 24 (*)    GFR calc Af Amer 27 (*)    All other components within normal limits  CBC - Abnormal; Notable for the following components:   WBC 26.5 (*)    RBC 2.85 (*)    Hemoglobin 10.0 (*)    HCT 29.4  (*)    MCV 103.2 (*)    MCH 35.1 (*)    All other components within normal limits  URINALYSIS, ROUTINE W REFLEX MICROSCOPIC - Abnormal; Notable for the following components:   Leukocytes, UA TRACE (*)    Bacteria, UA RARE (*)    Squamous Epithelial / LPF 0-5 (*)    All other components within normal limits  C DIFFICILE QUICK SCREEN W PCR REFLEX  LIPASE, BLOOD  PROTIME-INR    EKG EKG Interpretation  Date/Time:  Wednesday January 19 2018 15:46:14 EDT Ventricular Rate:  132 PR Interval:    QRS Duration: 80 QT Interval:  302 QTC Calculation: 447 R Axis:   95 Text Interpretation:  Atrial fibrillation with rapid ventricular response Rightward axis Nonspecific ST and T wave abnormality Abnormal ECG Confirmed by Nat Christen [phone]) on 01/27/2018 8:37:58 PM   Radiology Ct Abdomen Pelvis Wo Contrast  Result Date: 01/28/2018 CLINICAL DATA:  Abdominal distention with nausea vomiting diarrhea. Weight loss EXAM: CT ABDOMEN AND PELVIS WITHOUT CONTRAST TECHNIQUE: Multidetector CT imaging of the abdomen and pelvis was performed following the standard protocol without IV contrast. COMPARISON:  CT abdomen pelvis 07/20/2017 FINDINGS: Lower chest: Small to moderate right effusion with progression. Right lower lobe atelectasis with progression. No significant left effusion. Cardiac enlargement Hepatobiliary: Enlargement left lobe liver with irregular contour liver suggesting cirrhosis. Multiple gallstones without gallbladder wall thickening or biliary dilatation Pancreas: Negative Spleen: Negative Adrenals/Urinary Tract: Negative for renal stone or obstruction. Normal urinary bladder. No renal mass on unenhanced images. Stomach/Bowel: Small bowel is mildly dilated with air-fluid levels. Colon decompressed. Dilated varices in the left upper quadrant unchanged from the prior study likely due to portal hypertension. No bowel edema Vascular/Lymphatic: Atherosclerotic disease without aneurysm of the aorta  Reproductive: Hysterectomy.  No pelvic mass Other: Negative for ascites. Musculoskeletal: No acute skeletal abnormality. IMPRESSION: 1. Findings consistent with cirrhosis and portal hypertension with varices. No ascites. 2. Cholelithiasis without cholecystitis 3. Mild small bowel dilatation with air-fluid levels which may be due to partial small bowel obstruction. Colon decompressed. Electronically Signed   By: Franchot Gallo  M.D.   On: 01/21/2018 20:14    Procedures Procedures (including critical care time)  Medications Ordered in ED Medications  sodium chloride 0.9 % bolus 1,000 mL (has no administration in time range)  sodium chloride 0.9 % bolus 1,000 mL (0 mLs Intravenous Stopped 01/23/2018 1945)  morphine 4 MG/ML injection 4 mg (4 mg Intravenous Given 01/10/2018 1856)  ondansetron (ZOFRAN) injection 4 mg (4 mg Intravenous Given 01/25/2018 1855)     Initial Impression / Assessment and Plan / ED Course  I have reviewed the triage vital signs and the nursing notes.  Pertinent labs & imaging results that were available during my care of the patient were reviewed by me and considered in my medical decision making (see chart for details).    Presents from her PCP office for evaluation of worsening periumbilical abdominal pain with associated diarrhea and vomiting.  Was recently treated for a UTI and finished ciprofloxacin 2 days ago.  She has no prior history of abdominal surgeries.  On exam she appears distressed and in pain, holding her abdomen.  She appears dehydrated with dry mucous membranes.  She is tachycardic, irregularly irregular rhythm (has a history of A. Fib.)  No fever.  Her abdomen is exquisitely tender in the periumbilical area with guarding.  No rebound.  She has bilateral lower extremity edema, states this is a normal amount of swelling with her CHF.  Labs reviewed.  Notably she has a white blood cell count of 26.5.  She also is hyponatremic with sodium of 128, likely related to her  dehydration.  1L fluid bolus started.  She also has an AK I with creatinine of 1.88 (at baseline 0.95).  This is likely prerenal, given patient's dehydration.  Her UA does not appear infected, she does have hyaline casts present.  Lipase negative.  Liver enzymes within normal limits.  Cdiff testing ordered, patient has not had bowel movement since she's been here.  Her EKG shows A. fib with RVR.  Believe that her tachycardia is related to dehydration, will hold off on rate control of her A. fib for now.  Concern for bowel obstruction versus perforated viscus given exam findings.  Discussed this patient with Dr. Lacinda Axon who also saw the patient and agrees with plan to get CT abdomen pelvis without contrast given patient's elevated creatinine.    CT shows findings consistent with cirrhosis and portal hypertension, no ascites.  It also shows small bowel dilation with air-fluid levels consistent with partial bowel obstruction.  Spoke to Dr. Donne Hazel with General Surgery about this patient who will consult on patient, suggests general medicine admission and bowel rest. Spoke with Hospitalist Dr. Blaine Hamper who will accept patient to general medicine. Patient informed   Final Clinical Impressions(s) / ED Diagnoses   Final diagnoses:  None    ED Discharge Orders    None       Bernarda Caffey 01/30/2018 2150    Nat Christen, MD 01/20/18 1755

## 2018-01-19 NOTE — Progress Notes (Addendum)
[place] for heparin Indication: atrial fibrillation and mechanical valve  Heparin Dosing Weight: 51.7 kg  Labs: Recent Labs    01/31/2018 1544  HGB 10.0*  HCT 29.4*  PLT 194  CREATININE 1.88*    Assessment: 84 yof on warfarin PTA for afib and mechanical valve. Pharmacy consulted to dose heparin. INR pending on admit, Hg 10, plt wnl. No bleed documented. Last dose of warfarin 4/17 per med rec.   Goal of Therapy:  INR 2.5-3.5 Monitor platelets by anticoagulation protocol: Yes   Plan:  F/u Stat INR - Heparin when INR<2.5 Monitor CBC, s/sx bleeding  Elicia Lamp, PharmD, BCPS Clinical Pharmacist 01/25/2018 10:23 PM  Addum:  INR 5.98.  F/u am labs Excell Seltzer, PharmD

## 2018-01-19 NOTE — ED Triage Notes (Signed)
Pt presents with periumbilical abd pain since Monday with abd distention and tenderness; pt also reporting n/v/d and decreased appetite with weight loss; pt states she saw her PCP and had UA and C+S, given antibiotics (cipro), C+S came back negative so she stopped the Cipro; pt noted to be tachycardic

## 2018-01-19 NOTE — Consult Note (Signed)
Reason for Consult:abd pain Referring Physician: Dr Jason Fila [name] Chung is an 84 y.o. female.  HPI: 14 yf who has multiple medical issues presents with abd pain. She apparently was seen by Dr Felipa Eth last week with elevated wbc and some nausea. Was thought to have uti.  Was treated with cipro.  The last 48 hours she was worsening diarrhea and some n/v. She is eating some.  Has had a normal bm.  The diarrhea has been copious.  She has no prior abd surgery even though it does say in chart she has tah.  She and her stepdaughter both say she has no prior surgery.  It does appear on her ct that she has had hyst though. She has been seen by Dr Paulita Fujita of GI for cirrhosis and treated. She is much better. It appears this may be related to her significant cardiac disease.  I was asked to see her after ct showed possible partial sbo per er and radiology.  Past Medical History:  Diagnosis Date  . Aortic insufficiency   . Arthritis   . Chronic anticoagulation   . Chronic atrial fibrillation ([place])   . Edema   . Rheumatic heart disease   . Valvular heart disease     Past Surgical History:  Procedure Laterality Date  . ABDOMINAL HYSTERECTOMY    . AORTIC VALVE REPLACEMENT     1982, redo 1995 homograft, redo 2012 Medronic valve conduit-freestyle  . BACK SURGERY    . CATARACT EXTRACTION W/ INTRAOCULAR LENS  IMPLANT, BILATERAL    . MITRAL VALVE REPLACEMENT     1982, redo 1995 St Jude  valve    Family History  Problem Relation Age of Onset  . Heart attack Mother   . Stroke Mother   . Diabetes Mother   . Other Father        CAR ACCIDENT    Social History:  reports that she has never smoked. She has never used smokeless tobacco. She reports that she does not drink alcohol or use drugs.  Allergies:  Allergies  Allergen Reactions  . Asa Buff (Mag [Buffered Aspirin] Other (See Comments)    On coumadin  . Codeine   . Mirtazapine Nausea Only  . Ultram [Tramadol Hcl]     Medications: reviewed  include coumadin  Results for orders placed or performed during the hospital encounter of 01/27/2018 (from the past 48 hour(s))  Urinalysis, Routine w reflex microscopic     Status: Abnormal   Collection Time: 01/16/2018  3:38 PM  Result Value Ref Range   Color, Urine YELLOW YELLOW   APPearance CLEAR CLEAR   Specific Gravity, Urine 1.012 1.005 - 1.030   pH 5.0 5.0 - 8.0   Glucose, UA NEGATIVE NEGATIVE mg/dL   Hgb urine dipstick NEGATIVE NEGATIVE   Bilirubin Urine NEGATIVE NEGATIVE   Ketones, ur NEGATIVE NEGATIVE mg/dL   Protein, ur NEGATIVE NEGATIVE mg/dL   Nitrite NEGATIVE NEGATIVE   Leukocytes, UA TRACE (A) NEGATIVE   RBC / HPF 0-5 0 - 5 RBC/hpf   WBC, UA 6-30 0 - 5 WBC/hpf   Bacteria, UA RARE (A) NONE SEEN   Squamous Epithelial / LPF 0-5 (A) NONE SEEN   Mucus PRESENT    Hyaline Casts, UA PRESENT     Comment: Performed at [facility], [address] [place], [place] [address]  Lipase, blood     Status: None   Collection Time: 01/09/2018  3:44 PM  Result Value Ref Range  Lipase 46 11 - 51 U/L    Comment: Performed at [facility], [place] [address] [place], [place] [address]  Comprehensive metabolic panel     Status: Abnormal   Collection Time: 01/28/2018  3:44 PM  Result Value Ref Range   Sodium 128 (L) 135 - 145 mmol/L   Potassium 3.6 3.5 - 5.1 mmol/L   Chloride 92 (L) 101 - 111 mmol/L   CO2 24 22 - 32 mmol/L   Glucose, Bld 141 (H) 65 - 99 mg/dL   BUN 27 (H) 6 - 20 mg/dL   Creatinine, Ser 1.88 (H) 0.44 - 1.00 mg/dL   Calcium 8.2 (L) 8.9 - 10.3 mg/dL   Total Protein 6.7 6.5 - 8.1 g/dL   Albumin 2.5 (L) 3.5 - 5.0 g/dL   AST 40 15 - 41 U/L   ALT 17 14 - 54 U/L   Alkaline Phosphatase 69 38 - 126 U/L   Total Bilirubin 1.5 (H) 0.3 - 1.2 mg/dL   GFR calc non Af Amer 24 (L) >60 mL/min   GFR calc Af Amer 27 (L) >60 mL/min    Comment: (NOTE) The eGFR has been calculated using the CKD EPI equation. This calculation has not been validated in all clinical  situations. eGFR's persistently <60 mL/min signify possible Chronic Kidney Disease.    Anion gap 12 5 - 15    Comment: Performed at [facility] [address], [place], [place] [address]  CBC     Status: Abnormal   Collection Time: 01/21/2018  3:44 PM  Result Value Ref Range   WBC 26.5 (H) 4.0 - 10.5 K/uL   RBC 2.85 (L) 3.87 - 5.11 MIL/uL   Hemoglobin 10.0 (L) 12.0 - 15.0 g/dL   HCT 29.4 (L) 36.0 - 46.0 %   MCV 103.2 (H) 78.0 - 100.0 fL   MCH 35.1 (H) 26.0 - 34.0 pg   MCHC 34.0 30.0 - 36.0 g/dL   RDW 13.3 11.5 - 15.5 %   Platelets 194 150 - 400 K/uL    Comment: Performed at [facility], [place] [address] [place], [place] [address]    Ct Abdomen Pelvis Wo Contrast  Result Date: 01/09/2018 CLINICAL DATA:  Abdominal distention with nausea vomiting diarrhea. Weight loss EXAM: CT ABDOMEN AND PELVIS WITHOUT CONTRAST TECHNIQUE: Multidetector CT imaging of the abdomen and pelvis was performed following the standard protocol without IV contrast. COMPARISON:  CT abdomen pelvis 07/20/2017 FINDINGS: Lower chest: Small to moderate right effusion with progression. Right lower lobe atelectasis with progression. No significant left effusion. Cardiac enlargement Hepatobiliary: Enlargement left lobe liver with irregular contour liver suggesting cirrhosis. Multiple gallstones without gallbladder wall thickening or biliary dilatation Pancreas: Negative Spleen: Negative Adrenals/Urinary Tract: Negative for renal stone or obstruction. Normal urinary bladder. No renal mass on unenhanced images. Stomach/Bowel: Small bowel is mildly dilated with air-fluid levels. Colon decompressed. Dilated varices in the left upper quadrant unchanged from the prior study likely due to portal hypertension. No bowel edema Vascular/Lymphatic: Atherosclerotic disease without aneurysm of the aorta Reproductive: Hysterectomy.  No pelvic mass Other: Negative for ascites. Musculoskeletal: No acute skeletal abnormality. IMPRESSION:  1. Findings consistent with cirrhosis and portal hypertension with varices. No ascites. 2. Cholelithiasis without cholecystitis 3. Mild small bowel dilatation with air-fluid levels which may be due to partial small bowel obstruction. Colon decompressed. Electronically Signed   By: Franchot Gallo M.D.   On: 01/24/2018 20:14    Review of Systems  Constitutional: Positive  for malaise/fatigue.  Gastrointestinal: Positive for abdominal pain, diarrhea, nausea and vomiting.  All other systems reviewed and are negative.  Blood pressure (!) 116/52, pulse (!) 121, temperature 99.8 F (37.7 C), temperature source Oral, resp. rate 20, weight 51.7 kg (114 lb), SpO2 91 %. Physical Exam  Constitutional: She is oriented to person, place, and time. She appears cachectic.  HENT:  Head: Normocephalic and atraumatic.  Eyes: No scleral icterus.  Neck: Neck supple. No tracheal deviation present.  Cardiovascular: Tachycardia present.  Respiratory: Effort normal and breath sounds normal.  GI: She exhibits no distension. Bowel sounds are decreased. There is generalized tenderness. No hernia.  Musculoskeletal: She exhibits edema (bilateral le).  Lymphadenopathy:    She has no cervical adenopathy.  Neurological: She is alert and oriented to person, place, and time.  Skin: Skin is warm and dry.  Psychiatric: Her behavior is normal.    Assessment/Plan: Abdominal pain  Im not sure of etiology at this point.  I dont think she needs surgery. I think surgery if required will have high mortality. Have asked er to check pt/inr.  I have also recommended checking c diff and precautions.  Clinically does not appear obstructed. Will check a film in the am. If she has continued n/v while npo (which I also recommend) would place ng once inr is back.  We will follow with you  Rolm Bookbinder 01/09/2018, 9:26 PM

## 2018-01-19 NOTE — Progress Notes (Signed)
Pharmacy Antibiotic Note  [name] Chung is a 84 y.o. female admitted on 01/10/2018 with sepsis.  Pharmacy has been consulted for zosyn dosing. Recently treated for UTI with Cipro. SCr 1.88 on admit, CrCl~18.  Plan: Zosyn 3.375g IV (40min infusion) x 1 in the ED; then 2.25g IV q6h Monitor clinical progress, c/s, renal function F/u de-escalation plan/LOT   Weight: 114 lb (51.7 kg)  Temp (24hrs), Avg:99.6 F (37.6 C), Min:99.4 F (37.4 C), Max:99.8 F (37.7 C)  Recent Labs  Lab 01/25/2018 1544  WBC 26.5*  CREATININE 1.88*    Estimated Creatinine Clearance: 18.5 mL/min (A) (by C-G formula based on SCr of 1.88 mg/dL (H)).    Allergies  Allergen Reactions  . Asa Buff (Mag [Buffered Aspirin] Other (See Comments)    On coumadin  . Codeine   . Mirtazapine Nausea Only  . Ultram [Tramadol Hcl]     Elicia Lamp, PharmD, BCPS Clinical Pharmacist 01/11/2018 10:19 PM

## 2018-01-19 NOTE — ED Notes (Signed)
Attempted IV x2. 

## 2018-01-19 NOTE — H&P (Signed)
History and Physical    VASHON ARCH EXB:[id number] DOB: November 28, 1932 DOA: 01/21/2018  Referring MD/NP/PA:   PCP: Lajean Manes, MD   Patient coming from:  The patient is coming from home.  At baseline, pt is partially dependent for most of ADL  Chief Complaint: nausea, vomiting, abdominal pain, diarrhea  HPI: [name] Chung is a 84 y.o. female with medical history significant of atrial fibrillation, AVR and MVR with mechanic valve on coumadin, liver cirrhosis, CHF, CKD-III, who presents with nausea, vomiting, abdominal pain diarrhea.  Patient states that she has been having nausea, vomiting, abdominal pain for 3 days. She also has abdominal distention. Patient was seen by PCP 2 days ago, and started ciprofloxacin presumably for UTI, but her urinalysis came back negative. She stopped Cipro. Patient states that she has been having diarrhea. Her PCP started imoodium for her and her diarrhea has improved and today. Patient denies chest pain, shortness breath, cough. No fever or chills. No symptoms of UTI currently. She has bilateral leg edema.  ED Course: pt was found to have WBC 26.8, lipase of 48, urinalysis with trace amount of leukocyte, worsening renal function, sodium 128, temperature 99.8, tachycardia, tachypnea, oxygen saturation 91-95% on room air. CT of abdomen/pelvis showed partial small bowel obstruction. Patient is admitted to stepdown as inpatient. Gen. Surgeon, Dr. Donne Hazel was consulted.  Review of Systems:   General: no fevers, chills, no body weight gain, has poor appetite, has fatigue HEENT: no blurry vision, hearing changes or sore throat Respiratory: no dyspnea, coughing, wheezing CV: no chest pain, no palpitations GI: has nausea, vomiting, abdominal pain, diarrhea, no constipation GU: no dysuria, burning on urination, increased urinary frequency, hematuria  Ext: has leg edema Neuro: no unilateral weakness, numbness, or tingling, no vision change or hearing loss Skin: no  rash, no skin tear. MSK: No muscle spasm, no deformity, no limitation of range of movement in spin Heme: No easy bruising.  Travel history: No recent long distant travel.  Allergy:  Allergies  Allergen Reactions  . Asa Buff (Mag [Buffered Aspirin] Other (See Comments)    On coumadin  . Codeine   . Mirtazapine Nausea Only  . Ultram [Tramadol Hcl]     Past Medical History:  Diagnosis Date  . Aortic insufficiency   . Arthritis   . Chronic anticoagulation   . Chronic atrial fibrillation ([place])   . Edema   . Rheumatic heart disease   . Valvular heart disease     Past Surgical History:  Procedure Laterality Date  . ABDOMINAL HYSTERECTOMY    . AORTIC VALVE REPLACEMENT     1982, redo 1995 homograft, redo 2012 Medronic valve conduit-freestyle  . BACK SURGERY    . CATARACT EXTRACTION W/ INTRAOCULAR LENS  IMPLANT, BILATERAL    . MITRAL VALVE REPLACEMENT     1982, redo 1995 St Jude  valve    Social History:  reports that she has never smoked. She has never used smokeless tobacco. She reports that she does not drink alcohol or use drugs.  Family History:  Family History  Problem Relation Age of Onset  . Heart attack Mother   . Stroke Mother   . Diabetes Mother   . Other Father        CAR ACCIDENT     Prior to Admission medications   Medication Sig Start Date End Date Taking? Authorizing Provider  digoxin (LANOXIN) 0.125 MG tablet TAKE ONE-HALF TABLET BY  MOUTH DAILY 12/28/17   [place], Peter M, MD  fluorouracil (EFUDEX) 5 % cream  11/12/17   [provider]  furosemide (LASIX) 20 MG tablet TAKE 2 TABLETS BY MOUTH TWO TIMES DAILY 12/28/17   [place], Peter M, MD  hydrochlorothiazide (HYDRODIURIL) 50 MG tablet Take 1 tablet (50 mg total) by mouth daily. 10/25/17   [place], Peter M, MD  hydroxypropyl methylcellulose / hypromellose (ISOPTO TEARS / GONIOVISC) 2.5 % ophthalmic solution Place 1 drop into both eyes at bedtime.    [provider]  metolazone (ZAROXOLYN) 2.5  MG tablet TAKE 1 TABLET BY MOUTH  TWICE WEEKLY 12/28/17   [place], Peter M, MD  metolazone (ZAROXOLYN) 2.5 MG tablet Take 2.5 mg once a week 12/28/17   [place], Peter M, MD  mupirocin ointment Drue Stager) 2 %  11/16/17   [provider]  potassium chloride (K-DUR) 10 MEQ tablet Take 6 tablets ( 60 meq ) three times a day 12/28/17   [place], Peter M, MD  Propylene Glycol 0.6 % SOLN Place 1 drop into both eyes 2 (two) times daily.    [provider]  traZODone (DESYREL) 50 MG tablet Take 50 mg by mouth at bedtime.    [provider]  warfarin (COUMADIN) 5 MG tablet TAKE 1 TO 2 TABLETS BY  MOUTH EVERY DAY AS DIRECTED BY COUMADIN CLINIC 12/28/17   [place], Peter M, MD    Physical Exam: Vitals:   01/26/2018 2115 01/27/2018 2130 01/13/2018 2200 01/15/2018 2245  BP: (!) 116/52 (!) 135/47 (!) 125/46 (!) 117/52  Pulse: (!) 121 (!) 122 (!) 118 (!) 117  Resp: 20 18 18 17   Temp:      TempSrc:      SpO2: 91% 92% 91% 92%  Weight:   51.7 kg (113 lb 15.7 oz)   Height:   5' 5.5" (1.664 m)    General: Not in acute distress HEENT:       Eyes: PERRL, EOMI, no scleral icterus.       ENT: No discharge from the ears and nose, no pharynx injection, no tonsillar enlargement.        Neck: No JVD, no bruit, no mass felt. Heme: No neck lymph node enlargement. Cardiac: S1/S2, has mechanic valve click sounds. No gallops or rubs. Respiratory: No rales, wheezing, rhonchi or rubs. GI:  distended, diffusely tender, no rebound pain, no organomegaly, BS present. GU: No hematuria Ext: 2+ pitting leg edema bilaterally. 2+DP/PT pulse bilaterally. Musculoskeletal: No joint deformities, No joint redness or warmth, no limitation of ROM in spin. Skin: No rashes.  Neuro: Alert, oriented X3, cranial nerves II-XII grossly intact, moves all extremities normally.  Psych: Patient is not psychotic, no suicidal or hemocidal ideation.  Labs on Admission: I have personally reviewed following labs and imaging  studies  CBC: Recent Labs  Lab 01/03/2018 1544  WBC 26.5*  HGB 10.0*  HCT 29.4*  MCV 103.2*  PLT 765   Basic Metabolic Panel: Recent Labs  Lab 01/17/2018 1544  NA 128*  K 3.6  CL 92*  CO2 24  GLUCOSE 141*  BUN 27*  CREATININE 1.88*  CALCIUM 8.2*   GFR: Estimated Creatinine Clearance: 18.5 mL/min (A) (by C-G formula based on SCr of 1.88 mg/dL (H)). Liver Function Tests: Recent Labs  Lab 01/18/2018 1544  AST 40  ALT 17  ALKPHOS 69  BILITOT 1.5*  PROT 6.7  ALBUMIN 2.5*   Recent Labs  Lab 01/26/2018 1544  LIPASE 46   No results for input(s): AMMONIA in the last 168 hours. Coagulation Profile: No  results for input(s): INR, PROTIME in the last 168 hours. Cardiac Enzymes: No results for input(s): CKTOTAL, CKMB, CKMBINDEX, TROPONINI in the last 168 hours. BNP (last 3 results) No results for input(s): PROBNP in the last 8760 hours. HbA1C: No results for input(s): HGBA1C in the last 72 hours. CBG: No results for input(s): GLUCAP in the last 168 hours. Lipid Profile: No results for input(s): CHOL, HDL, LDLCALC, TRIG, CHOLHDL, LDLDIRECT in the last 72 hours. Thyroid Function Tests: No results for input(s): TSH, T4TOTAL, FREET4, T3FREE, THYROIDAB in the last 72 hours. Anemia Panel: No results for input(s): VITAMINB12, FOLATE, FERRITIN, TIBC, IRON, RETICCTPCT in the last 72 hours. Urine analysis:    Component Value Date/Time   COLORURINE YELLOW 01/20/2018 1538   APPEARANCEUR CLEAR 01/10/2018 1538   APPEARANCEUR Clear 08/17/2017 1457   LABSPEC 1.012 01/29/2018 1538   PHURINE 5.0 01/23/2018 1538   GLUCOSEU NEGATIVE 01/29/2018 1538   HGBUR NEGATIVE 01/14/2018 1538   BILIRUBINUR NEGATIVE 01/23/2018 1538   BILIRUBINUR Negative 08/17/2017 1457   KETONESUR NEGATIVE 01/06/2018 1538   PROTEINUR NEGATIVE 01/13/2018 1538   NITRITE NEGATIVE 01/24/2018 1538   LEUKOCYTESUR TRACE (A) 01/30/2018 1538   LEUKOCYTESUR Negative 08/17/2017 1457   Sepsis  Labs: @LABRCNTIP (procalcitonin:4,lacticidven:4) )No results found for this or any previous visit (from the past 240 hour(s)).   Radiological Exams on Admission: Ct Abdomen Pelvis Wo Contrast  Result Date: 01/23/2018 CLINICAL DATA:  Abdominal distention with nausea vomiting diarrhea. Weight loss EXAM: CT ABDOMEN AND PELVIS WITHOUT CONTRAST TECHNIQUE: Multidetector CT imaging of the abdomen and pelvis was performed following the standard protocol without IV contrast. COMPARISON:  CT abdomen pelvis 07/20/2017 FINDINGS: Lower chest: Small to moderate right effusion with progression. Right lower lobe atelectasis with progression. No significant left effusion. Cardiac enlargement Hepatobiliary: Enlargement left lobe liver with irregular contour liver suggesting cirrhosis. Multiple gallstones without gallbladder wall thickening or biliary dilatation Pancreas: Negative Spleen: Negative Adrenals/Urinary Tract: Negative for renal stone or obstruction. Normal urinary bladder. No renal mass on unenhanced images. Stomach/Bowel: Small bowel is mildly dilated with air-fluid levels. Colon decompressed. Dilated varices in the left upper quadrant unchanged from the prior study likely due to portal hypertension. No bowel edema Vascular/Lymphatic: Atherosclerotic disease without aneurysm of the aorta Reproductive: Hysterectomy.  No pelvic mass Other: Negative for ascites. Musculoskeletal: No acute skeletal abnormality. IMPRESSION: 1. Findings consistent with cirrhosis and portal hypertension with varices. No ascites. 2. Cholelithiasis without cholecystitis 3. Mild small bowel dilatation with air-fluid levels which may be due to partial small bowel obstruction. Colon decompressed. Electronically Signed   By: Franchot Gallo M.D.   On: 01/03/2018 20:14     EKG: Independently reviewed.  Atrial fibrillation, RVR, RAD, low voltage, nonspecific T-wave change.  Assessment/Plan Principal Problem:   Partial small bowel obstruction  (HCC) Active Problems:   Chronic atrial fibrillation (HCC)   Long term current use of anticoagulant therapy   S/P MVR (mitral valve replacement)   S/P AVR (aortic valve replacement)   Acute renal failure superimposed on stage 3 chronic kidney disease (HCC)   Atrial fibrillation with RVR (HCC)   Chronic systolic (congestive) heart failure (HCC)   Hyponatremia   Sepsis (HCC)   Liver cirrhosis (HCC)   Partial small bowel obstruction ([place]): Gen. Surgeon, Dr. Donne Hazel was consulted. Patient has high-risk for surgery given her multiple comorbidities, particularly cardiac issues.   -will admit to SDU as inpt (pt has A fib with RVR, may need to start carcizem gtt) -NPO   -prn NG tube  if symptoms getting worse (not started yet) -morphine prn pain -Prn Zofran prn nausea   -IVF: 2L NS was given in ED -INR/PTT/type & screen -Follow-up general surgeons recommendation -will check c diff pcr as per with Dr. Donne Hazel recommendations  Sepsis: patient meets criteria for sepsis with leukocytosis, tachycardia and tachypnea. Pending lactic acid level. Currently hemodynamically stable. Source of infection is not clear. Differential diagnosis include bacterial translocation secondary to small bowel obstruction and UTI. Will also need to rule out C. Difficile colitis as above -start zosyn -will get Procalcitonin and trend lactic acid levels per sepsis protocol. -IVF: 2L of NS bolus in ED (patient has a congestive heart failure, limiting aggressive IV fluids treatment). -f/u Bx, Ux, UA, c diff pcr  S/P MVR and AVR with mechanic valves: pt was on coumadin. Pending INR -will switch to IV heparin now  Atrial fibrillation with RVR: HR is 110-130s. CHA2DS2-VASc Score is 4, needs oral anticoagulation. Patient is on Coumadin at home.  -will switch to IV heparin as above -continue digoxin and check digoxin level -start IV metoprolol 2.5 mg every 8 hour with holding parameters  Chronic systolic (congestive)  heart failure: 2-D echo on 07/21/11 showed EF of 45-50 percent, but her 2d echo on 08/19/17 showed EF of 60-65 percent, which has improved. Patient has 2+ leg edema, but no respiratory distress. Does not seem to have CHF exacerbation. -Hold diuretics (Lasix, metolazone, HCTZ, spironolactone) due to sepsis and hyponatremia -check BMP  AoCKD-III: Baseline Cre is 1.0-1.3, pt's Cre is 1.88 and BUN 27 on admission. Likely due to prerenal secondary to dehydration and continuation of diuretics - IVF as above - Follow up renal function by BMP - Hold diuretics  Hyponatremia: sodium 128. Mental status normal. Most likely due to combination of a nausea, vomiting and diarrhea plus use of diuretics. -Hold diuretics -IV normal saline as above -f/u by BMP - check TSH  Liver cirrhosis ([place]): mental status normal. Does not have signs of hepatic encephalopathy -continue Rifaximin -check ammonia level   DVT ppx: on IV Heparin    Code Status: Full code Family Communication: Yes, patient's  daughter  at bed side Disposition Plan:  Anticipate discharge back to previous home environment Consults called:  . Gen. Surgeon, Dr. Donne Hazel Admission status: SDU/inpation       Date of Service 01/21/2018    Ivor Costa [facility] Pager [phone]  If 7PM-7AM, please contact night-coverage [email or web address] Password [facility] 01/31/2018, 10:52 PM

## 2018-01-20 ENCOUNTER — Inpatient Hospital Stay (HOSPITAL_COMMUNITY): Payer: Medicare Other

## 2018-01-20 DIAGNOSIS — K7460 Unspecified cirrhosis of liver: Secondary | ICD-10-CM

## 2018-01-20 LAB — HEPARIN LEVEL (UNFRACTIONATED): Heparin Unfractionated: <0.1 IU/mL — ABNORMAL LOW (ref 0.30–0.70)

## 2018-01-20 LAB — BRAIN NATRIURETIC PEPTIDE: B Natriuretic Peptide: 159.9 pg/mL — ABNORMAL HIGH (ref 0.0–100.0)

## 2018-01-20 LAB — TSH: TSH: 1.467 u[IU]/mL (ref 0.350–4.500)

## 2018-01-20 LAB — BASIC METABOLIC PANEL
ANION GAP: 9 (ref 5–15)
BUN: 32 mg/dL — ABNORMAL HIGH (ref 6–20)
CALCIUM: 7.8 mg/dL — AB (ref 8.9–10.3)
CHLORIDE: 97 mmol/L — AB (ref 101–111)
CO2: 25 mmol/L (ref 22–32)
Creatinine, Ser: 1.75 mg/dL — ABNORMAL HIGH (ref 0.44–1.00)
GFR calc non Af Amer: 26 mL/min — ABNORMAL LOW (ref >60)
GFR, EST AFRICAN AMERICAN: 30 mL/min — AB (ref >60)
Glucose, Bld: 109 mg/dL — ABNORMAL HIGH (ref 65–99)
Potassium: 3.6 mmol/L (ref 3.5–5.1)
Sodium: 131 mmol/L — ABNORMAL LOW (ref 135–145)

## 2018-01-20 LAB — CBC
HCT: 27.3 % — ABNORMAL LOW (ref 36.0–46.0)
HEMOGLOBIN: 9.2 g/dL — AB (ref 12.0–15.0)
MCH: 35.4 pg — AB (ref 26.0–34.0)
MCHC: 33.7 g/dL (ref 30.0–36.0)
MCV: 105 fL — AB (ref 78.0–100.0)
Platelets: 168 10*3/uL (ref 150–400)
RBC: 2.6 MIL/uL — AB (ref 3.87–5.11)
RDW: 13.6 % (ref 11.5–15.5)
WBC: 26 10*3/uL — AB (ref 4.0–10.5)

## 2018-01-20 LAB — PROTIME-INR
INR: 6.24 — AB
PROTHROMBIN TIME: 54.8 s — AB (ref 11.4–15.2)

## 2018-01-20 LAB — CBG MONITORING, ED: GLUCOSE-CAPILLARY: 91 mg/dL (ref 65–99)

## 2018-01-20 LAB — LACTIC ACID, PLASMA: Lactic Acid, Venous: 1.1 mmol/L (ref 0.5–1.9)

## 2018-01-20 MED ORDER — PIPERACILLIN-TAZOBACTAM IN DEX 2-0.25 GM/50ML IV SOLN
2.2500 g | Freq: Four times a day (QID) | INTRAVENOUS | Status: DC
  Administered 2018-01-20 – 2018-01-22 (×8): 2.25 g via INTRAVENOUS
  Filled 2018-01-20 (×9): qty 50

## 2018-01-20 MED ORDER — SODIUM CHLORIDE 0.9 % IV SOLN
INTRAVENOUS | Status: AC
  Administered 2018-01-20 – 2018-01-21 (×2): via INTRAVENOUS

## 2018-01-20 NOTE — Progress Notes (Signed)
[facility] Progress Note     Subjective: CC:  Family member at bedside assisting with history. Patient reports central/lower abdominal pain that is cramping and non-radiating in nature, improved s/p morphine. Last BM was yesterday. Mild nausea this AM, no emesis. Mobilizes independently at baseline.  Objective: Vital signs in last 24 hours: Temp:  [99.4 F (37.4 C)-99.8 F (37.7 C)] 99.8 F (37.7 C) (04/17 1709) Pulse Rate:  [101-133] 101 (04/18 1104) Resp:  [13-28] 22 (04/18 1103) BP: (101-144)/(43-72) 115/72 (04/18 1103) SpO2:  [91 %-100 %] 100 % (04/18 1103) Weight:  [51.7 kg (113 lb 15.7 oz)-51.7 kg (114 lb)] 51.7 kg (113 lb 15.7 oz) (04/17 2200)    Intake/Output from previous day: 04/17 0701 - 04/18 0700 In: 2050 [IV Piggyback:2050] Out: 1 [Stool:1] Intake/Output this shift: Total I/O In: 100 [IV Piggyback:100] Out: -   PE: Gen:  Alert, NAD, pleasant Card:  Regular rate and rhythm, pedal pulses 2+ BL Pulm:  Normal effort, clear to auscultation bilaterally Abd: soft, mild distention, +BS, TTP lower abdomen with voluntary guarding. Skin: warm and dry, no rashes  Psych: A&Ox3   Lab Results:  Recent Labs    01/26/2018 1544 01/20/18 0500  WBC 26.5* 26.0*  HGB 10.0* 9.2*  HCT 29.4* 27.3*  PLT 194 168   BMET Recent Labs    01/30/2018 1544 01/20/18 0500  NA 128* 131*  K 3.6 3.6  CL 92* 97*  CO2 24 25  GLUCOSE 141* 109*  BUN 27* 32*  CREATININE 1.88* 1.75*  CALCIUM 8.2* 7.8*   PT/INR Recent Labs    01/20/2018 2240  LABPROT 53.0*  INR 5.98*   CMP     Component Value Date/Time   NA 131 (L) 01/20/2018 0500   NA 136 01/04/2018 1416   K 3.6 01/20/2018 0500   CL 97 (L) 01/20/2018 0500   CO2 25 01/20/2018 0500   GLUCOSE 109 (H) 01/20/2018 0500   BUN 32 (H) 01/20/2018 0500   BUN 16 01/04/2018 1416   CREATININE 1.75 (H) 01/20/2018 0500   CREATININE 0.67 03/13/2015 1419   CALCIUM 7.8 (L) 01/20/2018 0500   PROT 6.7 01/06/2018 1544   PROT  7.3 12/27/2017 1423   ALBUMIN 2.5 (L) 01/10/2018 1544   ALBUMIN 3.2 (L) 12/27/2017 1423   AST 40 01/21/2018 1544   ALT 17 01/09/2018 1544   ALKPHOS 69 01/18/2018 1544   BILITOT 1.5 (H) 01/23/2018 1544   BILITOT 0.9 12/27/2017 1423   GFRNONAA 26 (L) 01/20/2018 0500   GFRAA 30 (L) 01/20/2018 0500   Lipase     Component Value Date/Time   LIPASE 46 01/09/2018 1544       Studies/Results: Ct Abdomen Pelvis Wo Contrast  Result Date: 01/24/2018 CLINICAL DATA:  Abdominal distention with nausea vomiting diarrhea. Weight loss EXAM: CT ABDOMEN AND PELVIS WITHOUT CONTRAST TECHNIQUE: Multidetector CT imaging of the abdomen and pelvis was performed following the standard protocol without IV contrast. COMPARISON:  CT abdomen pelvis 07/20/2017 FINDINGS: Lower chest: Small to moderate right effusion with progression. Right lower lobe atelectasis with progression. No significant left effusion. Cardiac enlargement Hepatobiliary: Enlargement left lobe liver with irregular contour liver suggesting cirrhosis. Multiple gallstones without gallbladder wall thickening or biliary dilatation Pancreas: Negative Spleen: Negative Adrenals/Urinary Tract: Negative for renal stone or obstruction. Normal urinary bladder. No renal mass on unenhanced images. Stomach/Bowel: Small bowel is mildly dilated with air-fluid levels. Colon decompressed. Dilated varices in the left upper quadrant unchanged from the prior study likely due to  portal hypertension. No bowel edema Vascular/Lymphatic: Atherosclerotic disease without aneurysm of the aorta Reproductive: Hysterectomy.  No pelvic mass Other: Negative for ascites. Musculoskeletal: No acute skeletal abnormality. IMPRESSION: 1. Findings consistent with cirrhosis and portal hypertension with varices. No ascites. 2. Cholelithiasis without cholecystitis 3. Mild small bowel dilatation with air-fluid levels which may be due to partial small bowel obstruction. Colon decompressed.  Electronically Signed   By: Franchot Gallo M.D.   On: 01/13/2018 20:14    Anti-infectives: Anti-infectives (From admission, onward)   Start     Dose/Rate Route Frequency Ordered Stop   01/20/18 0500  piperacillin-tazobactam (ZOSYN) IVPB 2.25 g     2.25 g 100 mL/hr over 30 Minutes Intravenous Every 6 hours 01/14/2018 2220     01/28/2018 2330  rifaximin (XIFAXAN) tablet 550 mg     550 mg Oral 2 times daily 01/23/2018 2224     01/07/2018 2230  piperacillin-tazobactam (ZOSYN) IVPB 3.375 g     3.375 g 100 mL/hr over 30 Minutes Intravenous  Once 01/03/2018 2220 01/30/2018 2300       Assessment/Plan On coumadinn for mechanical heart valves - supratherapeutic INR, 5.98 A.fib/RVR CHF  Hyponatremia  Cirrhosis of the liver  Abdominal pain  - CT scan suggestive of possible mild pSBO, repeat AXR this AM consistent with this  - pt denies surgical Hx but chart lists prior hysterectomy - having bowel function - agree with bowel rest for now given abdominal tenderness.  - persistent nausea/vomiting with warrant NG tube    Plan: no acute surgical needs, possible pSBO unknown etiology  concerned about c.dif given diarrhea, pain, recent cipro use.   we will follow    LOS: 1 day    Jill Alexanders , [facility] 01/20/2018, 11:13 AM Pager: [phone] Consults: [phone] Mon-Fri 7:00 am-4:30 pm Sat-Sun 7:00 am-11:30 am

## 2018-01-20 NOTE — ED Notes (Signed)
Attempted report x 2 

## 2018-01-20 NOTE — Progress Notes (Signed)
PROGRESS NOTE   [name] Chung  MOQ:[id number]    DOB: [birth year]    DOA: 01/15/2018  PCP: Lajean Manes, MD   I have briefly reviewed patients previous medical records in [facility].  Brief Narrative:  84 year old married female, lives at [employer] in an apartment with her spouse who has dementia, independent, PMH of chronic A. fib, mechanical AVR & MVR on Coumadin, chronic diastolic CHF (Dr. Martinique, Cardiology), stage III chronic kidney disease, chronic lower extremity edema, hysterectomy presented to ED 01/15/2018 with 3 days history of nausea, nonbloody emesis, abdominal distention and pain for which she was seen by PCP 2 days PTA and started on Cipro for presumed UTI but UA returned negative so Cipro was stopped and also reported diarrhea.  PCP started Imodium with improvement in diarrhea.  In ED, CT abdomen and pelvis suggestive of PSBO.  General surgery was consulted and patient admitted for further evaluation and management.   Assessment & Plan:   Principal Problem:   Partial small bowel obstruction (HCC) Active Problems:   Chronic atrial fibrillation (HCC)   Long term current use of anticoagulant therapy   S/P MVR (mitral valve replacement)   S/P AVR (aortic valve replacement)   Acute renal failure superimposed on stage 3 chronic kidney disease (HCC)   Atrial fibrillation with RVR (HCC)   Chronic systolic (congestive) heart failure (HCC)   Hyponatremia   Sepsis (HCC)   Liver cirrhosis (HCC)   Partial small bowel obstruction: Unclear etiology.  History of hysterectomy.  General surgery was consulted and recommend conservative management with NPO septic meds with sips and ice chips, mobilize, monitor closely.  If has persistent nausea and vomiting, may need NG tube provided her INR is better.  No acute surgical needs seen and patient will be a high risk if surgery needed.  Nausea, vomiting and diarrhea: Possibly related to PSBO versus acute GE.  Test for C. difficile if  ongoing diarrhea but low index of suspicion.  Improved since admission.  Continue bowel rest, gentle IV fluids and advance diet when PSBO is better.  SIRS: Although patient had tachycardia, tachypnea and leukocytosis, no clear focus of infection.  Do not think that she had sepsis on admission.  Lactate normal.  Remained hemodynamically stable.  Denies urinary symptoms and UA not consistent with UTI.  PCT 24.83.  Check chest x-ray for pneumonia although not much symptoms.  C. difficile discussion as above.  Was empirically started on IV Zosyn.  If chest x-ray negative, will stop Zosyn and monitor clinically.  Chronic A. fib with RVR: CHA2DS2-VASc Score is 4.  Presented with heart rate in the 110s-130s, likely related to PSBO associated symptoms.  Continue digoxin.  Digoxin level low.  IV metoprolol 2.5 mg every 8 hours started.  Heart rates better.  INR supratherapeutic 5/98/hold Coumadin and if remains n.p.o. then transitioned to IV heparin per pharmacy until able to take by mouth.  Patient sees Dr.  Martinique, cardiology.  Supratherapeutic INR: 5.98.  Likely related to poor oral intake in the context of GI symptoms.  Hold Coumadin and managed per pharmacy.  Avoid reversing with vitamin K in the absence of bleeding given history of mechanical valves.  IV heparin discussion as above.  Status post mechanical MVR & AVR: Management as above.  Chronic diastolic CHF: TTE 35/46/56 shows LVEF 60-65%.  Patient reports chronic lower extremity edema.  Diuretics including Lasix, metolazone, HCTZ, spironolactone temporarily held due to GI losses, acute kidney injury and SIRS nature.  Apart from leg edema, stable without features of pulmonary edema.  Monitor closely and consider starting diuretic as soon as possible.  Acute on stage III chronic kidney disease: Baseline creatinine 1-1 0.3.  Patient presented with creatinine of 1.88.  Likely prerenal due to dehydration from GI losses and diuretics.  Temporarily hold  diuretics.  Brief IV fluids.  Follow BMP in a.m.  Intake and output charting.  Dehydration with hyponatremia: Secondary to GI losses and diuretics.  Management as above.  Sodium is improved from 128-131.  TSH normal.  Follow daily BMP.  Liver cirrhosis: Continue rifaximin.  Ammonia level normal.  Microcytic anemia: Unclear etiology.  Stable without bleeding.  Follow CBC in a.m.  Leukocytosis:?  Stress response?  Rule out C. difficile.  No overt source of sepsis noted.  Judgment is about.  Follow CBCs daily.  Blood cultures.  Weight loss: Reports 16 pound weight loss since November 2018.  May be multifactorial related to aggressive diuresis for leg edema/CHF, poor oral intake related to intermittent nausea and vomiting from potassium supplements.  Unable to remember if she had colonoscopy but has been advised that she is not a good candidate by her PCP/GI (Dr. Paulita Fujita).  Outpatient follow-up.   DVT prophylaxis: Supratherapeutic INR on Coumadin. Code Status: Full. Family Communication: Discussed with caregiver at bedside. Disposition: DC home pending clinical improvement.   Consultants:  General surgery  Procedures:  None  Antimicrobials:  IV Zosyn   Subjective: Patient interviewed and examined this morning in the ED.  Caregiver at bedside.  Discussed with RN.  Reports feeling better with improvement in abdominal pain but still has mostly lower abdominal pain with associated mild distention.  No further nausea, vomiting or BM since admission.  Denies sick contacts with similar symptoms, eating out or anything unusual.  Reports dry cough.  Reports 16 pound weight loss since November.  ROS: As above  Objective:  Vitals:   01/20/18 0800 01/20/18 0905 01/20/18 1103 01/20/18 1104  BP: (!) 117/54 (!) 119/56 115/72   Pulse: (!) 110 (!) 105 (!) 104 (!) 101  Resp: 16 17 (!) 22   Temp:      TempSrc:      SpO2: 96% 99% 100%   Weight:      Height:        Examination:  General exam:  Pleasant elderly female, moderately built and nourished, lying comfortably supine in bed.  Oral mucosa slightly dry. Respiratory system: Clear to auscultation. Respiratory effort normal. Cardiovascular system: S1 & S2 heard, RRR. No JVD, murmurs, rubs, gallops or clicks.  2+ pitting bilateral leg edema. Gastrointestinal system: Abdomen is mildly distended especially lower abdomen, mild diffuse abdominal tenderness without rigidity, guarding or rebound. No organomegaly or masses felt. Normal bowel sounds heard. Central nervous system: Alert and oriented. No focal neurological deficits. Extremities: Symmetric 5 x 5 power. Skin: No rashes, lesions or ulcers Psychiatry: Judgement and insight appear normal. Mood & affect appropriate.     Data Reviewed: I have personally reviewed following labs and imaging studies  CBC: Recent Labs  Lab 01/18/2018 1544 01/20/18 0500  WBC 26.5* 26.0*  HGB 10.0* 9.2*  HCT 29.4* 27.3*  MCV 103.2* 105.0*  PLT 194 062   Basic Metabolic Panel: Recent Labs  Lab 01/25/2018 1544 01/20/18 0500  NA 128* 131*  K 3.6 3.6  CL 92* 97*  CO2 24 25  GLUCOSE 141* 109*  BUN 27* 32*  CREATININE 1.88* 1.75*  CALCIUM 8.2* 7.8*   Liver Function  Tests: Recent Labs  Lab 01/27/2018 1544  AST 40  ALT 17  ALKPHOS 69  BILITOT 1.5*  PROT 6.7  ALBUMIN 2.5*   Coagulation Profile: Recent Labs  Lab 01/17/2018 2240  INR 5.98*    CBG: Recent Labs  Lab 01/20/18 0822  GLUCAP 91    No results found for this or any previous visit (from the past 240 hour(s)).       Radiology Studies: Ct Abdomen Pelvis Wo Contrast  Result Date: 01/25/2018 CLINICAL DATA:  Abdominal distention with nausea vomiting diarrhea. Weight loss EXAM: CT ABDOMEN AND PELVIS WITHOUT CONTRAST TECHNIQUE: Multidetector CT imaging of the abdomen and pelvis was performed following the standard protocol without IV contrast. COMPARISON:  CT abdomen pelvis 07/20/2017 FINDINGS: Lower chest: Small to  moderate right effusion with progression. Right lower lobe atelectasis with progression. No significant left effusion. Cardiac enlargement Hepatobiliary: Enlargement left lobe liver with irregular contour liver suggesting cirrhosis. Multiple gallstones without gallbladder wall thickening or biliary dilatation Pancreas: Negative Spleen: Negative Adrenals/Urinary Tract: Negative for renal stone or obstruction. Normal urinary bladder. No renal mass on unenhanced images. Stomach/Bowel: Small bowel is mildly dilated with air-fluid levels. Colon decompressed. Dilated varices in the left upper quadrant unchanged from the prior study likely due to portal hypertension. No bowel edema Vascular/Lymphatic: Atherosclerotic disease without aneurysm of the aorta Reproductive: Hysterectomy.  No pelvic mass Other: Negative for ascites. Musculoskeletal: No acute skeletal abnormality. IMPRESSION: 1. Findings consistent with cirrhosis and portal hypertension with varices. No ascites. 2. Cholelithiasis without cholecystitis 3. Mild small bowel dilatation with air-fluid levels which may be due to partial small bowel obstruction. Colon decompressed. Electronically Signed   By: Franchot Gallo M.D.   On: 01/22/2018 20:14   Dg Abd Portable 1v  Result Date: 01/20/2018 CLINICAL DATA:  Abdominal pain since 01/22/2018. EXAM: PORTABLE ABDOMEN - 1 VIEW COMPARISON:  CT abdomen and pelvis today. FINDINGS: Mild dilatation of small bowel seen on CT today is difficult to appreciate on this examination. Gas is seen throughout almost the entire colon. No unexpected abdominal calcification or focal bony abnormality. IMPRESSION: Mild small bowel dilatation seen on CT scan scanned today is difficult to appreciate on plain film. The bowel gas pattern is benign. Electronically Signed   By: Inge Rise M.D.   On: 01/20/2018 11:21        Scheduled Meds: . digoxin  0.125 mg Oral Daily  . ferrous sulfate  325 mg Oral Q breakfast  . metoprolol  tartrate  2.5 mg Intravenous Q8H  . rifaximin  550 mg Oral BID  . traZODone  100 mg Oral QHS   Continuous Infusions: . piperacillin-tazobactam (ZOSYN)  IV Stopped (01/20/18 1102)     LOS: 1 day     Vernell Leep, MD, FACP, [facility]. [facility] Pager [phone]  If 7PM-7AM, please contact night-coverage [email or web address] Password [facility] 01/20/2018, 11:54 AM

## 2018-01-20 NOTE — ED Notes (Signed)
Zosyn administered prior to second set of blood cultures being drawn due to vascular access difficulties.

## 2018-01-20 NOTE — ED Notes (Signed)
Patient transported to X-ray 

## 2018-01-20 NOTE — ED Notes (Signed)
Attempted report 

## 2018-01-21 ENCOUNTER — Inpatient Hospital Stay (HOSPITAL_COMMUNITY): Payer: Medicare Other

## 2018-01-21 DIAGNOSIS — I5033 Acute on chronic diastolic (congestive) heart failure: Secondary | ICD-10-CM

## 2018-01-21 DIAGNOSIS — K56600 Partial intestinal obstruction, unspecified as to cause: Secondary | ICD-10-CM

## 2018-01-21 DIAGNOSIS — E876 Hypokalemia: Secondary | ICD-10-CM

## 2018-01-21 DIAGNOSIS — R791 Abnormal coagulation profile: Secondary | ICD-10-CM

## 2018-01-21 DIAGNOSIS — N179 Acute kidney failure, unspecified: Secondary | ICD-10-CM

## 2018-01-21 DIAGNOSIS — R338 Other retention of urine: Secondary | ICD-10-CM

## 2018-01-21 DIAGNOSIS — I4891 Unspecified atrial fibrillation: Secondary | ICD-10-CM

## 2018-01-21 DIAGNOSIS — N183 Chronic kidney disease, stage 3 (moderate): Secondary | ICD-10-CM

## 2018-01-21 DIAGNOSIS — N189 Chronic kidney disease, unspecified: Secondary | ICD-10-CM

## 2018-01-21 DIAGNOSIS — K529 Noninfective gastroenteritis and colitis, unspecified: Secondary | ICD-10-CM

## 2018-01-21 DIAGNOSIS — Z952 Presence of prosthetic heart valve: Secondary | ICD-10-CM

## 2018-01-21 LAB — GLUCOSE, CAPILLARY: Glucose-Capillary: 92 mg/dL (ref 65–99)

## 2018-01-21 LAB — CBC
HEMATOCRIT: 28.3 % — AB (ref 36.0–46.0)
Hemoglobin: 9.5 g/dL — ABNORMAL LOW (ref 12.0–15.0)
MCH: 34.9 pg — AB (ref 26.0–34.0)
MCHC: 33.6 g/dL (ref 30.0–36.0)
MCV: 104 fL — AB (ref 78.0–100.0)
PLATELETS: 191 10*3/uL (ref 150–400)
RBC: 2.72 MIL/uL — AB (ref 3.87–5.11)
RDW: 13.8 % (ref 11.5–15.5)
WBC: 24.3 10*3/uL — ABNORMAL HIGH (ref 4.0–10.5)

## 2018-01-21 LAB — COMPREHENSIVE METABOLIC PANEL
ALT: 16 U/L (ref 14–54)
AST: 37 U/L (ref 15–41)
Albumin: 2.2 g/dL — ABNORMAL LOW (ref 3.5–5.0)
Alkaline Phosphatase: 68 U/L (ref 38–126)
Anion gap: 10 (ref 5–15)
BILIRUBIN TOTAL: 1.8 mg/dL — AB (ref 0.3–1.2)
BUN: 30 mg/dL — AB (ref 6–20)
CALCIUM: 8.2 mg/dL — AB (ref 8.9–10.3)
CHLORIDE: 98 mmol/L — AB (ref 101–111)
CO2: 26 mmol/L (ref 22–32)
CREATININE: 1.35 mg/dL — AB (ref 0.44–1.00)
GFR, EST AFRICAN AMERICAN: 41 mL/min — AB (ref >60)
GFR, EST NON AFRICAN AMERICAN: 35 mL/min — AB (ref >60)
Glucose, Bld: 97 mg/dL (ref 65–99)
Potassium: 3.3 mmol/L — ABNORMAL LOW (ref 3.5–5.1)
Sodium: 134 mmol/L — ABNORMAL LOW (ref 135–145)
TOTAL PROTEIN: 6.4 g/dL — AB (ref 6.5–8.1)

## 2018-01-21 LAB — URINE CULTURE: Culture: <10000 — AB

## 2018-01-21 LAB — PROTIME-INR
INR: 6.92
Prothrombin Time: 59.4 seconds — ABNORMAL HIGH (ref 11.4–15.2)

## 2018-01-21 MED ORDER — POTASSIUM CHLORIDE CRYS ER 20 MEQ PO TBCR
40.0000 meq | EXTENDED_RELEASE_TABLET | Freq: Once | ORAL | Status: AC
  Administered 2018-01-21: 40 meq via ORAL
  Filled 2018-01-21: qty 2

## 2018-01-21 MED ORDER — ORAL CARE MOUTH RINSE
15.0000 mL | Freq: Two times a day (BID) | OROMUCOSAL | Status: DC
  Administered 2018-01-21 – 2018-02-01 (×23): 15 mL via OROMUCOSAL

## 2018-01-21 MED ORDER — POTASSIUM CHLORIDE CRYS ER 10 MEQ PO TBCR
40.0000 meq | EXTENDED_RELEASE_TABLET | Freq: Once | ORAL | Status: AC
  Administered 2018-01-21: 40 meq via ORAL
  Filled 2018-01-21: qty 4

## 2018-01-21 MED ORDER — SODIUM CHLORIDE 0.9 % IV SOLN
INTRAVENOUS | Status: DC
  Administered 2018-01-21: 11:00:00 via INTRAVENOUS

## 2018-01-21 MED ORDER — MORPHINE SULFATE (PF) 2 MG/ML IV SOLN
1.0000 mg | INTRAVENOUS | Status: DC | PRN
  Administered 2018-01-21 – 2018-02-01 (×45): 1 mg via INTRAVENOUS
  Filled 2018-01-21 (×45): qty 1

## 2018-01-21 MED ORDER — FUROSEMIDE 10 MG/ML IJ SOLN
20.0000 mg | Freq: Two times a day (BID) | INTRAMUSCULAR | Status: DC
  Administered 2018-01-21 – 2018-01-31 (×20): 20 mg via INTRAVENOUS
  Filled 2018-01-21 (×20): qty 2

## 2018-01-21 MED ORDER — DEXTROSE 5 % IV SOLN: 40.0000 meq | Freq: Once | INTRAVENOUS | Status: DC

## 2018-01-21 NOTE — Progress Notes (Addendum)
[place] for heparin Indication: atrial fibrillation and mechanical valve  Heparin Dosing Weight: 51.7 kg  Labs: Recent Labs    01/31/2018 1544 01/16/2018 2240 01/20/18 0500 01/20/18 1233 01/20/18 2024  HGB 10.0*  --  9.2*  --   --   HCT 29.4*  --  27.3*  --   --   PLT 194  --  168  --   --   APTT  --  98*  --   --   --   LABPROT  --  53.0*  --  54.8*  --   INR  --  5.98*  --  6.24*  --   HEPARINUNFRC  --   --   --   --  <0.10*  CREATININE 1.88*  --  1.75*  --   --     Assessment: 3 yof on warfarin PTA for afib and mechanical valve. Pharmacy consulted to dose heparin. INR on admit 5.98, Hg 10, plt wnl. No bleed documented. Last dose of warfarin 4/17 per med rec. INR 4/18 was 6.24. Today 6.92. No bleeding noted, Hgb stable  Goal of Therapy:  INR 2.5-3.5 Monitor platelets by anticoagulation protocol: Yes   Plan:  Heparin when INR<2.5 Monitor CBC, s/sx bleeding  Jeraldine Primeau A. Levada Dy, PharmD, [place] Pager: [phone]  01/21/2018 7:34 AM

## 2018-01-21 NOTE — Progress Notes (Signed)
Lab called with a critical lab value on her INR of 6.92. I have texted paged Dr. Algis Liming to inform him of this lab value.

## 2018-01-21 NOTE — Progress Notes (Signed)
PROGRESS NOTE   [name] Chung  EHU:[id number]    DOB: 08-15-1933    DOA: 01/25/2018  PCP: Lajean Manes, MD   I have briefly reviewed patients previous medical records in [facility]  Brief Narrative:  84 year old married female, lives at [employer] in an apartment with her spouse who has dementia, independent, PMH of chronic A. fib, mechanical AVR & MVR on Coumadin, chronic diastolic CHF (Dr. Martinique, Cardiology), stage III chronic kidney disease, chronic lower extremity edema, hysterectomy presented to ED 01/21/2018 with 3 days history of nausea, nonbloody emesis, abdominal distention and pain for which she was seen by PCP 2 days PTA and started on Cipro for presumed UTI but UA returned negative so Cipro was stopped and also reported diarrhea.  PCP started Imodium with improvement in diarrhea.  In ED, CT abdomen and pelvis suggestive of PSBO.  General surgery was consulted and patient admitted for further evaluation and management.   Assessment & Plan:   Principal Problem:   Partial small bowel obstruction (HCC) Active Problems:   Chronic atrial fibrillation (HCC)   Long term current use of anticoagulant therapy   S/P MVR (mitral valve replacement)   S/P AVR (aortic valve replacement)   Acute renal failure superimposed on stage 3 chronic kidney disease (HCC)   Atrial fibrillation with RVR (HCC)   Chronic systolic (congestive) heart failure (HCC)   Hyponatremia   Sepsis (HCC)   Liver cirrhosis (HCC)   Partial small bowel obstruction versus gastroenteritis: Unclear etiology.  History of hysterectomy.  General surgery was consulted and recommend conservative management with NPO septic meds with sips and ice chips, mobilize, monitor closely. No acute surgical needs seen and patient will be a high risk if surgery needed.  Follow-up abdominal x-ray 4/18 and 4/19 without obstruction.  Ongoing lower abdominal distention and pain without BM or nausea and vomiting.  Surgery input appreciated,  have initiated clear liquids.  Acute urinary retention: Despite some voiding, concern regarding urinary retention.  Bladder scan showed 885 mL.  In and out catheterization as needed.  Mobilize.  Her lower abdominal distention and pain may be all related to acute urinary retention rather than GI issues.  Monitor closely.  Nausea, vomiting and diarrhea: Possibly related to PSBO versus acute GE.  No BM for 48 hours now.  Mild intermittent nausea but no vomiting.  Low index of suspicion for C. difficile, order canceled.  SIRS: Although patient had tachycardia, tachypnea and leukocytosis, no clear focus of infection.  Do not think that she had sepsis on admission.  Lactate normal.  Remained hemodynamically stable.  Denies urinary symptoms and UA not consistent with UTI.  PCT 24.83.  Chest x-ray without pneumonia.  Was empirically started on IV Zosyn on admission, continue given significant leukocytosis and concern for enteritis.   Chronic A. fib with RVR: CHA2DS2-VASc Score is 4.  Presented with heart rate in the 110s-130s, likely related to PSBO associated symptoms.  Continue digoxin.  Digoxin level low.  IV metoprolol 2.5 mg every 8 hours started.  Patient sees Dr.  Martinique, cardiology.  Ventricular rates better but still with mild RVR in the 100s-110s.  Coagulopathic with INR 6.92.  Consulted cardiology to assist with rate control?  IV Cardizem drip, congestive heart failure management (not on diuretics due to n.p.o., acute kidney injury) and coagulopathy.  Supratherapeutic INR: Progressively increased to 6.92 on 4/19.  Likely related to poor oral intake in the context of GI symptoms.  Hold Coumadin and managed per  pharmacy.  Avoid reversing with vitamin K in the absence of bleeding given history of mechanical valves.  IV heparin when INR <2.5.  Await cardiology input re reversing INR.  Status post mechanical MVR & AVR: Management as above.  Chronic diastolic CHF: TTE 25/85/27 shows LVEF 60-65%.  Patient  reports chronic lower extremity edema.  Diuretics including Lasix, metolazone, HCTZ, spironolactone temporarily held due to GI losses, acute kidney injury and SIRS nature.  Apart from trace leg edema, stable without clinical pulmonary edema although chest x-ray suggests some pulmonary edema.  Monitor closely and consider starting diuretic as soon as possible, possibly 4/20.  Acute on stage III chronic kidney disease: Baseline creatinine 1-1.3.  Patient presented with creatinine of 1.88.  Likely prerenal due to dehydration from GI losses and diuretics.  Temporarily hold diuretics.  Improving.  Creatinine down to 1.3.  Acute urinary retention may be contributing.  IV fluids reduced to 20 mL/h due to concern about CHF.  Follow BMP in a.m.  Dehydration with hyponatremia: Secondary to GI losses and diuretics.  Management as above.  Sodium is improved from 128-131.  TSH normal.  Sodium is improved to 134.  Liver cirrhosis: Continue rifaximin.  Ammonia level normal.  Microcytic anemia: Unclear etiology.  Hemoglobin stable.  Leukocytosis:?  Stress response. No overt source of sepsis noted.  Slightly better from 26-24.  Urine culture with insignificant growth.  Blood culture pending.  Continue empiric Zosyn for another day and reassess.  Weight loss: Reports 16 pound weight loss since November 2018.  May be multifactorial related to aggressive diuresis for leg edema/CHF, poor oral intake related to intermittent nausea and vomiting from potassium supplements.  Unable to remember if she had colonoscopy but has been advised that she is not a good candidate by her PCP/GI (Dr. Paulita Fujita).  Outpatient follow-up.  Hypokalemia: Replace and follow.   DVT prophylaxis: Supratherapeutic INR on Coumadin. Code Status: Full. Family Communication: Discussed in detail with patient's step daughter and caregiver at bedside.  Updated all care and answered questions. Disposition: DC home pending clinical  improvement.   Consultants:  General surgery Cardiology  Procedures:  In and out catheterization as needed for urinary retention. TED hose.  Antimicrobials:  IV Zosyn   Subjective: Patient interviewed and examined along with stepdaughter and caregiver at bedside.  No BM for 48 hours.  Ongoing severe lower abdominal pain and distention.  Intermittent nausea without vomiting.  No dyspnea or chest pain.  Occasional minimal dry cough.  No dysuria or urinary frequency.  ROS: As above  Objective:  Vitals:   01/21/18 0500 01/21/18 0629 01/21/18 0631 01/21/18 0837  BP:   (!) 117/55 (!) 131/58  Pulse:   (!) 110 95  Resp:   13 19  Temp:    98.2 F (36.8 C)  TempSrc:    Oral  SpO2:  90%  98%  Weight: 63.9 kg (140 lb 14 oz)     Height:        Examination:  General exam: Pleasant elderly female, moderately built and nourished, lying comfortably supine in bed.  Oral mucosa moist. Respiratory system: Occasional basal crackles but otherwise clear to auscultation.  No increased work of breathing. Cardiovascular system: S1 and S2 heard, irregularly irregular.  No JVD or murmurs.  Trace bilateral ankle edema.  Telemetry personally reviewed and shows A. fib with RVR in the 100s-110s. Gastrointestinal system: Lower abdominal/suprapubic distention with tenderness but no guarding, rigidity or rebound.  Diminished bowel sounds heard.  No organomegaly  or masses appreciated. Central nervous system: Alert and oriented. No focal neurological deficits. Extremities: Symmetric 5 x 5 power. Skin: No rashes, lesions or ulcers Psychiatry: Judgement and insight appear normal. Mood & affect appropriate.     Data Reviewed: I have personally reviewed following labs and imaging studies  CBC: Recent Labs  Lab 01/18/2018 1544 01/20/18 0500 01/21/18 0831  WBC 26.5* 26.0* 24.3*  HGB 10.0* 9.2* 9.5*  HCT 29.4* 27.3* 28.3*  MCV 103.2* 105.0* 104.0*  PLT 194 168 127   Basic Metabolic Panel: Recent  Labs  Lab 01/11/2018 1544 01/20/18 0500 01/21/18 0831  NA 128* 131* 134*  K 3.6 3.6 3.3*  CL 92* 97* 98*  CO2 24 25 26   GLUCOSE 141* 109* 97  BUN 27* 32* 30*  CREATININE 1.88* 1.75* 1.35*  CALCIUM 8.2* 7.8* 8.2*   Liver Function Tests: Recent Labs  Lab 01/06/2018 1544 01/21/18 0831  AST 40 37  ALT 17 16  ALKPHOS 69 68  BILITOT 1.5* 1.8*  PROT 6.7 6.4*  ALBUMIN 2.5* 2.2*   Coagulation Profile: Recent Labs  Lab 01/15/2018 2240 01/20/18 1233 01/21/18 0831  INR 5.98* 6.24* 6.92*    CBG: Recent Labs  Lab 01/20/18 0822 01/21/18 0745  GLUCAP 91 92    Recent Results (from the past 240 hour(s))  Urine Culture     Status: Abnormal   Collection Time: 01/30/2018 10:19 PM  Result Value Ref Range Status   Specimen Description URINE, RANDOM  Final   Special Requests NONE  Final   Culture (A)  Final    <10,000 COLONIES/mL INSIGNIFICANT GROWTH Performed at [facility], [place] [address], [place], [place] [address]    Report Status 01/21/2018 FINAL  Final  Culture, blood (Routine X 2) w Reflex to ID Panel     Status: None (Preliminary result)   Collection Time: 01/20/18 12:24 PM  Result Value Ref Range Status   Specimen Description BLOOD RIGHT HAND  Final   Special Requests   Final    BOTTLES DRAWN AEROBIC AND ANAEROBIC Blood Culture adequate volume   Culture PENDING  Incomplete   Report Status PENDING  Incomplete         Radiology Studies: Ct Abdomen Pelvis Wo Contrast  Result Date: 01/10/2018 CLINICAL DATA:  Abdominal distention with nausea vomiting diarrhea. Weight loss EXAM: CT ABDOMEN AND PELVIS WITHOUT CONTRAST TECHNIQUE: Multidetector CT imaging of the abdomen and pelvis was performed following the standard protocol without IV contrast. COMPARISON:  CT abdomen pelvis 07/20/2017 FINDINGS: Lower chest: Small to moderate right effusion with progression. Right lower lobe atelectasis with progression. No significant left effusion. Cardiac enlargement Hepatobiliary:  Enlargement left lobe liver with irregular contour liver suggesting cirrhosis. Multiple gallstones without gallbladder wall thickening or biliary dilatation Pancreas: Negative Spleen: Negative Adrenals/Urinary Tract: Negative for renal stone or obstruction. Normal urinary bladder. No renal mass on unenhanced images. Stomach/Bowel: Small bowel is mildly dilated with air-fluid levels. Colon decompressed. Dilated varices in the left upper quadrant unchanged from the prior study likely due to portal hypertension. No bowel edema Vascular/Lymphatic: Atherosclerotic disease without aneurysm of the aorta Reproductive: Hysterectomy.  No pelvic mass Other: Negative for ascites. Musculoskeletal: No acute skeletal abnormality. IMPRESSION: 1. Findings consistent with cirrhosis and portal hypertension with varices. No ascites. 2. Cholelithiasis without cholecystitis 3. Mild small bowel dilatation with air-fluid levels which may be due to partial small bowel obstruction. Colon decompressed. Electronically Signed   By: Franchot Gallo M.D.   On: 01/31/2018 20:14  Dg Chest 2 View  Result Date: 01/20/2018 CLINICAL DATA:  Systemic inflammatory response syndrome, shortness of breath since having valve replacement surgery EXAM: CHEST - 2 VIEW COMPARISON:  01/17/2015 FINDINGS: Enlargement of cardiac silhouette with pulmonary vascular congestion post median sternotomy, AVR and MVR. Atherosclerotic calcification aorta. Prominent central pulmonary arteries. Mild interstitial edema with RIGHT pleural effusion and basilar atelectasis. Underlying emphysematous changes. No pneumothorax. Bones demineralized. IMPRESSION: Enlargement of cardiac silhouette with pulmonary vascular congestion post AVR and MVR. COPD changes with superimposed mild pulmonary edema, RIGHT pleural effusion and RIGHT basilar atelectasis. Electronically Signed   By: Lavonia Dana M.D.   On: 01/20/2018 14:19   Dg Abd Portable 1v  Result Date: 01/21/2018 CLINICAL DATA:   Small-bowel obstruction EXAM: PORTABLE ABDOMEN - 1 VIEW COMPARISON:  01/20/2018 FINDINGS: Normal bowel gas pattern.  No dilated bowel loops. Right pleural effusion and right lower lobe airspace disease IMPRESSION: Normal bowel gas pattern. Electronically Signed   By: Franchot Gallo M.D.   On: 01/21/2018 10:45   Dg Abd Portable 1v  Result Date: 01/20/2018 CLINICAL DATA:  Abdominal pain since 01/22/2018. EXAM: PORTABLE ABDOMEN - 1 VIEW COMPARISON:  CT abdomen and pelvis today. FINDINGS: Mild dilatation of small bowel seen on CT today is difficult to appreciate on this examination. Gas is seen throughout almost the entire colon. No unexpected abdominal calcification or focal bony abnormality. IMPRESSION: Mild small bowel dilatation seen on CT scan scanned today is difficult to appreciate on plain film. The bowel gas pattern is benign. Electronically Signed   By: Inge Rise M.D.   On: 01/20/2018 11:21        Scheduled Meds: . digoxin  0.125 mg Oral Daily  . mouth rinse  15 mL Mouth Rinse BID  . metoprolol tartrate  2.5 mg Intravenous Q8H  . rifaximin  550 mg Oral BID  . traZODone  100 mg Oral QHS   Continuous Infusions: . sodium chloride 20 mL/hr at 01/21/18 1044  . piperacillin-tazobactam (ZOSYN)  IV Stopped (01/21/18 0906)     LOS: 2 days     Vernell Leep, MD, FACP, [facility] Pager [phone]  If 7PM-7AM, please contact night-coverage [email or web address] Password [facility] 01/21/2018, 12:04 PM

## 2018-01-21 NOTE — Progress Notes (Signed)
Progress Note: General Surgery Service   Assessment/Plan: Patient Active Problem List   Diagnosis Date Noted  . Partial small bowel obstruction ([place]) 01/03/2018  . Acute renal failure superimposed on stage 3 chronic kidney disease ([place]) 01/28/2018  . Atrial fibrillation with RVR ([place]) 01/21/2018  . Chronic systolic (congestive) heart failure ([place]) 01/06/2018  . Hyponatremia 01/12/2018  . Sepsis ([place]) 01/30/2018  . Liver cirrhosis ([place]) 01/13/2018  . Edema 03/06/2014  . Encounter for therapeutic drug monitoring 10/31/2013  . S/P MVR (mitral valve replacement) 09/14/2013  . S/P AVR (aortic valve replacement) 09/14/2013  . Heart valve replaced by other means 02/03/2011  . Long term current use of anticoagulant therapy 02/03/2011  . Aortic valve disorders 01/06/2011  . Elevated BP 01/06/2011  . Valvular heart disease   . Chronic atrial fibrillation ([place])   . Chronic anticoagulation   . Rheumatic heart disease   . Aortic insufficiency   . Arthritis    pSBO vs enteritis, XR yesterday shows air in colon and no small bowel distension or air fluid levels  -no vomiting overnight, no diarrhea, continued lower abdominal tenderness and distension -trial of clear liquids today -continue abx    LOS: 2 days  Chief Complaint/Subjective: Thirsty, no diarrhea yesterday, no nausea or vomiting, still having intense abdominal pain  Objective: Vital signs in last 24 hours: Temp:  [98.2 F (36.8 C)-98.7 F (37.1 C)] 98.2 F (36.8 C) (04/19 0837) Pulse Rate:  [95-128] 95 (04/19 0837) Resp:  [13-22] 19 (04/19 0837) BP: (115-147)/(36-72) 131/58 (04/19 0837) SpO2:  [90 %-100 %] 98 % (04/19 0837) Weight:  [63.9 kg (140 lb 14 oz)-64 kg (141 lb)] 63.9 kg (140 lb 14 oz) (04/19 0500) Last BM Date: 01/06/2018  Intake/Output from previous day: 04/18 0701 - 04/19 0700 In: 260 [I.V.:10; IV Piggyback:250] Out: 300 [Urine:300] Intake/Output this shift: No intake/output data recorded.  Abd: soft,  TTP throughout, distended  Extremities: no edema  Neuro: AOx4  Lab Results: CBC  Recent Labs    01/20/18 0500 01/21/18 0831  WBC 26.0* 24.3*  HGB 9.2* 9.5*  HCT 27.3* 28.3*  PLT 168 191   BMET Recent Labs    01/20/18 0500 01/21/18 0831  NA 131* 134*  K 3.6 3.3*  CL 97* 98*  CO2 25 26  GLUCOSE 109* 97  BUN 32* 30*  CREATININE 1.75* 1.35*  CALCIUM 7.8* 8.2*   PT/INR Recent Labs    01/20/18 1233 01/21/18 0831  LABPROT 54.8* 59.4*  INR 6.24* 6.92*   ABG No results for input(s): PHART, HCO3 in the last 72 hours.  Invalid input(s): PCO2, PO2  Studies/Results:  Anti-infectives: Anti-infectives (From admission, onward)   Start     Dose/Rate Route Frequency Ordered Stop   01/20/18 1500  piperacillin-tazobactam (ZOSYN) IVPB 2.25 g     2.25 g 100 mL/hr over 30 Minutes Intravenous Every 6 hours 01/20/18 1211     01/20/18 0500  piperacillin-tazobactam (ZOSYN) IVPB 2.25 g  Status:  Discontinued     2.25 g 100 mL/hr over 30 Minutes Intravenous Every 6 hours 01/12/2018 2220 01/20/18 1211   01/06/2018 2330  rifaximin (XIFAXAN) tablet 550 mg     550 mg Oral 2 times daily 01/14/2018 2224     01/17/2018 2230  piperacillin-tazobactam (ZOSYN) IVPB 3.375 g     3.375 g 100 mL/hr over 30 Minutes Intravenous  Once 01/11/2018 2220 01/21/2018 2300      Medications: Scheduled Meds: . digoxin  0.125 mg Oral Daily  . mouth  rinse  15 mL Mouth Rinse BID  . metoprolol tartrate  2.5 mg Intravenous Q8H  . rifaximin  550 mg Oral BID  . traZODone  100 mg Oral QHS   Continuous Infusions: . sodium chloride    . piperacillin-tazobactam (ZOSYN)  IV 2.25 g (01/21/18 0836)   PRN Meds:.acetaminophen **OR** acetaminophen, hydrALAZINE, morphine injection, ondansetron (ZOFRAN) IV, zolpidem  Mickeal Skinner, MD Pg# [phone] [facility]

## 2018-01-21 NOTE — Consult Note (Addendum)
Cardiology Consultation:   Patient ID: [name] Chung; [id number]; 1933-04-05   Admit date: 01/18/2018 Date of Consult: 01/21/2018  Primary Care Provider: Lajean Manes, MD Primary Cardiologist: [name] Martinique, MD  Patient Profile:   [name] Chung is a 84 y.o. female with a hx of rheumatic valvular heart disease status post redo aortic valve replacement x3 and redo mitral valve surgery x2, persistent atrial fibrillation, coumadin for anticoagulation, chronic LE edema (multifactorial with diastolic CHF, venous insufficiency, cirrhosis)  to who is being seen today for the evaluation of CHF, afib and anticoagualtion at the request of [name] Chung.   History of Rheumatic heart disease.  She underwent redo open heart surgery in December 2012 at [facility]. This included redo aortic valve replacement with a Medtronic freestyle aortic root conduit. She also underwent tricuspid valve annuloplasty and repair. The mitral valve replacement was unchanged. She had initial surgery for rheumatic heart disease in 1982 with both aortic and mitral valve replacements with Bjork Shiley valves. She had repeat surgery in 1995 with a homograft aortic valve and a St. Jude mechanical mitral valve replacement. She is on chronic Coumadin.  She was doing well on cardiac stand point when last seen by Dr. Martinique 12/17/17.   History of Present Illness:   [name] Chung admitted 2 days ago for nausea, vomiting, abdominal pain and diarrhea who found to have SBO. Prior to presentation she was treated with cipro by PCP for possible UTI>> developed diarrhea>> started on imodium with improvement however she had severe lower abdominal pain. CT of abdomen/pelvis showed partial small bowel obstruction. Patient is admitted to stepdown as inpatient. General surgery recommended conservative management.  Follow-up abdominal x-ray 4/18 and 4/19 without obstruction.  Started clear liquid this afternoon. Not passing gas.   Her INR is  treading up since admit 5.98>>6.24>>6.92. Coumadin on hold. Denies any blood in stool prior to presentation. No BM here. Her diuretics on hold. HR in 90-120s on IV lopressor 2.5mg  q 8 hours. NP stable. No chest pain, SOB, orthopnea, PND or syncope. She has lost her weight.   Past Medical History:  Diagnosis Date  . Aortic insufficiency   . Arthritis   . Chronic anticoagulation   . Chronic atrial fibrillation ([place])   . Edema   . Rheumatic heart disease   . Valvular heart disease     Past Surgical History:  Procedure Laterality Date  . ABDOMINAL HYSTERECTOMY    . AORTIC VALVE REPLACEMENT     1982, redo 1995 homograft, redo 2012 Medronic valve conduit-freestyle  . BACK SURGERY    . CATARACT EXTRACTION W/ INTRAOCULAR LENS  IMPLANT, BILATERAL    . MITRAL VALVE REPLACEMENT     1982, redo 1995 St Jude  valve     Inpatient Medications: Scheduled Meds: . digoxin  0.125 mg Oral Daily  . mouth rinse  15 mL Mouth Rinse BID  . metoprolol tartrate  2.5 mg Intravenous Q8H  . potassium chloride  40 mEq Oral Once  . rifaximin  550 mg Oral BID  . traZODone  100 mg Oral QHS   Continuous Infusions: . sodium chloride 20 mL/hr at 01/21/18 1044  . piperacillin-tazobactam (ZOSYN)  IV Stopped (01/21/18 0906)   PRN Meds: acetaminophen **OR** acetaminophen, hydrALAZINE, morphine injection, ondansetron (ZOFRAN) IV, zolpidem  Allergies:    Allergies  Allergen Reactions  . Asa Buff (Mag [Buffered Aspirin] Other (See Comments)    On coumadin  . Codeine   . Mirtazapine Nausea Only  . Ultram [  Tramadol Hcl]     Social History:   Social History   Socioeconomic History  . Marital status: Married    Spouse name: Not on file  . Number of children: 0  . Years of education: Not on file  . Highest education level: Not on file  Occupational History  . Occupation: Best boy: [employer]    Comment: retired  Scientific laboratory technician  . Financial resource strain: Not on file  . Food insecurity:     Worry: Not on file    Inability: Not on file  . Transportation needs:    Medical: Not on file    Non-medical: Not on file  Tobacco Use  . Smoking status: Never Smoker  . Smokeless tobacco: Never Used  Substance and Sexual Activity  . Alcohol use: No  . Drug use: No  . Sexual activity: Never  Lifestyle  . Physical activity:    Days per week: Not on file    Minutes per session: Not on file  . Stress: Not on file  Relationships  . Social connections:    Talks on phone: Not on file    Gets together: Not on file    Attends religious service: Not on file    Active member of club or organization: Not on file    Attends meetings of clubs or organizations: Not on file    Relationship status: Not on file  . Intimate partner violence:    Fear of current or ex partner: Not on file    Emotionally abused: Not on file    Physically abused: Not on file    Forced sexual activity: Not on file  Other Topics Concern  . Not on file  Social History Narrative  . Not on file    Family History:   Family History  Problem Relation Age of Onset  . Heart attack Mother   . Stroke Mother   . Diabetes Mother   . Other Father        CAR ACCIDENT     ROS:  Please see the history of present illness.  All other ROS reviewed and negative.     Physical Exam/Data:   Vitals:   01/21/18 0500 01/21/18 0629 01/21/18 0631 01/21/18 0837  BP:   (!) 117/55 (!) 131/58  Pulse:   (!) 110 95  Resp:   13 19  Temp:    98.2 F (36.8 C)  TempSrc:    Oral  SpO2:  90%  98%  Weight: 140 lb 14 oz (63.9 kg)     Height:        Intake/Output Summary (Last 24 hours) at 01/21/2018 1341 Last data filed at 01/21/2018 1329 Gross per 24 hour  Intake 160 ml  Output 1302 ml  Net -1142 ml   Filed Weights   01/11/2018 2200 01/20/18 1958 01/21/18 0500  Weight: 113 lb 15.7 oz (51.7 kg) 141 lb (64 kg) 140 lb 14 oz (63.9 kg)   Body mass index is 23.09 kg/m.  General:  Thin frail female in no acute distress HEENT:  normal Lymph: no adenopathy Neck: no JVD Endocrine:  No thryomegaly Vascular: No carotid bruits; FA pulses 2+ bilaterally without bruits  Cardiac:  normal S1, S2; IR IR ; systolic murmur Lungs:  clear to auscultation bilaterally, no wheezing, rhonchi or rales  Abd: soft, distended and TTP Ext: no edema Musculoskeletal:  No deformities, BUE and BLE strength normal and equal Skin: warm and dry  Neuro:  CNs 2-12 intact, no focal abnormalities noted Psych:  Normal affect   EKG:  The EKG was personally reviewed and demonstrates:  afib at rate of 132 bpm  Telemetry:  Telemetry was personally reviewed and demonstrates: afib at rate of 90-120s  Relevant CV Studies:  Echo 08/19/17: Study Conclusions - Left ventricle: The cavity size was normal. Wall thickness was normal. Systolic function was normal. The estimated ejection fraction was in the range of 60% to 65%. Wall motion was normal; there were no regional wall motion abnormalities. The study is not technically sufficient to allow evaluation of LV diastolic function. - Ventricular septum: Septal motion showed paradox. - Aortic valve: Valve area (VTI): 0.65 cm^2. Valve area (Vmax): 0.69 cm^2. Valve area (Vmean): 0.66 cm^2. - Mitral valve: A mechanical prosthesis was present. Valve area by continuity equation (using LVOT flow): 0.54 cm^2. - Left atrium: The atrium was mildly dilated. - Right ventricle: Systolic function was mildly reduced. - Right atrium: The atrium was mildly dilated. - Atrial septum: No defect or patent foramen ovale was identified. - Tricuspid valve: Increased transvalvular gradients, suspect previous annuloplasty repair. - Pulmonic valve: Peak gradient (S): 14 mm Hg. - Pulmonary arteries: Systolic pressure was mildly increased. - Pericardium, extracardiac: There was a right pleural effusion.  Impressions:  - High flow velocities are seen across all four cardiac valves, suggestive of  increased cardiac output (e.g. anemia, thyrotoxicosis, infection, etc.). Gradients are measured at a mean heart rate of 88 bpm. Relatively fast heart rate also exaggerates gradients across the mitral and tricuspid valves.. The previous report described moderate to severe and perivalvular aortic insufficiency, but no aortic insufficiency is seen on the current study (suspect interval aortic valve surgery).Marland Kitchen LVOT flow velocities were not accurately measured - this limits the ability to calculate the cardiac output and fully evaluate valve hemodynamics.    Laboratory Data:  Chemistry Recent Labs  Lab 01/31/2018 1544 01/20/18 0500 01/21/18 0831  NA 128* 131* 134*  K 3.6 3.6 3.3*  CL 92* 97* 98*  CO2 24 25 26   GLUCOSE 141* 109* 97  BUN 27* 32* 30*  CREATININE 1.88* 1.75* 1.35*  CALCIUM 8.2* 7.8* 8.2*  GFRNONAA 24* 26* 35*  GFRAA 27* 30* 41*  ANIONGAP 12 9 10     Recent Labs  Lab 01/08/2018 1544 01/21/18 0831  PROT 6.7 6.4*  ALBUMIN 2.5* 2.2*  AST 40 37  ALT 17 16  ALKPHOS 69 68  BILITOT 1.5* 1.8*   Hematology Recent Labs  Lab 01/17/2018 1544 01/20/18 0500 01/21/18 0831  WBC 26.5* 26.0* 24.3*  RBC 2.85* 2.60* 2.72*  HGB 10.0* 9.2* 9.5*  HCT 29.4* 27.3* 28.3*  MCV 103.2* 105.0* 104.0*  MCH 35.1* 35.4* 34.9*  MCHC 34.0 33.7 33.6  RDW 13.3 13.6 13.8  PLT 194 168 191   Cardiac EnzymesNo results for input(s): TROPONINI in the last 168 hours. No results for input(s): TROPIPOC in the last 168 hours.  BNP Recent Labs  Lab 01/14/2018 2240  BNP 159.9*    DDimer No results for input(s): DDIMER in the last 168 hours.  Radiology/Studies:  Ct Abdomen Pelvis Wo Contrast  Result Date: 01/24/2018 CLINICAL DATA:  Abdominal distention with nausea vomiting diarrhea. Weight loss EXAM: CT ABDOMEN AND PELVIS WITHOUT CONTRAST TECHNIQUE: Multidetector CT imaging of the abdomen and pelvis was performed following the standard protocol without IV contrast. COMPARISON:   CT abdomen pelvis 07/20/2017 FINDINGS: Lower chest: Small to moderate right effusion with progression. Right lower lobe atelectasis with progression. No  significant left effusion. Cardiac enlargement Hepatobiliary: Enlargement left lobe liver with irregular contour liver suggesting cirrhosis. Multiple gallstones without gallbladder wall thickening or biliary dilatation Pancreas: Negative Spleen: Negative Adrenals/Urinary Tract: Negative for renal stone or obstruction. Normal urinary bladder. No renal mass on unenhanced images. Stomach/Bowel: Small bowel is mildly dilated with air-fluid levels. Colon decompressed. Dilated varices in the left upper quadrant unchanged from the prior study likely due to portal hypertension. No bowel edema Vascular/Lymphatic: Atherosclerotic disease without aneurysm of the aorta Reproductive: Hysterectomy.  No pelvic mass Other: Negative for ascites. Musculoskeletal: No acute skeletal abnormality. IMPRESSION: 1. Findings consistent with cirrhosis and portal hypertension with varices. No ascites. 2. Cholelithiasis without cholecystitis 3. Mild small bowel dilatation with air-fluid levels which may be due to partial small bowel obstruction. Colon decompressed. Electronically Signed   By: Franchot Gallo M.D.   On: 01/11/2018 20:14   Dg Chest 2 View  Result Date: 01/20/2018 CLINICAL DATA:  Systemic inflammatory response syndrome, shortness of breath since having valve replacement surgery EXAM: CHEST - 2 VIEW COMPARISON:  01/17/2015 FINDINGS: Enlargement of cardiac silhouette with pulmonary vascular congestion post median sternotomy, AVR and MVR. Atherosclerotic calcification aorta. Prominent central pulmonary arteries. Mild interstitial edema with RIGHT pleural effusion and basilar atelectasis. Underlying emphysematous changes. No pneumothorax. Bones demineralized. IMPRESSION: Enlargement of cardiac silhouette with pulmonary vascular congestion post AVR and MVR. COPD changes with  superimposed mild pulmonary edema, RIGHT pleural effusion and RIGHT basilar atelectasis. Electronically Signed   By: Lavonia Dana M.D.   On: 01/20/2018 14:19   Dg Abd Portable 1v  Result Date: 01/21/2018 CLINICAL DATA:  Small-bowel obstruction EXAM: PORTABLE ABDOMEN - 1 VIEW COMPARISON:  01/20/2018 FINDINGS: Normal bowel gas pattern.  No dilated bowel loops. Right pleural effusion and right lower lobe airspace disease IMPRESSION: Normal bowel gas pattern. Electronically Signed   By: Franchot Gallo M.D.   On: 01/21/2018 10:45   Dg Abd Portable 1v  Result Date: 01/20/2018 CLINICAL DATA:  Abdominal pain since 01/09/2018. EXAM: PORTABLE ABDOMEN - 1 VIEW COMPARISON:  CT abdomen and pelvis today. FINDINGS: Mild dilatation of small bowel seen on CT today is difficult to appreciate on this examination. Gas is seen throughout almost the entire colon. No unexpected abdominal calcification or focal bony abnormality. IMPRESSION: Mild small bowel dilatation seen on CT scan scanned today is difficult to appreciate on plain film. The bowel gas pattern is benign. Electronically Signed   By: Inge Rise M.D.   On: 01/20/2018 11:21    Assessment and Plan:   1. Valvular heart disease status post redo aortic valve replacement x3 and redo mitral valve surgery x2. - No significant murmur heard. Her INR is treading up since admit 5.98>>6.24>>6.92 despite holding coumadin. Likely due to acute GI issue.  No bleeding but has no BM since few days.   2. Permanent atrial fibrillation - Rate in 90-10s on IV metoprolol Q 8 hours and digoxin 0.125mg  qd.   3. Chronic LE edema (Multifactorial with diastolic CHF, venous insufficiency, cirrhosis) - Diuretics on hold. No overt volume overload by exam.   4. SBO - Started clear liquid today. No passing gas yet.   Dr. Meda Coffee to see later today.    For questions or updates, please contact [place] Please consult [email or web address] for contact info under Cardiology/STEMI.    Jarrett Soho, [place]  01/21/2018 1:41 PM    The patient was seen, examined and discussed with Bhagat,Bhavinkumar PA-C and I agree with the above.   [place]  y.o. female with a hx of rheumatic valvular heart disease status post redo aortic valve replacement x3 and redo mitral valve surgery x2 (She underwent redo open heart surgery in December 2012 at [facility]. This included redo aortic valve replacement with a Medtronic freestyle aortic root conduit. She also underwent tricuspid valve annuloplasty and repair. The mitral valve replacement was unchanged. She had initial surgery for rheumatic heart disease in 1982 with both aortic and mitral valve replacements with Bjork Shiley valves. She had repeat surgery in 1995 with a homograft aortic valve and a St. Jude mechanical mitral valve replacement. She is on chronic Coumadin), persistent atrial fibrillation, coumadin for anticoagulation, chronic LE edema (multifactorial with diastolic CHF, venous insufficiency, cirrhosis)  to who is being seen today for the evaluation of CHF, afib and anticoagualtion.  She is followed by Dr Chung, last seen on 12/17/17.   She was admitted with SBO, nausea, vomiting and poor oral intake x 2 weeks, also was treated with cipro by PCP for possible UTI taht led to diarrhea. Her INR is treading up since admit 5.98>>6.24>>6.92. Coumadin on hold. Denies any blood in stool prior to presentation. No BM here. Her diuretics on hold. Receiving IV fluids. HR in 90-120s on IV lopressor 2.5mg  q 8 hours.   Physical exam shows no JVD, no acute distress, iRRR, loud click of a mechanical valve, mild systolic murmur. Crackles B/L basis. No LE edema.  Plan: - I would avoid use of Vitamin K given mechanical mitral valve prosthesis unless INR continues to rise and there is evidence of bleeding, Hb is stable at 9.5 - I would start lasix 20 mg iv BID and follow closely, replace potassium, continue metoprolol 5 m iv  Q6H   Ena Dawley, MD 01/21/2018

## 2018-01-22 ENCOUNTER — Inpatient Hospital Stay (HOSPITAL_COMMUNITY): Payer: Medicare Other

## 2018-01-22 DIAGNOSIS — R109 Unspecified abdominal pain: Secondary | ICD-10-CM

## 2018-01-22 LAB — CBC
HCT: 25.4 % — ABNORMAL LOW (ref 36.0–46.0)
Hemoglobin: 8.7 g/dL — ABNORMAL LOW (ref 12.0–15.0)
MCH: 35.2 pg — AB (ref 26.0–34.0)
MCHC: 34.3 g/dL (ref 30.0–36.0)
MCV: 102.8 fL — AB (ref 78.0–100.0)
PLATELETS: 176 10*3/uL (ref 150–400)
RBC: 2.47 MIL/uL — ABNORMAL LOW (ref 3.87–5.11)
RDW: 13.5 % (ref 11.5–15.5)
WBC: 21.3 10*3/uL — ABNORMAL HIGH (ref 4.0–10.5)

## 2018-01-22 LAB — BASIC METABOLIC PANEL
Anion gap: 9 (ref 5–15)
BUN: 29 mg/dL — AB (ref 6–20)
CHLORIDE: 98 mmol/L — AB (ref 101–111)
CO2: 24 mmol/L (ref 22–32)
CREATININE: 1.29 mg/dL — AB (ref 0.44–1.00)
Calcium: 7.7 mg/dL — ABNORMAL LOW (ref 8.9–10.3)
GFR calc Af Amer: 43 mL/min — ABNORMAL LOW (ref >60)
GFR calc non Af Amer: 37 mL/min — ABNORMAL LOW (ref >60)
Glucose, Bld: 111 mg/dL — ABNORMAL HIGH (ref 65–99)
Potassium: 3.4 mmol/L — ABNORMAL LOW (ref 3.5–5.1)
SODIUM: 131 mmol/L — AB (ref 135–145)

## 2018-01-22 LAB — PROTIME-INR
INR: 9.49
INR: 9.7
PROTHROMBIN TIME: 76 s — AB (ref 11.4–15.2)
Prothrombin Time: 77.3 seconds — ABNORMAL HIGH (ref 11.4–15.2)

## 2018-01-22 LAB — MAGNESIUM: MAGNESIUM: 1.6 mg/dL — AB (ref 1.7–2.4)

## 2018-01-22 MED ORDER — IOPAMIDOL (ISOVUE-300) INJECTION 61%
INTRAVENOUS | Status: AC
  Filled 2018-01-22: qty 30

## 2018-01-22 MED ORDER — POTASSIUM CHLORIDE CRYS ER 20 MEQ PO TBCR
40.0000 meq | EXTENDED_RELEASE_TABLET | Freq: Once | ORAL | Status: AC
  Administered 2018-01-22: 40 meq via ORAL
  Filled 2018-01-22: qty 2

## 2018-01-22 MED ORDER — POTASSIUM CHLORIDE CRYS ER 20 MEQ PO TBCR
40.0000 meq | EXTENDED_RELEASE_TABLET | Freq: Once | ORAL | Status: AC
Start: 2018-01-22 — End: 2018-01-22
  Administered 2018-01-22: 40 meq via ORAL
  Filled 2018-01-22: qty 2

## 2018-01-22 MED ORDER — PIPERACILLIN-TAZOBACTAM 3.375 G IVPB
3.3750 g | Freq: Three times a day (TID) | INTRAVENOUS | Status: DC
  Administered 2018-01-22 – 2018-02-06 (×45): 3.375 g via INTRAVENOUS
  Filled 2018-01-22 (×49): qty 50

## 2018-01-22 MED ORDER — IOPAMIDOL (ISOVUE-300) INJECTION 61%
INTRAVENOUS | Status: AC
  Administered 2018-01-22: 80 mL
  Filled 2018-01-22: qty 100

## 2018-01-22 MED ORDER — MAGNESIUM SULFATE 2 GM/50ML IV SOLN
2.0000 g | Freq: Once | INTRAVENOUS | Status: AC
  Administered 2018-01-22: 2 g via INTRAVENOUS
  Filled 2018-01-22: qty 50

## 2018-01-22 NOTE — Progress Notes (Signed)
Contacted about elevated INR to 9.5 in setting of tissue AV and mechanical MV. From literature review with INR<10 in absence of bleeding would continue to hold coumadin, would not give vitamin K, particularly in setting of mechanical MV. If INR >10 then would consider low dose vitamin K. This will be reassessed on rounds today   Carlyle Dolly MD

## 2018-01-22 NOTE — Progress Notes (Signed)
Pharmacy Antibiotic Note  [name] Chung is a 84 y.o. female admitted on 01/18/2018 with sepsis.  Pharmacy has been consulted for zosyn dosing. Recently treated for UTI with Cipro. SCr 1.88 on admit, CrCl~18. Scr now 1.29 CrCl ~30  Plan: Increase zosyn to 3.375gm IV q8h (4 hour infusion) for improved renal function Monitor clinical progress, c/s, renal function F/u de-escalation plan/LOT   Height: 5' 5.5" (166.4 cm) Weight: 140 lb 14 oz (63.9 kg) IBW/kg (Calculated) : 58.15  Temp (24hrs), Avg:98.7 F (37.1 C), Min:98.1 F (36.7 C), Max:99.6 F (37.6 C)  Recent Labs  Lab 01/23/2018 1544 01/22/2018 2240 01/20/18 0118 01/20/18 0500 01/21/18 0831 01/22/18 0601  WBC 26.5*  --   --  26.0* 24.3* 21.3*  CREATININE 1.88*  --   --  1.75* 1.35* 1.29*  LATICACIDVEN  --  1.9 1.1  --   --   --     Estimated Creatinine Clearance: 30.4 mL/min (A) (by C-G formula based on SCr of 1.29 mg/dL (H)).    Allergies  Allergen Reactions  . Asa Buff (Mag [Buffered Aspirin] Other (See Comments)    On coumadin  . Codeine   . Mirtazapine Nausea Only  . Ultram [Tramadol Hcl]     Kaikoa Magro A. Levada Dy, PharmD, [place] Pager: [phone]  01/22/2018 10:11 AM

## 2018-01-22 NOTE — Progress Notes (Signed)
[place] for heparin Indication: atrial fibrillation and mechanical valve  Heparin Dosing Weight: 51.7 kg  Labs: Recent Labs    01/04/2018 2240 01/20/18 0500 01/20/18 1233 01/20/18 2024 01/21/18 0831 01/22/18 0601  HGB  --  9.2*  --   --  9.5* 8.7*  HCT  --  27.3*  --   --  28.3* 25.4*  PLT  --  168  --   --  191 176  APTT 98*  --   --   --   --   --   LABPROT 53.0*  --  54.8*  --  59.4* 76.0*  INR 5.98*  --  6.24*  --  6.92* 9.49*  HEPARINUNFRC  --   --   --  <0.10*  --   --   CREATININE  --  1.75*  --   --  1.35* 1.29*    Assessment: 26 yof on warfarin PTA for afib and mechanical valve. Pharmacy consulted to dose heparin. INR on admit 5.98, Hg 10, plt wnl. No bleed documented. Last dose of warfarin 4/17 per med rec. Today 9.49. No bleeding noted, Hgb stable. Agree with low dose vit K when INr >10 in absence of bleeding.   Goal of Therapy:  INR 2.5-3.5 Monitor platelets by anticoagulation protocol: Yes   Plan:  Heparin when INR<2.5 Monitor CBC, s/sx bleeding  Malike Foglio A. Levada Dy, PharmD, [place] Pager: [phone]  01/22/2018 10:09 AM

## 2018-01-22 NOTE — Progress Notes (Signed)
CRITICAL VALUE ALERT  Critical Value:  INR  9.7  Date & Time Notied:  01/22/2018 2205  Provider Notified: X. Blount, NP  Orders Received/Actions taken: Awaiting response

## 2018-01-22 NOTE — Progress Notes (Signed)
CRITICAL VALUE ALERT  Critical Value:  INR 9.49  Date & Time Notied:  01/22/2018 8:15  Provider Notified: Dr. Algis Liming and Cardiology on call  Orders Received/Actions taken: Orders from Dr. Algis Liming to page cardiology

## 2018-01-22 NOTE — Progress Notes (Signed)
PROGRESS NOTE   [name] Chung  BPZ:[id number]    DOB: [birth year]    DOA: 01/28/2018  PCP: Lajean Manes, MD   I have briefly reviewed patients previous medical records in [facility].  Brief Narrative:  84 year old married female, lives at [employer] in an apartment with her spouse who has dementia, independent, PMH of chronic A. fib, mechanical AVR & MVR on Coumadin, chronic diastolic CHF (Dr. Martinique, Cardiology), stage III chronic kidney disease, chronic lower extremity edema, hysterectomy presented to ED 01/04/2018 with 3 days history of nausea, nonbloody emesis, abdominal distention and pain for which she was seen by PCP 2 days PTA and started on Cipro for presumed UTI but UA returned negative so Cipro was stopped and also reported diarrhea.  PCP started Imodium with improvement in diarrhea.  In ED, CT abdomen and pelvis suggestive of PSBO.  General surgery was consulted and patient admitted for further evaluation and management.   Assessment & Plan:   Principal Problem:   Partial small bowel obstruction (HCC) Active Problems:   Chronic atrial fibrillation (HCC)   Long term current use of anticoagulant therapy   S/P MVR (mitral valve replacement)   S/P AVR (aortic valve replacement)   Acute renal failure superimposed on stage 3 chronic kidney disease (HCC)   Atrial fibrillation with RVR (HCC)   Chronic systolic (congestive) heart failure (HCC)   Hyponatremia   Sepsis (HCC)   Liver cirrhosis (HCC)   Partial small bowel obstruction versus gastroenteritis: Unclear etiology.  History of hysterectomy.  General surgery was consulted and recommend conservative management with NPO except meds with sips and ice chips, mobilize, monitor closely. No acute surgical needs seen and patient will be a high risk if surgery needed.  Follow-up abdominal x-ray 4/18 and 4/19 without obstruction. Ongoing significant lower abdominal despite conservative management and treatment of acute urinary  retention.  Etiology of her abdominal pain is not clear.  I discussed with Dr. Coralie Keens, general surgery who recommends CT abdomen with p.o. and IV contrast.  I discussed in detail with patient, stepdaughter and caregiver at bedside regarding recommendations and potential risk of worsening renal insufficiency from contrast.  They wish to proceed with CT, ordered.  Acute urinary retention: Having recurrent acute urinary retention despite in and out Foley catheter x3 and hence Foley catheter was left in place.  Likely precipitated by acute pain and opioids.  Continue Foley for now.  Nausea, vomiting and diarrhea: Possibly related to PSBO versus acute GE.  No BM for 72 hours now.  Tolerating clears with occasional nausea but no vomiting.  Passing small flatus.  No BM.  SIRS: Although patient had tachycardia, tachypnea and leukocytosis, no clear focus of infection.  Do not think that she had sepsis on admission.  Lactate normal.  Remained hemodynamically stable.  Denies urinary symptoms and UA not consistent with UTI.  PCT 24.83.  Chest x-ray without pneumonia.  Was empirically started on IV Zosyn on admission, continue given significant leukocytosis and concern for enteritis.   Chronic A. fib with RVR: CHA2DS2-VASc Score is 4.  Presented with heart rate in the 110s-130s, likely related to PSBO associated symptoms.  Continue digoxin.  Digoxin level low.  IV metoprolol 2.5 mg every 8 hours started.  Patient sees Dr.  Martinique, cardiology.  Cardiology input appreciated.  IV metoprolol increased to 5 mg every 8 hours.  Rate better controlled.  Supratherapeutic INR: Progressively increased to 9.49 on 4/19.  Likely related to poor oral intake  in the context of GI symptoms and acute illness.  Holding Coumadin.  Agree with cardiology regarding holding Coumadin since INR <10, no bleeding and presence of mechanical valve.  Follow INR daily.  Status post mechanical MVR & AVR: Management as above.  Acute on  chronic diastolic CHF: TTE 37/85/88 shows LVEF 60-65%.  Patient reports chronic lower extremity edema.  Diuretics including Lasix, metolazone, HCTZ, spironolactone temporarily held due to GI losses, acute kidney injury and SIRS nature.  Cardiology input appreciated.  Has been started on IV Lasix 20 mg twice daily.  Diuresing.  Improved.  Acute on stage III chronic kidney disease: Baseline creatinine 1-1.3.  Patient presented with creatinine of 1.88.  Likely prerenal due to dehydration from GI losses and diuretics.  Temporarily hold diuretics.  Improving.  Creatinine down to 1.29.  Likely her baseline.  Follow BMP daily.  Dehydration with hyponatremia: Secondary to GI losses and diuretics.  Management as above.  Sodium is improved from 128-131.  TSH normal.  Hyponatremia stable.  Dehydration resolved.  Liver cirrhosis: Continue rifaximin.  Ammonia level normal.  Microcytic anemia: Unclear etiology.  Hemoglobin gradually drifting down/8.7 in the absence of overt bleeding.  Leukocytosis:?  Stress response. No overt source of sepsis noted.  Slowly improving, down to 21 today.  Urine culture with insignificant growth.  Blood cultures x3: Negative to date.  Continue empiric Zosyn for another day and reassess.  Weight loss: Reports 16 pound weight loss since November 2018.  May be multifactorial related to aggressive diuresis for leg edema/CHF, poor oral intake related to intermittent nausea and vomiting from potassium supplements.  Unable to remember if she had colonoscopy but has been advised that she is not a good candidate by her PCP/GI (Dr. Paulita Fujita).  Outpatient follow-up.  Hypokalemia/hypomagnesemia: Replace and follow.   DVT prophylaxis: Supratherapeutic INR on Coumadin. Code Status: Full. Family Communication: Discussed in detail with patient's step daughter and caregiver at bedside.  Updated all care and answered questions. Disposition: DC home pending clinical improvement.   Consultants:    General surgery Cardiology  Procedures:  In and out catheterization as needed for urinary retention. TED hose.  Antimicrobials:  IV Zosyn   Subjective: Overnight events noted.  Foley catheter left in place due to ongoing acute urinary retention.  Ongoing significant lower abdominal pain without much relief despite Foley.  Minimal flatus.  Tolerating liquids with intermittent nausea but no vomiting.  No BM since admission.  No chest pain or dyspnea reported.  ROS: As above  Objective:  Vitals:   01/22/18 0530 01/22/18 0815 01/22/18 1000 01/22/18 1004  BP: 131/63  (!) 125/44   Pulse: (!) 110  (!) 114 (!) 118  Resp: 17  19   Temp:  98.1 F (36.7 C)    TempSrc:  Oral    SpO2: 94%  95%   Weight:      Height:        Examination:  General exam: Pleasant elderly female, moderately built and nourished, lying comfortably supine in bed.  Oral mucosa moist.  Appears uncomfortable at times. Respiratory system: Occasional basal crackles but otherwise clear to auscultation.  No increased work of breathing.  Stable without change. Cardiovascular system: S1 and S2 heard, irregularly irregular.  No JVD or murmurs.  Trace bilateral ankle edema.  Telemetry personally reviewed.  A. fib with rates ranging between 90s-occasionally in the 110s. Gastrointestinal system: Mild abdominal distention, especially lower abdomen, diffuse mild tenderness without rigidity but some guarding and no rebound.  Normal bowel sounds heard. Central nervous system: Alert and oriented. No focal neurological deficits. Extremities: Symmetric 5 x 5 power. Skin: No rashes, lesions or ulcers Psychiatry: Judgement and insight appear impaired. Mood & affect appropriate.     Data Reviewed: I have personally reviewed following labs and imaging studies  CBC: Recent Labs  Lab 01/24/2018 1544 01/20/18 0500 01/21/18 0831 01/22/18 0601  WBC 26.5* 26.0* 24.3* 21.3*  HGB 10.0* 9.2* 9.5* 8.7*  HCT 29.4* 27.3* 28.3* 25.4*   MCV 103.2* 105.0* 104.0* 102.8*  PLT 194 168 191 829   Basic Metabolic Panel: Recent Labs  Lab 01/11/2018 1544 01/20/18 0500 01/21/18 0831 01/22/18 0601  NA 128* 131* 134* 131*  K 3.6 3.6 3.3* 3.4*  CL 92* 97* 98* 98*  CO2 24 25 26 24   GLUCOSE 141* 109* 97 111*  BUN 27* 32* 30* 29*  CREATININE 1.88* 1.75* 1.35* 1.29*  CALCIUM 8.2* 7.8* 8.2* 7.7*  MG  --   --   --  1.6*   Liver Function Tests: Recent Labs  Lab 01/18/2018 1544 01/21/18 0831  AST 40 37  ALT 17 16  ALKPHOS 69 68  BILITOT 1.5* 1.8*  PROT 6.7 6.4*  ALBUMIN 2.5* 2.2*   Coagulation Profile: Recent Labs  Lab 01/15/2018 2240 01/20/18 1233 01/21/18 0831 01/22/18 0601  INR 5.98* 6.24* 6.92* 9.49*    CBG: Recent Labs  Lab 01/20/18 0822 01/21/18 0745  GLUCAP 91 92    Recent Results (from the past 240 hour(s))  Urine Culture     Status: Abnormal   Collection Time: 01/10/2018 10:19 PM  Result Value Ref Range Status   Specimen Description URINE, RANDOM  Final   Special Requests NONE  Final   Culture (A)  Final    <10,000 COLONIES/mL INSIGNIFICANT GROWTH Performed at [facility], [address] [place], [place] [address]    Report Status 01/21/2018 FINAL  Final  Culture, blood (x 2)     Status: None (Preliminary result)   Collection Time: 01/09/2018 10:46 PM  Result Value Ref Range Status   Specimen Description BLOOD RIGHT ANTECUBITAL  Final   Special Requests   Final    BOTTLES DRAWN AEROBIC AND ANAEROBIC Blood Culture adequate volume   Culture   Final    NO GROWTH 2 DAYS Performed at [facility], [place] [address], [place], [place] [address]    Report Status PENDING  Incomplete  Culture, blood (Routine X 2) w Reflex to ID Panel     Status: None (Preliminary result)   Collection Time: 01/20/18 12:24 PM  Result Value Ref Range Status   Specimen Description BLOOD RIGHT HAND  Final   Special Requests   Final    BOTTLES DRAWN AEROBIC AND ANAEROBIC Blood Culture adequate volume   Culture    Final    NO GROWTH 2 DAYS Performed at [facility], [place] [address] [place], [place] [address]    Report Status PENDING  Incomplete  Culture, blood (Routine X 2) w Reflex to ID Panel     Status: None (Preliminary result)   Collection Time: 01/20/18  8:24 PM  Result Value Ref Range Status   Specimen Description BLOOD RIGHT ARM  Final   Special Requests   Final    BOTTLES DRAWN AEROBIC ONLY Blood Culture adequate volume   Culture   Final    NO GROWTH 2 DAYS Performed at [facility], [place] [address], [place], [place] [address]  Report Status PENDING  Incomplete         Radiology Studies: Dg Abd Portable 1v  Result Date: 01/21/2018 CLINICAL DATA:  Small-bowel obstruction EXAM: PORTABLE ABDOMEN - 1 VIEW COMPARISON:  01/20/2018 FINDINGS: Normal bowel gas pattern.  No dilated bowel loops. Right pleural effusion and right lower lobe airspace disease IMPRESSION: Normal bowel gas pattern. Electronically Signed   By: Franchot Gallo M.D.   On: 01/21/2018 10:45        Scheduled Meds: . digoxin  0.125 mg Oral Daily  . furosemide  20 mg Intravenous BID  . iopamidol      . mouth rinse  15 mL Mouth Rinse BID  . metoprolol tartrate  2.5 mg Intravenous Q8H  . rifaximin  550 mg Oral BID  . traZODone  100 mg Oral QHS   Continuous Infusions: . piperacillin-tazobactam (ZOSYN)  IV       LOS: 3 days     Vernell Leep, MD, FACP, [facility] Pager [phone]  If 7PM-7AM, please contact night-coverage [email or web address] Password [facility] 01/22/2018, 3:58 PM

## 2018-01-22 NOTE — Progress Notes (Signed)
Progress Note  Patient Name: [name] Chung Date of Encounter: 01/22/2018  Primary Cardiologist: Dr. Peter Martinique  Subjective   Resting, had received morphine.  Spoke with patient's daughter-in-law.  Inpatient Medications    Scheduled Meds: . digoxin  0.125 mg Oral Daily  . furosemide  20 mg Intravenous BID  . iopamidol      . mouth rinse  15 mL Mouth Rinse BID  . metoprolol tartrate  2.5 mg Intravenous Q8H  . rifaximin  550 mg Oral BID  . traZODone  100 mg Oral QHS   Continuous Infusions: . piperacillin-tazobactam (ZOSYN)  IV     PRN Meds: acetaminophen **OR** acetaminophen, hydrALAZINE, morphine injection, ondansetron (ZOFRAN) IV, zolpidem   Vital Signs    Vitals:   01/22/18 0530 01/22/18 0815 01/22/18 1000 01/22/18 1004  BP: 131/63  (!) 125/44   Pulse: (!) 110  (!) 114 (!) 118  Resp: 17  19   Temp:  98.1 F (36.7 C)    TempSrc:  Oral    SpO2: 94%  95%   Weight:      Height:        Intake/Output Summary (Last 24 hours) at 01/22/2018 1206 Last data filed at 01/22/2018 1007 Gross per 24 hour  Intake 455.33 ml  Output 2027 ml  Net -1571.67 ml   Filed Weights   01/31/2018 2200 01/20/18 1958 01/21/18 0500  Weight: 113 lb 15.7 oz (51.7 kg) 141 lb (64 kg) 140 lb 14 oz (63.9 kg)    Telemetry    Atrial fibrillation.  Personally reviewed.  ECG    Tracing from 01/08/2018 showed atrial fibrillation with RVR and diffuse nonspecific ST-T changes.  Personally reviewed.  Physical Exam   GEN:  Elderly woman.  No acute distress.   Neck: No JVD. Cardiac:  Irregularly irregular, valve click with 2/6 systolic murmur, no gallop.  Respiratory: Nonlabored.  Decreased breath sounds at bases. GI: Soft, nontender, bowel sounds present. MS:  Mild leg edema; No deformity.  Labs    Chemistry Recent Labs  Lab 01/17/2018 1544 01/20/18 0500 01/21/18 0831 01/22/18 0601  NA 128* 131* 134* 131*  K 3.6 3.6 3.3* 3.4*  CL 92* 97* 98* 98*  CO2 24 25 26 24   GLUCOSE 141* 109*  97 111*  BUN 27* 32* 30* 29*  CREATININE 1.88* 1.75* 1.35* 1.29*  CALCIUM 8.2* 7.8* 8.2* 7.7*  PROT 6.7  --  6.4*  --   ALBUMIN 2.5*  --  2.2*  --   AST 40  --  37  --   ALT 17  --  16  --   ALKPHOS 69  --  68  --   BILITOT 1.5*  --  1.8*  --   GFRNONAA 24* 26* 35* 37*  GFRAA 27* 30* 41* 43*  ANIONGAP 12 9 10 9      Hematology Recent Labs  Lab 01/20/18 0500 01/21/18 0831 01/22/18 0601  WBC 26.0* 24.3* 21.3*  RBC 2.60* 2.72* 2.47*  HGB 9.2* 9.5* 8.7*  HCT 27.3* 28.3* 25.4*  MCV 105.0* 104.0* 102.8*  MCH 35.4* 34.9* 35.2*  MCHC 33.7 33.6 34.3  RDW 13.6 13.8 13.5  PLT 168 191 176    BNP Recent Labs  Lab 01/09/2018 2240  BNP 159.9*     Radiology    Dg Chest 2 View  Result Date: 01/20/2018 CLINICAL DATA:  Systemic inflammatory response syndrome, shortness of breath since having valve replacement surgery EXAM: CHEST - 2 VIEW COMPARISON:  01/17/2015  FINDINGS: Enlargement of cardiac silhouette with pulmonary vascular congestion post median sternotomy, AVR and MVR. Atherosclerotic calcification aorta. Prominent central pulmonary arteries. Mild interstitial edema with RIGHT pleural effusion and basilar atelectasis. Underlying emphysematous changes. No pneumothorax. Bones demineralized. IMPRESSION: Enlargement of cardiac silhouette with pulmonary vascular congestion post AVR and MVR. COPD changes with superimposed mild pulmonary edema, RIGHT pleural effusion and RIGHT basilar atelectasis. Electronically Signed   By: Lavonia Dana M.D.   On: 01/20/2018 14:19   Dg Abd Portable 1v  Result Date: 01/21/2018 CLINICAL DATA:  Small-bowel obstruction EXAM: PORTABLE ABDOMEN - 1 VIEW COMPARISON:  01/20/2018 FINDINGS: Normal bowel gas pattern.  No dilated bowel loops. Right pleural effusion and right lower lobe airspace disease IMPRESSION: Normal bowel gas pattern. Electronically Signed   By: Franchot Gallo M.D.   On: 01/21/2018 10:45    Cardiac Studies   Echocardiogram 08/19/2017: Study  Conclusions  - Left ventricle: The cavity size was normal. Wall thickness was   normal. Systolic function was normal. The estimated ejection   fraction was in the range of 60% to 65%. Wall motion was normal;   there were no regional wall motion abnormalities. The study is   not technically sufficient to allow evaluation of LV diastolic   function. - Ventricular septum: Septal motion showed paradox. - Aortic valve: Valve area (VTI): 0.65 cm^2. Valve area (Vmax):   0.69 cm^2. Valve area (Vmean): 0.66 cm^2. - Mitral valve: A mechanical prosthesis was present. Valve area by   continuity equation (using LVOT flow): 0.54 cm^2. - Left atrium: The atrium was mildly dilated. - Right ventricle: Systolic function was mildly reduced. - Right atrium: The atrium was mildly dilated. - Atrial septum: No defect or patent foramen ovale was identified. - Tricuspid valve: Increased transvalvular gradients, suspect   previous annuloplasty repair. - Pulmonic valve: Peak gradient (S): 14 mm Hg. - Pulmonary arteries: Systolic pressure was mildly increased. - Pericardium, extracardiac: There was a right pleural effusion.  Impressions:  - High flow velocities are seen across all four cardiac valves,   suggestive of increased cardiac output (e.g. anemia,   thyrotoxicosis, infection, etc.). Gradients are measured at a   mean heart rate of 88 bpm. Relatively fast heart rate also   exaggerates gradients across the mitral and tricuspid valves..   The previous report described moderate to severe and perivalvular   aortic insufficiency, but no aortic insufficiency is seen on the   current study (suspect interval aortic valve surgery).Marland Kitchen LVOT flow   velocities were not accurately measured - this limits the ability   to calculate the cardiac output and fully evaluate valve   hemodynamics.  Patient Profile     84 y.o. female with a history of rheumatic valvular heart disease status post redo aortic valve  replacement x3 and redo mitral valve surgery x2, persistent atrial fibrillation, coumadin for anticoagulation, chronic LE edema (multifactorial with diastolic CHF, venous insufficiency, cirrhosis) now admitted with SBO.  We are assisting with management of diastolic heart failure and atrial fibrillation.  Assessment & Plan    1.  Rheumatic heart disease status post aortic and mitral valve replacement with redo operations, currently has a [employer] mechanical mitral prosthesis and a [employer] freestyle aortic root conduit.  Coumadin is on hold although INR remains supratherapeutic, in fact up to 9.49 today.  No active bleeding.  2.  Acute on chronic diastolic heart failure.  Chest x-ray with mild pulmonary edema and right pleural effusion.  Net output of approximately 1100  cc last 24 hours on IV Lasix.  3.  Small bowel obstruction.  Has been n.p.o. but plan to advance diet based on Surgery note.  4.  Chronic atrial fibrillation.  On IV Lopressor for heart rate control.  Also oral Lanoxin.  Reviewed consultation note from Dr. Meda Coffee and also follow-up note by Dr. Harl Bowie regarding patient's INR.  I would agree for now in holding off on giving vitamin K since she has no active bleeding and has mechanical valve prostheses.  Continue IV Lasix, creatinine has actually been coming down and she is diuresing well.  Once certain that she is tolerating orals well, can convert medications.  Signed, Rozann Lesches, MD  01/22/2018, 12:06 PM

## 2018-01-22 NOTE — Progress Notes (Signed)
Subjective/Chief Complaint: She reports that she is still having lower abdominal pain. She denies any bowel movement.  Does report flatus   Objective: Vital signs in last 24 hours: Temp:  [98.1 F (36.7 C)-99.6 F (37.6 C)] 98.1 F (36.7 C) (04/20 0815) Pulse Rate:  [91-123] 110 (04/20 0530) Resp:  [14-23] 17 (04/20 0530) BP: (116-145)/(43-63) 131/63 (04/20 0530) SpO2:  [91 %-100 %] 94 % (04/20 0530) FiO2 (%):  [2 %] 2 % (04/19 1435) Last BM Date: 01/18/18  Intake/Output from previous day: 04/19 0701 - 04/20 0700 In: 505.3 [P.O.:200; I.V.:105.3; IV Piggyback:200] Out: 4696 [Urine:1652] Intake/Output this shift: No intake/output data recorded.  Exam: Abdomen with moderate diffuse tenderness with some guarding  Lab Results:  Recent Labs    01/21/18 0831 01/22/18 0601  WBC 24.3* 21.3*  HGB 9.5* 8.7*  HCT 28.3* 25.4*  PLT 191 176   BMET Recent Labs    01/21/18 0831 01/22/18 0601  NA 134* 131*  K 3.3* 3.4*  CL 98* 98*  CO2 26 24  GLUCOSE 97 111*  BUN 30* 29*  CREATININE 1.35* 1.29*  CALCIUM 8.2* 7.7*   PT/INR Recent Labs    01/21/18 0831 01/22/18 0601  LABPROT 59.4* 76.0*  INR 6.92* 9.49*   ABG No results for input(s): PHART, HCO3 in the last 72 hours.  Invalid input(s): PCO2, PO2  Studies/Results: Dg Chest 2 View  Result Date: 01/20/2018 CLINICAL DATA:  Systemic inflammatory response syndrome, shortness of breath since having valve replacement surgery EXAM: CHEST - 2 VIEW COMPARISON:  01/17/2015 FINDINGS: Enlargement of cardiac silhouette with pulmonary vascular congestion post median sternotomy, AVR and MVR. Atherosclerotic calcification aorta. Prominent central pulmonary arteries. Mild interstitial edema with RIGHT pleural effusion and basilar atelectasis. Underlying emphysematous changes. No pneumothorax. Bones demineralized. IMPRESSION: Enlargement of cardiac silhouette with pulmonary vascular congestion post AVR and MVR. COPD changes with  superimposed mild pulmonary edema, RIGHT pleural effusion and RIGHT basilar atelectasis. Electronically Signed   By: Lavonia Dana M.D.   On: 01/20/2018 14:19   Dg Abd Portable 1v  Result Date: 01/21/2018 CLINICAL DATA:  Small-bowel obstruction EXAM: PORTABLE ABDOMEN - 1 VIEW COMPARISON:  01/20/2018 FINDINGS: Normal bowel gas pattern.  No dilated bowel loops. Right pleural effusion and right lower lobe airspace disease IMPRESSION: Normal bowel gas pattern. Electronically Signed   By: Franchot Gallo M.D.   On: 01/21/2018 10:45   Dg Abd Portable 1v  Result Date: 01/20/2018 CLINICAL DATA:  Abdominal pain since 01/26/2018. EXAM: PORTABLE ABDOMEN - 1 VIEW COMPARISON:  CT abdomen and pelvis today. FINDINGS: Mild dilatation of small bowel seen on CT today is difficult to appreciate on this examination. Gas is seen throughout almost the entire colon. No unexpected abdominal calcification or focal bony abnormality. IMPRESSION: Mild small bowel dilatation seen on CT scan scanned today is difficult to appreciate on plain film. The bowel gas pattern is benign. Electronically Signed   By: Inge Rise M.D.   On: 01/20/2018 11:21    Anti-infectives: Anti-infectives (From admission, onward)   Start     Dose/Rate Route Frequency Ordered Stop   01/20/18 1500  piperacillin-tazobactam (ZOSYN) IVPB 2.25 g     2.25 g 100 mL/hr over 30 Minutes Intravenous Every 6 hours 01/20/18 1211     01/20/18 0500  piperacillin-tazobactam (ZOSYN) IVPB 2.25 g  Status:  Discontinued     2.25 g 100 mL/hr over 30 Minutes Intravenous Every 6 hours 01/20/2018 2220 01/20/18 1211   01/28/2018 2330  rifaximin (XIFAXAN)  tablet 550 mg     550 mg Oral 2 times daily 01/08/2018 2224     01/09/2018 2230  piperacillin-tazobactam (ZOSYN) IVPB 3.375 g     3.375 g 100 mL/hr over 30 Minutes Intravenous  Once 01/12/2018 2220 01/15/2018 2300      Assessment/Plan:  Abdominal pain uncertain etiology  Yesterday's xray showed normal bowel gas pattern  without evidence of obstruction or ileus.  WBC and INR elevated.  If not improving, would repeat her CT scan. Ok to advance po from our standpoint.   LOS: 3 days    Charita Lindenberger A 01/22/2018

## 2018-01-22 NOTE — Progress Notes (Addendum)
Ambulated patient approximately 190 feet. Patients 02 sats started dropping to mid 70s. Hooked up to 2L for the rest of the walk. Tolerated well. Not sure the 02 sats were reading appropriately, patient was asymptomatic. Back in bed now on 1.5L [place].   Rosalio Loud, RN

## 2018-01-23 ENCOUNTER — Inpatient Hospital Stay (HOSPITAL_COMMUNITY): Payer: Medicare Other

## 2018-01-23 ENCOUNTER — Inpatient Hospital Stay: Payer: Self-pay

## 2018-01-23 DIAGNOSIS — R188 Other ascites: Secondary | ICD-10-CM

## 2018-01-23 DIAGNOSIS — Z95828 Presence of other vascular implants and grafts: Secondary | ICD-10-CM

## 2018-01-23 LAB — BASIC METABOLIC PANEL
ANION GAP: 8 (ref 5–15)
BUN: 23 mg/dL — ABNORMAL HIGH (ref 6–20)
CO2: 23 mmol/L (ref 22–32)
Calcium: 7.6 mg/dL — ABNORMAL LOW (ref 8.9–10.3)
Chloride: 99 mmol/L — ABNORMAL LOW (ref 101–111)
Creatinine, Ser: 1.12 mg/dL — ABNORMAL HIGH (ref 0.44–1.00)
GFR calc Af Amer: 51 mL/min — ABNORMAL LOW (ref >60)
GFR, EST NON AFRICAN AMERICAN: 44 mL/min — AB (ref >60)
GLUCOSE: 112 mg/dL — AB (ref 65–99)
POTASSIUM: 3.8 mmol/L (ref 3.5–5.1)
Sodium: 130 mmol/L — ABNORMAL LOW (ref 135–145)

## 2018-01-23 LAB — GLUCOSE, CAPILLARY: Glucose-Capillary: 135 mg/dL — ABNORMAL HIGH (ref 65–99)

## 2018-01-23 LAB — MRSA PCR SCREENING: MRSA BY PCR: NEGATIVE

## 2018-01-23 LAB — CBC
HCT: 26.3 % — ABNORMAL LOW (ref 36.0–46.0)
HEMOGLOBIN: 9.1 g/dL — AB (ref 12.0–15.0)
MCH: 35.5 pg — ABNORMAL HIGH (ref 26.0–34.0)
MCHC: 34.6 g/dL (ref 30.0–36.0)
MCV: 102.7 fL — AB (ref 78.0–100.0)
PLATELETS: 195 10*3/uL (ref 150–400)
RBC: 2.56 MIL/uL — AB (ref 3.87–5.11)
RDW: 13.7 % (ref 11.5–15.5)
WBC: 22.2 10*3/uL — AB (ref 4.0–10.5)

## 2018-01-23 LAB — PROTIME-INR
INR: 8.98
INR: 2.69
Prothrombin Time: 72.8 seconds — ABNORMAL HIGH (ref 11.4–15.2)
Prothrombin Time: 28.4 seconds — ABNORMAL HIGH (ref 11.4–15.2)

## 2018-01-23 LAB — CA 125: CANCER ANTIGEN (CA) 125: 184.3 U/mL — AB (ref 0.0–38.1)

## 2018-01-23 LAB — CEA: CEA: 5.6 ng/mL — ABNORMAL HIGH (ref 0.0–4.7)

## 2018-01-23 LAB — PHOSPHORUS: Phosphorus: 2.5 mg/dL (ref 2.5–4.6)

## 2018-01-23 LAB — MAGNESIUM: Magnesium: 1.9 mg/dL (ref 1.7–2.4)

## 2018-01-23 MED ORDER — SODIUM CHLORIDE 0.9% FLUSH
10.0000 mL | Freq: Two times a day (BID) | INTRAVENOUS | Status: DC
  Administered 2018-01-23 – 2018-02-04 (×14): 10 mL

## 2018-01-23 MED ORDER — PHYTONADIONE 5 MG PO TABS
2.5000 mg | ORAL_TABLET | Freq: Once | ORAL | Status: AC
  Administered 2018-01-23: 2.5 mg via ORAL
  Filled 2018-01-23: qty 1

## 2018-01-23 MED ORDER — TRAVASOL 10 % IV SOLN
INTRAVENOUS | Status: AC
  Administered 2018-01-23: 17:00:00 via INTRAVENOUS
  Filled 2018-01-23: qty 388.8

## 2018-01-23 MED ORDER — INSULIN ASPART 100 UNIT/ML ~~LOC~~ SOLN
0.0000 [IU] | Freq: Four times a day (QID) | SUBCUTANEOUS | Status: DC
  Administered 2018-01-23: 1 [IU] via SUBCUTANEOUS
  Administered 2018-01-24 (×4): 2 [IU] via SUBCUTANEOUS
  Administered 2018-01-25: 3 [IU] via SUBCUTANEOUS
  Administered 2018-01-25: 2 [IU] via SUBCUTANEOUS
  Administered 2018-01-25: 1 [IU] via SUBCUTANEOUS
  Administered 2018-01-25 – 2018-01-26 (×4): 2 [IU] via SUBCUTANEOUS
  Administered 2018-01-26 – 2018-01-27 (×2): 1 [IU] via SUBCUTANEOUS
  Administered 2018-01-27 – 2018-01-28 (×6): 2 [IU] via SUBCUTANEOUS
  Administered 2018-01-29: 3 [IU] via SUBCUTANEOUS
  Administered 2018-01-29: 2 [IU] via SUBCUTANEOUS

## 2018-01-23 MED ORDER — MAGNESIUM SULFATE IN D5W 1-5 GM/100ML-% IV SOLN
1.0000 g | Freq: Once | INTRAVENOUS | Status: AC
  Administered 2018-01-23: 1 g via INTRAVENOUS
  Filled 2018-01-23: qty 100

## 2018-01-23 MED ORDER — SODIUM CHLORIDE 0.9% FLUSH
10.0000 mL | INTRAVENOUS | Status: DC | PRN
  Administered 2018-01-25: 10 mL
  Administered 2018-01-26 – 2018-02-01 (×2): 20 mL
  Administered 2018-02-02 (×2): 10 mL
  Filled 2018-01-23 (×6): qty 40

## 2018-01-23 MED ORDER — POTASSIUM PHOSPHATES 15 MMOLE/5ML IV SOLN
10.0000 mmol | Freq: Once | INTRAVENOUS | Status: AC
  Administered 2018-01-23: 10 mmol via INTRAVENOUS
  Filled 2018-01-23: qty 3.33

## 2018-01-23 MED ORDER — VITAMIN K1 10 MG/ML IJ SOLN
2.5000 mg | Freq: Once | INTRAVENOUS | Status: AC
  Administered 2018-01-23: 2.5 mg via INTRAVENOUS
  Filled 2018-01-23: qty 0.25

## 2018-01-23 NOTE — Progress Notes (Addendum)
PHARMACY - ADULT TOTAL PARENTERAL NUTRITION CONSULT NOTE   Pharmacy Consult:  TPN Indication:  Prolonged ileus / Prolonged NPO status  Patient Measurements: Height: 5' 5.5" (166.4 cm) Weight: 134 lb (60.8 kg) IBW/kg (Calculated) : 58.15 TPN AdjBW (KG): 64 Body mass index is 21.96 kg/m.  Assessment:  36 YOF presented on 01/05/2018 with N/V/D and abdominal pain, found to have pSBO.  She was placed on a clear liquid diet on 01/21/18, which her caregiver reported that she didn't eat much.  01/22/18 CT concerning for perforated appendicitis and patient may need surgery; therefore, she was made NPO again.  Today is day #5 of essentially NPO status and MD suspects patient will not be able to eat adequately within the next week.  Pharmacy consulted to initiate TPN.  Caregiver is new and couldn't report nutritional status PTA.  Family has left for the day.  GI: cirrhosis with portal venous HTN.  LBM 4/17 - rifaximin, PRN Zofran Endo: no hx DM - AM glucose WNL Insulin requirements in the past 24 hours: N/A Lytes: low Na/CL prior to TPN, Phos low normal, K 3.8 (goal >/= 4 for ileus), Mag 1.9 (goal >/= 2 for ileus) Renal: SCr down 1.12, BUN 23 - UOP 1.3 ml/kg/hr Pulm: 1.5L [place] Cards: Rheumatic heart dz - BP controlled, tachy (in Afib) - digoxin, Lasix IV, IV metoprolol AC: Coumadin PTA for Afib / mech valves >> INR 8.98, Vitamin K and to transition to IV Heparin - hgb up to 9.1, plts WNL Hepatobil: LFTs WNL, tbili 1.8 Neuro: trazodone, PRN morphine - pain score 7 ID: Zosyn for appendiceal abscess/enteritis - afebrile, WBC 22.2 TPN Access: PICC to be placed 01/23/18 TPN start date: 01/23/18  Nutritional Goals (RD rec pending): 1500-1650 kCal and 75-90 gm protein per day  Current Nutrition:  NPO   Plan:  Initiate TPN at 30 ml/hr (goal ~65 ml/hr) TPN will provide 39g AA, 115g CHO and 17g ILE, which equals to 720 kCal, meeting ~half of patient's needs. Electrolytes in TPN: increase Na/Mag/Phos,  Cl:Ac 2:1, standard Ca/K Daily multivitamin and trace elements in TPN Start sensitive SSI Q6H KPhos 10 mmol IV x 1 Mag sulfate 1gm IV x 1 Standard TPN labs in AM   Ajwa Kimberley D. Mina Marble, PharmD, BCPS, BCCCP Pager:  [phone] 01/23/2018, 11:55 AM

## 2018-01-23 NOTE — Progress Notes (Signed)
RN paged Jeannette Corpus, NP with INR of 2.69.  P.J. Linus Mako, RN

## 2018-01-23 NOTE — Progress Notes (Signed)
Peripherally Inserted Central Catheter/Midline Placement  The IV Nurse has discussed with the patient and/or persons authorized to consent for the patient, the purpose of this procedure and the potential benefits and risks involved with this procedure.  The benefits include less needle sticks, lab draws from the catheter, and the patient may be discharged home with the catheter. Risks include, but not limited to, infection, bleeding, blood clot (thrombus formation), and puncture of an artery; nerve damage and irregular heartbeat and possibility to perform a PICC exchange if needed/ordered by physician.  Alternatives to this procedure were also discussed.  Bard Power PICC patient education guide, fact sheet on infection prevention and patient information card has been provided to patient /or left at bedside.  Step dtr /{POA at bedside.  Pt agreeable to PICC placement, asked her to sign due to weakness.Marland Kitchen PICC/Midline Placement Documentation  PICC Double Lumen 01/23/18 PICC Right Brachial 36 cm 0 cm (Active)  Indication for Insertion or Continuance of Line Administration of hyperosmolar/irritating solutions (i.e. TPN, Vancomycin, etc.) 01/23/2018 12:29 PM  Exposed Catheter (cm) 0 cm 01/23/2018 12:29 PM  Site Assessment Clean;Dry;Intact 01/23/2018 12:29 PM  Lumen #1 Status Flushed;Saline locked;Blood return noted 01/23/2018 12:29 PM  Lumen #2 Status Flushed;Saline locked;Blood return noted 01/23/2018 12:29 PM  Dressing Type Transparent 01/23/2018 12:29 PM  Dressing Status Clean;Intact;Dry;Antimicrobial disc in place 01/23/2018 12:29 PM  Line Care Connections checked and tightened 01/23/2018 12:29 PM  Line Adjustment (NICU/IV Team Only) No 01/23/2018 12:29 PM  Dressing Intervention New dressing 01/23/2018 12:29 PM  Dressing Change Due 01/30/18 01/23/2018 12:29 PM       Rolena Infante 01/23/2018, 12:30 PM

## 2018-01-23 NOTE — Progress Notes (Signed)
CRITICAL VALUE ALERT  Critical Value:  INR 8.98  Date & Time Notied:  01/23/18 7:32  Provider Notified: Dr. Algis Liming  Orders Received/Actions taken: Pending

## 2018-01-23 NOTE — Progress Notes (Signed)
Progress Note: General Surgery Service   Assessment/Plan: Patient Active Problem List   Diagnosis Date Noted  . Partial small bowel obstruction ([place]) 01/06/2018  . Acute renal failure superimposed on stage 3 chronic kidney disease ([place]) 01/08/2018  . Atrial fibrillation with RVR ([place]) 01/06/2018  . Chronic systolic (congestive) heart failure ([place]) 01/18/2018  . Hyponatremia 01/07/2018  . Sepsis ([place]) 01/28/2018  . Liver cirrhosis ([place]) 01/29/2018  . Edema 03/06/2014  . Encounter for therapeutic drug monitoring 10/31/2013  . S/P MVR (mitral valve replacement) 09/14/2013  . S/P AVR (aortic valve replacement) 09/14/2013  . Heart valve replaced by other means 02/03/2011  . Long term current use of anticoagulant therapy 02/03/2011  . Aortic valve disorders 01/06/2011  . Elevated BP 01/06/2011  . Valvular heart disease   . Chronic atrial fibrillation ([place])   . Chronic anticoagulation   . Rheumatic heart disease   . Aortic insufficiency   . Arthritis    CT scan from 4/20 is concerning for perforated appendicitis. Given the leukocytosis, ileus, nausea and pain, this fits with her clinical picture. -I recommend continue broad IV abx -I recommend getting her INR to <2 in order for a procedure to be considered to drain the abscess -She has not had significant calories or protein in the next week, I do not think she will tolerate oral intake to a necessary level within the next week and think she should be initiated on TPN, with PICC line placement. This could make her liver worse but I think not giving her calories or protein for another week will worsen her overall prognosis of resolving this infection -ok to ambulate and work with physical therapy    LOS: 4 days  Chief Complaint/Subjective: Moderate pain in abdomen, no nausea, no bowel movements  Objective: Vital signs in last 24 hours: Temp:  [97.7 F (36.5 C)-99.2 F (37.3 C)] 97.7 F (36.5 C) (04/21 0410) Pulse Rate:  [94-122]  122 (04/21 0410) Resp:  [17-21] 20 (04/21 0410) BP: (115-139)/(44-57) 116/57 (04/21 0410) SpO2:  [89 %-95 %] 91 % (04/21 0410) Weight:  [60.8 kg (134 lb)-64.9 kg (143 lb)] 60.8 kg (134 lb) (04/21 0410) Last BM Date: 01/10/2018  Intake/Output from previous day: 04/20 0701 - 04/21 0700 In: 800 [P.O.:700; IV Piggyback:100] Out: 1925 [Urine:1925] Intake/Output this shift: No intake/output data recorded.  Lungs: CTAB  Cardiovascular: RRR  Abd: soft, TTP diffusely  Extremities: no edema  Neuro: AOx4  Lab Results: CBC  Recent Labs    01/22/18 0601 01/23/18 0642  WBC 21.3* 22.2*  HGB 8.7* 9.1*  HCT 25.4* 26.3*  PLT 176 195   BMET Recent Labs    01/22/18 0601 01/23/18 0642  NA 131* 130*  K 3.4* 3.8  CL 98* 99*  CO2 24 23  GLUCOSE 111* 112*  BUN 29* 23*  CREATININE 1.29* 1.12*  CALCIUM 7.7* 7.6*   PT/INR Recent Labs    01/22/18 1950 01/23/18 0642  LABPROT 77.3* 72.8*  INR 9.70* 8.98*   ABG No results for input(s): PHART, HCO3 in the last 72 hours.  Invalid input(s): PCO2, PO2  Studies/Results:  Anti-infectives: Anti-infectives (From admission, onward)   Start     Dose/Rate Route Frequency Ordered Stop   01/22/18 1600  piperacillin-tazobactam (ZOSYN) IVPB 3.375 g     3.375 g 12.5 mL/hr over 240 Minutes Intravenous Every 8 hours 01/22/18 1014     01/20/18 1500  piperacillin-tazobactam (ZOSYN) IVPB 2.25 g  Status:  Discontinued     2.25 g  100 mL/hr over 30 Minutes Intravenous Every 6 hours 01/20/18 1211 01/22/18 1014   01/20/18 0500  piperacillin-tazobactam (ZOSYN) IVPB 2.25 g  Status:  Discontinued     2.25 g 100 mL/hr over 30 Minutes Intravenous Every 6 hours 01/26/2018 2220 01/20/18 1211   01/22/2018 2330  rifaximin (XIFAXAN) tablet 550 mg     550 mg Oral 2 times daily 01/26/2018 2224     01/21/2018 2230  piperacillin-tazobactam (ZOSYN) IVPB 3.375 g     3.375 g 100 mL/hr over 30 Minutes Intravenous  Once 01/29/2018 2220 01/12/2018 2300       Medications: Scheduled Meds: . digoxin  0.125 mg Oral Daily  . furosemide  20 mg Intravenous BID  . mouth rinse  15 mL Mouth Rinse BID  . metoprolol tartrate  2.5 mg Intravenous Q8H  . rifaximin  550 mg Oral BID  . traZODone  100 mg Oral QHS   Continuous Infusions: . piperacillin-tazobactam (ZOSYN)  IV 3.375 g (01/23/18 0612)   PRN Meds:.acetaminophen **OR** acetaminophen, hydrALAZINE, morphine injection, ondansetron (ZOFRAN) IV, zolpidem  Mickeal Skinner, MD Pg# [phone] [facility]

## 2018-01-23 NOTE — Progress Notes (Addendum)
PROGRESS NOTE   JOHNICE RIEBE  VOJ:[id number]    DOB: Aug 23, 1933    DOA: 01/20/2018  PCP: Lajean Manes, MD   I have briefly reviewed patients previous medical records in [facility].  Brief Narrative:  84 year old married female, lives at [employer] in an apartment with her spouse who has dementia, independent, PMH of chronic A. fib, mechanical AVR & MVR on Coumadin, chronic diastolic CHF (Dr. Martinique, Cardiology), stage III chronic kidney disease, chronic lower extremity edema, hysterectomy presented to ED 01/08/2018 with 3 days history of nausea, nonbloody emesis, abdominal distention and pain for which she was seen by PCP 2 days PTA and started on Cipro for presumed UTI but UA returned negative so Cipro was stopped and also reported diarrhea.  PCP started Imodium with improvement in diarrhea.  In ED, initial noncontrasted CT abdomen and pelvis suggestive of PSBO.  General surgery was consulted but patient did not improve with conservative management for SBO.  After renal insufficiency improved, obtain CT abdomen and pelvis with contrast on 4/20 which is suggestive of perforated appendix.  Now attempting to reverse INR with vitamin K so IR can place percutaneous drain.  Cardiology assisting for decompensated CHF, A. fib with RVR.  Assessment & Plan:   Principal Problem:   Partial small bowel obstruction (HCC) Active Problems:   Chronic atrial fibrillation (HCC)   Long term current use of anticoagulant therapy   S/P MVR (mitral valve replacement)   S/P AVR (aortic valve replacement)   Acute renal failure superimposed on stage 3 chronic kidney disease (HCC)   Atrial fibrillation with RVR (HCC)   Chronic systolic (congestive) heart failure (HCC)   Hyponatremia   Sepsis ([place])   Liver cirrhosis (HCC)   Perforated appendicular abscess and small bowel ileus: Patient had noncontrasted CT abdomen and pelvis on admission which suggested partial small bowel obstruction.  General surgery was  consulted.  She was treated conservatively for same but did not improve.  After renal insufficiency had improved, she underwent CT abdomen and pelvis with contrast on 4/20 which confirmed suspected perforated appendix.  Case was extensively discussed with patient, her strep daughter, surgical and cardiology teams.  Patient wishes to proceed with appropriate interventions as needed.  General surgery recommends percutaneous drain of abscess by IR but needs INR to be less than 2.  Initially reluctant to reverse INR due to mechanical valve.  Now due to need for surgical intervention, as coordinated with cardiology, treating with small doses of p.o./IV vitamin K to reverse INR <2 for procedure.  IV Zosyn continued.  Since patient has had poor oral intake for several days which may continue for several more, surgery ordered PICC line and TPN.  Mobilize. NPO except ice chips and meds with sips.  CEA and CA 125 pending (DD: Tumor versus ovarian carcinoma)  Acute urinary retention: She had recurrent acute urinary retention despite in and out Foley catheter x3 and hence Foley catheter was left in place.  Likely precipitated by acute pain and opioids.  Continue Foley for now.  Sepsis secondary to perforated appendix: Patient met sepsis criteria on admission.  Lactate normal. PCT 24.83.  Chest x-ray without pneumonia.  Now we know that this was from suspected Appendicular abscess.  Continue IV Zosyn.  Blood cultures x2: Negative to date.  Urine culture shows insignificant growth.  Chronic A. fib with RVR: CHA2DS2-VASc Score is 4.  Presented with heart rate in the 110s-130s, likely related to PSBO associated symptoms.  Digoxin level  low on admission.  Patient sees Dr.  Martinique, cardiology.  Cardiology follow-up appreciated.  Continue IV metoprolol 2.5 mg every 8 hourly and oral digoxin.  Rate better controlled, mostly <100-110s.  Supratherapeutic INR: Progressively increased to 9.49 on 4/19.  Likely related to poor oral  intake in the context of GI symptoms, acute illness and underlying cirrhosis.  Last dose of Coumadin was 01/22/2018.  Initially did not reverse INR due to no bleeding and presence of mechanical valve.  However now with need for surgical intervention, after discussing with cardiology, general surgery and I also discussed with hematologist on call 01/23/18 and attempting to reverse INR with small doses of IV vitamin K.  Follow INR closely.  As per hematology, does not recommend FFP unless bleeding.  When INR <2.5, initiate IV heparin per pharmacy.  Status post mechanical MVR & AVR: Management as above.  Acute on chronic diastolic CHF: TTE 81/01/75 shows LVEF 60-65%.  Patient reports chronic lower extremity edema.  Diuretics including Lasix, metolazone, HCTZ, spironolactone temporarily held due to GI losses, acute kidney injury and SIRS nature.  Cardiology input appreciated.  Has been started on IV Lasix 20 mg twice daily.  Diuresing.  Continues to improve.  Monitor closely.  Acute on stage III chronic kidney disease: Baseline creatinine 1-1.3.  Patient presented with creatinine of 1.88.  Likely prerenal due to dehydration from GI losses and diuretics.  Creatinine has improved to 1.12.  Continue to trend daily BMP.  Dehydration with hyponatremia: Secondary to GI losses and diuretics.  Management as above.  Sodium is improved from 128-131.  TSH normal.  Hyponatremia stable.  Dehydration resolved.  Liver cirrhosis and portal hypertension: Confirmed on CT abdomen 4/20.  Continue rifaximin.  Ammonia level normal.  Microcytic anemia: Unclear etiology.  Hemoglobin gradually drifting down/8.7 in the absence of overt bleeding.  Hemoglobin stable compared to yesterday.  Leukocytosis: Secondary to infectious etiology as above.  Continue Zosyn.  Weight loss: Reports 16 pound weight loss since November 2018.  May be multifactorial related to aggressive diuresis for leg edema/CHF, poor oral intake related to  intermittent nausea and vomiting from potassium supplements.  Unable to remember if she had colonoscopy but has been advised that she is not a good candidate by her PCP/GI (Dr. Paulita Fujita).  Outpatient follow-up.  Hypokalemia/hypomagnesemia: Replaced.  Follow closely.   DVT prophylaxis: Supratherapeutic INR on Coumadin. Code Status: Full. Family Communication: I have been discussing and updating patient's stepdaughter regarding patient's ongoing care on a daily basis.  I discussed with her at bedside this morning. Disposition: To be determined.  Not medically stable for discharge.   Consultants:  General surgery Cardiology  Procedures:  In and out catheterization as needed for urinary retention. TED hose. PICC line  Antimicrobials:  IV Zosyn   Subjective: Ongoing diffuse abdominal pain.  Passing some flatus.  No BM for 4 days.  N.p.o. since last night and asking for ice chips.  No dyspnea.  ROS: As above  Objective:  Vitals:   01/23/18 0035 01/23/18 0410 01/23/18 1007 01/23/18 1023  BP: (!) 115/46 (!) 116/57 (!) 132/45   Pulse: (!) 103 (!) 122 (!) 119 (!) 108  Resp: '19 20 18   '$ Temp: 98.4 F (36.9 C) 97.7 F (36.5 C) 98.2 F (36.8 C)   TempSrc: Oral Oral Oral   SpO2: 92% 91% 94%   Weight:  60.8 kg (134 lb)    Height:        Examination:  General exam: Pleasant elderly  female, moderately built and nourished lying comfortably propped up in bed.  Oral mucosa slightly dry. Respiratory system: Clear to auscultation.  No increased work of breathing. Cardiovascular system: S1 and S2 heard, irregularly irregular.  No JVD, murmurs.  Crisp click of mechanical mitral valve heard.  Telemetry personally reviewed and shows A. fib with RVR in the 100s-110s. Gastrointestinal system: No significant change compared to yesterday.  Abdomen mildly distended, diffuse mild tenderness with guarding but no rigidity or rebound.  No organomegaly or masses felt.  Bowel sounds appreciated. Central  nervous system: Alert and oriented. No focal neurological deficits.  Stable. Extremities: Symmetric 5 x 5 power. Skin: No rashes, lesions or ulcers Psychiatry: Judgement and insight appear impaired. Mood & affect appropriate.     Data Reviewed: I have personally reviewed following labs and imaging studies  CBC: Recent Labs  Lab 01/12/2018 1544 01/20/18 0500 01/21/18 0831 01/22/18 0601 01/23/18 0642  WBC 26.5* 26.0* 24.3* 21.3* 22.2*  HGB 10.0* 9.2* 9.5* 8.7* 9.1*  HCT 29.4* 27.3* 28.3* 25.4* 26.3*  MCV 103.2* 105.0* 104.0* 102.8* 102.7*  PLT 194 168 191 176 811   Basic Metabolic Panel: Recent Labs  Lab 01/21/2018 1544 01/20/18 0500 01/21/18 0831 01/22/18 0601 01/23/18 0642  NA 128* 131* 134* 131* 130*  K 3.6 3.6 3.3* 3.4* 3.8  CL 92* 97* 98* 98* 99*  CO2 '24 25 26 24 23  '$ GLUCOSE 141* 109* 97 111* 112*  BUN 27* 32* 30* 29* 23*  CREATININE 1.88* 1.75* 1.35* 1.29* 1.12*  CALCIUM 8.2* 7.8* 8.2* 7.7* 7.6*  MG  --   --   --  1.6* 1.9  PHOS  --   --   --   --  2.5   Liver Function Tests: Recent Labs  Lab 01/22/2018 1544 01/21/18 0831  AST 40 37  ALT 17 16  ALKPHOS 69 68  BILITOT 1.5* 1.8*  PROT 6.7 6.4*  ALBUMIN 2.5* 2.2*   Coagulation Profile: Recent Labs  Lab 01/20/18 1233 01/21/18 0831 01/22/18 0601 01/22/18 1950 01/23/18 0642  INR 6.24* 6.92* 9.49* 9.70* 8.98*    CBG: Recent Labs  Lab 01/20/18 0822 01/21/18 0745  GLUCAP 91 92    Recent Results (from the past 240 hour(s))  Urine Culture     Status: Abnormal   Collection Time: 01/22/2018 10:19 PM  Result Value Ref Range Status   Specimen Description URINE, RANDOM  Final   Special Requests NONE  Final   Culture (A)  Final    <10,000 COLONIES/mL INSIGNIFICANT GROWTH Performed at [facility], [address] [place], [place] [address]    Report Status 01/21/2018 FINAL  Final  Culture, blood (x 2)     Status: None (Preliminary result)   Collection Time: 01/08/2018 10:46 PM  Result Value Ref  Range Status   Specimen Description BLOOD RIGHT ANTECUBITAL  Final   Special Requests   Final    BOTTLES DRAWN AEROBIC AND ANAEROBIC Blood Culture adequate volume   Culture   Final    NO GROWTH 2 DAYS Performed at [facility], [place] [address] [place], [place] [address]    Report Status PENDING  Incomplete  Culture, blood (Routine X 2) w Reflex to ID Panel     Status: None (Preliminary result)   Collection Time: 01/20/18 12:24 PM  Result Value Ref Range Status   Specimen Description BLOOD RIGHT HAND  Final   Special Requests   Final    BOTTLES DRAWN AEROBIC AND  ANAEROBIC Blood Culture adequate volume   Culture   Final    NO GROWTH 2 DAYS Performed at [facility], [place] [address] [place], [place] [address]    Report Status PENDING  Incomplete  Culture, blood (Routine X 2) w Reflex to ID Panel     Status: None (Preliminary result)   Collection Time: 01/20/18  8:24 PM  Result Value Ref Range Status   Specimen Description BLOOD RIGHT ARM  Final   Special Requests   Final    BOTTLES DRAWN AEROBIC ONLY Blood Culture adequate volume   Culture   Final    NO GROWTH 2 DAYS Performed at [facility], [place] [address], [place], [place] [address]    Report Status PENDING  Incomplete  MRSA PCR Screening     Status: None   Collection Time: 01/23/18  5:18 AM  Result Value Ref Range Status   MRSA by PCR NEGATIVE NEGATIVE Final    Comment:        The GeneXpert MRSA Assay (FDA approved for NASAL specimens only), is one component of a comprehensive MRSA colonization surveillance program. It is not intended to diagnose MRSA infection nor to guide or monitor treatment for MRSA infections. Performed at [facility], [place] [address], [place],  [address]          Radiology Studies: Ct Abdomen Pelvis W Contrast  Result Date: 01/22/2018 CLINICAL DATA:  Abdominal pain, lower mainly of unclear etiology. Prior CT Abd without contrast w PSBO> none clinically or  on f/u Xrays. Leukocytosis. CT with PO and IV contrast. EXAM: CT ABDOMEN AND PELVIS WITH CONTRAST TECHNIQUE: Multidetector CT imaging of the abdomen and pelvis was performed using the standard protocol following bolus administration of intravenous contrast. CONTRAST:  51m ISOVUE-300 IOPAMIDOL (ISOVUE-300) INJECTION 61% COMPARISON:  CT of the abdomen and pelvis on 01/31/2018 and 07/20/2017 FINDINGS: Lower chest: Increased RIGHT pleural effusion. New LEFT pleural effusion. There is dependent consolidation bilaterally, RIGHT greater than LEFT. Cardiomegaly. Hepatobiliary: New ascites in the abdomen and pelvis. Cirrhotic contour of the liver. Stable appearance of small low-attenuation lesion within the RIGHT hepatic lobe measuring 9 millimeters. Gallbladder is distended. Calcified gallstones are present. Pancreas: Unremarkable. No pancreatic ductal dilatation or surrounding inflammatory changes. Spleen: Normal in size without focal abnormality. Adrenals/Urinary Tract: Adrenal glands are normal. No hydronephrosis. Bladder is decompressed by a Foley catheter. Stomach/Bowel: Stomach is decompressed. There is dilatation of small bowel loops, increased compared to prior study. There is smooth tapering of small bowel loops to the level of the RIGHT central pelvis where soft tissue in low-attenuation masses are identified, described below. The colonic loops are relatively decompressed. There are scattered colonic diverticula but no acute diverticulitis. The appendix is not well seen. Changes in the RIGHT LOWER QUADRANT raise the question of appendicitis versus other process. A dense background recommend correlation with previous surgical history if available. Vascular/Lymphatic: There is dense atherosclerotic calcification of the abdominal aorta. Although involved by atherosclerosis, there is vascular opacification of the celiac axis, superior mesenteric artery, and inferior mesenteric artery. Normal appearance of the portal  venous system and inferior vena cava. Numerous collateral vessels are identified in the LEFT UPPER QUADRANT consistent with portal venous hypertension. Reproductive: Hysterectomy. Other: New ascites.  Body wall edema. Within the RIGHT LOWER QUADRANT, adjacent to the pelvic sidewall, there are low-attenuation and higher attenuation collections. Low-attenuation collections measure 2.5 x 3.7 and 2.9 x 0.2 centimeters. Higher attenuation mass is 2.0 x 3.4  centimeters. There is associated thickening of the RIGHT peritoneal reflection. Considerations include appendiceal abscess, tumor, or ovarian mass. This region also appears to be in close proximity to the point of small bowel obstruction. Musculoskeletal: Degenerative changes are seen in the mid lumbar spine. IMPRESSION: 1. RIGHT LOWER QUADRANT pelvic side wall soft tissues and masses. Considerations include complex appendiceal abscess, tumor or ovarian mass. When compared with the CT exam from October 2018, the findings favor an appendiceal abscess given the location of the appendix on the previous exam. 2. New ascites. 3. Increased RIGHT pleural effusion.  New LEFT pleural effusion. 4. Cirrhosis and portal venous hypertension. 5.  Aortic atherosclerosis.  (ICD10-I70.0) 6. Hysterectomy. These results were called by telephone at the time of interpretation on 01/22/2018 at 6:30 pm to Dr. Vernell Leep , who verbally acknowledged these results. Electronically Signed   By: Nolon Nations M.D.   On: 01/22/2018 18:43   [place] Ekg Site Rite  Result Date: 01/23/2018 If Site Rite image not attached, placement could not be confirmed due to current cardiac rhythm.       Scheduled Meds: . digoxin  0.125 mg Oral Daily  . furosemide  20 mg Intravenous BID  . insulin aspart  0-9 Units Subcutaneous Q6H  . mouth rinse  15 mL Mouth Rinse BID  . metoprolol tartrate  2.5 mg Intravenous Q8H  . rifaximin  550 mg Oral BID  . traZODone  100 mg Oral QHS   Continuous  Infusions: . magnesium sulfate 1 - 4 g bolus IVPB    . piperacillin-tazobactam (ZOSYN)  IV Stopped (01/23/18 1012)  . potassium PHOSPHATE IVPB (mmol)    . TPN ADULT (ION)       LOS: 4 days     Vernell Leep, MD, FACP, [facility]. [facility] Pager [phone]  If 7PM-7AM, please contact night-coverage [email or web address] Password [email or web address] 01/23/2018, 1:42 PM

## 2018-01-23 NOTE — Progress Notes (Signed)
ANTICOAGULATION CONSULT NOTE  Pharmacy Consult for heparin Indication: atrial fibrillation and mechanical valve  Heparin Dosing Weight: 51.7 kg  Labs: Recent Labs    01/20/18 2024  01/21/18 0831 01/22/18 0601 01/22/18 1950 01/23/18 0642  HGB  --    < > 9.5* 8.7*  --  9.1*  HCT  --   --  28.3* 25.4*  --  26.3*  PLT  --   --  191 176  --  195  LABPROT  --   --  59.4* 76.0* 77.3* 72.8*  INR  --   --  6.92* 9.49* 9.70* 8.98*  HEPARINUNFRC <0.10*  --   --   --   --   --   CREATININE  --   --  1.35* 1.29*  --  1.12*   < > = values in this interval not displayed.    Assessment: 84 yof on warfarin PTA for afib and mechanical valve. Pharmacy consulted to dose heparin. INR on admit 5.98, Hg 10, plt wnl. No bleed documented. Last dose of warfarin 4/17 per med rec. Today 9.49. No bleeding noted, Hgb stable. INR last PM 9.7, s/p vitamin K 2.5mg  early this AM, repeat INR starting to trend down to 8.98. Continue to hold heparin.   Goal of Therapy:  INR 2.5-3.5 Monitor platelets by anticoagulation protocol: Yes   Plan:  Heparin when INR<2.5 Monitor CBC, s/sx bleeding  Terina Mcelhinny A. Levada Dy, PharmD, [place] Pager: [phone]  01/23/2018 10:07 AM

## 2018-01-23 NOTE — Progress Notes (Addendum)
Discussed with Dr. Algis Liming.  CT scan reviewed by general surgery and c/w appendiceal abcess which will ultimately need surgery but plan is to get IR to drain abcess first.  In light of need for procedure in very near future, elevated INR which continues to trend upward (6.92>9.49>9.7) despite holding coumadin since admission 3 days ago and evidence of cirrhosis on CT with gastric varices and increased risk of bleeding with INR at essentially 10, will give low dose Vit K at 2.5mg  PO now.  Repeat INR first thing in the am and if not trending down will need larger dose of Vit K and likely FFP at time of procedure due to increased bleeding risk.  She has a mechanical MVR so would like to avoid large doses of Vit K unless absolutely necessary.

## 2018-01-23 NOTE — Progress Notes (Addendum)
Progress Note  Patient Name: [name] Chung Date of Encounter: 01/23/2018  Primary Cardiologist: Peter Martinique, MD   Subjective   No SOB this AM  Inpatient Medications    Scheduled Meds: . digoxin  0.125 mg Oral Daily  . furosemide  20 mg Intravenous BID  . insulin aspart  0-9 Units Subcutaneous Q6H  . mouth rinse  15 mL Mouth Rinse BID  . metoprolol tartrate  2.5 mg Intravenous Q8H  . rifaximin  550 mg Oral BID  . traZODone  100 mg Oral QHS   Continuous Infusions: . magnesium sulfate 1 - 4 g bolus IVPB    . piperacillin-tazobactam (ZOSYN)  IV Stopped (01/23/18 1012)  . potassium PHOSPHATE IVPB (mmol)    . TPN ADULT (ION)     PRN Meds: acetaminophen **OR** acetaminophen, hydrALAZINE, morphine injection, ondansetron (ZOFRAN) IV, zolpidem   Vital Signs    Vitals:   01/23/18 0035 01/23/18 0410 01/23/18 1007 01/23/18 1023  BP: (!) 115/46 (!) 116/57 (!) 132/45   Pulse: (!) 103 (!) 122 (!) 119 (!) 108  Resp: 19 20 18    Temp: 98.4 F (36.9 C) 97.7 F (36.5 C) 98.2 F (36.8 C)   TempSrc: Oral Oral Oral   SpO2: 92% 91% 94%   Weight:  134 lb (60.8 kg)    Height:        Intake/Output Summary (Last 24 hours) at 01/23/2018 1206 Last data filed at 01/23/2018 1100 Gross per 24 hour  Intake 680 ml  Output 2100 ml  Net -1420 ml   Filed Weights   01/21/18 0500 01/22/18 2351 01/23/18 0410  Weight: 140 lb 14 oz (63.9 kg) 143 lb (64.9 kg) 134 lb (60.8 kg)    Telemetry    afib rates 110 and less  ECG    na  Physical Exam  GEN: no distress Neck:No JVD. Cardiac: Irregularly irregular, 2/6 systolic murmur, valve clic Respiratory:CTAB ZY:SAYT, nontender, bowel sounds present. MS: Mild leg edema; No deformity.    Labs    Chemistry Recent Labs  Lab 01/09/2018 1544  01/21/18 0831 01/22/18 0601 01/23/18 0642  NA 128*   < > 134* 131* 130*  K 3.6   < > 3.3* 3.4* 3.8  CL 92*   < > 98* 98* 99*  CO2 24   < > 26 24 23   GLUCOSE 141*   < > 97 111* 112*  BUN 27*    < > 30* 29* 23*  CREATININE 1.88*   < > 1.35* 1.29* 1.12*  CALCIUM 8.2*   < > 8.2* 7.7* 7.6*  PROT 6.7  --  6.4*  --   --   ALBUMIN 2.5*  --  2.2*  --   --   AST 40  --  37  --   --   ALT 17  --  16  --   --   ALKPHOS 69  --  68  --   --   BILITOT 1.5*  --  1.8*  --   --   GFRNONAA 24*   < > 35* 37* 44*  GFRAA 27*   < > 41* 43* 51*  ANIONGAP 12   < > 10 9 8    < > = values in this interval not displayed.     Hematology Recent Labs  Lab 01/21/18 0831 01/22/18 0601 01/23/18 0642  WBC 24.3* 21.3* 22.2*  RBC 2.72* 2.47* 2.56*  HGB 9.5* 8.7* 9.1*  HCT 28.3* 25.4* 26.3*  MCV 104.0*  102.8* 102.7*  MCH 34.9* 35.2* 35.5*  MCHC 33.6 34.3 34.6  RDW 13.8 13.5 13.7  PLT 191 176 195    Cardiac EnzymesNo results for input(s): TROPONINI in the last 168 hours. No results for input(s): TROPIPOC in the last 168 hours.   BNP Recent Labs  Lab 01/14/2018 2240  BNP 159.9*     DDimer No results for input(s): DDIMER in the last 168 hours.   Radiology    Ct Abdomen Pelvis W Contrast  Result Date: 01/22/2018 CLINICAL DATA:  Abdominal pain, lower mainly of unclear etiology. Prior CT Abd without contrast w PSBO> none clinically or on f/u Xrays. Leukocytosis. CT with PO and IV contrast. EXAM: CT ABDOMEN AND PELVIS WITH CONTRAST TECHNIQUE: Multidetector CT imaging of the abdomen and pelvis was performed using the standard protocol following bolus administration of intravenous contrast. CONTRAST:  40mL ISOVUE-300 IOPAMIDOL (ISOVUE-300) INJECTION 61% COMPARISON:  CT of the abdomen and pelvis on 01/26/2018 and 07/20/2017 FINDINGS: Lower chest: Increased RIGHT pleural effusion. New LEFT pleural effusion. There is dependent consolidation bilaterally, RIGHT greater than LEFT. Cardiomegaly. Hepatobiliary: New ascites in the abdomen and pelvis. Cirrhotic contour of the liver. Stable appearance of small low-attenuation lesion within the RIGHT hepatic lobe measuring 9 millimeters. Gallbladder is distended.  Calcified gallstones are present. Pancreas: Unremarkable. No pancreatic ductal dilatation or surrounding inflammatory changes. Spleen: Normal in size without focal abnormality. Adrenals/Urinary Tract: Adrenal glands are normal. No hydronephrosis. Bladder is decompressed by a Foley catheter. Stomach/Bowel: Stomach is decompressed. There is dilatation of small bowel loops, increased compared to prior study. There is smooth tapering of small bowel loops to the level of the RIGHT central pelvis where soft tissue in low-attenuation masses are identified, described below. The colonic loops are relatively decompressed. There are scattered colonic diverticula but no acute diverticulitis. The appendix is not well seen. Changes in the RIGHT LOWER QUADRANT raise the question of appendicitis versus other process. A dense background recommend correlation with previous surgical history if available. Vascular/Lymphatic: There is dense atherosclerotic calcification of the abdominal aorta. Although involved by atherosclerosis, there is vascular opacification of the celiac axis, superior mesenteric artery, and inferior mesenteric artery. Normal appearance of the portal venous system and inferior vena cava. Numerous collateral vessels are identified in the LEFT UPPER QUADRANT consistent with portal venous hypertension. Reproductive: Hysterectomy. Other: New ascites.  Body wall edema. Within the RIGHT LOWER QUADRANT, adjacent to the pelvic sidewall, there are low-attenuation and higher attenuation collections. Low-attenuation collections measure 2.5 x 3.7 and 2.9 x 0.2 centimeters. Higher attenuation mass is 2.0 x 3.4 centimeters. There is associated thickening of the RIGHT peritoneal reflection. Considerations include appendiceal abscess, tumor, or ovarian mass. This region also appears to be in close proximity to the point of small bowel obstruction. Musculoskeletal: Degenerative changes are seen in the mid lumbar spine. IMPRESSION:  1. RIGHT LOWER QUADRANT pelvic side wall soft tissues and masses. Considerations include complex appendiceal abscess, tumor or ovarian mass. When compared with the CT exam from October 2018, the findings favor an appendiceal abscess given the location of the appendix on the previous exam. 2. New ascites. 3. Increased RIGHT pleural effusion.  New LEFT pleural effusion. 4. Cirrhosis and portal venous hypertension. 5.  Aortic atherosclerosis.  (ICD10-I70.0) 6. Hysterectomy. These results were called by telephone at the time of interpretation on 01/22/2018 at 6:30 pm to Dr. Vernell Leep , who verbally acknowledged these results. Electronically Signed   By: Nolon Nations M.D.   On: 01/22/2018 18:43  [place] Ekg Site Rite  Result Date: 01/23/2018 If Site Rite image not attached, placement could not be confirmed due to current cardiac rhythm.   Cardiac Studies     Patient Profile     84 y.o. female with a history of rheumatic valvular heart disease status post redo aortic valve replacement x3 and redo mitral valve surgery x2, persistent atrial fibrillation, coumadin for anticoagulation, chronic LE edema (multifactorial with diastolic CHF, venous insufficiency, cirrhosis) now admitted with SBO.  We are assisting with management of diastolic heart failure and atrial fibrillation.    Assessment & Plan    1.  Rheumatic heart disease status post aortic and mitral valve replacement with redo operations, currently has a [employer] mechanical mitral prosthesis and a [employer] freestyle aortic root conduit.   - supretherapeutic INRs this admission despite holding coumadin. Up to 9.7 yesterday, in absence of bleeding and with mechanical MV no vit K given initially   CT scan concerning for perofrated appenditis. Needs INR <2 for drain placement - received vit K 2.5 last night at midnight, 2 additional doses scheduled for today.  - once INR <2, would need hep gtt bridge given her mechanical MV - INR repeat  pending today  2. Acute on chronic diastolic HF - 62/7035 LVE 60-65%, no WMAs, cannot eval diastolic function. Normal valve function - Chest x-ray with mild pulmonary edema and right pleural effusion. - negative 1.1 L yesterday. She is on lasix 20mg  IV bid. Downtrend in Cr with diuresis consistent with CHF and venous congestion.  - continue IV diuresis, limited oral meds due to SBO and poor absorption  3. Chronic afib - on IV lopressor and oral digoxin (home regimen digoxin).  Supra therapeutic INR, coumadin on hold - heart rates <110, reasonable control given her ongoing systemic illness and expected elevated heart rates.    4. Perforated appenditis - followed by CT surgery - started on IV abx - need INR <2 for drain placement     For questions or updates, please contact [address] Please consult [email or web address] for contact info under Cardiology/STEMI.      Merrily Pew, MD  01/23/2018, 12:06 PM

## 2018-01-24 DIAGNOSIS — K3533 Acute appendicitis with perforation and localized peritonitis, with abscess: Secondary | ICD-10-CM

## 2018-01-24 DIAGNOSIS — Z7901 Long term (current) use of anticoagulants: Secondary | ICD-10-CM

## 2018-01-24 LAB — DIFFERENTIAL
BAND NEUTROPHILS: 0 %
Basophils Absolute: 0 10*3/uL (ref 0.0–0.1)
Basophils Relative: 0 %
Blasts: 0 %
EOS ABS: 0 10*3/uL (ref 0.0–0.7)
Eosinophils Relative: 0 %
LYMPHS ABS: 1.8 10*3/uL (ref 0.7–4.0)
Lymphocytes Relative: 9 %
METAMYELOCYTES PCT: 0 %
MONO ABS: 2.9 10*3/uL — AB (ref 0.1–1.0)
MONOS PCT: 14 %
MYELOCYTES: 0 %
NEUTROS ABS: 15.7 10*3/uL — AB (ref 1.7–7.7)
Neutrophils Relative %: 77 %
OTHER: 0 %
PROMYELOCYTES RELATIVE: 0 %
nRBC: 0 /100 WBC

## 2018-01-24 LAB — COMPREHENSIVE METABOLIC PANEL
ALBUMIN: 1.7 g/dL — AB (ref 3.5–5.0)
ALT: 13 U/L — ABNORMAL LOW (ref 14–54)
ANION GAP: 15 (ref 5–15)
AST: 32 U/L (ref 15–41)
Alkaline Phosphatase: 72 U/L (ref 38–126)
BUN: 18 mg/dL (ref 6–20)
CO2: 21 mmol/L — AB (ref 22–32)
Calcium: 7.3 mg/dL — ABNORMAL LOW (ref 8.9–10.3)
Chloride: 98 mmol/L — ABNORMAL LOW (ref 101–111)
Creatinine, Ser: 0.99 mg/dL (ref 0.44–1.00)
GFR calc Af Amer: 59 mL/min — ABNORMAL LOW (ref >60)
GFR calc non Af Amer: 51 mL/min — ABNORMAL LOW (ref >60)
GLUCOSE: 138 mg/dL — AB (ref 65–99)
Potassium: 2.8 mmol/L — ABNORMAL LOW (ref 3.5–5.1)
SODIUM: 134 mmol/L — AB (ref 135–145)
Total Bilirubin: 1.4 mg/dL — ABNORMAL HIGH (ref 0.3–1.2)
Total Protein: 5.1 g/dL — ABNORMAL LOW (ref 6.5–8.1)

## 2018-01-24 LAB — GLUCOSE, CAPILLARY
GLUCOSE-CAPILLARY: 164 mg/dL — AB (ref 65–99)
GLUCOSE-CAPILLARY: 166 mg/dL — AB (ref 65–99)
Glucose-Capillary: 118 mg/dL — ABNORMAL HIGH (ref 65–99)
Glucose-Capillary: 154 mg/dL — ABNORMAL HIGH (ref 65–99)
Glucose-Capillary: 151 mg/dL — ABNORMAL HIGH (ref 65–99)
Glucose-Capillary: 165 mg/dL — ABNORMAL HIGH (ref 65–99)

## 2018-01-24 LAB — PROTIME-INR
INR: 2.09
PROTHROMBIN TIME: 23.3 s — AB (ref 11.4–15.2)

## 2018-01-24 LAB — CBC
HCT: 25.8 % — ABNORMAL LOW (ref 36.0–46.0)
HEMOGLOBIN: 8.9 g/dL — AB (ref 12.0–15.0)
MCH: 35.7 pg — ABNORMAL HIGH (ref 26.0–34.0)
MCHC: 34.5 g/dL (ref 30.0–36.0)
MCV: 103.6 fL — ABNORMAL HIGH (ref 78.0–100.0)
Platelets: 212 10*3/uL (ref 150–400)
RBC: 2.49 MIL/uL — AB (ref 3.87–5.11)
RDW: 14.2 % (ref 11.5–15.5)
WBC: 20.4 10*3/uL — AB (ref 4.0–10.5)

## 2018-01-24 LAB — PHOSPHORUS: PHOSPHORUS: 3 mg/dL (ref 2.5–4.6)

## 2018-01-24 LAB — TRIGLYCERIDES: TRIGLYCERIDES: 79 mg/dL (ref <150)

## 2018-01-24 LAB — MAGNESIUM: Magnesium: 1.7 mg/dL (ref 1.7–2.4)

## 2018-01-24 LAB — HEPARIN LEVEL (UNFRACTIONATED): Heparin Unfractionated: <0.1 IU/mL — ABNORMAL LOW (ref 0.30–0.70)

## 2018-01-24 LAB — PREALBUMIN: Prealbumin: <5 mg/dL — ABNORMAL LOW (ref 18–38)

## 2018-01-24 MED ORDER — DIGOXIN 125 MCG PO TABS
0.0625 mg | ORAL_TABLET | Freq: Every day | ORAL | Status: DC
  Administered 2018-01-25 – 2018-01-31 (×7): 0.0625 mg via ORAL
  Filled 2018-01-24 (×7): qty 1

## 2018-01-24 MED ORDER — HEPARIN (PORCINE) IN NACL 100-0.45 UNIT/ML-% IJ SOLN
1800.0000 [IU]/h | INTRAMUSCULAR | Status: DC
Start: 2018-01-24 — End: 2018-01-26
  Administered 2018-01-24: 900 [IU]/h via INTRAVENOUS
  Administered 2018-01-25: 1350 [IU]/h via INTRAVENOUS
  Administered 2018-01-26: 1600 [IU]/h via INTRAVENOUS
  Filled 2018-01-24 (×3): qty 250

## 2018-01-24 MED ORDER — POTASSIUM CHLORIDE 10 MEQ/50ML IV SOLN
10.0000 meq | INTRAVENOUS | Status: AC
  Administered 2018-01-24 (×4): 10 meq via INTRAVENOUS
  Filled 2018-01-24 (×4): qty 50

## 2018-01-24 MED ORDER — MAGNESIUM SULFATE IN D5W 1-5 GM/100ML-% IV SOLN
1.0000 g | Freq: Once | INTRAVENOUS | Status: AC
  Administered 2018-01-24: 1 g via INTRAVENOUS
  Filled 2018-01-24: qty 100

## 2018-01-24 MED ORDER — TRAVASOL 10 % IV SOLN
INTRAVENOUS | Status: AC
  Administered 2018-01-24: 17:00:00 via INTRAVENOUS
  Filled 2018-01-24: qty 648

## 2018-01-24 NOTE — Care Management Important Message (Signed)
Important Message  Patient Details  Name: [name] Chung MRN: [id number] Date of Birth: Jul 17, 1933   Medicare Important Message Given:  Yes    Orbie Pyo 01/24/2018, 1:59 PM

## 2018-01-24 NOTE — Progress Notes (Signed)
ANTICOAGULATION CONSULT NOTE - Follow Up Consult  Pharmacy Consult for heparin Indication: Afib and AVR/MVR  Labs: Recent Labs    01/22/18 0601  01/23/18 0642 01/23/18 2019 01/24/18 0412  HGB 8.7*  --  9.1*  --  8.9*  HCT 25.4*  --  26.3*  --  25.8*  PLT 176  --  195  --  212  LABPROT 76.0*   < > 72.8* 28.4* 23.3*  INR 9.49*   < > 8.98* 2.69 2.09  CREATININE 1.29*  --  1.12*  --  0.99   < > = values in this interval not displayed.    Assessment: 84yo female to start heparin bridge now that INR <2.5.  Goal of Therapy:  Heparin level 0.3-0.7 units/ml Monitor platelets by anticoagulation protocol: Yes   Plan:  Will start heparin gtt at 900 units/hr and monitor heparin levels and CBC.  Wynona Neat, PharmD, BCPS  01/24/2018,7:24 AM

## 2018-01-24 NOTE — Consult Note (Signed)
Chief Complaint: Patient was seen in consultation today for intra abdominal fluid collection drain placement; possible additional aspiration Chief Complaint  Patient presents with  . Abdominal Pain  . Diarrhea  . Fever   at the request of Dr Georgiann Cocker   Supervising Physician: Daryll Brod  Patient Status: [facility] - In-pt  History of Present Illness: [name] Chung is a 84 y.o. female   Abd pain N/V Taking Cipro as OP for UTI Coumadin for Afib Leukocytosis Fever  CT 4/20: IMPRESSION: 1. RIGHT LOWER QUADRANT pelvic side wall soft tissues and masses. Considerations include complex appendiceal abscess, tumor or ovarian mass. When compared with the CT exam from October 2018, the findings favor an appendiceal abscess given the location of the appendix on the previous exam. 2. New ascites. 3. Increased RIGHT pleural effusion.  New LEFT pleural effusion. 4. Cirrhosis and portal venous hypertension. 5.  Aortic atherosclerosis.  (ICD10-I70.0) 6. Hysterectomy.  Request for abscess drain placement Dr Annamaria Boots has reviewed imaging and approves drain placement and possibly an additional aspiration  INR 2.09 today Will recheck in am Will stop Heparin 3 hrs before procedure  Past Medical History:  Diagnosis Date  . Aortic insufficiency   . Arthritis   . Chronic anticoagulation   . Chronic atrial fibrillation ([place])   . Edema   . Rheumatic heart disease   . Valvular heart disease     Past Surgical History:  Procedure Laterality Date  . ABDOMINAL HYSTERECTOMY    . AORTIC VALVE REPLACEMENT     1982, redo 1995 homograft, redo 2012 Medronic valve conduit-freestyle  . BACK SURGERY    . CATARACT EXTRACTION W/ INTRAOCULAR LENS  IMPLANT, BILATERAL    . MITRAL VALVE REPLACEMENT     1982, redo 1995 St Jude  valve    Allergies: Asa buff (mag [buffered aspirin]; Codeine; Mirtazapine; and Ultram [tramadol hcl]  Medications: Prior to Admission medications   Medication Sig Start  Date End Date Taking? Authorizing Provider  digoxin (LANOXIN) 0.125 MG tablet TAKE ONE-HALF TABLET BY  MOUTH DAILY 12/28/17  Yes [place]ique, Peter M, MD  docusate sodium (COLACE) 100 MG capsule Take 100 mg by mouth 2 (two) times daily.   Yes [provider]  ferrous sulfate 325 (65 FE) MG tablet Take 325 mg by mouth daily with breakfast.   Yes [provider]  furosemide (LASIX) 20 MG tablet TAKE 2 TABLETS BY MOUTH TWO TIMES DAILY 12/28/17  Yes [place], Peter M, MD  loperamide (IMODIUM) 2 MG capsule Take 2 mg by mouth as needed for diarrhea or loose stools.   Yes [provider]  metolazone (ZAROXOLYN) 2.5 MG tablet TAKE 1 TABLET BY MOUTH  TWICE WEEKLY 12/28/17  Yes [place], Peter M, MD  Multiple Vitamin (MULTIVITAMIN) capsule Take 1 capsule by mouth daily.   Yes [provider]  potassium chloride (K-DUR) 10 MEQ tablet Take 6 tablets ( 60 meq ) three times a day 12/28/17  Yes [place], Peter M, MD  rifaximin (XIFAXAN) 550 MG TABS tablet Take 550 mg by mouth 2 (two) times daily.   Yes [provider]  traZODone (DESYREL) 100 MG tablet Take 100 mg by mouth at bedtime.    Yes [provider]  warfarin (COUMADIN) 5 MG tablet TAKE 1 TO 2 TABLETS BY  MOUTH EVERY DAY AS DIRECTED BY COUMADIN CLINIC Patient taking differently: Take 5 mg by mouth one time only at 6 PM. 5 mg on Sunday and Thursday All other days 2.5  mg 12/28/17  Yes [place], Peter M, MD  spironolactone (ALDACTONE) 25 MG tablet Take 25 mg by mouth daily. 01/04/2018   [provider]     Family History  Problem Relation Age of Onset  . Heart attack Mother   . Stroke Mother   . Diabetes Mother   . Other Father        [place] History   Socioeconomic History  . Marital status: Married    Spouse name: Not on file  . Number of children: 0  . Years of education: Not on file  . Highest education level: Not on file  Occupational History  . 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 Financial resource strain: Not on file  . Food insecurity:    Worry: Not on file    Inability: Not on file  . Transportation needs:    Medical: Not on file    Non-medical: Not on file  Tobacco Use  . Smoking status: Never Smoker  . Smokeless tobacco: Never Used  Substance and Sexual Activity  . Alcohol use: No  . Drug use: No  . Sexual activity: Never  Lifestyle  . Physical activity:    Days per week: Not on file    Minutes per session: Not on file  . Stress: Not on file  Relationships  . Social connections:    Talks on phone: Not on file    Gets together: Not on file    Attends religious service: Not on file    Active member of club or organization: Not on file    Attends meetings of clubs or organizations: Not on file    Relationship status: Not on file  Other Topics Concern  . Not on file  Social History Narrative  . Not on file    Review of Systems: A 12 point ROS discussed and pertinent positives are indicated in the HPI above.  All other systems are negative.  Review of Systems  Constitutional: Positive for activity change, appetite change and fatigue.  Respiratory: Negative for cough.   Cardiovascular: Negative for chest pain.  Gastrointestinal: Positive for abdominal distention, abdominal pain, diarrhea and nausea.  Neurological: Positive for weakness.  Psychiatric/Behavioral: Negative for behavioral problems and confusion.    Vital Signs: BP (!) 124/52 (BP Location: Left Arm)   Pulse 94   Temp 98.1 F (36.7 C) (Oral)   Resp (!) 24   Ht 5' 5.5" (1.664 m)   Wt 128 lb (58.1 kg)   SpO2 98%   BMI 20.98 kg/m   Physical Exam  Constitutional: She is oriented to person, place, and time.  Cardiovascular: Normal rate and regular rhythm.  Pulmonary/Chest: Effort normal and breath sounds normal.  Abdominal: Bowel sounds are decreased. There is tenderness.  Neurological: She is alert and oriented to person,  place, and time.  Skin: Skin is warm and dry.  Psychiatric: She has a normal mood and affect. Her behavior is normal.  Nursing note and vitals reviewed.   Imaging: Ct Abdomen Pelvis Wo Contrast  Result Date: 01/08/2018 CLINICAL DATA:  Abdominal distention with nausea vomiting diarrhea. Weight loss EXAM: CT ABDOMEN AND PELVIS WITHOUT CONTRAST TECHNIQUE: Multidetector CT imaging of the abdomen and pelvis was performed following the standard protocol without IV contrast. COMPARISON:  CT abdomen pelvis 07/20/2017 FINDINGS: Lower chest: Small to moderate right effusion with progression. Right lower lobe atelectasis with progression. No significant  left effusion. Cardiac enlargement Hepatobiliary: Enlargement left lobe liver with irregular contour liver suggesting cirrhosis. Multiple gallstones without gallbladder wall thickening or biliary dilatation Pancreas: Negative Spleen: Negative Adrenals/Urinary Tract: Negative for renal stone or obstruction. Normal urinary bladder. No renal mass on unenhanced images. Stomach/Bowel: Small bowel is mildly dilated with air-fluid levels. Colon decompressed. Dilated varices in the left upper quadrant unchanged from the prior study likely due to portal hypertension. No bowel edema Vascular/Lymphatic: Atherosclerotic disease without aneurysm of the aorta Reproductive: Hysterectomy.  No pelvic mass Other: Negative for ascites. Musculoskeletal: No acute skeletal abnormality. IMPRESSION: 1. Findings consistent with cirrhosis and portal hypertension with varices. No ascites. 2. Cholelithiasis without cholecystitis 3. Mild small bowel dilatation with air-fluid levels which may be due to partial small bowel obstruction. Colon decompressed. Electronically Signed   By: Franchot Gallo M.D.   On: 01/28/2018 20:14   Dg Chest 2 View  Result Date: 01/20/2018 CLINICAL DATA:  Systemic inflammatory response syndrome, shortness of breath since having valve replacement surgery EXAM: CHEST -  2 VIEW COMPARISON:  01/17/2015 FINDINGS: Enlargement of cardiac silhouette with pulmonary vascular congestion post median sternotomy, AVR and MVR. Atherosclerotic calcification aorta. Prominent central pulmonary arteries. Mild interstitial edema with RIGHT pleural effusion and basilar atelectasis. Underlying emphysematous changes. No pneumothorax. Bones demineralized. IMPRESSION: Enlargement of cardiac silhouette with pulmonary vascular congestion post AVR and MVR. COPD changes with superimposed mild pulmonary edema, RIGHT pleural effusion and RIGHT basilar atelectasis. Electronically Signed   By: Lavonia Dana M.D.   On: 01/20/2018 14:19   Ct Abdomen Pelvis W Contrast  Result Date: 01/22/2018 CLINICAL DATA:  Abdominal pain, lower mainly of unclear etiology. Prior CT Abd without contrast w PSBO> none clinically or on f/u Xrays. Leukocytosis. CT with PO and IV contrast. EXAM: CT ABDOMEN AND PELVIS WITH CONTRAST TECHNIQUE: Multidetector CT imaging of the abdomen and pelvis was performed using the standard protocol following bolus administration of intravenous contrast. CONTRAST:  71mL ISOVUE-300 IOPAMIDOL (ISOVUE-300) INJECTION 61% COMPARISON:  CT of the abdomen and pelvis on 01/24/2018 and 07/20/2017 FINDINGS: Lower chest: Increased RIGHT pleural effusion. New LEFT pleural effusion. There is dependent consolidation bilaterally, RIGHT greater than LEFT. Cardiomegaly. Hepatobiliary: New ascites in the abdomen and pelvis. Cirrhotic contour of the liver. Stable appearance of small low-attenuation lesion within the RIGHT hepatic lobe measuring 9 millimeters. Gallbladder is distended. Calcified gallstones are present. Pancreas: Unremarkable. No pancreatic ductal dilatation or surrounding inflammatory changes. Spleen: Normal in size without focal abnormality. Adrenals/Urinary Tract: Adrenal glands are normal. No hydronephrosis. Bladder is decompressed by a Foley catheter. Stomach/Bowel: Stomach is decompressed. There is  dilatation of small bowel loops, increased compared to prior study. There is smooth tapering of small bowel loops to the level of the RIGHT central pelvis where soft tissue in low-attenuation masses are identified, described below. The colonic loops are relatively decompressed. There are scattered colonic diverticula but no acute diverticulitis. The appendix is not well seen. Changes in the RIGHT LOWER QUADRANT raise the question of appendicitis versus other process. A dense background recommend correlation with previous surgical history if available. Vascular/Lymphatic: There is dense atherosclerotic calcification of the abdominal aorta. Although involved by atherosclerosis, there is vascular opacification of the celiac axis, superior mesenteric artery, and inferior mesenteric artery. Normal appearance of the portal venous system and inferior vena cava. Numerous collateral vessels are identified in the LEFT UPPER QUADRANT consistent with portal venous hypertension. Reproductive: Hysterectomy. Other: New ascites.  Body wall edema. Within the RIGHT LOWER QUADRANT, adjacent to the  pelvic sidewall, there are low-attenuation and higher attenuation collections. Low-attenuation collections measure 2.5 x 3.7 and 2.9 x 0.2 centimeters. Higher attenuation mass is 2.0 x 3.4 centimeters. There is associated thickening of the RIGHT peritoneal reflection. Considerations include appendiceal abscess, tumor, or ovarian mass. This region also appears to be in close proximity to the point of small bowel obstruction. Musculoskeletal: Degenerative changes are seen in the mid lumbar spine. IMPRESSION: 1. RIGHT LOWER QUADRANT pelvic side wall soft tissues and masses. Considerations include complex appendiceal abscess, tumor or ovarian mass. When compared with the CT exam from October 2018, the findings favor an appendiceal abscess given the location of the appendix on the previous exam. 2. New ascites. 3. Increased RIGHT pleural  effusion.  New LEFT pleural effusion. 4. Cirrhosis and portal venous hypertension. 5.  Aortic atherosclerosis.  (ICD10-I70.0) 6. Hysterectomy. These results were called by telephone at the time of interpretation on 01/22/2018 at 6:30 pm to Dr. Vernell Leep , who verbally acknowledged these results. Electronically Signed   By: Nolon Nations M.D.   On: 01/22/2018 18:43   Dg Chest Port 1 View  Result Date: 01/23/2018 CLINICAL DATA:  Status post PICC placement today. EXAM: PORTABLE CHEST 1 VIEW COMPARISON:  PA and lateral chest 01/20/2018. FINDINGS: New right PICC is in place with the tip projecting in the lower superior vena cava. Right pleural effusion and basilar airspace disease have worsened. Left lung is clear. No pneumothorax. There is cardiomegaly. IMPRESSION: Tip of right PICC projects in the lower superior vena cava. Small to moderate right pleural effusion and basilar airspace disease have increased since the most recent exam. Electronically Signed   By: Inge Rise M.D.   On: 01/23/2018 14:30   Dg Abd Portable 1v  Result Date: 01/21/2018 CLINICAL DATA:  Small-bowel obstruction EXAM: PORTABLE ABDOMEN - 1 VIEW COMPARISON:  01/20/2018 FINDINGS: Normal bowel gas pattern.  No dilated bowel loops. Right pleural effusion and right lower lobe airspace disease IMPRESSION: Normal bowel gas pattern. Electronically Signed   By: Franchot Gallo M.D.   On: 01/21/2018 10:45   Dg Abd Portable 1v  Result Date: 01/20/2018 CLINICAL DATA:  Abdominal pain since 01/13/2018. EXAM: PORTABLE ABDOMEN - 1 VIEW COMPARISON:  CT abdomen and pelvis today. FINDINGS: Mild dilatation of small bowel seen on CT today is difficult to appreciate on this examination. Gas is seen throughout almost the entire colon. No unexpected abdominal calcification or focal bony abnormality. IMPRESSION: Mild small bowel dilatation seen on CT scan scanned today is difficult to appreciate on plain film. The bowel gas pattern is benign.  Electronically Signed   By: Inge Rise M.D.   On: 01/20/2018 11:21   [place] Ekg Site Rite  Result Date: 01/23/2018 If Site Rite image not attached, placement could not be confirmed due to current cardiac rhythm.   Labs:  CBC: Recent Labs    01/21/18 0831 01/22/18 0601 01/23/18 0642 01/24/18 0412  WBC 24.3* 21.3* 22.2* 20.4*  HGB 9.5* 8.7* 9.1* 8.9*  HCT 28.3* 25.4* 26.3* 25.8*  PLT 191 176 195 212    COAGS: Recent Labs    01/16/2018 2240  01/22/18 1950 01/23/18 0642 01/23/18 2019 01/24/18 0412  INR 5.98*   < > 9.70* 8.98* 2.69 2.09  APTT 98*  --   --   --   --   --    < > = values in this interval not displayed.    BMP: Recent Labs    01/21/18 0831 01/22/18 0601 01/23/18  5597 01/24/18 0412  NA 134* 131* 130* 134*  K 3.3* 3.4* 3.8 2.8*  CL 98* 98* 99* 98*  CO2 26 24 23  21*  GLUCOSE 97 111* 112* 138*  BUN 30* 29* 23* 18  CALCIUM 8.2* 7.7* 7.6* 7.3*  CREATININE 1.35* 1.29* 1.12* 0.99  GFRNONAA 35* 37* 44* 51*  GFRAA 41* 43* 51* 59*    LIVER FUNCTION TESTS: Recent Labs    12/27/17 1423 01/30/2018 1544 01/21/18 0831 01/24/18 0412  BILITOT 0.9 1.5* 1.8* 1.4*  AST 49* 40 37 32  ALT 18 17 16  13*  ALKPHOS 78 69 68 72  PROT 7.3 6.7 6.4* 5.1*  ALBUMIN 3.2* 2.5* 2.2* 1.7*    TUMOR MARKERS: No results for input(s): AFPTM, CEA, CA199, CHROMGRNA in the last 8760 hours.  Assessment and Plan:  Ruptured appendix Intra abdominal abscess Scheduled for abscess drain with possible additional aspiration Risks and benefits discussed with the patient including bleeding, infection, damage to adjacent structures, bowel perforation/fistula connection, and sepsis.  All of the patient's questions were answered, patient is agreeable to proceed. Consent signed and in chart.   Thank you for this interesting consult.  I greatly enjoyed meeting CLARECE DRZEWIECKI and look forward to participating in their care.  A copy of this report was sent to the requesting provider on  this date.  Electronically Signed: Lavonia Drafts, PA-C 01/24/2018, 2:31 PM   I spent a total of 40 Minutes    in face to face in clinical consultation, greater than 50% of which was counseling/coordinating care for intra abdominal abscess drain placement; possible additional aspiration

## 2018-01-24 NOTE — Progress Notes (Signed)
PROGRESS NOTE   SHERRIKA WEAKLAND  TDV:[id number]    DOB: 10/15/1932    DOA: 01/11/2018  PCP: Lajean Manes, MD   I have briefly reviewed patients previous medical records [facility].  Brief Narrative:  84 year old married female, lives at [employer] in an apartment with her spouse who has dementia, independent, PMH of chronic A. fib, mechanical AVR & MVR on Coumadin, chronic diastolic CHF (Dr. Martinique, Cardiology), stage III chronic kidney disease, chronic lower extremity edema, hysterectomy presented to ED 01/12/2018 with 3 days history of nausea, nonbloody emesis, abdominal distention and pain for which she was seen by PCP 2 days PTA and started on Cipro for presumed UTI but UA returned negative so Cipro was stopped and also reported diarrhea.  PCP started Imodium with improvement in diarrhea.  In ED, initial noncontrasted CT abdomen and pelvis suggestive of PSBO.  General surgery was consulted and patient did not improve with conservative management for SBO.  After renal insufficiency improved, obtained CT abdomen and pelvis with contrast on 4/20 which is suggestive of perforated appendix with abscess.  Reversed INR with vitamin K.  IR consulted for CT-guided percutaneous drainage of abscess. Cardiology assisting for decompensated CHF, A. fib with RVR.  Assessment & Plan:   Principal Problem:   Partial small bowel obstruction (HCC) Active Problems:   Chronic atrial fibrillation (HCC)   Long term current use of anticoagulant therapy   S/P MVR (mitral valve replacement)   S/P AVR (aortic valve replacement)   Acute renal failure superimposed on stage 3 chronic kidney disease (HCC)   Atrial fibrillation with RVR (HCC)   Chronic systolic (congestive) heart failure (HCC)   Hyponatremia   Sepsis ([facility])   Liver cirrhosis (HCC)   Perforated appendicular abscess and small bowel ileus: Patient had noncontrasted CT abdomen and pelvis on admission which suggested partial small bowel obstruction.   General surgery was consulted.  She was treated conservatively for PSBO but did not improve.  After renal insufficiency had improved, she underwent CT abdomen and pelvis with contrast on 4/20 which confirmed suspected perforated appendix with abscess.  Case was extensively discussed with patient, her step daughter, surgical and cardiology teams.  Patient wishes to proceed with appropriate interventions as needed including drain or surgery.  General surgery recommends percutaneous drain of abscess by IR but needs INR to be less than 2.  Initially reluctant to reverse INR due to mechanical valve.  Now due to need for surgical intervention, reversed INR with 2 doses of vitamin K (2.5 mg PO x1 followed by 2.5 mg IV x1 on 4/21).  INR down to 2.09.  IV Zosyn continued.  Since patient has had poor oral intake for several days which may continue for several more, PICC line placed and TPN started for nutrition.  Mobilize. NPO except ice chips and meds with sips.  CEA: 5.6 and CA 125: 184.3 (DD: Tumor versus ovarian carcinoma).  When drain placed, hopefully fluid can be sent for cytology.  Acute urinary retention: She had recurrent acute urinary retention despite in and out Foley catheter x3 and hence Foley catheter was left in place.  Likely precipitated by acute pain and opioids.  Continue Foley for now.  Sepsis secondary to perforated appendix: Patient met sepsis criteria on admission.  Lactate normal. PCT 24.83.  Chest x-ray without pneumonia.  Now we know that this was from suspected Appendicular abscess.  Continue IV Zosyn.  Blood cultures x2: Negative to date.  Urine culture shows insignificant  growth.  Chronic A. fib with RVR: CHA2DS2-VASc Score is 4.  Presented with heart rate in the 110s-130s, likely related to PSBO associated symptoms.  Digoxin level low on admission.  Patient sees Dr.  Martinique, cardiology.  Cardiology follow-up appreciated.  Continue IV metoprolol 2.5 mg every 8 hourly and oral digoxin.  Rate  better controlled now.  Supratherapeutic INR: Progressively increased to 9.49 on 4/19.  Likely related to poor oral intake in the context of GI symptoms, acute illness and underlying cirrhosis.  Last dose of Coumadin was 01/16/2018.  Initially did not reverse INR due to no bleeding and presence of mechanical valve.  However now with need for surgical intervention, after discussing with cardiology, general surgery and I also discussed with hematologist on call 01/23/18 reversed INR with 2 doses of vitamin K as indicated above.  INR down to 2.09.  Discussed with pharmacy 4/22 and initiated IV heparin drip.  Follow INR closely.  As per hematology, does not recommend FFP unless bleeding.   Status post mechanical MVR & AVR: Management as above.  Acute on chronic diastolic CHF: TTE 69/45/03 shows LVEF 60-65%.  Patient reports chronic lower extremity edema.  Diuretics including Lasix, metolazone, HCTZ, spironolactone temporarily held due to GI losses, acute kidney injury and SIRS nature.  Cardiology input appreciated.  Has been started on IV Lasix 20 mg twice daily.  Diuresing and -2.28 L since admission.  Improved.  Acute on stage III chronic kidney disease: Baseline creatinine 1-1.3.  Patient presented with creatinine of 1.88.  Likely prerenal due to dehydration from GI losses and diuretics.  Creatinine has normalized.  Dehydration with hyponatremia: Secondary to GI losses and diuretics.  Management as above.  Sodium is improved from 128-131.  TSH normal.  Hyponatremia stable.  Dehydration resolved.  Now on TPN.  Liver cirrhosis and portal hypertension: Confirmed on CT abdomen 4/20.  Continue rifaximin.  Ammonia level normal.  Microcytic anemia: Unclear etiology.  Hemoglobin gradually drifting down/8.7 in the absence of overt bleeding.  Hemoglobin stable.  Leukocytosis: Secondary to infectious etiology as above.  Continue Zosyn.  Leukocytosis slowly improving.  Weight loss: Reports 16 pound weight loss  since November 2018.  May be multifactorial related to aggressive diuresis for leg edema/CHF, poor oral intake related to intermittent nausea and vomiting from potassium supplements.  Unable to remember if she had colonoscopy but has been advised that she is not a good candidate by her PCP/GI (Dr. Paulita Fujita).  Outpatient follow-up.?  Could the right lower quadrant issue be malignancy.  Please see discussion above and may need further evaluation.  Very poor surgical candidate.  Hypokalemia/hypomagnesemia: Management per pharmacy via TPN.   DVT prophylaxis: IV heparin initiated by pharmacy 4/22. Code Status: Full. Family Communication: I discussed in detail with patient's stepdaughter at bedside and caregiver.  Updated care and answered questions. Disposition: To be determined.  Not medically stable for discharge.   Consultants:  General surgery Cardiology IR: General surgery have consulted IR for percutaneous drain.  Procedures:  In and out catheterization as needed for urinary retention. TED hose. PICC line  Antimicrobials:  IV Zosyn   Subjective: Patient reports no change in abdominal pain, mostly lower quadrants, 7/10 in severity.  Minimal flatus.  No BM since 01/18/18.  Anxious for some food.  No chest pain or dyspnea.  As per RN, no acute issues noted.  ROS: As above  Objective:  Vitals:   01/23/18 2047 01/24/18 0036 01/24/18 0413 01/24/18 0807  BP: (!) 120/49 Marland Kitchen)  119/46 (!) 109/52 (!) 113/43  Pulse: 98 (!) 104 (!) 109 90  Resp: '16 14 14 19  '$ Temp: 98.6 F (37 C) 97.9 F (36.6 C) 98 F (36.7 C) 97.9 F (36.6 C)  TempSrc: Oral Tympanic Oral Oral  SpO2: 97% 92% 92% 92%  Weight:   58.1 kg (128 lb)   Height:        Examination:  General exam: Pleasant elderly female, moderately built and nourished lying comfortably propped up in bed.  Oral mucosa moist. Respiratory system: Clear to auscultation.  No increased work of breathing.  Stable. Cardiovascular system: S1 and S2  heard, irregularly irregular.  No JVD, murmurs.  Crisp click of mechanical mitral valve heard.  Telemetry personally reviewed and shows A. fib with controlled ventricular rate in the 90s. Gastrointestinal system: Abdomen is mildly distended, diffuse tenderness, especially lower quadrants with voluntary guarding but no rigidity or rebound.  Scant bowel sounds heard.  No organomegaly or masses appreciated. Central nervous system: Alert and oriented. No focal neurological deficits.  Stable. Extremities: Symmetric 5 x 5 power. Skin: No rashes, lesions or ulcers Psychiatry: Judgement and insight appear impaired. Mood & affect appropriate.     Data Reviewed: I have personally reviewed following labs and imaging studies  CBC: Recent Labs  Lab 01/20/18 0500 01/21/18 0831 01/22/18 0601 01/23/18 0642 01/24/18 0412  WBC 26.0* 24.3* 21.3* 22.2* 20.4*  NEUTROABS  --   --   --   --  15.7*  HGB 9.2* 9.5* 8.7* 9.1* 8.9*  HCT 27.3* 28.3* 25.4* 26.3* 25.8*  MCV 105.0* 104.0* 102.8* 102.7* 103.6*  PLT 168 191 176 195 161   Basic Metabolic Panel: Recent Labs  Lab 01/20/18 0500 01/21/18 0831 01/22/18 0601 01/23/18 0642 01/24/18 0412  NA 131* 134* 131* 130* 134*  K 3.6 3.3* 3.4* 3.8 2.8*  CL 97* 98* 98* 99* 98*  CO2 '25 26 24 23 '$ 21*  GLUCOSE 109* 97 111* 112* 138*  BUN 32* 30* 29* 23* 18  CREATININE 1.75* 1.35* 1.29* 1.12* 0.99  CALCIUM 7.8* 8.2* 7.7* 7.6* 7.3*  MG  --   --  1.6* 1.9 1.7  PHOS  --   --   --  2.5 3.0   Liver Function Tests: Recent Labs  Lab 01/14/2018 1544 01/21/18 0831 01/24/18 0412  AST 40 37 32  ALT 17 16 13*  ALKPHOS 69 68 72  BILITOT 1.5* 1.8* 1.4*  PROT 6.7 6.4* 5.1*  ALBUMIN 2.5* 2.2* 1.7*   Coagulation Profile: Recent Labs  Lab 01/22/18 0601 01/22/18 1950 01/23/18 0642 01/23/18 2019 01/24/18 0412  INR 9.49* 9.70* 8.98* 2.69 2.09    CBG: Recent Labs  Lab 01/21/18 0745 01/22/18 0809 01/23/18 1808 01/24/18 0041 01/24/18 0636  GLUCAP 92 118*  135* 166* 154*    Recent Results (from the past 240 hour(s))  Urine Culture     Status: Abnormal   Collection Time: 01/12/2018 10:19 PM  Result Value Ref Range Status   Specimen Description URINE, RANDOM  Final   Special Requests NONE  Final   Culture (A)  Final    <10,000 COLONIES/mL INSIGNIFICANT GROWTH Performed at [facility], [address] [place], [place] [address]    Report Status 01/21/2018 FINAL  Final  Culture, blood (x 2)     Status: None (Preliminary result)   Collection Time: 01/13/2018 10:46 PM  Result Value Ref Range Status   Specimen Description BLOOD RIGHT ANTECUBITAL  Final   Special Requests  Final    BOTTLES DRAWN AEROBIC AND ANAEROBIC Blood Culture adequate volume   Culture   Final    NO GROWTH 3 DAYS Performed at [facility], [place] [address], [place], [place] [address]    Report Status PENDING  Incomplete  Culture, blood (Routine X 2) w Reflex to ID Panel     Status: None (Preliminary result)   Collection Time: 01/20/18 12:24 PM  Result Value Ref Range Status   Specimen Description BLOOD RIGHT HAND  Final   Special Requests   Final    BOTTLES DRAWN AEROBIC AND ANAEROBIC Blood Culture adequate volume   Culture   Final    NO GROWTH 3 DAYS Performed at [facility], [place] [address], [place], [place] [address]    Report Status PENDING  Incomplete  Culture, blood (Routine X 2) w Reflex to ID Panel     Status: None (Preliminary result)   Collection Time: 01/20/18  8:24 PM  Result Value Ref Range Status   Specimen Description BLOOD RIGHT ARM  Final   Special Requests   Final    BOTTLES DRAWN AEROBIC ONLY Blood Culture adequate volume   Culture   Final    NO GROWTH 3 DAYS Performed at [facility], [address] [place], [place] [address]    Report Status PENDING  Incomplete  MRSA PCR Screening     Status: None   Collection Time: 01/23/18  5:18 AM  Result Value Ref Range Status   MRSA by PCR NEGATIVE NEGATIVE Final    Comment:         The GeneXpert MRSA Assay (FDA approved for NASAL specimens only), is one component of a comprehensive MRSA colonization surveillance program. It is not intended to diagnose MRSA infection nor to guide or monitor treatment for MRSA infections. Performed at [facility], [place] [address] [place], [place] [address]          Radiology Studies: Ct Abdomen Pelvis W Contrast  Result Date: 01/22/2018 CLINICAL DATA:  Abdominal pain, lower mainly of unclear etiology. Prior CT Abd without contrast w PSBO> none clinically or on f/u Xrays. Leukocytosis. CT with PO and IV contrast. EXAM: CT ABDOMEN AND PELVIS WITH CONTRAST TECHNIQUE: Multidetector CT imaging of the abdomen and pelvis was performed using the standard protocol following bolus administration of intravenous contrast. CONTRAST:  56m ISOVUE-300 IOPAMIDOL (ISOVUE-300) INJECTION 61% COMPARISON:  CT of the abdomen and pelvis on 01/07/2018 and 07/20/2017 FINDINGS: Lower chest: Increased RIGHT pleural effusion. New LEFT pleural effusion. There is dependent consolidation bilaterally, RIGHT greater than LEFT. Cardiomegaly. Hepatobiliary: New ascites in the abdomen and pelvis. Cirrhotic contour of the liver. Stable appearance of small low-attenuation lesion within the RIGHT hepatic lobe measuring 9 millimeters. Gallbladder is distended. Calcified gallstones are present. Pancreas: Unremarkable. No pancreatic ductal dilatation or surrounding inflammatory changes. Spleen: Normal in size without focal abnormality. Adrenals/Urinary Tract: Adrenal glands are normal. No hydronephrosis. Bladder is decompressed by a Foley catheter. Stomach/Bowel: Stomach is decompressed. There is dilatation of small bowel loops, increased compared to prior study. There is smooth tapering of small bowel loops to the level of the RIGHT central pelvis where soft tissue in low-attenuation masses are identified, described below. The colonic loops are relatively decompressed.  There are scattered colonic diverticula but no acute diverticulitis. The appendix is not well seen. Changes in the RIGHT LOWER QUADRANT raise the question of appendicitis versus other process. A dense background recommend correlation with previous surgical  history if available. Vascular/Lymphatic: There is dense atherosclerotic calcification of the abdominal aorta. Although involved by atherosclerosis, there is vascular opacification of the celiac axis, superior mesenteric artery, and inferior mesenteric artery. Normal appearance of the portal venous system and inferior vena cava. Numerous collateral vessels are identified in the LEFT UPPER QUADRANT consistent with portal venous hypertension. Reproductive: Hysterectomy. Other: New ascites.  Body wall edema. Within the RIGHT LOWER QUADRANT, adjacent to the pelvic sidewall, there are low-attenuation and higher attenuation collections. Low-attenuation collections measure 2.5 x 3.7 and 2.9 x 0.2 centimeters. Higher attenuation mass is 2.0 x 3.4 centimeters. There is associated thickening of the RIGHT peritoneal reflection. Considerations include appendiceal abscess, tumor, or ovarian mass. This region also appears to be in close proximity to the point of small bowel obstruction. Musculoskeletal: Degenerative changes are seen in the mid lumbar spine. IMPRESSION: 1. RIGHT LOWER QUADRANT pelvic side wall soft tissues and masses. Considerations include complex appendiceal abscess, tumor or ovarian mass. When compared with the CT exam from October 2018, the findings favor an appendiceal abscess given the location of the appendix on the previous exam. 2. New ascites. 3. Increased RIGHT pleural effusion.  New LEFT pleural effusion. 4. Cirrhosis and portal venous hypertension. 5.  Aortic atherosclerosis.  (ICD10-I70.0) 6. Hysterectomy. These results were called by telephone at the time of interpretation on 01/22/2018 at 6:30 pm to Dr. Vernell Leep , who verbally acknowledged  these results. Electronically Signed   By: Nolon Nations M.D.   On: 01/22/2018 18:43   Dg Chest Port 1 View  Result Date: 01/23/2018 CLINICAL DATA:  Status post PICC placement today. EXAM: PORTABLE CHEST 1 VIEW COMPARISON:  PA and lateral chest 01/20/2018. FINDINGS: New right PICC is in place with the tip projecting in the lower superior vena cava. Right pleural effusion and basilar airspace disease have worsened. Left lung is clear. No pneumothorax. There is cardiomegaly. IMPRESSION: Tip of right PICC projects in the lower superior vena cava. Small to moderate right pleural effusion and basilar airspace disease have increased since the most recent exam. Electronically Signed   By: Inge Rise M.D.   On: 01/23/2018 14:30   [place] Ekg Site Rite  Result Date: 01/23/2018 If Site Rite image not attached, placement could not be confirmed due to current cardiac rhythm.       Scheduled Meds: . [START ON 01/25/2018] digoxin  0.0625 mg Oral Daily  . furosemide  20 mg Intravenous BID  . insulin aspart  0-9 Units Subcutaneous Q6H  . mouth rinse  15 mL Mouth Rinse BID  . metoprolol tartrate  2.5 mg Intravenous Q8H  . rifaximin  550 mg Oral BID  . sodium chloride flush  10-40 mL Intracatheter Q12H  . traZODone  100 mg Oral QHS   Continuous Infusions: . heparin 900 Units/hr (01/24/18 0830)  . magnesium sulfate 1 - 4 g bolus IVPB    . piperacillin-tazobactam (ZOSYN)  IV Stopped (01/24/18 0925)  . potassium chloride Stopped (01/24/18 1059)  . TPN ADULT (ION) 30 mL/hr at 01/23/18 1728  . TPN ADULT (ION)       LOS: 5 days     Vernell Leep, MD, FACP, [facility]. [facility] Pager [phone]  If 7PM-7AM, please contact night-coverage [email or web address] Password [facility] 01/24/2018, 11:23 AM

## 2018-01-24 NOTE — Progress Notes (Signed)
Initial Nutrition Assessment  DOCUMENTATION CODES:   Not applicable  INTERVENTION:   TPN Per Pharmacy  Replete K+ (2.8)  Monitor K+, Mg, and Phos for at least 3 days, replete as necessary, patient is at risk for refeeding.  Recommend 100mg  thiamine daily for risk of refeeding.  NUTRITION DIAGNOSIS:   Inadequate oral intake related to inability to eat, acute illness, nausea, vomiting as evidenced by NPO status, energy intake < or equal to 50% for > or equal to 5 days.  GOAL:   Patient will meet greater than or equal to 90% of their needs  MONITOR:   I & O's, Labs, Skin, Weight trends  REASON FOR ASSESSMENT:   Consult Assessment of nutrition requirement/status  ASSESSMENT:   [name] Chung is an 84 yo female with PMH dementia, chronic A-fib, mechanical AVR & MVR replacement on coumadin, chronic diastolic CHF, Stg III CKD presented to ED 01/05/2018 with 3 days of nausea, nonbloody emesis, abdominal distension, and pain. She was seen by PCP started on cipro for presumed UTI. UA returned negative, so Cipro was stopped, patient also reported diarrhea. CT abdomen 4/20 suggested perforated appendicular abscess and ileus.  Surgery ordered PICC Line and TPN. Currently, vitamin K is being used to reverse INR so IR can place percutaneous drain of abscess.  Spoke with [name] Chung at bedside. She reports normal oral intake of bacon, eggs, coffee and fruit for breakfast, normally doesn't eat lunch, and will have people bring her different foods for dinner. She comes from [place] but doesn't like the food there, so she will have people usually bring her cooked meals for dinner.   She reports being 140 pounds a year ago and dropping down to 114 pounds, but RD is unable to corroborate this via chart, appears she stated her weight as 114 pounds upon admission.   It appears she was 137 pounds 02/16/2017, and was 141 pounds when she was weighed on 01/20/2018, has dropped from there to 128 pounds today, a  13 pound/9.2% severe weight loss for timeframe likely related to diuresis as she is 2.6L fluid negative since admission.  Patient is in significant abdominal pain, this RD did not move her around to assess her scapula and ribs for this reason.  Unsure if her muscle wasting and fat depletions are related to aging rather than malnutrition, she seems to have been eating ok with no weight loss prior to onset of current illness.  Labs reviewed:  Na 134, K+ 2.8 Medications reviewed and include:  Insulin Heparin gtt   NUTRITION - FOCUSED PHYSICAL EXAM:    Most Recent Value  Orbital Region  Mild depletion  Upper Arm Region  Severe depletion  Thoracic and Lumbar Region  Unable to assess  Buccal Region  Moderate depletion  Temple Region  Severe depletion  Clavicle Bone Region  Moderate depletion  Clavicle and Acromion Bone Region  Moderate depletion  Scapular Bone Region  Unable to assess  Dorsal Hand  Moderate depletion  Patellar Region  Moderate depletion  Anterior Thigh Region  Moderate depletion  Posterior Calf Region  Moderate depletion  Edema (RD Assessment)  Moderate       Diet Order:  Diet NPO time specified Except for: Sips with Meds, Ice Chips TPN ADULT (ION) TPN ADULT (ION) Diet NPO time specified Except for: Sips with Meds  EDUCATION NEEDS:   Not appropriate for education at this time  Skin:  Skin Assessment: Reviewed RN Assessment(abrasion to L foot)  Last BM:  01/21/2018  Height:   Ht Readings from Last 1 Encounters:  01/20/18 5' 5.5" (1.664 m)    Weight:   Wt Readings from Last 1 Encounters:  01/24/18 128 lb (58.1 kg)    Ideal Body Weight:  58 kg  BMI:  Body mass index is 20.98 kg/m.  Estimated Nutritional Needs:   Kcal:  1595-3967 calories (MSJ x1.3-1.5)  Protein:  81-93 grams (1.4-1.6g/kg)  Fluid:  1.5-1.6L  Satira Anis. Irene Mitcham, MS, RD LDN Inpatient Clinical Dietitian Pager [phone]

## 2018-01-24 NOTE — Progress Notes (Signed)
PHARMACY - ADULT TOTAL PARENTERAL NUTRITION CONSULT NOTE   Pharmacy Consult:  TPN Indication:  Prolonged ileus / Prolonged NPO status  Patient Measurements: Height: 5' 5.5" (166.4 cm) Weight: 128 lb (58.1 kg) IBW/kg (Calculated) : 58.15 TPN AdjBW (KG): 64 Body mass index is 20.98 kg/m.  Assessment:  69 YOF presented on 01/20/2018 with N/V/D and abdominal pain, found to have pSBO.  She was placed on a clear liquid diet on 01/21/18, which her caregiver reported that she didn't eat much.  01/22/18 CT concerning for perforated appendicitis and patient may need surgery; therefore, she was made NPO again.  Today is day #5 of essentially NPO status and MD suspects patient will not be able to eat adequately within the next week.  Pharmacy consulted to initiate TPN.   GI: cirrhosis with portal venous HTN.  LBM 4/17 - rifaximin, PRN Zofran, tolerated first day of TPN. Prealbumin very low Endo: no hx DM, cbgs 100-160 Insulin requirements in the past 24 hours: 5 units ssi Lytes: slightly low Na/CL prior to TPN, K 2.8 (goal >/= 4 for ileus), Mag 1.7 (goal >/= 2 for ileus) Renal: SCr 0.99, UOP 1.3 ml/kg/hr Pulm: 1.5L [facility] Cards: Rheumatic heart dz - BP controlled, tachy (in Afib) - digoxin, Lasix IV, IV metoprolol AC: Coumadin PTA for Afib / mech valves >> INR 8.98, Vitamin K and to transition to IV Heparin - hgb up to 9.1, plts WNL Hepatobil: LFTs WNL, tbili 1.4 Neuro: trazodone, PRN morphine - pain score 7 ID: Zosyn for appendiceal abscess/enteritis - afebrile, WBC 20 TPN Access: PICC placed 01/23/18 TPN start date: 01/23/18  Nutritional Goals (RD rec pending): 1500-1650 kCal and 75-90 gm protein per day  Current Nutrition:  NPO  Plan:  -Increase TPN to 50 ml/hr (goal ~65 ml/hr) -TPN will provide 65 g AA, 192 g CHO and 29 g ILE, which equals to 1200 kCal, meeting ~75% of patients needs -Electrolytes in TPN: increase Na/Mag/K+, change Cl:Ac to 1:1,  -Give KCl 10 mEq x4 runs -Give Magnesium 1 g IV  x1 -Daily multivitamin and trace elements in TPN -Sensitive SSI Q6H -F/u RD goals    Harvel Quale 01/24/2018 8:46 AM

## 2018-01-24 NOTE — Progress Notes (Signed)
ANTICOAGULATION CONSULT NOTE - Follow Up Consult  Pharmacy Consult for Heparin (Coumadin on hold) Indication: atrial fibrillation and mechanical AVR & MVR  Allergies  Allergen Reactions  . Asa Buff (Mag [Buffered Aspirin] Other (See Comments)    On coumadin  . Codeine   . Mirtazapine Nausea Only  . Ultram [Tramadol Hcl]     Patient Measurements: Height: 5' 5.5" (166.4 cm) Weight: 128 lb (58.1 kg) IBW/kg (Calculated) : 58.15 Heparin Dosing Weight: 58 kg  Vital Signs: Temp: 98.1 F (36.7 C) (04/22 1300) Temp Source: Oral (04/22 1300) BP: 124/52 (04/22 1300) Pulse Rate: 94 (04/22 1300)  Labs: Recent Labs    01/22/18 0601  01/23/18 1610 01/23/18 2019 01/24/18 0412 01/24/18 1651  HGB 8.7*  --  9.1*  --  8.9*  --   HCT 25.4*  --  26.3*  --  25.8*  --   PLT 176  --  195  --  212  --   LABPROT 76.0*   < > 72.8* 28.4* 23.3*  --   INR 9.49*   < > 8.98* 2.69 2.09  --   HEPARINUNFRC  --   --   --   --   --  <0.10*  CREATININE 1.29*  --  1.12*  --  0.99  --    < > = values in this interval not displayed.    Estimated Creatinine Clearance: 39.5 mL/min (by C-G formula based on SCr of 0.99 mg/dL).  Assessment:   84 yr old female on Coumadin prior to admission for mechanical AVR & MVR and atrial fibrillation.   Coumadin on hold for abdominal drain when INR down.  INR 2.09 today.  Vitamin K given 4/21.     Heparin drip begun this am at 900 units/hr. Initial heparin level is undetectable (<0.1).  No known infusion problems.  Goal of Therapy:  Heparin level 0.3-0.7 units/ml Monitor platelets by anticoagulation protocol: Yes   Plan:   Increase heparin drip to 1100 units/hr  Next heparin level and CBC in am.  Daily PT/INR.  Noted plan to hold IV heparin 3 hrs prior to drain placement.  Will follow up for heparin plans pre- and post-procedure.  Arty Baumgartner, [place] Pager: [phone] 01/24/2018,7:50 PM

## 2018-01-24 NOTE — Progress Notes (Signed)
Progress Note  Patient Name: [name] Chung Date of Encounter: 01/24/2018  Primary Cardiologist: Peter Martinique, MD  Subjective   Laying bed, very weak, feels terrible but in good spirits.   Inpatient Medications    Scheduled Meds: . [START ON 01/25/2018] digoxin  0.0625 mg Oral Daily  . furosemide  20 mg Intravenous BID  . insulin aspart  0-9 Units Subcutaneous Q6H  . mouth rinse  15 mL Mouth Rinse BID  . metoprolol tartrate  2.5 mg Intravenous Q8H  . rifaximin  550 mg Oral BID  . sodium chloride flush  10-40 mL Intracatheter Q12H  . traZODone  100 mg Oral QHS   Continuous Infusions: . heparin 900 Units/hr (01/24/18 0830)  . magnesium sulfate 1 - 4 g bolus IVPB    . piperacillin-tazobactam (ZOSYN)  IV 3.375 g (01/24/18 0525)  . potassium chloride    . TPN ADULT (ION) 30 mL/hr at 01/23/18 1728  . TPN ADULT (ION)     PRN Meds: acetaminophen **OR** acetaminophen, hydrALAZINE, morphine injection, ondansetron (ZOFRAN) IV, sodium chloride flush, zolpidem   Vital Signs    Vitals:   01/23/18 2047 01/24/18 0036 01/24/18 0413 01/24/18 0807  BP: (!) 120/49 (!) 119/46 (!) 109/52 (!) 113/43  Pulse: 98 (!) 104 (!) 109 90  Resp: 16 14 14 19   Temp: 98.6 F (37 C) 97.9 F (36.6 C) 98 F (36.7 C) 97.9 F (36.6 C)  TempSrc: Oral Tympanic Oral Oral  SpO2: 97% 92% 92% 92%  Weight:   128 lb (58.1 kg)   Height:        Intake/Output Summary (Last 24 hours) at 01/24/2018 1035 Last data filed at 01/24/2018 1010 Gross per 24 hour  Intake 503.33 ml  Output 2525 ml  Net -2021.67 ml   Filed Weights   01/22/18 2351 01/23/18 0410 01/24/18 0413  Weight: 143 lb (64.9 kg) 134 lb (60.8 kg) 128 lb (58.1 kg)    Telemetry    AFib Rate 80-100 - Personally Reviewed  ECG    N/a  - Personally Reviewed  Physical Exam   General: Thin frail older W female appearing in no acute distress. Head: Normocephalic, atraumatic.  Neck: Supple, no JVD. Lungs:  Resp regular and unlabored, Clear  anteriorly. Heart: Irreg Irreg, S1, S2, soft systolic murmur with mechanical valve click; no rub. Abdomen: Soft, normoactive bowel sounds. Extremities: No clubbing, cyanosis, edema.  Neuro: Alert and oriented X 3. Moves all extremities spontaneously. Psych: Normal affect.  Labs    Chemistry Recent Labs  Lab 01/23/2018 1544  01/21/18 0831 01/22/18 0601 01/23/18 0642 01/24/18 0412  NA 128*   < > 134* 131* 130* 134*  K 3.6   < > 3.3* 3.4* 3.8 2.8*  CL 92*   < > 98* 98* 99* 98*  CO2 24   < > 26 24 23  21*  GLUCOSE 141*   < > 97 111* 112* 138*  BUN 27*   < > 30* 29* 23* 18  CREATININE 1.88*   < > 1.35* 1.29* 1.12* 0.99  CALCIUM 8.2*   < > 8.2* 7.7* 7.6* 7.3*  PROT 6.7  --  6.4*  --   --  5.1*  ALBUMIN 2.5*  --  2.2*  --   --  1.7*  AST 40  --  37  --   --  32  ALT 17  --  16  --   --  13*  ALKPHOS 69  --  68  --   --  72  BILITOT 1.5*  --  1.8*  --   --  1.4*  GFRNONAA 24*   < > 35* 37* 44* 51*  GFRAA 27*   < > 41* 43* 51* 59*  ANIONGAP 12   < > 10 9 8 15    < > = values in this interval not displayed.     Hematology Recent Labs  Lab 01/22/18 0601 01/23/18 0642 01/24/18 0412  WBC 21.3* 22.2* 20.4*  RBC 2.47* 2.56* 2.49*  HGB 8.7* 9.1* 8.9*  HCT 25.4* 26.3* 25.8*  MCV 102.8* 102.7* 103.6*  MCH 35.2* 35.5* 35.7*  MCHC 34.3 34.6 34.5  RDW 13.5 13.7 14.2  PLT 176 195 212    Cardiac EnzymesNo results for input(s): TROPONINI in the last 168 hours. No results for input(s): TROPIPOC in the last 168 hours.   BNP Recent Labs  Lab 01/09/2018 2240  BNP 159.9*     DDimer No results for input(s): DDIMER in the last 168 hours.    Radiology    Ct Abdomen Pelvis W Contrast  Result Date: 01/22/2018 CLINICAL DATA:  Abdominal pain, lower mainly of unclear etiology. Prior CT Abd without contrast w PSBO> none clinically or on f/u Xrays. Leukocytosis. CT with PO and IV contrast. EXAM: CT ABDOMEN AND PELVIS WITH CONTRAST TECHNIQUE: Multidetector CT imaging of the abdomen and pelvis  was performed using the standard protocol following bolus administration of intravenous contrast. CONTRAST:  50mL ISOVUE-300 IOPAMIDOL (ISOVUE-300) INJECTION 61% COMPARISON:  CT of the abdomen and pelvis on 01/30/2018 and 07/20/2017 FINDINGS: Lower chest: Increased RIGHT pleural effusion. New LEFT pleural effusion. There is dependent consolidation bilaterally, RIGHT greater than LEFT. Cardiomegaly. Hepatobiliary: New ascites in the abdomen and pelvis. Cirrhotic contour of the liver. Stable appearance of small low-attenuation lesion within the RIGHT hepatic lobe measuring 9 millimeters. Gallbladder is distended. Calcified gallstones are present. Pancreas: Unremarkable. No pancreatic ductal dilatation or surrounding inflammatory changes. Spleen: Normal in size without focal abnormality. Adrenals/Urinary Tract: Adrenal glands are normal. No hydronephrosis. Bladder is decompressed by a Foley catheter. Stomach/Bowel: Stomach is decompressed. There is dilatation of small bowel loops, increased compared to prior study. There is smooth tapering of small bowel loops to the level of the RIGHT central pelvis where soft tissue in low-attenuation masses are identified, described below. The colonic loops are relatively decompressed. There are scattered colonic diverticula but no acute diverticulitis. The appendix is not well seen. Changes in the RIGHT LOWER QUADRANT raise the question of appendicitis versus other process. A dense background recommend correlation with previous surgical history if available. Vascular/Lymphatic: There is dense atherosclerotic calcification of the abdominal aorta. Although involved by atherosclerosis, there is vascular opacification of the celiac axis, superior mesenteric artery, and inferior mesenteric artery. Normal appearance of the portal venous system and inferior vena cava. Numerous collateral vessels are identified in the LEFT UPPER QUADRANT consistent with portal venous hypertension.  Reproductive: Hysterectomy. Other: New ascites.  Body wall edema. Within the RIGHT LOWER QUADRANT, adjacent to the pelvic sidewall, there are low-attenuation and higher attenuation collections. Low-attenuation collections measure 2.5 x 3.7 and 2.9 x 0.2 centimeters. Higher attenuation mass is 2.0 x 3.4 centimeters. There is associated thickening of the RIGHT peritoneal reflection. Considerations include appendiceal abscess, tumor, or ovarian mass. This region also appears to be in close proximity to the point of small bowel obstruction. Musculoskeletal: Degenerative changes are seen in the mid lumbar spine. IMPRESSION: 1. RIGHT LOWER QUADRANT pelvic side wall soft tissues and masses. Considerations include complex appendiceal  abscess, tumor or ovarian mass. When compared with the CT exam from October 2018, the findings favor an appendiceal abscess given the location of the appendix on the previous exam. 2. New ascites. 3. Increased RIGHT pleural effusion.  New LEFT pleural effusion. 4. Cirrhosis and portal venous hypertension. 5.  Aortic atherosclerosis.  (ICD10-I70.0) 6. Hysterectomy. These results were called by telephone at the time of interpretation on 01/22/2018 at 6:30 pm to Dr. Vernell Leep , who verbally acknowledged these results. Electronically Signed   By: Nolon Nations M.D.   On: 01/22/2018 18:43   Dg Chest Port 1 View  Result Date: 01/23/2018 CLINICAL DATA:  Status post PICC placement today. EXAM: PORTABLE CHEST 1 VIEW COMPARISON:  PA and lateral chest 01/20/2018. FINDINGS: New right PICC is in place with the tip projecting in the lower superior vena cava. Right pleural effusion and basilar airspace disease have worsened. Left lung is clear. No pneumothorax. There is cardiomegaly. IMPRESSION: Tip of right PICC projects in the lower superior vena cava. Small to moderate right pleural effusion and basilar airspace disease have increased since the most recent exam. Electronically Signed   By:  Inge Rise M.D.   On: 01/23/2018 14:30   [place] Ekg Site Rite  Result Date: 01/23/2018 If Site Rite image not attached, placement could not be confirmed due to current cardiac rhythm.   Cardiac Studies   N/a   Patient Profile     84 y.o. female with a history ofrheumatic valvular heart disease status post redo aortic valve replacement x3 and redo mitral valve surgery x2, persistent atrial fibrillation, coumadin for anticoagulation, chronic LE edema (multifactorial with diastolic CHF, venous insufficiency, cirrhosis)now admitted with SBO. We are assisting with management of diastolic heart failure and atrial fibrillation.   Assessment & Plan    1. Rheumatic heart disease s/p AVR x3 and MVR x2: Has a [employer] mechanical mitral valve and [employer] freestyle aortic root conduit.  -- coumadin has been held and planned for heparin bridge once INR<2 with plans for surgery.   2. Abd pain 2/2 to perforated appendicitis with abscess: CT scan with concern for perforated appendicitis, surgery following with plans for drain placement once INR <2.  INR 2.09 today, so possible surgery tomorrow.  3. Chronic Afib: on IV lopressor and home dose Dig. Rate controlled. Now on heparin bridge with coumadin held.   4. Acute on Chronic diastolic HF: Last echo from 11/18 with normal EF and no WMA. CXR on admission with edema and right sided pleural effusion. Remains on IV lasix. Net - 1.8L with weight trending down. Cr stable.   5. Hypokalemia: 2.8 this morning. IV supplement has been ordered.   Signed, Reino Bellis, NP  01/24/2018, 10:35 AM  Pager # [phone]   For questions or updates, please contact [place] Please consult [email or web address] for contact info under Cardiology/STEMI.

## 2018-01-24 NOTE — Progress Notes (Signed)
Progress Note: General Surgery Service   Assessment/Plan: Patient Active Problem List   Diagnosis Date Noted  . Partial small bowel obstruction ([place]) 01/04/2018  . Acute renal failure superimposed on stage 3 chronic kidney disease ([place]) 01/03/2018  . Atrial fibrillation with RVR ([place]) 01/08/2018  . Chronic systolic (congestive) heart failure ([place]) 01/11/2018  . Hyponatremia 01/23/2018  . Sepsis ([place]) 01/15/2018  . Liver cirrhosis ([place]) 01/03/2018  . Edema 03/06/2014  . Encounter for therapeutic drug monitoring 10/31/2013  . S/P MVR (mitral valve replacement) 09/14/2013  . S/P AVR (aortic valve replacement) 09/14/2013  . Heart valve replaced by other means 02/03/2011  . Long term current use of anticoagulant therapy 02/03/2011  . Aortic valve disorders 01/06/2011  . Elevated BP 01/06/2011  . Valvular heart disease   . Chronic atrial fibrillation ([place])   . Chronic anticoagulation   . Rheumatic heart disease   . Aortic insufficiency   . Arthritis    CT scan from 4/20 is concerning for perforated appendicitis. Given the leukocytosis, ileus, nausea and pain, this fits with her clinical picture. -Continue broad IV abx - INR to <2 in order for a procedure to be considered to drain the abscess -Continue TPN for nutritional support. This could make her liver worse but I think not giving her calories or protein for another week will worsen her overall prognosis of resolving this infection -ok to ambulate and work with physical therapy    LOS: 5 days  Chief Complaint/Subjective: Moderate pain in abdomen, reports nausea, no bowel movements  Objective: Vital signs in last 24 hours: Temp:  [97.9 F (36.6 C)-98.8 F (37.1 C)] 97.9 F (36.6 C) (04/22 0807) Pulse Rate:  [90-119] 90 (04/22 0807) Resp:  [13-19] 19 (04/22 0807) BP: (109-132)/(43-107) 113/43 (04/22 0807) SpO2:  [92 %-97 %] 92 % (04/22 0807) Weight:  [58.1 kg (128 lb)] 58.1 kg (128 lb) (04/22 0413) Last BM Date:  01/19/2018  Intake/Output from previous day: 04/21 0701 - 04/22 0700 In: 503.3 [IV Piggyback:503.3] Out: 2125 [Urine:2125] Intake/Output this shift: No intake/output data recorded.  Lungs: CTAB  Cardiovascular: RRR  Abd: soft, distended, TTP diffusely  Extremities: no edema  Neuro: AOx4  Lab Results: CBC  Recent Labs    01/23/18 0642 01/24/18 0412  WBC 22.2* 20.4*  HGB 9.1* 8.9*  HCT 26.3* 25.8*  PLT 195 212   BMET Recent Labs    01/23/18 0642 01/24/18 0412  NA 130* 134*  K 3.8 2.8*  CL 99* 98*  CO2 23 21*  GLUCOSE 112* 138*  BUN 23* 18  CREATININE 1.12* 0.99  CALCIUM 7.6* 7.3*   PT/INR Recent Labs    01/23/18 2019 01/24/18 0412  LABPROT 28.4* 23.3*  INR 2.69 2.09   ABG No results for input(s): PHART, HCO3 in the last 72 hours.  Invalid input(s): PCO2, PO2  Studies/Results:  Anti-infectives: Anti-infectives (From admission, onward)   Start     Dose/Rate Route Frequency Ordered Stop   01/22/18 1600  piperacillin-tazobactam (ZOSYN) IVPB 3.375 g     3.375 g 12.5 mL/hr over 240 Minutes Intravenous Every 8 hours 01/22/18 1014     01/20/18 1500  piperacillin-tazobactam (ZOSYN) IVPB 2.25 g  Status:  Discontinued     2.25 g 100 mL/hr over 30 Minutes Intravenous Every 6 hours 01/20/18 1211 01/22/18 1014   01/20/18 0500  piperacillin-tazobactam (ZOSYN) IVPB 2.25 g  Status:  Discontinued     2.25 g 100 mL/hr over 30 Minutes Intravenous Every 6 hours 01/13/2018 2220  01/20/18 1211   01/28/2018 2330  rifaximin (XIFAXAN) tablet 550 mg     550 mg Oral 2 times daily 01/14/2018 2224     01/12/2018 2230  piperacillin-tazobactam (ZOSYN) IVPB 3.375 g     3.375 g 100 mL/hr over 30 Minutes Intravenous  Once 01/03/2018 2220 01/11/2018 2300      Medications: Scheduled Meds: . digoxin  0.125 mg Oral Daily  . furosemide  20 mg Intravenous BID  . insulin aspart  0-9 Units Subcutaneous Q6H  . mouth rinse  15 mL Mouth Rinse BID  . metoprolol tartrate  2.5 mg Intravenous Q8H   . rifaximin  550 mg Oral BID  . sodium chloride flush  10-40 mL Intracatheter Q12H  . traZODone  100 mg Oral QHS   Continuous Infusions: . heparin 900 Units/hr (01/24/18 0830)  . piperacillin-tazobactam (ZOSYN)  IV 3.375 g (01/24/18 0525)  . potassium chloride    . TPN ADULT (ION) 30 mL/hr at 01/23/18 1728   PRN Meds:.acetaminophen **OR** acetaminophen, hydrALAZINE, morphine injection, ondansetron (ZOFRAN) IV, sodium chloride flush, zolpidem  Clovis Riley, MD [facility]

## 2018-01-25 DIAGNOSIS — I482 Chronic atrial fibrillation: Secondary | ICD-10-CM

## 2018-01-25 LAB — BASIC METABOLIC PANEL
Anion gap: 6 (ref 5–15)
BUN: 18 mg/dL (ref 6–20)
CHLORIDE: 100 mmol/L — AB (ref 101–111)
CO2: 30 mmol/L (ref 22–32)
Calcium: 7.7 mg/dL — ABNORMAL LOW (ref 8.9–10.3)
Creatinine, Ser: 0.94 mg/dL (ref 0.44–1.00)
GFR calc non Af Amer: 55 mL/min — ABNORMAL LOW (ref >60)
Glucose, Bld: 152 mg/dL — ABNORMAL HIGH (ref 65–99)
POTASSIUM: 3.1 mmol/L — AB (ref 3.5–5.1)
Sodium: 136 mmol/L (ref 135–145)

## 2018-01-25 LAB — HEPARIN LEVEL (UNFRACTIONATED): Heparin Unfractionated: <0.1 IU/mL — ABNORMAL LOW (ref 0.30–0.70)

## 2018-01-25 LAB — CBC
HEMATOCRIT: 26.5 % — AB (ref 36.0–46.0)
HEMOGLOBIN: 8.8 g/dL — AB (ref 12.0–15.0)
MCH: 35.1 pg — ABNORMAL HIGH (ref 26.0–34.0)
MCHC: 33.2 g/dL (ref 30.0–36.0)
MCV: 105.6 fL — ABNORMAL HIGH (ref 78.0–100.0)
Platelets: 207 10*3/uL (ref 150–400)
RBC: 2.51 MIL/uL — AB (ref 3.87–5.11)
RDW: 14.1 % (ref 11.5–15.5)
WBC: 22 10*3/uL — AB (ref 4.0–10.5)

## 2018-01-25 LAB — CULTURE, BLOOD (ROUTINE X 2)
CULTURE: NO GROWTH
Culture: NO GROWTH
Culture: NO GROWTH
SPECIAL REQUESTS: ADEQUATE
Special Requests: ADEQUATE
Special Requests: ADEQUATE

## 2018-01-25 LAB — PROTIME-INR
INR: 1.78
Prothrombin Time: 20.6 seconds — ABNORMAL HIGH (ref 11.4–15.2)

## 2018-01-25 LAB — GLUCOSE, CAPILLARY
GLUCOSE-CAPILLARY: 173 mg/dL — AB (ref 65–99)
Glucose-Capillary: 205 mg/dL — ABNORMAL HIGH (ref 65–99)
Glucose-Capillary: 133 mg/dL — ABNORMAL HIGH (ref 65–99)

## 2018-01-25 LAB — MAGNESIUM: Magnesium: 1.9 mg/dL (ref 1.7–2.4)

## 2018-01-25 MED ORDER — M.V.I. ADULT IV INJ
INJECTION | INTRAVENOUS | Status: AC
  Administered 2018-01-25: 17:00:00 via INTRAVENOUS
  Filled 2018-01-25: qty 648

## 2018-01-25 MED ORDER — MAGNESIUM SULFATE IN D5W 1-5 GM/100ML-% IV SOLN
1.0000 g | Freq: Once | INTRAVENOUS | Status: AC
  Administered 2018-01-25: 1 g via INTRAVENOUS
  Filled 2018-01-25: qty 100

## 2018-01-25 MED ORDER — POTASSIUM CHLORIDE 10 MEQ/100ML IV SOLN
10.0000 meq | INTRAVENOUS | Status: AC
  Administered 2018-01-25 (×2): 10 meq via INTRAVENOUS
  Filled 2018-01-25 (×2): qty 100

## 2018-01-25 MED ORDER — POTASSIUM CHLORIDE 10 MEQ/50ML IV SOLN
10.0000 meq | INTRAVENOUS | Status: AC
  Administered 2018-01-25 (×4): 10 meq via INTRAVENOUS
  Filled 2018-01-25 (×4): qty 50

## 2018-01-25 NOTE — Progress Notes (Signed)
Patient ID: [name] Chung, female   DOB: [birth year], 84 y.o.   MRN: [id number]   Was scheduled for abscess drain placement today  INR 1.78 this am  Will need to reschedule to 4/24  Will restart Hep now Pt may resume dite  Plan for 4/24  RN will be made aware

## 2018-01-25 NOTE — Progress Notes (Signed)
[place] for Heparin (Coumadin on hold) Indication: atrial fibrillation and mechanical AVR & MVR  Allergies  Allergen Reactions  . Asa Buff (Mag [Buffered Aspirin] Other (See Comments)    On coumadin  . Codeine   . Mirtazapine Nausea Only  . Ultram [Tramadol Hcl]     Patient Measurements: Height: 5' 5.5" (166.4 cm) Weight: 145 lb (65.8 kg) IBW/kg (Calculated) : 58.15 Heparin Dosing Weight: 58 kg  Vital Signs: Temp: 98.6 F (37 C) (04/23 0604) Temp Source: Oral (04/23 0604) BP: 116/43 (04/23 0208) Pulse Rate: 90 (04/23 0825)  Labs: Recent Labs    01/23/18 0321 01/23/18 2019 01/24/18 0412 01/24/18 1651 01/25/18 0502 01/25/18 0505  HGB 9.1*  --  8.9*  --  8.8*  --   HCT 26.3*  --  25.8*  --  26.5*  --   PLT 195  --  212  --  207  --   LABPROT 72.8* 28.4* 23.3*  --  20.6*  --   INR 8.98* 2.69 2.09  --  1.78  --   HEPARINUNFRC  --   --   --  <0.10*  --  <0.10*  CREATININE 1.12*  --  0.99  --  0.94  --     Estimated Creatinine Clearance: 41.7 mL/min (by C-G formula based on SCr of 0.94 mg/dL).  Assessment:   84 yr old female on Coumadin prior to admission for mechanical AVR & MVR and atrial fibrillation. Warfarin on hold for need for drain placement by IR- initial plan was for placement today, so heparin was turned off at 0620 this morning. This morning's heparin level was drawn before this occurred and resulted as undetectable. Heparin infusion was restarted ~0800 this morning d/t cancellation of IR procedure for INR of 1.78. No bleeding noted.  Goal of Therapy:  Heparin level 0.3-0.7 units/ml Monitor platelets by anticoagulation protocol: Yes   Plan:  Increase heparin drip to 1350 units/hr-Noted plan to hold IV heparin 3 hrs prior to drain placement Heparin level in 8h Daily heparin level, INR, and CBC Follow plans for anticoagulation after IR procedure  Nolita Kutter D. Thuan Tippett, PharmD, BCPS Clinical Pharmacist Clinical Phone  for 01/25/2018 until 3:30pm: [phone] If after 3:30pm, please call main pharmacy at [phone] 01/25/2018 10:02 AM

## 2018-01-25 NOTE — Progress Notes (Signed)
Progress Note: General Surgery Service   Assessment/Plan: Patient Active Problem List   Diagnosis Date Noted  . Partial small bowel obstruction ([place]) 01/15/2018  . Acute renal failure superimposed on stage 3 chronic kidney disease ([place]) 01/31/2018  . Atrial fibrillation with RVR ([place]) 01/05/2018  . Chronic systolic (congestive) heart failure ([place]) 01/25/2018  . Hyponatremia 01/11/2018  . Sepsis ([place]) 01/17/2018  . Liver cirrhosis ([place]) 01/13/2018  . Edema 03/06/2014  . Encounter for therapeutic drug monitoring 10/31/2013  . S/P MVR (mitral valve replacement) 09/14/2013  . S/P AVR (aortic valve replacement) 09/14/2013  . Heart valve replaced by other means 02/03/2011  . Long term current use of anticoagulant therapy 02/03/2011  . Aortic valve disorders 01/06/2011  . Elevated BP 01/06/2011  . Valvular heart disease   . Chronic atrial fibrillation ([place])   . Chronic anticoagulation   . Rheumatic heart disease   . Aortic insufficiency   . Arthritis    CT scan from 4/20 is concerning for perforated appendicitis. Given the leukocytosis, ileus, nausea and pain, this fits with her clinical picture. -Continue broad spectrum IV abx - INR 1.78 today; IR drain pending -Continue TPN for nutritional support. This could make her liver worse but I think not giving her calories or protein for another week will worsen her overall prognosis of resolving this infection -ok to ambulate and work with physical therapy    LOS: 6 days  Chief Complaint/Subjective: Moderate pain in abdomen, reports nausea, no bowel movements  Objective: Vital signs in last 24 hours: Temp:  [98.1 F (36.7 C)-98.9 F (37.2 C)] 98.6 F (37 C) (04/23 0604) Pulse Rate:  [87-110] 110 (04/23 0208) Resp:  [16-24] 16 (04/23 0208) BP: (113-132)/(43-58) 116/43 (04/23 0208) SpO2:  [88 %-98 %] 88 % (04/23 0208) Weight:  [65.8 kg (145 lb)] 65.8 kg (145 lb) (04/23 0300) Last BM Date: 01/31/2018  Intake/Output from previous  day: 04/22 0701 - 04/23 0700 In: 646.4 [I.V.:596.4; IV Piggyback:50] Out: 6389 [Urine:1825] Intake/Output this shift: No intake/output data recorded.  Lungs: CTAB  Cardiovascular: RRR  Abd: soft, moderately distended, TTP diffusely  Extremities: no edema  Neuro: AOx4  Lab Results: CBC  Recent Labs    01/24/18 0412 01/25/18 0502  WBC 20.4* 22.0*  HGB 8.9* 8.8*  HCT 25.8* 26.5*  PLT 212 207   BMET Recent Labs    01/24/18 0412 01/25/18 0502  NA 134* 136  K 2.8* 3.1*  CL 98* 100*  CO2 21* 30  GLUCOSE 138* 152*  BUN 18 18  CREATININE 0.99 0.94  CALCIUM 7.3* 7.7*   PT/INR Recent Labs    01/24/18 0412 01/25/18 0502  LABPROT 23.3* 20.6*  INR 2.09 1.78   ABG No results for input(s): PHART, HCO3 in the last 72 hours.  Invalid input(s): PCO2, PO2  Studies/Results:  Anti-infectives: Anti-infectives (From admission, onward)   Start     Dose/Rate Route Frequency Ordered Stop   01/22/18 1600  piperacillin-tazobactam (ZOSYN) IVPB 3.375 g     3.375 g 12.5 mL/hr over 240 Minutes Intravenous Every 8 hours 01/22/18 1014     01/20/18 1500  piperacillin-tazobactam (ZOSYN) IVPB 2.25 g  Status:  Discontinued     2.25 g 100 mL/hr over 30 Minutes Intravenous Every 6 hours 01/20/18 1211 01/22/18 1014   01/20/18 0500  piperacillin-tazobactam (ZOSYN) IVPB 2.25 g  Status:  Discontinued     2.25 g 100 mL/hr over 30 Minutes Intravenous Every 6 hours 01/26/2018 2220 01/20/18 1211   01/12/2018 2330  rifaximin (XIFAXAN) tablet 550 mg     550 mg Oral 2 times daily 01/05/2018 2224     01/25/2018 2230  piperacillin-tazobactam (ZOSYN) IVPB 3.375 g     3.375 g 100 mL/hr over 30 Minutes Intravenous  Once 01/29/2018 2220 01/03/2018 2300      Medications: Scheduled Meds: . digoxin  0.0625 mg Oral Daily  . furosemide  20 mg Intravenous BID  . insulin aspart  0-9 Units Subcutaneous Q6H  . mouth rinse  15 mL Mouth Rinse BID  . metoprolol tartrate  2.5 mg Intravenous Q8H  . rifaximin  550 mg  Oral BID  . sodium chloride flush  10-40 mL Intracatheter Q12H  . traZODone  100 mg Oral QHS   Continuous Infusions: . heparin Stopped (01/25/18 0620)  . piperacillin-tazobactam (ZOSYN)  IV 3.375 g (01/25/18 0625)  . TPN ADULT (ION) 50 mL/hr at 01/24/18 1912   PRN Meds:.acetaminophen **OR** acetaminophen, hydrALAZINE, morphine injection, ondansetron (ZOFRAN) IV, sodium chloride flush, zolpidem  Ileana Roup, [facility]

## 2018-01-25 NOTE — Progress Notes (Signed)
PROGRESS NOTE   [name] Chung  HKV:[id number]    DOB: 11-24-1932    DOA: 01/07/2018  PCP: Lajean Manes, MD   Brief Narrative:  84 year old married female, lives at [employer] [facility] in an apartment with her spouse who has dementia, independent, PMH of chronic A. fib, mechanical AVR & MVR on Coumadin, chronic diastolic CHF (Dr. Martinique, Cardiology), stage III chronic kidney disease, chronic lower extremity edema, hysterectomy presented to ED 01/31/2018 with 3 days history of nausea, nonbloody emesis, abdominal distention and pain for which she was seen by PCP 2 days PTA and started on Cipro for presumed UTI but UA returned negative so Cipro was stopped and also reported diarrhea.  PCP started Imodium with improvement in diarrhea.  In ED, initial noncontrasted CT abdomen and pelvis suggestive of PSBO.  General surgery was consulted and patient did not improve with conservative management for SBO.  After renal insufficiency improved, obtained CT abdomen and pelvis with contrast on 4/20 which is suggestive of perforated appendix with abscess.  Reversed INR with vitamin K.  IR consulted for CT-guided percutaneous drainage of abscess, but this has been deferred to 01/26/2018 due to elevated INR. INR is 1.78 today, 01/25/2018.  Cardiology assisting for decompensated CHF, A. fib with RVR.  01/25/2018: Patient's stepdaughter, who is also patient's power of attorney has insisted that the patient will have clear liquid diet.  I have explained to the patient's power of attorney that my professional opinion is for patient to be n.p.o., that the power of attorney has refused.  Risks of having patient on clear liquid explained in details to the patient's power of attorney, and the power of attorney voiced understanding.  Patient has a dementia illness.   Assessment & Plan:   Principal Problem:   Partial small bowel obstruction (HCC) Active Problems:   Chronic atrial fibrillation (HCC)   Long term current use of  anticoagulant therapy   S/P MVR (mitral valve replacement)   S/P AVR (aortic valve replacement)   Acute renal failure superimposed on stage 3 chronic kidney disease (HCC)   Atrial fibrillation with RVR (HCC)   Chronic systolic (congestive) heart failure (HCC)   Hyponatremia   Sepsis ([place])   Liver cirrhosis (HCC)   Perforated appendicular abscess and small bowel ileus: Patient had noncontrasted CT abdomen and pelvis on admission which suggested partial small bowel obstruction.  General surgery was consulted.  She was treated conservatively for PSBO but did not improve.  After renal insufficiency had improved, she underwent CT abdomen and pelvis with contrast on 4/20 which confirmed suspected perforated appendix with abscess.  Case was extensively discussed with patient, her step daughter, surgical and cardiology teams.  Patient wishes to proceed with appropriate interventions as needed including drain or surgery.  General surgery recommends percutaneous drain of abscess by IR but needs INR to be less than 2.  Initially reluctant to reverse INR due to mechanical valve.  Now due to need for surgical intervention, reversed INR with 2 doses of vitamin K (2.5 mg PO x1 followed by 2.5 mg IV x1 on 4/21).  INR down to 2.09.  IV Zosyn continued.  Since patient has had poor oral intake for several days which may continue for several more, PICC line placed and TPN started for nutrition.  Mobilize. NPO except ice chips and meds with sips.  CEA: 5.6 and CA 125: 184.3 (DD: Tumor versus ovarian carcinoma).  When drain placed, hopefully fluid can be sent for cytology.  Acute urinary retention:  She had recurrent acute urinary retention despite in and out Foley catheter x3 and hence Foley catheter was left in place.  Likely precipitated by acute pain and opioids.  Continue Foley for now.  Sepsis secondary to perforated appendix: Patient met sepsis criteria on admission.  Lactate normal. PCT 24.83.  Chest x-ray without  pneumonia.  Now we know that this was from suspected Appendicular abscess.  Continue IV Zosyn.  Blood cultures x2: Negative to date.  Urine culture shows insignificant growth.  Chronic A. fib with RVR: CHA2DS2-VASc Score is 4.  Presented with heart rate in the 110s-130s, likely related to PSBO associated symptoms.  Digoxin level low on admission.  Patient sees Dr. Martinique, cardiology.  Cardiology follow-up appreciated.  Continue IV metoprolol 2.5 mg every 8 hourly and oral digoxin.  Rate better controlled now.  Supratherapeutic INR: Progressively increased to 9.49 on 4/19.  Likely related to poor oral intake in the context of GI symptoms, acute illness and underlying cirrhosis.  Last dose of Coumadin was 01/22/2018.  Initially did not reverse INR due to no bleeding and presence of mechanical valve.  However now with need for surgical intervention, after discussing with cardiology, general surgery and I also discussed with hematologist on call 01/23/18 reversed INR with 2 doses of vitamin K as indicated above.  INR down to 2.09.  Discussed with pharmacy 4/22 and initiated IV heparin drip.  Follow INR closely.  As per hematology, does not recommend FFP unless bleeding.  01/25/18: INR today is 1.78.  Status post mechanical MVR & AVR: Management as above.  Acute on chronic diastolic CHF: TTE 12/75/17 shows LVEF 60-65%.  Patient reports chronic lower extremity edema.  Diuretics including Lasix, metolazone, HCTZ, spironolactone temporarily held due to GI losses, acute kidney injury and SIRS nature.  Cardiology input appreciated.  Has been started on IV Lasix 20 mg twice daily.  Diuresing and -2.28 L since admission.  Improved.  Acute on stage III chronic kidney disease: Baseline creatinine 1-1.3.  Patient presented with creatinine of 1.88.  Likely prerenal due to dehydration from GI losses and diuretics.  Creatinine has normalized.  Dehydration with hyponatremia: Secondary to GI losses and diuretics.  Management as  above.  Sodium is improved from 128-131.  TSH normal.  Hyponatremia stable.  Dehydration resolved.  Now on TPN.  Liver cirrhosis and portal hypertension: Confirmed on CT abdomen 4/20.  Continue rifaximin.  Ammonia level normal.  Microcytic anemia: Unclear etiology.  Hemoglobin gradually drifting down/8.7 in the absence of overt bleeding.  Hemoglobin stable.  Leukocytosis: Secondary to infectious etiology as above.  Continue Zosyn.  Leukocytosis slowly improving.  Weight loss: Reports 16 pound weight loss since November 2018.  May be multifactorial related to aggressive diuresis for leg edema/CHF, poor oral intake related to intermittent nausea and vomiting from potassium supplements.  Unable to remember if she had colonoscopy but has been advised that she is not a good candidate by her PCP/GI (Dr. Paulita Fujita).  Outpatient follow-up.?  Could the right lower quadrant issue be malignancy.  Please see discussion above and may need further evaluation.  Very poor surgical candidate.  Hypokalemia/hypomagnesemia: Management per pharmacy via TPN.   DVT prophylaxis: IV heparin initiated by pharmacy 4/22. Code Status: Full. Family Communication: I discussed in detail with patient's stepdaughter at bedside and caregiver.  Updated care and answered questions. Disposition: To be determined.  Not medically stable for discharge.   Consultants:  General surgery Cardiology IR: General surgery have consulted IR for percutaneous drain.  Procedures:  In and out catheterization as needed for urinary retention. TED hose. PICC line  Antimicrobials:  IV Zosyn   Subjective: Patient is a poor historian.  Patient continues to report lower abdominal pain.  The patient's power of attorney has directed that the patient has clear liquid diet.  ROS: As above  Objective:  Vitals:   01/25/18 0208 01/25/18 0300 01/25/18 0604 01/25/18 0825  BP: (!) 116/43     Pulse: (!) 110   90  Resp: 16     Temp:   98.6 F (37  C)   TempSrc:   Oral   SpO2: (!) 88%     Weight:  65.8 kg (145 lb)    Height:        Examination:  General exam: Pleasant elderly female, moderately built and nourished lying comfortably propped up in bed.  Oral mucosa moist. Respiratory system: Clear to auscultation.  No increased work of breathing.  Stable. Cardiovascular system: S1 and S2 heard, irregularly irregular.  No JVD, murmurs.  Crisp click of mechanical mitral valve heard.  Telemetry personally reviewed and shows A. fib with controlled ventricular rate in the 90s. Gastrointestinal system: Abdomen is mildly distended, diffuse tenderness, especially lower quadrants with voluntary guarding but no rigidity or rebound.  Scant bowel sounds heard.  No organomegaly or masses appreciated. Central nervous system: Alert and oriented. No focal neurological deficits.  Stable. Extremities: Symmetric 5 x 5 power.  Data Reviewed: I have personally reviewed following labs and imaging studies  CBC: Recent Labs  Lab 01/21/18 0831 01/22/18 0601 01/23/18 0642 01/24/18 0412 01/25/18 0502  WBC 24.3* 21.3* 22.2* 20.4* 22.0*  NEUTROABS  --   --   --  15.7*  --   HGB 9.5* 8.7* 9.1* 8.9* 8.8*  HCT 28.3* 25.4* 26.3* 25.8* 26.5*  MCV 104.0* 102.8* 102.7* 103.6* 105.6*  PLT 191 176 195 212 147   Basic Metabolic Panel: Recent Labs  Lab 01/21/18 0831 01/22/18 0601 01/23/18 0642 01/24/18 0412 01/25/18 0502  NA 134* 131* 130* 134* 136  K 3.3* 3.4* 3.8 2.8* 3.1*  CL 98* 98* 99* 98* 100*  CO2 _0 21* 30  GLUCOSE 97 111* 112* 138* 152*  BUN 30* 29* 23* 18 18  CREATININE 1.35* 1.29* 1.12* 0.99 0.94  CALCIUM 8.2* 7.7* 7.6* 7.3* 7.7*  MG  --  1.6* 1.9 1.7 1.9  PHOS  --   --  2.5 3.0  --    Liver Function Tests: Recent Labs  Lab 01/08/2018 1544 01/21/18 0831 01/24/18 0412  AST 40 37 32  ALT 17 16 13*  ALKPHOS 69 68 72  BILITOT 1.5* 1.8* 1.4*  PROT 6.7 6.4* 5.1*  ALBUMIN 2.5* 2.2* 1.7*   Coagulation Profile: Recent Labs  Lab  01/22/18 1950 01/23/18 0642 01/23/18 2019 01/24/18 0412 01/25/18 0502  INR 9.70* 8.98* 2.69 2.09 1.78    CBG: Recent Labs  Lab 01/24/18 1248 01/24/18 1756 01/24/18 2357 01/25/18 0616 01/25/18 1241  GLUCAP 151* 165* 164* 173* 205*    Recent Results (from the past 240 hour(s))  Urine Culture     Status: Abnormal   Collection Time: 01/16/2018 10:19 PM  Result Value Ref Range Status   Specimen Description URINE, RANDOM  Final   Special Requests NONE  Final   Culture (A)  Final    <10,000 COLONIES/mL INSIGNIFICANT GROWTH Performed at [facility], [place] [address] [place], [place] [address]    Report Status 01/21/2018 FINAL  Final  Culture, blood (x 2)     Status: None   Collection Time: 01/27/2018 10:46 PM  Result Value Ref Range Status   Specimen Description BLOOD RIGHT ANTECUBITAL  Final   Special Requests   Final    BOTTLES DRAWN AEROBIC AND ANAEROBIC Blood Culture adequate volume   Culture   Final    NO GROWTH 5 DAYS Performed at [facility], [address], [place], [place] [address]    Report Status 01/25/2018 FINAL  Final  Culture, blood (Routine X 2) w Reflex to ID Panel     Status: None   Collection Time: 01/20/18 12:24 PM  Result Value Ref Range Status   Specimen Description BLOOD RIGHT HAND  Final   Special Requests   Final    BOTTLES DRAWN AEROBIC AND ANAEROBIC Blood Culture adequate volume   Culture   Final    NO GROWTH 5 DAYS Performed at [facility], [place] [address] [place], [place] [address]    Report Status 01/25/2018 FINAL  Final  Culture, blood (Routine X 2) w Reflex to ID Panel     Status: None   Collection Time: 01/20/18  8:24 PM  Result Value Ref Range Status   Specimen Description BLOOD RIGHT ARM  Final   Special Requests   Final    BOTTLES DRAWN AEROBIC ONLY Blood Culture adequate volume   Culture   Final    NO GROWTH 5 DAYS Performed at [facility], [place] [address] [place], [place] [address]    Report Status  01/25/2018 FINAL  Final  MRSA PCR Screening     Status: None   Collection Time: 01/23/18  5:18 AM  Result Value Ref Range Status   MRSA by PCR NEGATIVE NEGATIVE Final    Comment:        The GeneXpert MRSA Assay (FDA approved for NASAL specimens only), is one component of a comprehensive MRSA colonization surveillance program. It is not intended to diagnose MRSA infection nor to guide or monitor treatment for MRSA infections. Performed at [facility], [place] [address], [place], [place] [address]          Radiology Studies: No results found.      Scheduled Meds: . digoxin  0.0625 mg Oral Daily  . furosemide  20 mg Intravenous BID  . insulin aspart  0-9 Units Subcutaneous Q6H  . mouth rinse  15 mL Mouth Rinse BID  . metoprolol tartrate  2.5 mg Intravenous Q8H  . rifaximin  550 mg Oral BID  . sodium chloride flush  10-40 mL Intracatheter Q12H  . traZODone  100 mg Oral QHS   Continuous Infusions: . heparin 1,350 Units/hr (01/25/18 1400)  . magnesium sulfate 1 - 4 g bolus IVPB    . piperacillin-tazobactam (ZOSYN)  IV 3.375 g (01/25/18 1514)  . potassium chloride    . TPN ADULT (ION) 50 mL/hr at 01/24/18 1912  . TPN ADULT (ION)       LOS: 6 days     Bonnell Public, MD. [facility] Pager [phone]  If 7PM-7AM, please contact night-coverage [email or web address] Password [facility] 01/25/2018, 3:43 PM

## 2018-01-25 NOTE — Progress Notes (Signed)
Pharmacy Antibiotic Note  [name] Chung is a 84 y.o. female admitted on 01/13/2018 with sepsis.  Pharmacy has been consulted for Zosyn dosing. AKI on admission, SCr now down to 0.9. Awaiting drain placement by IR for abscess.  Plan: Zosyn 3.375g IV q8h (4 hour infusion) Monitor clinical progress, c/s, renal function Follow for de-escalation plan/LOT   Height: 5' 5.5" (166.4 cm) Weight: 145 lb (65.8 kg) IBW/kg (Calculated) : 58.15  Temp (24hrs), Avg:98.5 F (36.9 C), Min:98.1 F (36.7 C), Max:98.9 F (37.2 C)  Recent Labs  Lab 01/13/2018 2240 01/20/18 0118  01/21/18 0831 01/22/18 0601 01/23/18 0642 01/24/18 0412 01/25/18 0502  WBC  --   --    < > 24.3* 21.3* 22.2* 20.4* 22.0*  CREATININE  --   --    < > 1.35* 1.29* 1.12* 0.99 0.94  LATICACIDVEN 1.9 1.1  --   --   --   --   --   --    < > = values in this interval not displayed.    Estimated Creatinine Clearance: 41.7 mL/min (by C-G formula based on SCr of 0.94 mg/dL).    Allergies  Allergen Reactions  . Asa Buff (Mag [Buffered Aspirin] Other (See Comments)    On coumadin  . Codeine   . Mirtazapine Nausea Only  . Ultram [Tramadol Hcl]    Zosyn 4/18>>   4/21 MRSA PCR: neg 4/18 BCx: ngtd 4/17 Bcx x1: ngtd 4/17 urine: insig growth  Laken Lobato D. Ashleah Valtierra, PharmD, BCPS Clinical Pharmacist Clinical Phone for 01/25/2018 until 3:30pm: [phone] If after 3:30pm, please call main pharmacy at [phone] 01/25/2018 10:04 AM

## 2018-01-25 NOTE — Progress Notes (Signed)
PHARMACY - ADULT TOTAL PARENTERAL NUTRITION CONSULT NOTE   Pharmacy Consult:  TPN Indication:  Prolonged ileus / Prolonged NPO status  Patient Measurements: Height: 5' 5.5" (166.4 cm) Weight: 145 lb (65.8 kg) IBW/kg (Calculated) : 58.15 TPN AdjBW (KG): 64 Body mass index is 23.76 kg/m.   Assessment:  63 YOF presented on 01/27/2018 with N/V/D and abdominal pain, found to have pSBO.  She was placed on a clear liquid diet on 01/21/18, which her caregiver reported that she didn't eat much.  01/22/18 CT concerning for perforated appendicitis and patient may need surgery; therefore, she was made NPO again.  Today is day #5 of essentially NPO status and MD suspects patient will not be able to eat adequately within the next week.  Pharmacy consulted to initiate TPN.   GI: cirrhosis with portal venous HTN.  LBM 4/17 - rifaximin, PRN Zofran, tolerated first day of TPN. Prealbumin very low Endo: no hx DM, cbgs 100-170s Insulin requirements in the past 24 hours: 8 units ssi Lytes: slightly low Na/CL prior to TPN, K 3.1 (goal >/= 4 for ileus), Mag 1.9 (goal >/= 2 for ileus) Renal: SCr 0.9, UOP 1.2 ml/kg/hr Pulm: 1.5L [place] Cards: Rheumatic heart dz - BP controlled, tachy (in Afib) - digoxin, Lasix IV, IV metoprolol AC: Coumadin PTA for Afib / mech valves >> INR 8.98, Vitamin K and to transition to IV Heparin - hgb 8.8, plts WNL Hepatobil: LFTs WNL, tbili 1.4 Neuro: trazodone, PRN morphine - pain score 7 ID: Zosyn for appendiceal abscess/enteritis - afebrile, WBC 20 TPN Access: PICC placed 01/23/18 TPN start date: 01/23/18   Nutritional Goals (per RD 4/22): 1365-1575 kCal and 81-93 g protein per day   Current Nutrition:  NPO   Plan:  -Continue TPN at 50 ml/hr (goal ~65 ml/hr) -TPN will provide 65 g AA, 192 g CHO and 29 g ILE, which equals to 1200 kCal, meeting ~75% of patients needs -Electrolytes in TPN: increase Mag/K+, Cl:Ac to 1:1, other lytes to standard -KCl 10 mEq x4  -Magnesium 1 g IV  x1 -Daily multivitamin and trace elements in TPN -Sensitive SSI Q6H     Harvel Quale 01/25/2018 8:17 AM

## 2018-01-25 NOTE — Progress Notes (Signed)
Progress Note  Patient Name: [name] Chung Date of Encounter: 01/25/2018  Primary Cardiologist: Peter Martinique, MD  Subjective   Laying bed,  weak,  Wants something to drink  Inpatient Medications    Scheduled Meds: . digoxin  0.0625 mg Oral Daily  . furosemide  20 mg Intravenous BID  . insulin aspart  0-9 Units Subcutaneous Q6H  . mouth rinse  15 mL Mouth Rinse BID  . metoprolol tartrate  2.5 mg Intravenous Q8H  . rifaximin  550 mg Oral BID  . sodium chloride flush  10-40 mL Intracatheter Q12H  . traZODone  100 mg Oral QHS   Continuous Infusions: . heparin 1,100 Units/hr (01/25/18 0836)  . magnesium sulfate 1 - 4 g bolus IVPB    . piperacillin-tazobactam (ZOSYN)  IV 3.375 g (01/25/18 0625)  . potassium chloride    . TPN ADULT (ION) 50 mL/hr at 01/24/18 1912  . TPN ADULT (ION)     PRN Meds: acetaminophen **OR** acetaminophen, hydrALAZINE, morphine injection, ondansetron (ZOFRAN) IV, sodium chloride flush, zolpidem   Vital Signs    Vitals:   01/25/18 0208 01/25/18 0300 01/25/18 0604 01/25/18 0825  BP: (!) 116/43     Pulse: (!) 110   90  Resp: 16     Temp:   98.6 F (37 C)   TempSrc:   Oral   SpO2: (!) 88%     Weight:  145 lb (65.8 kg)    Height:        Intake/Output Summary (Last 24 hours) at 01/25/2018 0951 Last data filed at 01/25/2018 0618 Gross per 24 hour  Intake 646.43 ml  Output 1825 ml  Net -1178.57 ml   Filed Weights   01/23/18 0410 01/24/18 0413 01/25/18 0300  Weight: 134 lb (60.8 kg) 128 lb (58.1 kg) 145 lb (65.8 kg)    Telemetry    AFib Rate 80-100 - Personally Reviewed  ECG    N/a  - Personally Reviewed  Physical Exam   General: Thin frail older W female appearing in no acute distress. Head: Normocephalic, atraumatic.  Neck: Supple, no JVD. Lungs:  Resp regular and unlabored, Clear anteriorly. Heart: Irreg Irreg, S1, S2, soft systolic murmur with good mechanical valve AV and MV clicks; no rub. Abdomen: Soft, normoactive bowel  sounds. Extremities: No clubbing, cyanosis, edema.  Neuro: Alert and oriented X 3. Moves all extremities spontaneously. Psych: Normal affect.  Labs    Chemistry Recent Labs  Lab 01/30/2018 1544  01/21/18 0831  01/23/18 1093 01/24/18 0412 01/25/18 0502  NA 128*   < > 134*   < > 130* 134* 136  K 3.6   < > 3.3*   < > 3.8 2.8* 3.1*  CL 92*   < > 98*   < > 99* 98* 100*  CO2 24   < > 26   < > 23 21* 30  GLUCOSE 141*   < > 97   < > 112* 138* 152*  BUN 27*   < > 30*   < > 23* 18 18  CREATININE 1.88*   < > 1.35*   < > 1.12* 0.99 0.94  CALCIUM 8.2*   < > 8.2*   < > 7.6* 7.3* 7.7*  PROT 6.7  --  6.4*  --   --  5.1*  --   ALBUMIN 2.5*  --  2.2*  --   --  1.7*  --   AST 40  --  37  --   --  32  --   ALT 17  --  16  --   --  13*  --   ALKPHOS 69  --  68  --   --  72  --   BILITOT 1.5*  --  1.8*  --   --  1.4*  --   GFRNONAA 24*   < > 35*   < > 44* 51* 55*  GFRAA 27*   < > 41*   < > 51* 59* >60  ANIONGAP 12   < > 10   < > 8 15 6    < > = values in this interval not displayed.     Hematology Recent Labs  Lab 01/23/18 0263 01/24/18 0412 01/25/18 0502  WBC 22.2* 20.4* 22.0*  RBC 2.56* 2.49* 2.51*  HGB 9.1* 8.9* 8.8*  HCT 26.3* 25.8* 26.5*  MCV 102.7* 103.6* 105.6*  MCH 35.5* 35.7* 35.1*  MCHC 34.6 34.5 33.2  RDW 13.7 14.2 14.1  PLT 195 212 207    Cardiac EnzymesNo results for input(s): TROPONINI in the last 168 hours. No results for input(s): TROPIPOC in the last 168 hours.   BNP Recent Labs  Lab 01/22/2018 2240  BNP 159.9*     DDimer No results for input(s): DDIMER in the last 168 hours.    Radiology    Dg Chest Port 1 View  Result Date: 01/23/2018 CLINICAL DATA:  Status post PICC placement today. EXAM: PORTABLE CHEST 1 VIEW COMPARISON:  PA and lateral chest 01/20/2018. FINDINGS: New right PICC is in place with the tip projecting in the lower superior vena cava. Right pleural effusion and basilar airspace disease have worsened. Left lung is clear. No pneumothorax. There  is cardiomegaly. IMPRESSION: Tip of right PICC projects in the lower superior vena cava. Small to moderate right pleural effusion and basilar airspace disease have increased since the most recent exam. Electronically Signed   By: Inge Rise M.D.   On: 01/23/2018 14:30    Cardiac Studies   N/a   Patient Profile     84 y.o. female with a history ofrheumatic valvular heart disease status post redo aortic valve replacement x3 and redo mitral valve surgery x2, persistent atrial fibrillation, coumadin for anticoagulation, chronic LE edema (multifactorial with diastolic CHF, venous insufficiency, cirrhosis)now admitted with SBO. We are assisting with management of diastolic heart failure and atrial fibrillation.   Assessment & Plan    1. Rheumatic heart disease s/p AVR x3 and MVR x2: Has a [employer] mechanical mitral valve and [employer] freestyle aortic root conduit.  -- coumadin has been held and planned for heparin bridge once INR<2. INR down to 1.79 today. Plan for IR drainage of abscess in am. Continue IV heparin for now.   2. Abd pain 2/2 to perforated appendicitis with abscess: CT scan with concern for perforated appendicitis, surgery following with plans for drain placement once INR acceptable for IR  3. Chronic Afib: on IV lopressor and home dose Dig. Rate controlled. Now on heparin bridge with coumadin held.   4. Acute on Chronic diastolic HF: Last echo from 11/18 with normal EF and no WMA. CXR on admission with edema and right sided pleural effusion. Remains on IV lasix. Net - 1.8L with weight trending down. Cr stable.   5. Hypokalemia: 3.1 this morning. IV supplement has been ordered.   Signed, Peter Martinique, MD  01/25/2018, 9:51 AM   For questions or updates, please contact [place] Please consult [email or web address] for contact info under Cardiology/STEMI.

## 2018-01-25 NOTE — Progress Notes (Signed)
ANTICOAGULATION CONSULT NOTE - Follow Up Consult  Pharmacy Consult for Heparin (Coumadin on hold) Indication: atrial fibrillation and mechanical AVR & MVR  Allergies  Allergen Reactions  . Asa Buff (Mag [Buffered Aspirin] Other (See Comments)    On coumadin  . Codeine   . Mirtazapine Nausea Only  . Ultram [Tramadol Hcl]     Patient Measurements: Height: 5' 5.5" (166.4 cm) Weight: 145 lb (65.8 kg) IBW/kg (Calculated) : 58.15 Heparin Dosing Weight: 58 kg  Vital Signs: BP: 121/47 (04/23 1808) Pulse Rate: 99 (04/23 1808)  Labs: Recent Labs    01/23/18 0642 01/23/18 2019 01/24/18 0412 01/24/18 1651 01/25/18 0502 01/25/18 0505 01/25/18 1840  HGB 9.1*  --  8.9*  --  8.8*  --   --   HCT 26.3*  --  25.8*  --  26.5*  --   --   PLT 195  --  212  --  207  --   --   LABPROT 72.8* 28.4* 23.3*  --  20.6*  --   --   INR 8.98* 2.69 2.09  --  1.78  --   --   HEPARINUNFRC  --   --   --  <0.10*  --  <0.10* <0.10*  CREATININE 1.12*  --  0.99  --  0.94  --   --     Estimated Creatinine Clearance: 41.7 mL/min (by C-G formula based on SCr of 0.94 mg/dL).  Assessment:   84 yr old female on Coumadin prior to admission for mechanical AVR & MVR and atrial fibrillation.   Coumadin on hold for abdominal drain when INR down. Vitamin K given 4/21. INR down to 1.78 today.       Heparin level remains undetectable (<0.1) on 1350 units/hr.  No known infusion problems or bleeding.  Goal of Therapy:  Heparin level 0.3-0.7 units/ml Monitor platelets by anticoagulation protocol: Yes   Plan:   Increase heparin drip to 1600 units/hr  Next heparin level and CBC in am.  Daily PT/INR.  Noted plan to hold IV heparin 3 hrs prior to drain placement.  Will follow up for heparin plans pre- and post-procedure.  Arty Baumgartner,  Pager: [phone] 01/25/2018,8:27 PM

## 2018-01-26 ENCOUNTER — Ambulatory Visit (HOSPITAL_COMMUNITY): Payer: Medicare Other

## 2018-01-26 DIAGNOSIS — I5022 Chronic systolic (congestive) heart failure: Secondary | ICD-10-CM

## 2018-01-26 LAB — CBC WITH DIFFERENTIAL/PLATELET
BAND NEUTROPHILS: 0 %
BLASTS: 0 %
Basophils Absolute: 0 10*3/uL (ref 0.0–0.1)
Basophils Relative: 0 %
EOS PCT: 2 %
Eosinophils Absolute: 0.5 10*3/uL (ref 0.0–0.7)
HEMATOCRIT: 26.3 % — AB (ref 36.0–46.0)
HEMOGLOBIN: 8.9 g/dL — AB (ref 12.0–15.0)
LYMPHS ABS: 3.2 10*3/uL (ref 0.7–4.0)
LYMPHS PCT: 12 %
MCH: 36 pg — AB (ref 26.0–34.0)
MCHC: 33.8 g/dL (ref 30.0–36.0)
MCV: 106.5 fL — ABNORMAL HIGH (ref 78.0–100.0)
MONOS PCT: 5 %
Metamyelocytes Relative: 2 %
Monocytes Absolute: 1.4 10*3/uL — ABNORMAL HIGH (ref 0.1–1.0)
Myelocytes: 1 %
NEUTROS ABS: 21.9 10*3/uL — AB (ref 1.7–7.7)
Neutrophils Relative %: 78 %
OTHER: 0 %
Platelets: 195 10*3/uL (ref 150–400)
Promyelocytes Relative: 0 %
RBC: 2.47 MIL/uL — AB (ref 3.87–5.11)
RDW: 14.5 % (ref 11.5–15.5)
WBC: 27 10*3/uL — AB (ref 4.0–10.5)
nRBC: 0 /100 WBC

## 2018-01-26 LAB — HEPARIN LEVEL (UNFRACTIONATED)
HEPARIN UNFRACTIONATED: 0.14 [IU]/mL — AB (ref 0.30–0.70)
Heparin Unfractionated: <0.1 IU/mL — ABNORMAL LOW (ref 0.30–0.70)

## 2018-01-26 LAB — PROTIME-INR
INR: 1.94
Prothrombin Time: 22 seconds — ABNORMAL HIGH (ref 11.4–15.2)

## 2018-01-26 LAB — RENAL FUNCTION PANEL
ANION GAP: 4 — AB (ref 5–15)
Albumin: 1.6 g/dL — ABNORMAL LOW (ref 3.5–5.0)
BUN: 19 mg/dL (ref 6–20)
CALCIUM: 7.7 mg/dL — AB (ref 8.9–10.3)
CO2: 40 mmol/L — AB (ref 22–32)
CREATININE: 0.94 mg/dL (ref 0.44–1.00)
Chloride: 102 mmol/L (ref 101–111)
GFR calc non Af Amer: 55 mL/min — ABNORMAL LOW (ref >60)
Glucose, Bld: 144 mg/dL — ABNORMAL HIGH (ref 65–99)
Phosphorus: 2.2 mg/dL — ABNORMAL LOW (ref 2.5–4.6)
Potassium: 4.2 mmol/L (ref 3.5–5.1)
SODIUM: 146 mmol/L — AB (ref 135–145)

## 2018-01-26 LAB — GLUCOSE, CAPILLARY
GLUCOSE-CAPILLARY: 197 mg/dL — AB (ref 65–99)
GLUCOSE-CAPILLARY: 157 mg/dL — AB (ref 65–99)
Glucose-Capillary: 138 mg/dL — ABNORMAL HIGH (ref 65–99)
Glucose-Capillary: 146 mg/dL — ABNORMAL HIGH (ref 65–99)
Glucose-Capillary: 163 mg/dL — ABNORMAL HIGH (ref 65–99)

## 2018-01-26 LAB — ABO/RH: ABO/RH(D): A POS

## 2018-01-26 MED ORDER — MIDAZOLAM HCL 2 MG/2ML IJ SOLN
INTRAMUSCULAR | Status: AC | PRN
  Administered 2018-01-26: 1 mg via INTRAVENOUS

## 2018-01-26 MED ORDER — SODIUM CHLORIDE 0.9 % IV SOLN
Freq: Once | INTRAVENOUS | Status: AC
  Administered 2018-01-26: 10:00:00 via INTRAVENOUS

## 2018-01-26 MED ORDER — LIDOCAINE HCL 1 % IJ SOLN
INTRAMUSCULAR | Status: AC
  Filled 2018-01-26: qty 20

## 2018-01-26 MED ORDER — MIDAZOLAM HCL 2 MG/2ML IJ SOLN
INTRAMUSCULAR | Status: AC
  Filled 2018-01-26: qty 2

## 2018-01-26 MED ORDER — HEPARIN (PORCINE) IN NACL 100-0.45 UNIT/ML-% IJ SOLN
2000.0000 [IU]/h | INTRAMUSCULAR | Status: DC
Start: 2018-01-26 — End: 2018-02-01
  Administered 2018-01-26: 1800 [IU]/h via INTRAVENOUS
  Administered 2018-01-27: 1900 [IU]/h via INTRAVENOUS
  Administered 2018-01-28: 2000 [IU]/h via INTRAVENOUS
  Administered 2018-01-28 – 2018-01-29 (×3): 2150 [IU]/h via INTRAVENOUS
  Administered 2018-01-30: 2050 [IU]/h via INTRAVENOUS
  Administered 2018-01-30: 2150 [IU]/h via INTRAVENOUS
  Administered 2018-01-31: 2050 [IU]/h via INTRAVENOUS
  Administered 2018-01-31: 2000 [IU]/h via INTRAVENOUS
  Filled 2018-01-26 (×9): qty 250

## 2018-01-26 MED ORDER — VITAMIN K1 10 MG/ML IJ SOLN
2.5000 mg | Freq: Once | INTRAVENOUS | Status: AC
  Administered 2018-01-26: 2.5 mg via INTRAVENOUS
  Filled 2018-01-26: qty 0.25

## 2018-01-26 MED ORDER — TRAVASOL 10 % IV SOLN
INTRAVENOUS | Status: AC
  Administered 2018-01-26: 18:00:00 via INTRAVENOUS
  Filled 2018-01-26: qty 842.4

## 2018-01-26 MED ORDER — SODIUM CHLORIDE 0.9 % IV SOLN
INTRAVENOUS | Status: AC | PRN
  Administered 2018-01-26: 10 mL/h via INTRAVENOUS

## 2018-01-26 MED ORDER — FENTANYL CITRATE (PF) 100 MCG/2ML IJ SOLN
INTRAMUSCULAR | Status: AC
  Filled 2018-01-26: qty 2

## 2018-01-26 MED ORDER — FENTANYL CITRATE (PF) 100 MCG/2ML IJ SOLN
INTRAMUSCULAR | Status: AC | PRN
  Administered 2018-01-26: 50 ug via INTRAVENOUS

## 2018-01-26 MED ORDER — SODIUM CHLORIDE 0.9% FLUSH
5.0000 mL | Freq: Three times a day (TID) | INTRAVENOUS | Status: DC
  Administered 2018-01-26 – 2018-02-01 (×13): 5 mL
  Administered 2018-02-02: 10 mL
  Administered 2018-02-02: 5 mL
  Administered 2018-02-03 – 2018-02-04 (×3): 10 mL
  Administered 2018-02-04: 5 mL

## 2018-01-26 NOTE — Progress Notes (Signed)
Patient ID: [name] Chung, female   DOB: 1933/04/06, 84 y.o.   MRN: [id number]   INR 1.9 today! 1.7 4/23  Must be wnl to perform abscess drain placement safely Had discussion with daughter in law yesterday Procedure rescheduled to 4/25-- tentatively   Will recheck in am per Dr Barbie Banner

## 2018-01-26 NOTE — Sedation Documentation (Signed)
Patient is resting comfortably. 

## 2018-01-26 NOTE — Progress Notes (Signed)
ANTICOAGULATION CONSULT NOTE - Follow Up Consult  Pharmacy Consult for heparin Indication: Afib and AVR/MVR  Labs: Recent Labs    01/24/18 0412  01/25/18 0502 01/25/18 0505 01/25/18 1840 01/26/18 0419  HGB 8.9*  --  8.8*  --   --  8.9*  HCT 25.8*  --  26.5*  --   --  26.3*  PLT 212  --  207  --   --  195  LABPROT 23.3*  --  20.6*  --   --  22.0*  INR 2.09  --  1.78  --   --  1.94  HEPARINUNFRC  --    < >  --  <0.10* <0.10* 0.14*  CREATININE 0.99  --  0.94  --   --  0.94   < > = values in this interval not displayed.    Assessment: 84yo female remains subtherapeutic on heparin after rate increases though level now increasing; INR also up; no signs of bleeding per RN.   Goal of Therapy:  Heparin level 0.3-0.7 units/ml Monitor platelets by anticoagulation protocol: Yes   Plan:  Will increase heparin gtt by 3 units/kg/hr to 1800 units/hr and check level in 8 hours.   Wynona Neat, PharmD, BCPS  01/26/2018,6:39 AM

## 2018-01-26 NOTE — Progress Notes (Signed)
PHARMACY - ADULT TOTAL PARENTERAL NUTRITION CONSULT NOTE   Pharmacy Consult:  TPN Indication:  Prolonged ileus / Prolonged NPO status  Patient Measurements: Height: 5' 5.5" (166.4 cm) Weight: 140 lb (63.5 kg) IBW/kg (Calculated) : 58.15 TPN AdjBW (KG): 64 Body mass index is 22.94 kg/m.   Assessment:  27 YOF presented on 01/27/2018 with N/V/D and abdominal pain, found to have pSBO.  She was placed on a clear liquid diet on 01/21/18, which her caregiver reported that she didn't eat much.  01/22/18 CT concerning for perforated appendicitis and patient may need surgery; therefore, she was made NPO again.  Today is day #5 of essentially NPO status and MD suspects patient will not be able to eat adequately within the next week.  Pharmacy consulted to initiate TPN.   GI: cirrhosis with portal venous HTN.  LBM 4/17 - rifaximin, PRN Zofran, tolerated first day of TPN. Prealbumin very low Endo: no hx DM, cbgs 130-200s Insulin requirements in the past 24 hours: 8 units ssi Lytes: CoCa wnl, Na 146, K wnl, Mag 1.9 (goal >/= 2 for ileus) Renal: SCr 0.9, UOP 1.2 ml/kg/hr Pulm: 2 L [place] Cards: Rheumatic heart dz - BP controlled, tachy (in Afib) - digoxin, Lasix IV, IV metoprolol AC: Coumadin PTA for Afib / mech valves >> INR 1.98 -rec'd Vit K today, also on heparin peri-operatively - hgb 8.9, plts WNL Hepatobil: LFTs WNL, tbili 1.4 Neuro: trazodone, PRN morphine - pain score 7 ID: Zosyn for appendiceal abscess/enteritis - afebrile, WBC 20 TPN Access: PICC placed 01/23/18 TPN start date: 01/23/18   Nutritional Goals (per RD 4/22): 1365-1575 kCal and 81-93 g protein per day  Current Nutrition:  NPO  Plan:  -Increase TPN to 65 ml/hr -TPN will provide 84 g AA, 250 g CHO and 29 g ILE, a total of 1560 kCal, meeting ~100% of patients needs -Electrolytes in TPN: reduce Na, other lytes to standard, YI:RSWNIOE 1:1 -Daily multivitamin and trace elements in TPN -Sensitive SSI Q6H     Harvel Quale 01/26/2018 10:14 AM

## 2018-01-26 NOTE — Progress Notes (Signed)
Progress Note  Patient Name: [name] Chung Date of Encounter: 01/26/2018  Primary Cardiologist: Kahlil Cowans [place], MD  Subjective   Laying bed,  weak,  Wants something to drink. Complains of abdominal pain.  Inpatient Medications    Scheduled Meds: . digoxin  0.0625 mg Oral Daily  . furosemide  20 mg Intravenous BID  . insulin aspart  0-9 Units Subcutaneous Q6H  . mouth rinse  15 mL Mouth Rinse BID  . metoprolol tartrate  2.5 mg Intravenous Q8H  . rifaximin  550 mg Oral BID  . sodium chloride flush  10-40 mL Intracatheter Q12H  . traZODone  100 mg Oral QHS   Continuous Infusions: . sodium chloride    . heparin Stopped (01/26/18 0858)  . phytonadione (VITAMIN K) IV    . piperacillin-tazobactam (ZOSYN)  IV 3.375 g (01/26/18 0620)  . TPN ADULT (ION) 50 mL/hr at 01/25/18 2038   PRN Meds: acetaminophen **OR** acetaminophen, hydrALAZINE, morphine injection, ondansetron (ZOFRAN) IV, sodium chloride flush, zolpidem   Vital Signs    Vitals:   01/26/18 0524 01/26/18 0620 01/26/18 0737 01/26/18 0847  BP: (!) 109/43  (!) 120/56   Pulse: 95  96 90  Resp: 13  14   Temp:   99.5 F (37.5 C)   TempSrc:   Oral   SpO2: 94%  94%   Weight:  140 lb (63.5 kg)    Height:        Intake/Output Summary (Last 24 hours) at 01/26/2018 6301 Last data filed at 01/26/2018 6010 Gross per 24 hour  Intake 404.2 ml  Output 1400 ml  Net -995.8 ml   Filed Weights   01/24/18 0413 01/25/18 0300 01/26/18 0620  Weight: 128 lb (58.1 kg) 145 lb (65.8 kg) 140 lb (63.5 kg)    Telemetry    AFib Rate 80-100 - Personally Reviewed  ECG    N/a  - Personally Reviewed  Physical Exam   General: Thin frail older W female appearing ill Head: Normocephalic, atraumatic.  Neck: Supple, no JVD. Lungs:  Resp regular and unlabored, Clear anteriorly. Heart: Irreg Irreg, S1, S2, soft systolic murmur with good mechanical valve AV and MV clicks; no rub. Abdomen: Soft, normoactive bowel sounds. Extremities: No  clubbing, cyanosis, edema.  Neuro: Alert and oriented X 3. Moves all extremities spontaneously. Psych: Normal affect.  Labs    Chemistry Recent Labs  Lab 01/27/2018 1544  01/21/18 0831  01/24/18 0412 01/25/18 0502 01/26/18 0419  NA 128*   < > 134*   < > 134* 136 146*  K 3.6   < > 3.3*   < > 2.8* 3.1* 4.2  CL 92*   < > 98*   < > 98* 100* 102  CO2 24   < > 26   < > 21* 30 40*  GLUCOSE 141*   < > 97   < > 138* 152* 144*  BUN 27*   < > 30*   < > 18 18 19   CREATININE 1.88*   < > 1.35*   < > 0.99 0.94 0.94  CALCIUM 8.2*   < > 8.2*   < > 7.3* 7.7* 7.7*  PROT 6.7  --  6.4*  --  5.1*  --   --   ALBUMIN 2.5*  --  2.2*  --  1.7*  --  1.6*  AST 40  --  37  --  32  --   --   ALT 17  --  16  --  13*  --   --   ALKPHOS 69  --  68  --  72  --   --   BILITOT 1.5*  --  1.8*  --  1.4*  --   --   GFRNONAA 24*   < > 35*   < > 51* 55* 55*  GFRAA 27*   < > 41*   < > 59* >60 >60  ANIONGAP 12   < > 10   < > 15 6 4*   < > = values in this interval not displayed.     Hematology Recent Labs  Lab 01/24/18 0412 01/25/18 0502 01/26/18 0419  WBC 20.4* 22.0* 27.0*  RBC 2.49* 2.51* 2.47*  HGB 8.9* 8.8* 8.9*  HCT 25.8* 26.5* 26.3*  MCV 103.6* 105.6* 106.5*  MCH 35.7* 35.1* 36.0*  MCHC 34.5 33.2 33.8  RDW 14.2 14.1 14.5  PLT 212 207 195    Cardiac EnzymesNo results for input(s): TROPONINI in the last 168 hours. No results for input(s): TROPIPOC in the last 168 hours.   BNP Recent Labs  Lab 01/04/2018 2240  BNP 159.9*     DDimer No results for input(s): DDIMER in the last 168 hours.    Radiology    No results found.  Cardiac Studies   N/a   Patient Profile     84 y.o. female with a history ofrheumatic valvular heart disease status post redo aortic valve replacement x3 and redo mitral valve surgery x2, persistent atrial fibrillation, coumadin for anticoagulation, chronic LE edema (multifactorial with diastolic CHF, venous insufficiency, cirrhosis)now admitted with SBO. We are  assisting with management of diastolic heart failure and atrial fibrillation.   Assessment & Plan    1. Rheumatic heart disease s/p AVR x3 and MVR x2: Has a [employer] mechanical mitral valve and [employer] freestyle aortic root conduit.  -- coumadin has been held for procedures with  heparin bridge.  INR down up a little to 1.9 today. Plan for IR drainage of abscess in today. Will be given FFP and additional Vit K today.  Continue IV heparin for now.   2. Abd pain 2/2 to perforated appendicitis with abscess: CT scan with concern for perforated appendicitis, surgery following with plans for drain placement. Concern with ongoing pain and WBC rising.   3. Chronic Afib: on IV lopressor and home dose Dig with good rate control. Rate controlled. Now on heparin bridge with coumadin held.   4. Acute on Chronic diastolic HF: Last echo from 11/18 with normal EF and no WMA. CXR on admission with edema and right sided pleural effusion. Remains on IV lasix. Net - 4 L since admit.  Cr stable.   5. Hypokalemia: 4.2 this morning.   Signed, Valetta Mulroy [place], MD  01/26/2018, 9:37 AM   For questions or updates, please contact [place] Please consult [email or web address] for contact info under Cardiology/STEMI.

## 2018-01-26 NOTE — Procedures (Signed)
RLQ abscess 10 Fr drain Pus EBL 0 Comp 0

## 2018-01-26 NOTE — Progress Notes (Addendum)
  Subjective: Still having a lot of pain. Daughter at bedside and distressed as well. WBC remains elevated at 27,000 INR 1.9 and radiology has tentatively rescheduled drainage procedure for tomorrow, but this is inappropriate given her clinical condition this morning.  I called Dr. Bartholome Bill in radiology and we have changed the plan to a drainage procedure today.    We are going to give 2 units of FFP and 2.5 mg IV vitamin K stat     He will arrange for drainage procedure and management of heparin drip  Objective: Vital signs in last 24 hours: Temp:  [99.5 F (37.5 C)] 99.5 F (37.5 C) (04/24 0737) Pulse Rate:  [94-106] 96 (04/24 0737) Resp:  [13-17] 14 (04/24 0737) BP: (109-138)/(43-88) 120/56 (04/24 0737) SpO2:  [87 %-97 %] 94 % (04/24 0737) Weight:  [63.5 kg (140 lb)] 63.5 kg (140 lb) (04/24 0620) Last BM Date: 01/10/2018  Intake/Output from previous day: 04/23 0701 - 04/24 0700 In: 404.2 [I.V.:354.2; IV Piggyback:50] Out: 1400 [Urine:1400] Intake/Output this shift: No intake/output data recorded.  General appearance: Alert.  Moderately severe distress. Resp: clear to auscultation bilaterally GI: Tender with guarding across lower abdomen  Lab Results:  Recent Labs    01/25/18 0502 01/26/18 0419  WBC 22.0* 27.0*  HGB 8.8* 8.9*  HCT 26.5* 26.3*  PLT 207 195   BMET Recent Labs    01/25/18 0502 01/26/18 0419  NA 136 146*  K 3.1* 4.2  CL 100* 102  CO2 30 40*  GLUCOSE 152* 144*  BUN 18 19  CREATININE 0.94 0.94  CALCIUM 7.7* 7.7*   PT/INR Recent Labs    01/25/18 0502 01/26/18 0419  LABPROT 20.6* 22.0*  INR 1.78 1.94   ABG No results for input(s): PHART, HCO3 in the last 72 hours.  Invalid input(s): PCO2, PO2  Studies/Results: No results found.  Anti-infectives: Anti-infectives (From admission, onward)   Start     Dose/Rate Route Frequency Ordered Stop   01/22/18 1600  piperacillin-tazobactam (ZOSYN) IVPB 3.375 g     3.375 g 12.5 mL/hr over 240  Minutes Intravenous Every 8 hours 01/22/18 1014     01/20/18 1500  piperacillin-tazobactam (ZOSYN) IVPB 2.25 g  Status:  Discontinued     2.25 g 100 mL/hr over 30 Minutes Intravenous Every 6 hours 01/20/18 1211 01/22/18 1014   01/20/18 0500  piperacillin-tazobactam (ZOSYN) IVPB 2.25 g  Status:  Discontinued     2.25 g 100 mL/hr over 30 Minutes Intravenous Every 6 hours 01/31/2018 2220 01/20/18 1211   01/04/2018 2330  rifaximin (XIFAXAN) tablet 550 mg     550 mg Oral 2 times daily 01/21/2018 2224     01/20/2018 2230  piperacillin-tazobactam (ZOSYN) IVPB 3.375 g     3.375 g 100 mL/hr over 30 Minutes Intravenous  Once 01/17/2018 2220 01/09/2018 2300      Assessment/Plan:  Perforated appendicitis with abscess versus neoplasia.  Radiology favors appendicitis. Significant leukocytosis IV antibiotics - zosyn  Status post MVR and AVR on Coumadin Currently on heparin drip with INR 1.9  Proceed with 2 units FFP and vitamin K stat Proceed with drainage procedure today.  Discussed with Dr. Maryclare Bean who agrees with plan for drainage today.  If she does not respond to this then she will need laparotomy which will be very high risk due to her cardiac disease, atrial fibrillation, long-term Coumadin use, cirrhosis, portal hypertension, chronic diastolic heart failure( ?normal EF)   LOS: 7 days    [name] Chung 01/26/2018

## 2018-01-26 NOTE — Progress Notes (Addendum)
PROGRESS NOTE  [name] Chung OMV:[id number] DOB: 09-06-1933 DOA: 01/04/2018 PCP: Lajean Manes, MD  HPI/Recap of past 71 hours: 84 year old married female, lives at [employer] in an apartment with her spouse who has dementia, independent, PMH of chronic A. fib, mechanical AVR & MVR on Coumadin, chronic diastolic CHF (Dr. Martinique, Cardiology), stage III chronic kidney disease, chronic lower extremity edema, hysterectomy presented to ED 01/13/2018 with 3 days history of nausea, nonbloody emesis, abdominal distention and pain for which she was seen by PCP 2 days PTA and started on Cipro for presumed UTI but UA returned negative so Cipro was stopped and also reported diarrhea.  PCP started Imodium with improvement in diarrhea.  In ED, initial noncontrasted CT abdomen and pelvis suggestive of PSBO.  General surgery was consulted and patient did not improve with conservative management for SBO.  After renal insufficiency improved, obtained CT abdomen and pelvis with contrast on 4/20 which is suggestive of perforated appendix with abscess.  Reversed INR with vitamin K.  IR consulted for CT-guided percutaneous drainage of abscess, but this has been deferred to 01/26/2018 due to elevated INR. INR is 1.78 today, 01/25/2018.  Cardiology assisting for decompensated CHF, A. fib with RVR.  01/26/18: Reports abdominal pain. General surgery arranged for abdominal drain today. Will receive FFP and vitamin K prior to this.  Assessment/Plan: Principal Problem:   Partial small bowel obstruction (HCC) Active Problems:   Chronic atrial fibrillation (HCC)   Long term current use of anticoagulant therapy   S/P MVR (mitral valve replacement)   S/P AVR (aortic valve replacement)   Acute renal failure superimposed on stage 3 chronic kidney disease (HCC)   Atrial fibrillation with RVR (HCC)   Chronic systolic (congestive) heart failure (HCC)   Hyponatremia   Sepsis (HCC)   Liver cirrhosis (HCC)  Complex appendiceal  abscess Worsening leukocytosis General surgery following. Drainage by IR arranged by general surgery today FFP and vitamin K prior to procedure Repeat INR in the morning  Chronic A-fib/MVR/AVR Rate controlled Holding anticoagulation for planned procedure Was on heparin drip while coumadin was being held. Continue close monitoring on telemetry  Cirrhosis with portal hypertension Follow up with GI No overt sign of bleeding  Anemia of chronic disease Suspect 2/2 to advanced liver disease Hg stable at 8 No sign of overt bleeding Repeat CBC in the am  Severe protein calorie malnutrition Albumin 1.6 Pharmacy consulted for TPN  Code Status: Full  Family Communication: Daughter at bedside    Consultants:  General surgery  Cardiology  IR  Pharmacy  Procedures:  Possible appendiceal abscess drain tube placement today by IR  Antimicrobials:  IV zosyn  DVT prophylaxis:  SCDs   Objective: Vitals:   01/26/18 0737 01/26/18 0847 01/26/18 1059 01/26/18 1136  BP: (!) 120/56  (!) 115/53 (!) 121/54  Pulse: 96 90 97   Resp: 14  14   Temp: 99.5 F (37.5 C)  98.4 F (36.9 C) 99 F (37.2 C)  TempSrc: Oral  Oral Oral  SpO2: 94%  96%   Weight:      Height:        Intake/Output Summary (Last 24 hours) at 01/26/2018 1224 Last data filed at 01/26/2018 6283 Gross per 24 hour  Intake 404.2 ml  Output 1000 ml  Net -595.8 ml   Filed Weights   01/24/18 0413 01/25/18 0300 01/26/18 0620  Weight: 58.1 kg (128 lb) 65.8 kg (145 lb) 63.5 kg (140 lb)    Exam:  General:  84 yo CF frail NAD  Cardiovascular: IRR no rubs or gallops. NO JVD or thyromegaly  Respiratory: CTA no wheezes or rales.   Abdomen: Diffused tenderness. NBS  Musculoskeletal: Trace edema in LE b/l   Skin: NO ulcerative lesions or rashes  Psychiatry: Mood is anxious.   Data Reviewed: CBC: Recent Labs  Lab 01/22/18 0601 01/23/18 1601 01/24/18 0412 01/25/18 0502 01/26/18 0419  WBC 21.3*  22.2* 20.4* 22.0* 27.0*  NEUTROABS  --   --  15.7*  --  21.9*  HGB 8.7* 9.1* 8.9* 8.8* 8.9*  HCT 25.4* 26.3* 25.8* 26.5* 26.3*  MCV 102.8* 102.7* 103.6* 105.6* 106.5*  PLT 176 195 212 207 093   Basic Metabolic Panel: Recent Labs  Lab 01/22/18 0601 01/23/18 0642 01/24/18 0412 01/25/18 0502 01/26/18 0419  NA 131* 130* 134* 136 146*  K 3.4* 3.8 2.8* 3.1* 4.2  CL 98* 99* 98* 100* 102  CO2 24 23 21* 30 40*  GLUCOSE 111* 112* 138* 152* 144*  BUN 29* 23* 18 18 19   CREATININE 1.29* 1.12* 0.99 0.94 0.94  CALCIUM 7.7* 7.6* 7.3* 7.7* 7.7*  MG 1.6* 1.9 1.7 1.9  --   PHOS  --  2.5 3.0  --  2.2*   GFR: Estimated Creatinine Clearance: 41.7 mL/min (by C-G formula based on SCr of 0.94 mg/dL). Liver Function Tests: Recent Labs  Lab 01/24/2018 1544 01/21/18 0831 01/24/18 0412 01/26/18 0419  AST 40 37 32  --   ALT 17 16 13*  --   ALKPHOS 69 68 72  --   BILITOT 1.5* 1.8* 1.4*  --   PROT 6.7 6.4* 5.1*  --   ALBUMIN 2.5* 2.2* 1.7* 1.6*   Recent Labs  Lab 01/17/2018 1544  LIPASE 46   Recent Labs  Lab 01/28/2018 2240  AMMONIA 17   Coagulation Profile: Recent Labs  Lab 01/23/18 0642 01/23/18 2019 01/24/18 0412 01/25/18 0502 01/26/18 0419  INR 8.98* 2.69 2.09 1.78 1.94   Cardiac Enzymes: No results for input(s): CKTOTAL, CKMB, CKMBINDEX, TROPONINI in the last 168 hours. BNP (last 3 results) No results for input(s): PROBNP in the last 8760 hours. HbA1C: No results for input(s): HGBA1C in the last 72 hours. CBG: Recent Labs  Lab 01/25/18 1241 01/25/18 1729 01/26/18 0016 01/26/18 0557 01/26/18 0758  GLUCAP 205* 133* 163* 197* 146*   Lipid Profile: Recent Labs    01/24/18 0412  TRIG 79   Thyroid Function Tests: No results for input(s): TSH, T4TOTAL, FREET4, T3FREE, THYROIDAB in the last 72 hours. Anemia Panel: No results for input(s): VITAMINB12, FOLATE, FERRITIN, TIBC, IRON, RETICCTPCT in the last 72 hours. Urine analysis:    Component Value Date/Time    COLORURINE YELLOW 01/17/2018 1538   APPEARANCEUR CLEAR 01/09/2018 1538   APPEARANCEUR Clear 08/17/2017 1457   LABSPEC 1.012 01/27/2018 1538   PHURINE 5.0 01/13/2018 1538   GLUCOSEU NEGATIVE 01/29/2018 1538   HGBUR NEGATIVE 01/04/2018 1538   BILIRUBINUR NEGATIVE 01/03/2018 1538   BILIRUBINUR Negative 08/17/2017 1457   KETONESUR NEGATIVE 01/20/2018 1538   PROTEINUR NEGATIVE 01/16/2018 1538   NITRITE NEGATIVE 01/10/2018 1538   LEUKOCYTESUR TRACE (A) 01/10/2018 1538   LEUKOCYTESUR Negative 08/17/2017 1457   Sepsis Labs: @LABRCNTIP (procalcitonin:4,lacticidven:4)  ) Recent Results (from the past 240 hour(s))  Urine Culture     Status: Abnormal   Collection Time: 01/10/2018 10:19 PM  Result Value Ref Range Status   Specimen Description URINE, RANDOM  Final   Special Requests NONE  Final  Culture (A)  Final    <10,000 COLONIES/mL INSIGNIFICANT GROWTH Performed at [facility] [address], [place], [place] [address]    Report Status 01/21/2018 FINAL  Final  Culture, blood (x 2)     Status: None   Collection Time: 01/29/2018 10:46 PM  Result Value Ref Range Status   Specimen Description BLOOD RIGHT ANTECUBITAL  Final   Special Requests   Final    BOTTLES DRAWN AEROBIC AND ANAEROBIC Blood Culture adequate volume   Culture   Final    NO GROWTH 5 DAYS Performed at [facility], [place] [address], [place], [place] [address]    Report Status 01/25/2018 FINAL  Final  Culture, blood (Routine X 2) w Reflex to ID Panel     Status: None   Collection Time: 01/20/18 12:24 PM  Result Value Ref Range Status   Specimen Description BLOOD RIGHT HAND  Final   Special Requests   Final    BOTTLES DRAWN AEROBIC AND ANAEROBIC Blood Culture adequate volume   Culture   Final    NO GROWTH 5 DAYS Performed at [facility], [place] [address] [place], [place] [address]    Report Status 01/25/2018 FINAL  Final  Culture, blood (Routine X 2) w Reflex to ID Panel     Status: None    Collection Time: 01/20/18  8:24 PM  Result Value Ref Range Status   Specimen Description BLOOD RIGHT ARM  Final   Special Requests   Final    BOTTLES DRAWN AEROBIC ONLY Blood Culture adequate volume   Culture   Final    NO GROWTH 5 DAYS Performed at [facility], [place] [address] [place], [place] [address]    Report Status 01/25/2018 FINAL  Final  MRSA PCR Screening     Status: None   Collection Time: 01/23/18  5:18 AM  Result Value Ref Range Status   MRSA by PCR NEGATIVE NEGATIVE Final    Comment:        The GeneXpert MRSA Assay (FDA approved for NASAL specimens only), is one component of a comprehensive MRSA colonization surveillance program. It is not intended to diagnose MRSA infection nor to guide or monitor treatment for MRSA infections. Performed at [facility], [place] [address] [place], [place] [address]       Studies: No results found.  Scheduled Meds: . digoxin  0.0625 mg Oral Daily  . furosemide  20 mg Intravenous BID  . insulin aspart  0-9 Units Subcutaneous Q6H  . mouth rinse  15 mL Mouth Rinse BID  . metoprolol tartrate  2.5 mg Intravenous Q8H  . rifaximin  550 mg Oral BID  . sodium chloride flush  10-40 mL Intracatheter Q12H  . traZODone  100 mg Oral QHS    Continuous Infusions: . heparin Stopped (01/26/18 0858)  . piperacillin-tazobactam (ZOSYN)  IV Stopped (01/26/18 1015)  . TPN ADULT (ION) 50 mL/hr at 01/25/18 2038  . TPN ADULT (ION)       LOS: 7 days     Kayleen Memos, MD [facility] Pager [phone]  If 7PM-7AM, please contact night-coverage [email or web address] Password [facility] 01/26/2018, 12:24 PM

## 2018-01-26 NOTE — Progress Notes (Signed)
ANTICOAGULATION CONSULT NOTE - Follow Up Consult  Pharmacy Consult for Heparin Indication: afib and mechanical valve  Allergies  Allergen Reactions  . Asa Buff (Mag [Buffered Aspirin] Other (See Comments)    On coumadin  . Codeine   . Mirtazapine Nausea Only  . Ultram [Tramadol Hcl]     Patient Measurements: Height: 5' 5.5" (166.4 cm) Weight: 140 lb (63.5 kg) IBW/kg (Calculated) : 58.15 Heparin Dosing Weight: 63.5 kg  Vital Signs: Temp: 98.3 F (36.8 C) (04/24 1730) Temp Source: Oral (04/24 1730) BP: 131/53 (04/24 1730) Pulse Rate: 94 (04/24 1730)  Labs: Recent Labs    01/24/18 0412  01/25/18 0502  01/25/18 1840 01/26/18 0419 01/26/18 1412  HGB 8.9*  --  8.8*  --   --  8.9*  --   HCT 25.8*  --  26.5*  --   --  26.3*  --   PLT 212  --  207  --   --  195  --   LABPROT 23.3*  --  20.6*  --   --  22.0*  --   INR 2.09  --  1.78  --   --  1.94  --   HEPARINUNFRC  --    < >  --    < > <0.10* 0.14* <0.10*  CREATININE 0.99  --  0.94  --   --  0.94  --    < > = values in this interval not displayed.    Estimated Creatinine Clearance: 41.7 mL/min (by C-G formula based on SCr of 0.94 mg/dL).  Assessment:  Anticoag: warfarin PTA for afib and mechanical valve Heparin started as bridge- levels have been undetectable/low, most recently 0.14 this AM. INR trended up to 1.9- surgery giving 2 units FFP stat on floor in order for patient to go to IR on 4/24. S/p IR, hospitalist on call ok to resume IV heparin  Goal of Therapy:  Heparin level 0.3-0.7 units/ml Monitor platelets by anticoagulation protocol: Yes   Plan:  Resume IV heparin at 1800 units/hr Check heparin level and CBC with AM labs.  [name] Chung, PharmD, BCPS Clinical Staff Pharmacist Pager [phone]  [name] Chung 01/26/2018,7:35 PM

## 2018-01-26 NOTE — Progress Notes (Signed)
Nutrition Follow-Up  DOCUMENTATION CODES:   Not applicable  INTERVENTION:   TPN Per Pharmacy  Recommend re-check phosphorus  Recommend '100mg'$  thiamine mixed into TPN bag daily for risk of refeeding.  NUTRITION DIAGNOSIS:   Inadequate oral intake related to inability to eat, acute illness, nausea, vomiting as evidenced by NPO status, energy intake < or equal to 50% for > or equal to 5 days. -ongoing  GOAL:   Patient will meet greater than or equal to 90% of their needs  -met with TPN at goal  MONITOR:   I & O's, Labs, Skin, Weight trends  ASSESSMENT:   Ms. Maslanka is an 84 yo female with PMH dementia, chronic A-fib, mechanical AVR & MVR replacement on coumadin, chronic diastolic CHF, Stg III CKD presented to ED 01/09/2018 with 3 days of nausea, nonbloody emesis, abdominal distension, and pain. She was seen by PCP started on cipro for presumed UTI. UA returned negative, so Cipro was stopped, patient also reported diarrhea. CT abdomen 4/20 suggested perforated appendicular abscess and ileus.  Surgery ordered PICC Line and TPN. Currently, vitamin K is being used to reverse INR so IR can place percutaneous drain of abscess.  Checked on Ms. Trafton today, she was asking for prime rib. No complaints from sitter or patient. Tolerating TPN CT guided perc drain of appendical abscess to be placed today   Intake/Output Summary (Last 24 hours) at 01/26/2018 1635 Last data filed at 01/26/2018 1427 Gross per 24 hour  Intake 2521.73 ml  Output 1500 ml  Net 1021.73 ml  2.5L Fluid negative 1490m UOP last 24 hrs  Labs reviewed:  CBGs 157, 146, 197, 163 Na 146, Phos 2.2  Medications reviewed and include:  Insulin  Diet Order:  TPN ADULT (ION) Diet NPO time specified Except for: Sips with Meds Diet NPO time specified Except for: Sips with Meds TPN ADULT (ION)  EDUCATION NEEDS:   Not appropriate for education at this time  Skin:  Skin Assessment: Reviewed RN Assessment(abrasion to L  foot)  Last BM:  01/18/2018  Height:   Ht Readings from Last 1 Encounters:  01/20/18 5' 5.5" (1.664 m)    Weight:   Wt Readings from Last 1 Encounters:  01/26/18 140 lb (63.5 kg)    Ideal Body Weight:  58 kg  BMI:  Body mass index is 22.94 kg/m.  Estimated Nutritional Needs:   Kcal:  19290-9030calories (MSJ x1.3-1.5)  Protein:  81-93 grams (1.4-1.6g/kg)  Fluid:  1.5-1.6L  WSatira Anis [place] Dube, MS, RD LDN Inpatient Clinical Dietitian Pager [phone]

## 2018-01-27 DIAGNOSIS — R1310 Dysphagia, unspecified: Secondary | ICD-10-CM

## 2018-01-27 LAB — PREPARE FRESH FROZEN PLASMA
UNIT DIVISION: 0
Unit division: 0

## 2018-01-27 LAB — COMPREHENSIVE METABOLIC PANEL
ALK PHOS: 70 U/L (ref 38–126)
ALT: 12 U/L — ABNORMAL LOW (ref 14–54)
ANION GAP: 8 (ref 5–15)
AST: 30 U/L (ref 15–41)
Albumin: 1.9 g/dL — ABNORMAL LOW (ref 3.5–5.0)
BILIRUBIN TOTAL: 1.2 mg/dL (ref 0.3–1.2)
BUN: 23 mg/dL — ABNORMAL HIGH (ref 6–20)
CALCIUM: 7.9 mg/dL — AB (ref 8.9–10.3)
CO2: 29 mmol/L (ref 22–32)
Chloride: 99 mmol/L — ABNORMAL LOW (ref 101–111)
Creatinine, Ser: 0.92 mg/dL (ref 0.44–1.00)
GFR, EST NON AFRICAN AMERICAN: 56 mL/min — AB (ref >60)
Glucose, Bld: 177 mg/dL — ABNORMAL HIGH (ref 65–99)
Potassium: 3.9 mmol/L (ref 3.5–5.1)
Sodium: 136 mmol/L (ref 135–145)
TOTAL PROTEIN: 5.6 g/dL — AB (ref 6.5–8.1)

## 2018-01-27 LAB — BPAM FFP
BLOOD PRODUCT EXPIRATION DATE: 201904292359
Blood Product Expiration Date: 201904292359
ISSUE DATE / TIME: 201904241400
ISSUE DATE / TIME: 201904241113
Unit Type and Rh: 600
Unit Type and Rh: 6200

## 2018-01-27 LAB — CBC
HEMATOCRIT: 24 % — AB (ref 36.0–46.0)
HEMOGLOBIN: 8 g/dL — AB (ref 12.0–15.0)
MCH: 35.9 pg — AB (ref 26.0–34.0)
MCHC: 33.3 g/dL (ref 30.0–36.0)
MCV: 107.6 fL — ABNORMAL HIGH (ref 78.0–100.0)
Platelets: 158 10*3/uL (ref 150–400)
RBC: 2.23 MIL/uL — ABNORMAL LOW (ref 3.87–5.11)
RDW: 15 % (ref 11.5–15.5)
WBC: 22.8 10*3/uL — ABNORMAL HIGH (ref 4.0–10.5)

## 2018-01-27 LAB — HEPARIN LEVEL (UNFRACTIONATED)
HEPARIN UNFRACTIONATED: 0.14 [IU]/mL — AB (ref 0.30–0.70)
Heparin Unfractionated: 0.24 IU/mL — ABNORMAL LOW (ref 0.30–0.70)
Heparin Unfractionated: 0.27 IU/mL — ABNORMAL LOW (ref 0.30–0.70)

## 2018-01-27 LAB — PROTIME-INR
INR: 1.53
PROTHROMBIN TIME: 18.3 s — AB (ref 11.4–15.2)

## 2018-01-27 LAB — MAGNESIUM: Magnesium: 1.9 mg/dL (ref 1.7–2.4)

## 2018-01-27 LAB — GLUCOSE, CAPILLARY
GLUCOSE-CAPILLARY: 150 mg/dL — AB (ref 65–99)
GLUCOSE-CAPILLARY: 151 mg/dL — AB (ref 65–99)
Glucose-Capillary: 160 mg/dL — ABNORMAL HIGH (ref 65–99)
Glucose-Capillary: 180 mg/dL — ABNORMAL HIGH (ref 65–99)

## 2018-01-27 LAB — PHOSPHORUS: PHOSPHORUS: 3 mg/dL (ref 2.5–4.6)

## 2018-01-27 MED ORDER — MAGNESIUM SULFATE 2 GM/50ML IV SOLN
2.0000 g | Freq: Once | INTRAVENOUS | Status: AC
  Administered 2018-01-27: 2 g via INTRAVENOUS
  Filled 2018-01-27: qty 50

## 2018-01-27 MED ORDER — TRAVASOL 10 % IV SOLN
INTRAVENOUS | Status: AC
  Administered 2018-01-27: 17:00:00 via INTRAVENOUS
  Filled 2018-01-27: qty 842.4

## 2018-01-27 MED ORDER — BOOST / RESOURCE BREEZE PO LIQD CUSTOM
1.0000 | Freq: Two times a day (BID) | ORAL | Status: DC
  Administered 2018-01-29 – 2018-01-31 (×4): 1 via ORAL

## 2018-01-27 NOTE — Progress Notes (Addendum)
Inpatient Rehabilitation  Per PT & OT request, patient was screened by Gunnar Fusi for appropriateness for an Inpatient Acute Rehab consult.  At this time we are recommending an Inpatient Rehab consult.  Text paged attending to notify; please order if you are agreeable.    Carmelia Roller., CCC/SLP Admission Coordinator  [place]  Cell [phone]

## 2018-01-27 NOTE — Progress Notes (Signed)
ANTICOAGULATION CONSULT NOTE - Follow Up Consult  Pharmacy Consult for Heparin Indication: afib and mechanical valve  Allergies  Allergen Reactions  . Asa Buff (Mag [Buffered Aspirin] Other (See Comments)    On coumadin  . Codeine   . Mirtazapine Nausea Only  . Ultram [Tramadol Hcl]     Patient Measurements: Height: 5' 5.5" (166.4 cm) Weight: 140 lb (63.5 kg) IBW/kg (Calculated) : 58.15 Heparin Dosing Weight: 63.5 kg  Vital Signs: Temp: 98.5 F (36.9 C) (04/25 0103) Temp Source: Oral (04/25 0103) BP: 123/56 (04/25 0103) Pulse Rate: 110 (04/24 1939)  Labs: Recent Labs    01/25/18 0502  01/26/18 0419 01/26/18 1412 01/27/18 0234  HGB 8.8*  --  8.9*  --  8.0*  HCT 26.5*  --  26.3*  --  24.0*  PLT 207  --  195  --  158  LABPROT 20.6*  --  22.0*  --  18.3*  INR 1.78  --  1.94  --  1.53  HEPARINUNFRC  --    < > 0.14* <0.10* 0.24*  CREATININE 0.94  --  0.94  --  0.92   < > = values in this interval not displayed.    Estimated Creatinine Clearance: 42.6 mL/min (by C-G formula based on SCr of 0.92 mg/dL).  Assessment: 84 y.o. female with h/o Afib and AVR/MVR, for heparin  Goal of Therapy:  Heparin level 0.3-0.7 units/ml Monitor platelets by anticoagulation protocol: Yes   Plan:  Increase heparin 1900 units/hr Check heparin level in 8 hours.   Caryl Pina 01/27/2018,3:19 AM

## 2018-01-27 NOTE — Evaluation (Signed)
Physical Therapy Evaluation Patient Details Name: [name] Chung MRN: [id number] DOB: [birth year] Today's Date: 01/27/2018   History of Present Illness  84yo female presenting with nausea, vomiting, abdominal pain, diarrhea, abdominal distension. CT positive for partial SBO, also diagnosed with sepsis, abdominal drain placed. PMH OA, aortic insufficiency, A-fib, rheumatic heart disease, hx aortic valve replacement, hx mitral valve replacement   Clinical Impression   Patient received in bed, very pleasant and willing to participate in PT/OT evaluation today. She is able to complete functional bed mobility with MinA, as well as sit to stand with ModAx2 and RW, stand pivot with MinA x2 and RW. She does demonstrate gross instability with RW when standing and requires Min assist to maintain balance and safety. SpO2 dropped to 82% on room air, recovered to 90s when supplemental O2 was replaced per nasal cannula; HR variable and reached 121BPM at highest during session today. She was left up in the chair with all needs met and visitors/family present this morning. Due to independent PLOF as well as extensive conversation regarding rehab needs with family, strongly recommend aggressive therapies in the CIR setting to assist in return to optimal level of function.     Follow Up Recommendations CIR    Equipment Recommendations  Other (comment)(defer to next venue )    Recommendations for Other Services       Precautions / Restrictions Precautions Precautions: Fall;Other (comment) Precaution Comments: very sensitive feet (painful) Restrictions Weight Bearing Restrictions: No      Mobility  Bed Mobility Overal bed mobility: Needs Assistance Bed Mobility: Supine to Sit     Supine to sit: Min assist     General bed mobility comments: MinA to bring trunk up and steady at EOB, patient then able to maintain balance   Transfers Overall transfer level: Needs assistance Equipment used: Rolling  walker (2 wheeled) Transfers: Sit to/from [facility] Sit to Stand: Mod assist;+2 physical assistance;+2 safety/equipment Stand pivot transfers: Min assist;+2 physical assistance;+2 safety/equipment       General transfer comment: patient able to perform functional transfers with above level of assistance, mildly unsteady and requiring assist for balance   Ambulation/Gait             General Gait Details: unable but did take multiple pivotal steps around to chair during transfer   Stairs            Wheelchair Mobility    Modified Rankin (Stroke Patients Only)       Balance Overall balance assessment: Needs assistance Sitting-balance support: Bilateral upper extremity supported;Feet supported Sitting balance-Leahy Scale: Good     Standing balance support: Bilateral upper extremity supported;During functional activity Standing balance-Leahy Scale: Fair Standing balance comment: general unsteadiness, reliant on B UE support and external assist to steady                              Pertinent Vitals/Pain Pain Assessment: 0-10 Pain Score: 7  Pain Location: R side of abdomen  Pain Descriptors / Indicators: Sharp;Discomfort;Sore Pain Intervention(s): Limited activity within patient's tolerance;Monitored during session;Repositioned;Patient requesting pain meds-RN notified    Home Living Family/patient expects to be discharged to:: Assisted living               Home Equipment: None      Prior Function Level of Independence: Independent         Comments: at baseline patient very independent, drives, able to care for  herself     Hand Dominance        Extremity/Trunk Assessment   Upper Extremity Assessment Upper Extremity Assessment: Defer to OT evaluation    Lower Extremity Assessment Lower Extremity Assessment: Generalized weakness    Cervical / Trunk Assessment Cervical / Trunk Assessment: Kyphotic   Communication   Communication: No difficulties  Cognition Arousal/Alertness: Awake/alert Behavior During Therapy: WFL for tasks assessed/performed Overall Cognitive Status: Within Functional Limits for tasks assessed                                        General Comments General comments (skin integrity, edema, etc.): SpO2 dropped to 82% on room air sitting EOB, returned supplemental O2 per nasal cannula and it returned to 90s, HR variable and reached 121 BPM at highest. Left up in chair with VSS.     Exercises     Assessment/Plan    PT Assessment Patient needs continued PT services  PT Problem List Decreased strength;Decreased mobility;Decreased safety awareness;Decreased coordination;Decreased knowledge of precautions;Decreased activity tolerance;Decreased balance       PT Treatment Interventions DME instruction;Therapeutic activities;Gait training;Therapeutic exercise;Patient/family education;Stair training;Balance training;Functional mobility training;Neuromuscular re-education;Manual techniques    PT Goals (Current goals can be found in the Care Plan section)  Acute Rehab PT Goals Patient Stated Goal: to get well, return to PLOF  PT Goal Formulation: With patient/family Time For Goal Achievement: 02/10/18 Potential to Achieve Goals: Good    Frequency Min 3X/week   Barriers to discharge        Co-evaluation PT/OT/SLP Co-Evaluation/Treatment: Yes Reason for Co-Treatment: Complexity of the patient's impairments (multi-system involvement);For patient/therapist safety;To address functional/ADL transfers PT goals addressed during session: Mobility/safety with mobility;Proper use of DME         AM-PAC PT "6 Clicks" Daily Activity  Outcome Measure Difficulty turning over in bed (including adjusting bedclothes, sheets and blankets)?: Unable Difficulty moving from lying on back to sitting on the side of the bed? : Unable Difficulty sitting down on and  standing up from a chair with arms (e.g., wheelchair, bedside commode, etc,.)?: Unable Help needed moving to and from a bed to chair (including a wheelchair)?: A Little Help needed walking in hospital room?: A Lot Help needed climbing 3-5 steps with a railing? : Total 6 Click Score: 9    End of Session Equipment Utilized During Treatment: Gait belt;Oxygen Activity Tolerance: Patient limited by fatigue;Patient tolerated treatment well Patient left: in chair;with call bell/phone within reach;with family/visitor present Nurse Communication: Mobility status PT Visit Diagnosis: Unsteadiness on feet (R26.81);Muscle weakness (generalized) (M62.81);Difficulty in walking, not elsewhere classified (R26.2)    Time: 7034-0352 PT Time Calculation (min) (ACUTE ONLY): 30 min   Charges:   PT Evaluation $PT Eval Moderate Complexity: 1 Mod     PT G Codes:        Deniece Ree PT, DPT, CBIS  Supplemental Physical Therapist [place]   Pager [phone]

## 2018-01-27 NOTE — Progress Notes (Signed)
ANTICOAGULATION CONSULT NOTE - Follow Up Consult  Pharmacy Consult for Heparin (Coumadin on hold) Indication: atrial fibrillation and mechanical AVR & MVR  Patient Measurements: Height: 5' 5.5" (166.4 cm) Weight: 141 lb (64 kg) IBW/kg (Calculated) : 58.15 Heparin Dosing Weight: 58 kg  Vital Signs: Temp: 97.8 F (36.6 C) (04/25 0800) Temp Source: Oral (04/25 0800) BP: 142/54 (04/25 1414) Pulse Rate: 98 (04/25 1414)  Labs: Recent Labs    01/25/18 0502  01/26/18 0419 01/26/18 1412 01/27/18 0234 01/27/18 1316  HGB 8.8*  --  8.9*  --  8.0*  --   HCT 26.5*  --  26.3*  --  24.0*  --   PLT 207  --  195  --  158  --   LABPROT 20.6*  --  22.0*  --  18.3*  --   INR 1.78  --  1.94  --  1.53  --   HEPARINUNFRC  --    < > 0.14* <0.10* 0.24* 0.14*  CREATININE 0.94  --  0.94  --  0.92  --    < > = values in this interval not displayed.    Estimated Creatinine Clearance: 42.6 mL/min (by C-G formula based on SCr of 0.92 mg/dL).  Assessment:   84 yr old female on Coumadin prior to admission for mechanical AVR & MVR and atrial fibrillation.  Coumadin on hold for abdominal drain when INR down. Vitamin K given 4/21 but INR did not drop as much as expected. Drain placed on 4/24 after additional Vitamin K and FFP given.  INR down to 1.53 today.      Heparin drip resumed last night at 1800 units/hr and level up to 0.24 overnight. Rate increased to 1900 units/hr.  Next level only 0.14 ~1:15pm, but drip had been paused ~12n d/t swelling in left arm, site of peripheral IV where heparin was infusing.   Heparin now running via one of the lumens of PICC.  IV team to assess for possible placement of new peripheral IV.  Goal of Therapy:  Heparin level 0.3-0.7 units/ml Monitor platelets by anticoagulation protocol: Yes   Plan:   Continue heparin drip at 1900 units/hr  Will recheck heparin level later tonight.  Daily PT/INR.  Coumadin on hold.    Arty Baumgartner, [place] Pager:  [phone] 01/27/2018,3:14 PM

## 2018-01-27 NOTE — Plan of Care (Signed)

## 2018-01-27 NOTE — Progress Notes (Addendum)
PHARMACY - ADULT TOTAL PARENTERAL NUTRITION CONSULT NOTE   Pharmacy Consult:  TPN Indication:  Prolonged ileus  Patient Measurements: Height: 5' 5.5" (166.4 cm) Weight: 141 lb (64 kg) IBW/kg (Calculated) : 58.15 TPN AdjBW (KG): 64 Body mass index is 23.11 kg/m.   Assessment:  55 YOF presented on 01/07/2018 with N/V/D and abdominal pain, found to have pSBO.  She was placed on a clear liquid diet on 01/21/18, which her caregiver reported that she didn't eat much.  01/22/18 CT concerning for perforated appendicitis and patient may need surgery; therefore, she was made NPO again.  Pharmacy consulted to initiate TPN due to prolonged npo status.   GI: cirrhosis with portal venous HTN.  LBM 4/17 - rifaximin, PRN Zofran, tolerated first day of TPN. Prealbumin <5 Endo: no hx DM, cbgs 140-180s Insulin requirements in the past 24 hours: 6 units ssi Lytes: CoCa 9.5, Na 136, K 3.9, Mag 1.9 (goal K > 4, Mg > 2 for ileus) Renal: SCr 0.9, UOP 1.2 ml/kg/hr Pulm: 2 L [place] Cards: Rheumatic heart dz - BP controlled, previously tachy in Afib, now controlled on digoxin, Lasix, metoprolol AC: warfarin PTA for Afib / mech valves >> heparin gtt  INR 1.5 -rec'd Vit K yesterday,- hgb 8, plts WNL Hepatobil: LFTs WNL, tbili 1.2  Neuro: trazodone, PRN morphine ID: Zosyn for appendiceal abscess/enteritis - afebrile TPN Access: PICC placed 01/23/18 TPN start date: 01/23/18   Nutritional Goals (per RD 4/24): 1365-1575 kCal and 81-93 g protein per day, 1.5-1.6 L fluid/day  Current Nutrition:  NPO  Plan:  -Continue TPN at 65 ml/hr -TPN will provide 84 g AA, 250 g CHO and 37.5 g ILE, a total of 1560 kCal, meeting 100% of patients needs -Electrolytes in TPN: Increase K+, other lytes to standard -GH:[id number] 1:1 -Daily multivitamin, trace elements in TPN -Add thiamine 100 mg  -Sensitive SSI Q6H -Magnesium 1 g IV x1    Harvel Quale 01/27/2018 8:31 AM

## 2018-01-27 NOTE — Progress Notes (Signed)
ANTICOAGULATION CONSULT NOTE - Follow Up Consult  Pharmacy Consult for Heparin Indication: afib and mechanical valve  Allergies  Allergen Reactions  . Asa Buff (Mag [Buffered Aspirin] Other (See Comments)    On coumadin  . Codeine   . Mirtazapine Nausea Only  . Ultram [Tramadol Hcl]     Patient Measurements: Height: 5' 5.5" (166.4 cm) Weight: 141 lb (64 kg) IBW/kg (Calculated) : 58.15 Heparin Dosing Weight: 64 kg  Vital Signs: Temp: 98.4 F (36.9 C) (04/25 2029) Temp Source: Oral (04/25 1755) BP: 148/55 (04/25 1755) Pulse Rate: 97 (04/25 1755)  Labs: Recent Labs    01/25/18 0502  01/26/18 0419  01/27/18 0234 01/27/18 1316 01/27/18 2149  HGB 8.8*  --  8.9*  --  8.0*  --   --   HCT 26.5*  --  26.3*  --  24.0*  --   --   PLT 207  --  195  --  158  --   --   LABPROT 20.6*  --  22.0*  --  18.3*  --   --   INR 1.78  --  1.94  --  1.53  --   --   HEPARINUNFRC  --    < > 0.14*   < > 0.24* 0.14* 0.27*  CREATININE 0.94  --  0.94  --  0.92  --   --    < > = values in this interval not displayed.    Estimated Creatinine Clearance: 42.6 mL/min (by C-G formula based on SCr of 0.92 mg/dL).  Assessment: Anticoag: warfarin PTA for afib and mechanical valve. Heparin level low earlier today after being turned off for arm swelling. Then resumed at 1400. Heparin level this PM 0.27 almost in goal range.  Goal of Therapy:  Heparin level 0.3-0.7 units/ml Monitor platelets by anticoagulation protocol: Yes   Plan:  Increase IV heparin to 2000 units/hr Recheck heparin level and CBC in AM   [name] Chung, PharmD, BCPS Clinical Staff Pharmacist Pager [phone]  [name] Chung 01/27/2018,10:25 PM

## 2018-01-27 NOTE — Progress Notes (Signed)
Nutrition Follow-up  DOCUMENTATION CODES:   Not applicable  INTERVENTION:  Continue TPN per pharmacy  Encourage PO intake of liquids  Add Boost Breeze po BID, each supplement provides 250 kcal and 9 grams of protein  Can D/C Thiamine 100mg daily, patient's electrolytes WNL, CBGs acceptable, does not appear to be refeeding.  NUTRITION DIAGNOSIS:   Inadequate oral intake related to acute illness(ileus) as evidenced by patient report -new dx  GOAL:   Patient will meet greater than or equal to 90% of their needs -ongoing, met with TPN + clear liquids  MONITOR:   Diet advancement, PO intake, Supplement acceptance, Weight trends  ASSESSMENT:   [name] Chung is an 84 yo female with PMH dementia, chronic A-fib, mechanical AVR & MVR replacement on coumadin, chronic diastolic CHF, Stg III CKD presented to ED 01/10/2018 with 3 days of nausea, nonbloody emesis, abdominal distension, and pain. She was seen by PCP started on cipro for presumed UTI. UA returned negative, so Cipro was stopped, patient also reported diarrhea. CT abdomen 4/20 suggested perforated appendicular abscess and ileus.  Surgery ordered PICC Line and TPN. Currently, vitamin K is being used to reverse INR so IR can place percutaneous drain of abscess.  1 day s/p CT guided placement of perc drain for appendical abscess Spoke with patient at bedside, she is feeling much better. Abdomen continues to be firm, waiting for BM. Consumed 50% of clear liquids this morning. Open to trying boost breeze while on clears. No nausea or complaints currently. Monitor PO intake and for a BM.  Labs reviewed:  CBGs 180, 150, 160  Medications reviewed and include:  Insulin, Heparin gtt   Intake/Output Summary (Last 24 hours) at 01/27/2018 1323 Last data filed at 01/27/2018 1200 Gross per 24 hour  Intake 3566.43 ml  Output 2065 ml  Net 1501.43 ml  2.5L fluid negative 1550mL UOP last 24 hrs  Labs reviewed:  CBGs 180, 150,  160  Medications reviewed and include Heparin gtt, Insulin  Diet Order:  TPN ADULT (ION) TPN ADULT (ION) Diet clear liquid Room service appropriate? Yes; Fluid consistency: Thin  EDUCATION NEEDS:   Not appropriate for education at this time  Skin:  Skin Assessment: Reviewed RN Assessment(abrasion to L foot)  Last BM:  01/11/2018  Height:   Ht Readings from Last 1 Encounters:  01/20/18 5' 5.5" (1.664 m)    Weight:   Wt Readings from Last 1 Encounters:  01/27/18 141 lb (64 kg)    Ideal Body Weight:  58 kg  BMI:  Body mass index is 23.11 kg/m.  Estimated Nutritional Needs:   Kcal:  1365-1575 calories (MSJ x1.3-1.5)  Protein:  81-93 grams (1.4-1.6g/kg)  Fluid:  1.5-1.6L  William M. Ward, MS, RD LDN Inpatient Clinical Dietitian Pager [phone]  

## 2018-01-27 NOTE — Progress Notes (Signed)
Subjective: Feels a bit better, pain improved but still present.   Objective: Vital signs in last 24 hours: Temp:  [97.6 F (36.4 C)-99 F (37.2 C)] 97.8 F (36.6 C) (04/25 0645) Pulse Rate:  [93-110] 96 (04/25 0503) Resp:  [13-16] 13 (04/24 1730) BP: (107-143)/(43-59) 116/52 (04/25 0503) SpO2:  [93 %-97 %] 96 % (04/25 0503) Weight:  [64 kg (141 lb)] 64 kg (141 lb) (04/25 0645) Last BM Date: 01/07/2018  Intake/Output from previous day: 04/24 0701 - 04/25 0700 In: 2930.4 [P.O.:830; I.V.:1204.4; Blood:746; IV Piggyback:150] Out: 7253 [Urine:1550; Drains:15] Intake/Output this shift: No intake/output data recorded.  General appearance: Alert.  Comfortable.  Resp: clear to auscultation bilaterally GI: Tender with guarding across lower abdomen (has improved). Drain with purulent fluid output  Lab Results:  Recent Labs    01/26/18 0419 01/27/18 0234  WBC 27.0* 22.8*  HGB 8.9* 8.0*  HCT 26.3* 24.0*  PLT 195 158   BMET Recent Labs    01/26/18 0419 01/27/18 0234  NA 146* 136  K 4.2 3.9  CL 102 99*  CO2 40* 29  GLUCOSE 144* 177*  BUN 19 23*  CREATININE 0.94 0.92  CALCIUM 7.7* 7.9*   PT/INR Recent Labs    01/26/18 0419 01/27/18 0234  LABPROT 22.0* 18.3*  INR 1.94 1.53   ABG No results for input(s): PHART, HCO3 in the last 72 hours.  Invalid input(s): PCO2, PO2  Studies/Results: Ct Image Guided Drainage By Percutaneous Catheter  Result Date: 01/26/2018 INDICATION: Right lower quadrant abscess EXAM: CT GUIDED DRAINAGE OF RIGHT LOWER QUADRANT ABSCESS MEDICATIONS: The patient is currently admitted to the hospital and receiving intravenous antibiotics. The antibiotics were administered within an appropriate time frame prior to the initiation of the procedure. ANESTHESIA/SEDATION: One mg IV Versed 50 mcg IV Fentanyl Moderate Sedation Time:  11 minutes The patient was continuously monitored during the procedure by the interventional radiology nurse under my direct  supervision. COMPLICATIONS: None immediate. TECHNIQUE: Informed written consent was obtained from the patient after a thorough discussion of the procedural risks, benefits and alternatives. All questions were addressed. Maximal Sterile Barrier Technique was utilized including caps, mask, sterile gowns, sterile gloves, sterile drape, hand hygiene and skin antiseptic. A timeout was performed prior to the initiation of the procedure. PROCEDURE: The right lower quadrant was prepped with ChloraPrep in a sterile fashion, and a sterile drape was applied covering the operative field. A sterile gown and sterile gloves were used for the procedure. Local anesthesia was provided with 1% Lidocaine. Under CT guidance, an 18 gauge needle was inserted into the right lower quadrant abscess. After aspirating pus, the needle was removed over an Amplatz wire. Ten [place] dilator followed by a 10 [place] drain were inserted. It was looped and string fixed in the fluid collection. It was then sewn to the skin. FINDINGS: Images document placement of a 10 French drain into a right lower quadrant abscess cavity. IMPRESSION: Successful 10 French right lower quadrant abscess drainage. Electronically Signed   By: Marybelle Killings M.D.   On: 01/26/2018 16:57    Anti-infectives: Anti-infectives (From admission, onward)   Start     Dose/Rate Route Frequency Ordered Stop   01/22/18 1600  piperacillin-tazobactam (ZOSYN) IVPB 3.375 g     3.375 g 12.5 mL/hr over 240 Minutes Intravenous Every 8 hours 01/22/18 1014     01/20/18 1500  piperacillin-tazobactam (ZOSYN) IVPB 2.25 g  Status:  Discontinued     2.25 g 100 mL/hr over 30 Minutes Intravenous Every  6 hours 01/20/18 1211 01/22/18 1014   01/20/18 0500  piperacillin-tazobactam (ZOSYN) IVPB 2.25 g  Status:  Discontinued     2.25 g 100 mL/hr over 30 Minutes Intravenous Every 6 hours 01/29/2018 2220 01/20/18 1211   01/08/2018 2330  rifaximin (XIFAXAN) tablet 550 mg     550 mg Oral 2 times daily  01/17/2018 2224     01/15/2018 2230  piperacillin-tazobactam (ZOSYN) IVPB 3.375 g     3.375 g 100 mL/hr over 30 Minutes Intravenous  Once 01/18/2018 2220 01/06/2018 2300      Assessment/Plan:  Perforated appendicitis with abscess versus neoplasia.  Radiology favors appendicitis. Significant leukocytosis IV antibiotics - zosyn  Status post MVR and AVR on Coumadin Currently on heparin drip with INR 1.9  S/p perc drain 4/24- purulent output  She seems much improved compared to yesterday. Continue current care. Will start clears for comfort, continue TPN. Hopefully we can avoid laparotomy which will be very high risk due to her cardiac disease, atrial fibrillation, long-term Coumadin use, cirrhosis, portal hypertension, chronic diastolic heart failure( ?normal EF)   LOS: 8 days    Clovis Riley 01/27/2018

## 2018-01-27 NOTE — Consult Note (Signed)
Physical Medicine and Rehabilitation Consult   Reason for Consult: Debility due to medical issues.  Referring Physician: Dr. Maryland Pink   HPI: [name] Chung is a 84 y.o. female with history of AVR and mechanical MVR with redo, CAF- on coumadin, cirrhosis, diastolic CHF, recent treatment for UTI who was admitted on 01/17/2018 with abdominal pain with N/V due to partial SBO, leucocytosis and tachycardia due to SIRS.  She was evaluated by Dr. Donne Hazel who recommended conservative car with NPO with monitoring.  Cardiology following for management of fluid overload as well as Afib.  She continued to have rise in WBC with abdominal pain and repeat CT abdomen 4/20 revealed concerns of appendiceal abscess, increase in right and new left pleural effusion and new ascites.  [facility] course significant for urinary retention, hypokalemia, BLE pain, encephalopathy and coagulopathy requiring FFP as well as Vitamin K to reverse coumadin. NPO recommended but clears started per family insistence.  TPN added for nutritional support and she underwent CT guided drainage of RLQ abscess on 4/24.  Therapy evaluations completed today and patient limited by debility, BLE pain, STM deficits and hypoxia. CIR recommended due to functional deficits.      Review of Systems  Constitutional: Negative for chills and fever.  HENT: Negative for hearing loss and tinnitus.   Eyes: Negative for blurred vision and double vision.  Respiratory: Negative for cough and shortness of breath.   Cardiovascular: Negative for chest pain and palpitations.  Gastrointestinal: Positive for abdominal pain. Negative for nausea.  Genitourinary:       Foley in place  Musculoskeletal: Negative for myalgias.  Neurological: Positive for weakness.  Psychiatric/Behavioral: The patient is nervous/anxious.       Past Medical History:  Diagnosis Date  . Aortic insufficiency   . Arthritis   . Chronic anticoagulation   . Chronic atrial  fibrillation ([place])   . Edema   . Rheumatic heart disease   . Valvular heart disease     Past Surgical History:  Procedure Laterality Date  . ABDOMINAL HYSTERECTOMY    . AORTIC VALVE REPLACEMENT     1982, redo 1995 homograft, redo 2012 Medronic valve conduit-freestyle  . BACK SURGERY    . CATARACT EXTRACTION W/ INTRAOCULAR LENS  IMPLANT, BILATERAL    . MITRAL VALVE REPLACEMENT     1982, redo 1995 [employer]  valve    Family History  Problem Relation Age of Onset  . Heart attack Mother   . Stroke Mother   . Diabetes Mother   . Other Father        CAR ACCIDENT    Social History:  Married. Lives at [place] [facility] and was independent without AD.  Husband has dementia. She  reports that she has never smoked. She has never used smokeless tobacco. She reports that she does not drink alcohol or use drugs.     Allergies  Allergen Reactions  . Asa Buff (Mag [Buffered Aspirin] Other (See Comments)    On coumadin  . Codeine   . Mirtazapine Nausea Only  . Ultram [Tramadol Hcl]    Medications Prior to Admission  Medication Sig Dispense Refill  . digoxin (LANOXIN) 0.125 MG tablet TAKE ONE-HALF TABLET BY  MOUTH DAILY 45 tablet 3  . docusate sodium (COLACE) 100 MG capsule Take 100 mg by mouth 2 (two) times daily.    . ferrous sulfate 325 (65 FE) MG tablet Take 325 mg by mouth daily with breakfast.    .  furosemide (LASIX) 20 MG tablet TAKE 2 TABLETS BY MOUTH TWO TIMES DAILY 360 tablet 3  . loperamide (IMODIUM) 2 MG capsule Take 2 mg by mouth as needed for diarrhea or loose stools.    . metolazone (ZAROXOLYN) 2.5 MG tablet TAKE 1 TABLET BY MOUTH  TWICE WEEKLY 13 tablet 3  . Multiple Vitamin (MULTIVITAMIN) capsule Take 1 capsule by mouth daily.    . potassium chloride (K-DUR) 10 MEQ tablet Take 6 tablets ( 60 meq ) three times a day 90 tablet 3  . rifaximin (XIFAXAN) 550 MG TABS tablet Take 550 mg by mouth 2 (two) times daily.    . traZODone (DESYREL) 100 MG tablet Take 100 mg by mouth at  bedtime.     Marland Kitchen warfarin (COUMADIN) 5 MG tablet TAKE 1 TO 2 TABLETS BY  MOUTH EVERY DAY AS DIRECTED BY COUMADIN CLINIC (Patient taking differently: Take 5 mg by mouth one time only at 6 PM. 5 mg on Sunday and Thursday All other days 2.5 mg) 180 tablet 1  . spironolactone (ALDACTONE) 25 MG tablet Take 25 mg by mouth daily.      Home: Home Living Family/patient expects to be discharged to:: Private residence Living Arrangements: Spouse/significant other(husband has dementia) Available Help at Discharge: Family, Personal care attendant, Available 24 hours/day Type of Home: Independent living facility Home Access: Level entry Home Layout: One level Bathroom Shower/Tub: Multimedia programmer: Standard Home Equipment: Grab bars - tub/shower  Functional History: Prior Function Level of Independence: Independent Comments: at baseline patient very independent, drives, able to care for herself Functional Status:  Mobility: Bed Mobility Overal bed mobility: Needs Assistance Bed Mobility: Supine to Sit Supine to sit: Min assist General bed mobility comments: MinA to bring trunk up and steady at EOB, patient then able to maintain balance  Transfers Overall transfer level: Needs assistance Equipment used: Rolling walker (2 wheeled) Transfers: Sit to/from Stand, [employer] Transfers Sit to Stand: Mod assist, +2 physical assistance, +2 safety/equipment Stand pivot transfers: Min assist, +2 physical assistance, +2 safety/equipment General transfer comment: patient able to perform functional transfers with above level of assistance, mildly unsteady and requiring assist for balance  Ambulation/Gait General Gait Details: unable but did take multiple pivotal steps around to chair during transfer     ADL: ADL Overall ADL's : Needs assistance/impaired Eating/Feeding: Set up, Sitting Grooming: Moderate assistance, Sitting Upper Body Bathing: Minimal assistance, Sitting Lower Body  Bathing: Total assistance, Sit to/from stand Upper Body Dressing : Minimal assistance, Sitting Lower Body Dressing: Total assistance, Sit to/from stand Toilet Transfer: +2 for physical assistance, Minimal assistance, Stand-pivot, BSC, RW Toileting- Clothing Manipulation and Hygiene: Total assistance, Sit to/from stand  Cognition: Cognition Overall Cognitive Status: Impaired/Different from baseline Orientation Level: Oriented X4 Cognition Arousal/Alertness: Awake/alert Behavior During Therapy: WFL for tasks assessed/performed Overall Cognitive Status: Impaired/Different from baseline Area of Impairment: Memory, Following commands, Problem solving Memory: Decreased short-term memory Following Commands: Follows one step commands with increased time Problem Solving: Decreased initiation, Difficulty sequencing, Requires verbal cues  Blood pressure (!) 142/54, pulse 98, temperature 97.8 F (36.6 C), temperature source Oral, resp. rate 16, height 5' 5.5" (1.664 m), weight 64 kg (141 lb), SpO2 95 %. Physical Exam  Nursing note and vitals reviewed. Constitutional: She is oriented to person, place, and time. No distress. Nasal cannula in place.  Thin elderly female in NAD. Personal care aide in room.   HENT:  Head: Normocephalic.  Eyes: Pupils are equal, round, and reactive to light.  Neck: Normal range of motion.  Cardiovascular: Normal rate.  Respiratory: Effort normal.  GI: There is tenderness.  Drain in place RLQ with purulent drainage.   Musculoskeletal:  Moderate edema LUE and BLE. Unable to tolerate any movement of BLE/feet due to pain and anxiety.   Neurological: She is alert and oriented to person, place, and time.  Strength 4/5 bilateral UE. LE limited by ?pain---1-2/5. Sensation intact to pain  Skin: She is not diaphoretic.  Psychiatric:  anxious    Results for orders placed or performed during the hospital encounter of 01/13/2018 (from the past 24 hour(s))    Aerobic/Anaerobic Culture (surgical/deep wound)     Status: None (Preliminary result)   Collection Time: 01/26/18  4:38 PM  Result Value Ref Range   Specimen Description ABSCESS ABDOMEN    Special Requests NONE    Gram Stain      MODERATE WBC PRESENT, PREDOMINANTLY PMN NO ORGANISMS SEEN    Culture      NO GROWTH < 24 HOURS Performed at [facility] [address] [place], [place] [address]    Report Status PENDING   Glucose, capillary     Status: Abnormal   Collection Time: 01/26/18  4:59 PM  Result Value Ref Range   Glucose-Capillary 138 (H) 65 - 99 mg/dL  Glucose, capillary     Status: Abnormal   Collection Time: 01/27/18 12:07 AM  Result Value Ref Range   Glucose-Capillary 160 (H) 65 - 99 mg/dL  Heparin level (unfractionated)     Status: Abnormal   Collection Time: 01/27/18  2:34 AM  Result Value Ref Range   Heparin Unfractionated 0.24 (L) 0.30 - 0.70 IU/mL  CBC     Status: Abnormal   Collection Time: 01/27/18  2:34 AM  Result Value Ref Range   WBC 22.8 (H) 4.0 - 10.5 K/uL   RBC 2.23 (L) 3.87 - 5.11 MIL/uL   Hemoglobin 8.0 (L) 12.0 - 15.0 g/dL   HCT 24.0 (L) 36.0 - 46.0 %   MCV 107.6 (H) 78.0 - 100.0 fL   MCH 35.9 (H) 26.0 - 34.0 pg   MCHC 33.3 30.0 - 36.0 g/dL   RDW 15.0 11.5 - 15.5 %   Platelets 158 150 - 400 K/uL  Comprehensive metabolic panel     Status: Abnormal   Collection Time: 01/27/18  2:34 AM  Result Value Ref Range   Sodium 136 135 - 145 mmol/L   Potassium 3.9 3.5 - 5.1 mmol/L   Chloride 99 (L) 101 - 111 mmol/L   CO2 29 22 - 32 mmol/L   Glucose, Bld 177 (H) 65 - 99 mg/dL   BUN 23 (H) 6 - 20 mg/dL   Creatinine, Ser 0.92 0.44 - 1.00 mg/dL   Calcium 7.9 (L) 8.9 - 10.3 mg/dL   Total Protein 5.6 (L) 6.5 - 8.1 g/dL   Albumin 1.9 (L) 3.5 - 5.0 g/dL   AST 30 15 - 41 U/L   ALT 12 (L) 14 - 54 U/L   Alkaline Phosphatase 70 38 - 126 U/L   Total Bilirubin 1.2 0.3 - 1.2 mg/dL   GFR calc non Af Amer 56 (L) >60 mL/min   GFR calc Af Amer >60 >60 mL/min    Anion gap 8 5 - 15  Magnesium     Status: None   Collection Time: 01/27/18  2:34 AM  Result Value Ref Range   Magnesium 1.9 1.7 - 2.4 mg/dL  Phosphorus     Status:  None   Collection Time: 01/27/18  2:34 AM  Result Value Ref Range   Phosphorus 3.0 2.5 - 4.6 mg/dL  Protime-INR     Status: Abnormal   Collection Time: 01/27/18  2:34 AM  Result Value Ref Range   Prothrombin Time 18.3 (H) 11.4 - 15.2 seconds   INR 1.53   Glucose, capillary     Status: Abnormal   Collection Time: 01/27/18  6:16 AM  Result Value Ref Range   Glucose-Capillary 150 (H) 65 - 99 mg/dL  Glucose, capillary     Status: Abnormal   Collection Time: 01/27/18  1:04 PM  Result Value Ref Range   Glucose-Capillary 180 (H) 65 - 99 mg/dL  Heparin level (unfractionated)     Status: Abnormal   Collection Time: 01/27/18  1:16 PM  Result Value Ref Range   Heparin Unfractionated 0.14 (L) 0.30 - 0.70 IU/mL   Ct Image Guided Drainage By Percutaneous Catheter  Result Date: 01/26/2018 INDICATION: Right lower quadrant abscess EXAM: CT GUIDED DRAINAGE OF RIGHT LOWER QUADRANT ABSCESS MEDICATIONS: The patient is currently admitted to the hospital and receiving intravenous antibiotics. The antibiotics were administered within an appropriate time frame prior to the initiation of the procedure. ANESTHESIA/SEDATION: One mg IV Versed 50 mcg IV Fentanyl Moderate Sedation Time:  11 minutes The patient was continuously monitored during the procedure by the interventional radiology nurse under my direct supervision. COMPLICATIONS: None immediate. TECHNIQUE: Informed written consent was obtained from the patient after a thorough discussion of the procedural risks, benefits and alternatives. All questions were addressed. Maximal Sterile Barrier Technique was utilized including caps, mask, sterile gowns, sterile gloves, sterile drape, hand hygiene and skin antiseptic. A timeout was performed prior to the initiation of the procedure. PROCEDURE: The  right lower quadrant was prepped with ChloraPrep in a sterile fashion, and a sterile drape was applied covering the operative field. A sterile gown and sterile gloves were used for the procedure. Local anesthesia was provided with 1% Lidocaine. Under CT guidance, an 18 gauge needle was inserted into the right lower quadrant abscess. After aspirating pus, the needle was removed over an Amplatz wire. Ten [place] dilator followed by a 10 [place] drain were inserted. It was looped and string fixed in the fluid collection. It was then sewn to the skin. FINDINGS: Images document placement of a 10 French drain into a right lower quadrant abscess cavity. IMPRESSION: Successful 10 French right lower quadrant abscess drainage. Electronically Signed   By: Marybelle Killings M.D.   On: 01/26/2018 16:57     Assessment/Plan: Diagnosis: debility after multiple medical 1. Does the need for close, 24 hr/day medical supervision in concert with the patient's rehab needs make it unreasonable for this patient to be served in a less intensive setting? No 2. Co-Morbidities requiring supervision/potential complications: afib, a/c renal failure. TNA 3. Due to bladder management, bowel management, safety, skin/wound care, disease management, medication administration, pain management and patient education, does the patient require 24 hr/day rehab nursing? No 4. Does the patient require coordinated care of a physician, rehab nurse, PT, OT to address physical and functional deficits in the context of the above medical diagnosis(es)? No Addressing deficits in the following areas: balance, endurance, locomotion, bowel/bladder control, dressing, grooming and toileting 5. Can the patient actively participate in an intensive therapy program of at least 3 hrs of therapy per day at least 5 days per week? No 6. The potential for patient to make measurable gains while on inpatient rehab is fair  7. Anticipated functional outcomes upon discharge  from inpatient rehab are n/a  with PT, n/a with OT, n/a with SLP. 8. Estimated rehab length of stay to reach the above functional goals is: n/a 9. Anticipated D/C setting: Other 10. Anticipated post D/C treatments: N/A 11. Overall Rehab/Functional Prognosis: fair  RECOMMENDATIONS: This patient's condition is appropriate for continued rehabilitative care in the following setting: SNF and LTACH Patient has agreed to participate in recommended program. Potentially Note that insurance prior authorization may be required for reimbursement for recommended care.  Comment: Pt at home with husband with dementia. Pt with limited participation on exam. Highly anxious. Would be best served with lower intensity therapy.  Meredith Staggers, MD, [facility] 01/28/2018     Bary Leriche, PA-C 01/27/2018

## 2018-01-27 NOTE — Evaluation (Signed)
Occupational Therapy Evaluation Patient Details Name: [name] Chung MRN: [id number] DOB: July 31, 1933 Today's Date: 01/27/2018    History of Present Illness 84yo female presenting with nausea, vomiting, abdominal pain, diarrhea, abdominal distension. CT positive for partial SBO, also diagnosed with sepsis, abdominal drain placed. PMH OA, aortic insufficiency, A-fib, rheumatic heart disease, hx aortic valve replacement, hx mitral valve replacement    Clinical Impression   Pt is typically independent and drives. She and her husband moved to [place] when she developed cirrhosis in November per daughter. Her husband received care of a caregiver due to dementia, but is mobile. Pt presents with generalized weakness, decreased activity tolerance and impaired standing balance. She demonstrates some impairment in cognition which is atypical. Her feet are painful with touch. Pt with desaturation to 82% on RA, 2 L 02 required to maintain Sp02 in the 90s.  Pt requires min to total assist for ADL. She will need intensive rehab prior to return home. Will follow acutely.    Follow Up Recommendations  CIR    Equipment Recommendations       Recommendations for Other Services Rehab consult     Precautions / Restrictions Precautions Precautions: Fall;Other (comment) Precaution Comments: very sensitive feet (painful) Restrictions Weight Bearing Restrictions: No      Mobility Bed Mobility Overal bed mobility: Needs Assistance Bed Mobility: Supine to Sit     Supine to sit: Min assist     General bed mobility comments: MinA to bring trunk up and steady at EOB, patient then able to maintain balance   Transfers Overall transfer level: Needs assistance Equipment used: Rolling walker (2 wheeled) Transfers: Sit to/from [facility] Sit to Stand: Mod assist;+2 physical assistance;+2 safety/equipment Stand pivot transfers: Min assist;+2 physical assistance;+2 safety/equipment        General transfer comment: patient able to perform functional transfers with above level of assistance, mildly unsteady and requiring assist for balance     Balance Overall balance assessment: Needs assistance Sitting-balance support: Bilateral upper extremity supported;Feet supported Sitting balance-Leahy Scale: Fair     Standing balance support: Bilateral upper extremity supported;During functional activity Standing balance-Leahy Scale: Poor Standing balance comment: general unsteadiness, reliant on B UE support and external assist to steady                            ADL either performed or assessed with clinical judgement   ADL Overall ADL's : Needs assistance/impaired Eating/Feeding: Set up;Sitting   Grooming: Moderate assistance;Sitting   Upper Body Bathing: Minimal assistance;Sitting   Lower Body Bathing: Total assistance;Sit to/from stand   Upper Body Dressing : Minimal assistance;Sitting   Lower Body Dressing: Total assistance;Sit to/from stand   Toilet Transfer: +2 for physical assistance;Minimal assistance;Stand-pivot;BSC;RW   Toileting- Clothing Manipulation and Hygiene: Total assistance;Sit to/from stand               Vision Patient Visual Report: No change from baseline       Perception     Praxis      Pertinent Vitals/Pain Pain Assessment: 0-10 Pain Score: 7  Pain Location: feet Pain Descriptors / Indicators: Grimacing;Guarding Pain Intervention(s): Monitored during session;Repositioned     Hand Dominance Right   Extremity/Trunk Assessment Upper Extremity Assessment Upper Extremity Assessment: Generalized weakness   Lower Extremity Assessment Lower Extremity Assessment: Defer to PT evaluation   Cervical / Trunk Assessment Cervical / Trunk Assessment: Kyphotic   Communication Communication Communication: No difficulties   Cognition  Arousal/Alertness: Awake/alert Behavior During Therapy: WFL for tasks  assessed/performed Overall Cognitive Status: Impaired/Different from baseline Area of Impairment: Memory;Following commands;Problem solving                     Memory: Decreased short-term memory Following Commands: Follows one step commands with increased time     Problem Solving: Decreased initiation;Difficulty sequencing;Requires verbal cues     General Comments  SpO2 dropped to 82% on room air sitting EOB, returned supplemental O2 per nasal cannula and it returned to 90s, HR variable and reached 121 BPM at highest. Left up in chair with VSS.     Exercises     Shoulder Instructions      Home Living Family/patient expects to be discharged to:: Private residence Living Arrangements: Spouse/significant other(husband has dementia) Available Help at Discharge: Family;Personal care attendant;Available 24 hours/day Type of Home: Independent living facility Home Access: Level entry     Home Layout: One level     Bathroom Shower/Tub: Occupational psychologist: Standard     Home Equipment: Grab bars - tub/shower          Prior Functioning/Environment Level of Independence: Independent        Comments: at baseline patient very independent, drives, able to care for herself        OT Problem List: Decreased strength;Decreased activity tolerance;Impaired balance (sitting and/or standing);Decreased cognition;Decreased safety awareness;Decreased knowledge of use of DME or AE;Pain      OT Treatment/Interventions: Self-care/ADL training;DME and/or AE instruction;Therapeutic activities;Patient/family education;Balance training;Cognitive remediation/compensation    OT Goals(Current goals can be found in the care plan section) Acute Rehab OT Goals Patient Stated Goal: to get well, return to PLOF  OT Goal Formulation: With patient Time For Goal Achievement: 02/10/18 Potential to Achieve Goals: Good ADL Goals Pt Will Perform Grooming: with set-up;sitting Pt  Will Perform Upper Body Dressing: with supervision;sitting;with set-up Pt Will Perform Lower Body Dressing: with min assist;sit to/from stand Pt Will Transfer to Toilet: with min assist;ambulating;bedside commode Pt Will Perform Toileting - Clothing Manipulation and hygiene: with min assist;sit to/from stand Additional ADL Goal #1: Pt will perform bed mobility with supervision in preparation for ADL.  OT Frequency: Min 2X/week   Barriers to D/C:            Co-evaluation PT/OT/SLP Co-Evaluation/Treatment: Yes Reason for Co-Treatment: For patient/therapist safety;Complexity of the patient's impairments (multi-system involvement) PT goals addressed during session: Mobility/safety with mobility;Proper use of DME OT goals addressed during session: ADL's and self-care      AM-PAC PT "6 Clicks" Daily Activity     Outcome Measure Help from another person eating meals?: A Little Help from another person taking care of personal grooming?: A Lot Help from another person toileting, which includes using toliet, bedpan, or urinal?: Total Help from another person bathing (including washing, rinsing, drying)?: A Lot Help from another person to put on and taking off regular upper body clothing?: A Little Help from another person to put on and taking off regular lower body clothing?: Total 6 Click Score: 12   End of Session Equipment Utilized During Treatment: Gait belt;Rolling walker;Oxygen Nurse Communication: Mobility status  Activity Tolerance: Patient tolerated treatment well Patient left: in chair;with call bell/phone within reach;with family/visitor present;with nursing/sitter in room  OT Visit Diagnosis: Unsteadiness on feet (R26.81);Other abnormalities of gait and mobility (R26.89);Muscle weakness (generalized) (M62.81);Other symptoms and signs involving cognitive function;Pain  Time: 2376-2831 OT Time Calculation (min): 30 min Charges:  OT General Charges $OT Visit: 1  Visit OT Evaluation $OT Eval Moderate Complexity: 1 Mod G-Codes:     [name] 01/27/2018, 11:05 AM  01/27/2018 Nestor Lewandowsky, OTR/L Pager: [phone]

## 2018-01-27 NOTE — Progress Notes (Signed)
Progress Note  Patient Name: [name] Chung Date of Encounter: 01/27/2018  Primary Cardiologist: Kingson Lohmeyer [place], MD  Subjective   Laying bed,  weak,  Still has some abdominal pain but a little better. Complains of bilateral foot pain R>L.   Inpatient Medications    Scheduled Meds: . digoxin  0.0625 mg Oral Daily  . furosemide  20 mg Intravenous BID  . insulin aspart  0-9 Units Subcutaneous Q6H  . mouth rinse  15 mL Mouth Rinse BID  . metoprolol tartrate  2.5 mg Intravenous Q8H  . rifaximin  550 mg Oral BID  . sodium chloride flush  10-40 mL Intracatheter Q12H  . sodium chloride flush  5 mL Intracatheter Q8H  . traZODone  100 mg Oral QHS   Continuous Infusions: . heparin 1,900 Units/hr (01/27/18 0325)  . magnesium sulfate 1 - 4 g bolus IVPB    . piperacillin-tazobactam (ZOSYN)  IV Stopped (01/27/18 0925)  . TPN ADULT (ION) 65 mL/hr at 01/26/18 1744  . TPN ADULT (ION)     PRN Meds: acetaminophen **OR** acetaminophen, hydrALAZINE, morphine injection, ondansetron (ZOFRAN) IV, sodium chloride flush, zolpidem   Vital Signs    Vitals:   01/27/18 0103 01/27/18 0503 01/27/18 0645 01/27/18 0800  BP: (!) 123/56 (!) 116/52  (!) 134/57  Pulse:  96  100  Resp:    16  Temp: 98.5 F (36.9 C) 97.8 F (36.6 C) 97.8 F (36.6 C) 97.8 F (36.6 C)  TempSrc: Oral Oral Oral Oral  SpO2:  96%  100%  Weight:   141 lb (64 kg)   Height:        Intake/Output Summary (Last 24 hours) at 01/27/2018 0933 Last data filed at 01/27/2018 0617 Gross per 24 hour  Intake 2930.43 ml  Output 1565 ml  Net 1365.43 ml   Filed Weights   01/25/18 0300 01/26/18 0620 01/27/18 0645  Weight: 145 lb (65.8 kg) 140 lb (63.5 kg) 141 lb (64 kg)    Telemetry    AFib Rate 80-100 - Personally Reviewed  ECG    N/a  - Personally Reviewed  Physical Exam   General: Thin frail older W female appearing ill Head: Normocephalic, atraumatic.  Neck: Supple, no JVD. Lungs:  Resp regular and unlabored, Clear  anteriorly. Heart: Irreg Irreg, S1, S2, soft systolic murmur with good mechanical valve AV and MV clicks; no rub. Abdomen: Soft, normoactive bowel sounds. Extremities: No clubbing, cyanosis, edema. Feet are warm and dry. No swelling or erythema. Support hose removed. Neuro: Alert and oriented X 3. Moves all extremities spontaneously. Psych: Normal affect.  Labs    Chemistry Recent Labs  Lab 01/21/18 0831  01/24/18 0412 01/25/18 0502 01/26/18 0419 01/27/18 0234  NA 134*   < > 134* 136 146* 136  K 3.3*   < > 2.8* 3.1* 4.2 3.9  CL 98*   < > 98* 100* 102 99*  CO2 26   < > 21* 30 40* 29  GLUCOSE 97   < > 138* 152* 144* 177*  BUN 30*   < > 18 18 19  23*  CREATININE 1.35*   < > 0.99 0.94 0.94 0.92  CALCIUM 8.2*   < > 7.3* 7.7* 7.7* 7.9*  PROT 6.4*  --  5.1*  --   --  5.6*  ALBUMIN 2.2*  --  1.7*  --  1.6* 1.9*  AST 37  --  32  --   --  30  ALT 16  --  13*  --   --  12*  ALKPHOS 68  --  72  --   --  70  BILITOT 1.8*  --  1.4*  --   --  1.2  GFRNONAA 35*   < > 51* 55* 55* 56*  GFRAA 41*   < > 59* >60 >60 >60  ANIONGAP 10   < > 15 6 4* 8   < > = values in this interval not displayed.     Hematology Recent Labs  Lab 01/25/18 0502 01/26/18 0419 01/27/18 0234  WBC 22.0* 27.0* 22.8*  RBC 2.51* 2.47* 2.23*  HGB 8.8* 8.9* 8.0*  HCT 26.5* 26.3* 24.0*  MCV 105.6* 106.5* 107.6*  MCH 35.1* 36.0* 35.9*  MCHC 33.2 33.8 33.3  RDW 14.1 14.5 15.0  PLT 207 195 158    Cardiac EnzymesNo results for input(s): TROPONINI in the last 168 hours. No results for input(s): TROPIPOC in the last 168 hours.   BNP No results for input(s): BNP, PROBNP in the last 168 hours.   DDimer No results for input(s): DDIMER in the last 168 hours.    Radiology    Ct Image Guided Drainage By Percutaneous Catheter  Result Date: 01/26/2018 INDICATION: Right lower quadrant abscess EXAM: CT GUIDED DRAINAGE OF RIGHT LOWER QUADRANT ABSCESS MEDICATIONS: The patient is currently admitted to the hospital and  receiving intravenous antibiotics. The antibiotics were administered within an appropriate time frame prior to the initiation of the procedure. ANESTHESIA/SEDATION: One mg IV Versed 50 mcg IV Fentanyl Moderate Sedation Time:  11 minutes The patient was continuously monitored during the procedure by the interventional radiology nurse under my direct supervision. COMPLICATIONS: None immediate. TECHNIQUE: Informed written consent was obtained from the patient after a thorough discussion of the procedural risks, benefits and alternatives. All questions were addressed. Maximal Sterile Barrier Technique was utilized including caps, mask, sterile gowns, sterile gloves, sterile drape, hand hygiene and skin antiseptic. A timeout was performed prior to the initiation of the procedure. PROCEDURE: The right lower quadrant was prepped with ChloraPrep in a sterile fashion, and a sterile drape was applied covering the operative field. A sterile gown and sterile gloves were used for the procedure. Local anesthesia was provided with 1% Lidocaine. Under CT guidance, an 18 gauge needle was inserted into the right lower quadrant abscess. After aspirating pus, the needle was removed over an Amplatz wire. Ten [place] dilator followed by a 10 [place] drain were inserted. It was looped and string fixed in the fluid collection. It was then sewn to the skin. FINDINGS: Images document placement of a 10 French drain into a right lower quadrant abscess cavity. IMPRESSION: Successful 10 French right lower quadrant abscess drainage. Electronically Signed   By: Marybelle Killings M.D.   On: 01/26/2018 16:57    Cardiac Studies   N/a   Patient Profile     84 y.o. female with a history ofrheumatic valvular heart disease status post redo aortic valve replacement x3 and redo mitral valve surgery x2, persistent atrial fibrillation, coumadin for anticoagulation, chronic LE edema (multifactorial with diastolic CHF, venous insufficiency, cirrhosis)now  admitted with SBO. We are assisting with management of diastolic heart failure and atrial fibrillation.   Assessment & Plan    1. Rheumatic heart disease s/p AVR x3 and MVR x2: Has a [employer]. Jude mechanical mitral valve and Medtronic freestyle aortic root conduit.  -- coumadin has been held for procedures with  heparin bridge.  INR down up a little to 1.5 today.  s/p IR drainage of abscess yesterday.   Continue IV heparin for now until no further interventions needed.  2. Abd pain 2/2 to perforated appendicitis with abscess: CT scan with concern for perforated appendicitis, surgery following s/p drain placement. WBC has decreased.  3. Chronic Afib: on IV lopressor and home dose Dig with good rate control. Rate controlled. Now on heparin bridge with coumadin held.   4. Acute on Chronic diastolic HF: Last echo from 11/18 with normal EF and no WMA. CXR on admission with edema and right sided pleural effusion. Remains on IV lasix. Net - 4 L since admit.  Cr stable. Once able to take pos well we can transition metoprolol and lasix to po  5. Hypokalemia: repleted  Signed, Raybon Conard [place], MD  01/27/2018, 9:33 AM   For questions or updates, please contact [place] Please consult [email or web address] for contact info under Cardiology/STEMI.

## 2018-01-27 NOTE — Progress Notes (Signed)
PROGRESS NOTE  NALA KACHEL QQP:[id number] DOB: Nov 25, 1932 DOA: 01/23/2018 PCP: Lajean Manes, MD  HPI/Recap of past 84 hours: 84 year old female with past medical history of chronic atrial fibrillation, mechanical atrial and mitral valves on Coumadin, chronic diastolic heart failure and stage III chronic kidney disease admitted on 4/17 after presenting with 3 days of nausea, vomiting and abdominal distention and had recent treatment for UTI.  In emergency room, patient had a CT suggestive of small bowel obstruction.  General surgery consulted and when conservative measures failed, they recommended follow-up CT of the abdomen which noted perforated appendix with abscess.  Patient's INR reversed with vitamin K and she underwent by interventional radiology a CT-guided percutaneous drainage of the abscess.  Cardiology during her hospitalization has been consulted for decompensated CHF and A. fib with RVR.    No events overnight.  Patient feeling very tired.  She had some choking spells when I first came in and as per her stepdaughter, occasionally does have problems with dysphagia although she has never actually had a swallow evaluation done.  Patient denies shortness of breath, complains of abdominal soreness.  Assessment/Plan: Principal Problem:   Partial small bowel obstruction ([place]) patient denies any shortness of breath, complains of abdominal soreness. Active Problems:   Chronic atrial fibrillation (HCC)   Long term current use of anticoagulant therapy   S/P MVR (mitral valve replacement)   S/P AVR (aortic valve replacement)   Acute renal failure superimposed on stage 3 chronic kidney disease (HCC)   Atrial fibrillation with RVR (HCC)   Chronic systolic (congestive) heart failure (HCC)   Hyponatremia   Sepsis (HCC)   Liver cirrhosis (HCC)  Complex appendiceal abscess initially presenting as possible small bowel obstruction: Status post drain.  White blood cell count trending downward.   Appreciate general surgery and interventional radiology help.  Continue IV antibiotics, monitoring closely.  On TPN per pharmacy.  Chronic A-fib/MVR/AVR: Appreciate cardiology help.  On IV Lopressor and digoxin.  On heparin bridge with Coumadin held.  Heart rate controlled.  Chads 2 score of 5.  Acute on chronic diastolic heart failure: Echocardiogram done November 2018 notes normal ejection fraction.  On IV Lasix and has diuresed over 4 L.  Creatinine remained stable.  Hypokalemia: Replacing.  Cirrhosis with portal hypertension: Outpatient GI follow-up.  No signs of bleeding.  Stable.  Stepdaughter notes the patient is not able to take lactulose, but her mentation appears stable at this time.  No reason to check ammonia level unless she becomes confused.  Anemia of chronic disease: Secondary to advanced liver disease with hemoglobin remains stable.  Fortunately, no signs of bleeding.  Dysphagia: Looks to be a chronic process although patient is never had a formal work-up, and have consulted speech therapy.  Deconditioning: Seen by physical therapy who recommended inpatient rehab.  Have consulted   Code Status: Full  Family Communication: Stepdaughter at the bedside   Consultants:  General surgery  Cardiology  IR  Physical medicine and rehab  Procedures:  appendiceal abscess drain tube placement 4/24  Antimicrobials:  IV zosyn 4/17-present  DVT prophylaxis:  SCDs   Objective: Vitals:   01/27/18 0645 01/27/18 0800 01/27/18 0948 01/27/18 1414  BP:  (!) 134/57  (!) 142/54  Pulse:  100 (!) 104 98  Resp:  16  16  Temp: 97.8 F (36.6 C) 97.8 F (36.6 C)    TempSrc: Oral Oral    SpO2:  100%  95%  Weight: 64 kg (141 lb)  Height:        Intake/Output Summary (Last 24 hours) at 01/27/2018 1428 Last data filed at 01/27/2018 1200 Gross per 24 hour  Intake 1478.9 ml  Output 1565 ml  Net -86.1 ml   Filed Weights   01/25/18 0300 01/26/18 0620 01/27/18 0645    Weight: 65.8 kg (145 lb) 63.5 kg (140 lb) 64 kg (141 lb)    Exam:   General: Alert and oriented x3, mild distress from abdominal pain and dysphagia  HEENT: Normocephalic and atraumatic, mucous memories are dry  Neck: Supple, mild JVD  Lungs: Scattered rales, mild expiratory wheezing  Cardiovascular: Irregular rhythm, rate controlled  Abdomen: Soft, mild distention, noted drain, few bowel sounds  Extremities: No clubbing or cyanosis, 1-2+ pitting edema from the knees down  Neuro: No focal deficits  Skin: No skin breaks, tears or lesions  Psychiatric: Patient is appropriate, no evidence of psy dysphagia: This looks to be a chronic process choses   Data Reviewed: CBC: Recent Labs  Lab 01/23/18 0642 01/24/18 0412 01/25/18 0502 01/26/18 0419 01/27/18 0234  WBC 22.2* 20.4* 22.0* 27.0* 22.8*  NEUTROABS  --  15.7*  --  21.9*  --   HGB 9.1* 8.9* 8.8* 8.9* 8.0*  HCT 26.3* 25.8* 26.5* 26.3* 24.0*  MCV 102.7* 103.6* 105.6* 106.5* 107.6*  PLT 195 212 207 195 322   Basic Metabolic Panel: Recent Labs  Lab 01/22/18 0601 01/23/18 0642 01/24/18 0412 01/25/18 0502 01/26/18 0419 01/27/18 0234  NA 131* 130* 134* 136 146* 136  K 3.4* 3.8 2.8* 3.1* 4.2 3.9  CL 98* 99* 98* 100* 102 99*  CO2 24 23 21* 30 40* 29  GLUCOSE 111* 112* 138* 152* 144* 177*  BUN 29* 23* 18 18 19  23*  CREATININE 1.29* 1.12* 0.99 0.94 0.94 0.92  CALCIUM 7.7* 7.6* 7.3* 7.7* 7.7* 7.9*  MG 1.6* 1.9 1.7 1.9  --  1.9  PHOS  --  2.5 3.0  --  2.2* 3.0   GFR: Estimated Creatinine Clearance: 42.6 mL/min (by C-G formula based on SCr of 0.92 mg/dL). Liver Function Tests: Recent Labs  Lab 01/21/18 0831 01/24/18 0412 01/26/18 0419 01/27/18 0234  AST 37 32  --  30  ALT 16 13*  --  12*  ALKPHOS 68 72  --  70  BILITOT 1.8* 1.4*  --  1.2  PROT 6.4* 5.1*  --  5.6*  ALBUMIN 2.2* 1.7* 1.6* 1.9*   No results for input(s): LIPASE, AMYLASE in the last 168 hours. No results for input(s): AMMONIA in the last  168 hours. Coagulation Profile: Recent Labs  Lab 01/23/18 2019 01/24/18 0412 01/25/18 0502 01/26/18 0419 01/27/18 0234  INR 2.69 2.09 1.78 1.94 1.53   Cardiac Enzymes: No results for input(s): CKTOTAL, CKMB, CKMBINDEX, TROPONINI in the last 168 hours. BNP (last 3 results) No results for input(s): PROBNP in the last 8760 hours. HbA1C: No results for input(s): HGBA1C in the last 72 hours. CBG: Recent Labs  Lab 01/26/18 1232 01/26/18 1659 01/27/18 0007 01/27/18 0616 01/27/18 1304  GLUCAP 157* 138* 160* 150* 180*   Lipid Profile: No results for input(s): CHOL, HDL, LDLCALC, TRIG, CHOLHDL, LDLDIRECT in the last 72 hours. Thyroid Function Tests: No results for input(s): TSH, T4TOTAL, FREET4, T3FREE, THYROIDAB in the last 72 hours. Anemia Panel: No results for input(s): VITAMINB12, FOLATE, FERRITIN, TIBC, IRON, RETICCTPCT in the last 72 hours. Urine analysis:    Component Value Date/Time   COLORURINE YELLOW 01/10/2018 [place] 01/21/2018  [place] 08/17/2017 1457   LABSPEC 1.012 01/04/2018 1538   PHURINE 5.0 01/13/2018 1538   GLUCOSEU NEGATIVE 01/06/2018 1538   HGBUR NEGATIVE 01/15/2018 1538   BILIRUBINUR NEGATIVE 01/09/2018 1538   BILIRUBINUR Negative 08/17/2017 [place] 01/09/2018 1538   PROTEINUR NEGATIVE 01/11/2018 1538   NITRITE NEGATIVE 01/26/2018 1538   LEUKOCYTESUR TRACE (A) 01/30/2018 1538   LEUKOCYTESUR Negative 08/17/2017 1457   Sepsis Labs: @LABRCNTIP (procalcitonin:4,lacticidven:4)  ) Recent Results (from the past 240 hour(s))  Urine Culture     Status: Abnormal   Collection Time: 01/28/2018 10:19 PM  Result Value Ref Range Status   Specimen Description URINE, RANDOM  Final   Special Requests NONE  Final   Culture (A)  Final    <10,000 COLONIES/mL INSIGNIFICANT GROWTH Performed at [facility], [place] [address] [place], [place] [address]    Report Status 01/21/2018 FINAL  Final  Culture, blood  (x 2)     Status: None   Collection Time: 01/08/2018 10:46 PM  Result Value Ref Range Status   Specimen Description BLOOD RIGHT ANTECUBITAL  Final   Special Requests   Final    BOTTLES DRAWN AEROBIC AND ANAEROBIC Blood Culture adequate volume   Culture   Final    NO GROWTH 5 DAYS Performed at [facility], [place] [address] [place], [place] [address]    Report Status 01/25/2018 FINAL  Final  Culture, blood (Routine X 2) w Reflex to ID Panel     Status: None   Collection Time: 01/20/18 12:24 PM  Result Value Ref Range Status   Specimen Description BLOOD RIGHT HAND  Final   Special Requests   Final    BOTTLES DRAWN AEROBIC AND ANAEROBIC Blood Culture adequate volume   Culture   Final    NO GROWTH 5 DAYS Performed at [facility], [place] [address] [place], [place] [address]    Report Status 01/25/2018 FINAL  Final  Culture, blood (Routine X 2) w Reflex to ID Panel     Status: None   Collection Time: 01/20/18  8:24 PM  Result Value Ref Range Status   Specimen Description BLOOD RIGHT ARM  Final   Special Requests   Final    BOTTLES DRAWN AEROBIC ONLY Blood Culture adequate volume   Culture   Final    NO GROWTH 5 DAYS Performed at [facility], [place] [address] [place], [place] [address]    Report Status 01/25/2018 FINAL  Final  MRSA PCR Screening     Status: None   Collection Time: 01/23/18  5:18 AM  Result Value Ref Range Status   MRSA by PCR NEGATIVE NEGATIVE Final    Comment:        The GeneXpert MRSA Assay (FDA approved for NASAL specimens only), is one component of a comprehensive MRSA colonization surveillance program. It is not intended to diagnose MRSA infection nor to guide or monitor treatment for MRSA infections. Performed at [facility], [place] [address] [place], [place] [address]   Aerobic/Anaerobic Culture (surgical/deep wound)     Status: None (Preliminary result)   Collection Time: 01/26/18  4:38 PM  Result Value Ref Range Status    Specimen Description ABSCESS ABDOMEN  Final   Special Requests NONE  Final   Gram Stain   Final    MODERATE WBC PRESENT, PREDOMINANTLY PMN NO ORGANISMS SEEN    Culture   Final    NO  GROWTH < 24 HOURS Performed at [facility], [place] [address] [place], [place] [address]    Report Status PENDING  Incomplete      Studies: Ct Image Guided Drainage By Percutaneous Catheter  Result Date: 01/26/2018 INDICATION: Right lower quadrant abscess EXAM: CT GUIDED DRAINAGE OF RIGHT LOWER QUADRANT ABSCESS MEDICATIONS: The patient is currently admitted to the hospital and receiving intravenous antibiotics. The antibiotics were administered within an appropriate time frame prior to the initiation of the procedure. ANESTHESIA/SEDATION: One mg IV Versed 50 mcg IV Fentanyl Moderate Sedation Time:  11 minutes The patient was continuously monitored during the procedure by the interventional radiology nurse under my direct supervision. COMPLICATIONS: None immediate. TECHNIQUE: Informed written consent was obtained from the patient after a thorough discussion of the procedural risks, benefits and alternatives. All questions were addressed. Maximal Sterile Barrier Technique was utilized including caps, mask, sterile gowns, sterile gloves, sterile drape, hand hygiene and skin antiseptic. A timeout was performed prior to the initiation of the procedure. PROCEDURE: The right lower quadrant was prepped with ChloraPrep in a sterile fashion, and a sterile drape was applied covering the operative field. A sterile gown and sterile gloves were used for the procedure. Local anesthesia was provided with 1% Lidocaine. Under CT guidance, an 18 gauge needle was inserted into the right lower quadrant abscess. After aspirating pus, the needle was removed over an Amplatz wire. Ten [place] dilator followed by a 10 [place] drain were inserted. It was looped and string fixed in the fluid collection. It was then sewn to the skin. FINDINGS:  Images document placement of a 10 French drain into a right lower quadrant abscess cavity. IMPRESSION: Successful 10 French right lower quadrant abscess drainage. Electronically Signed   By: Marybelle Killings M.D.   On: 01/26/2018 16:57    Scheduled Meds: . digoxin  0.0625 mg Oral Daily  . feeding supplement  1 Container Oral BID BM  . furosemide  20 mg Intravenous BID  . insulin aspart  0-9 Units Subcutaneous Q6H  . mouth rinse  15 mL Mouth Rinse BID  . metoprolol tartrate  2.5 mg Intravenous Q8H  . rifaximin  550 mg Oral BID  . sodium chloride flush  10-40 mL Intracatheter Q12H  . sodium chloride flush  5 mL Intracatheter Q8H  . traZODone  100 mg Oral QHS    Continuous Infusions: . heparin 1,900 Units/hr (01/27/18 1410)  . piperacillin-tazobactam (ZOSYN)  IV Stopped (01/27/18 0925)  . TPN ADULT (ION) 65 mL/hr at 01/26/18 1744  . TPN ADULT (ION)       LOS: 8 days     Annita Brod, MD [facility] Pager [phone]  If 7PM-7AM, please contact night-coverage [email or web address] Password [facility] 01/27/2018, 2:28 PM

## 2018-01-28 ENCOUNTER — Inpatient Hospital Stay (HOSPITAL_COMMUNITY): Payer: Medicare Other

## 2018-01-28 DIAGNOSIS — D72829 Elevated white blood cell count, unspecified: Secondary | ICD-10-CM

## 2018-01-28 LAB — CBC
HCT: 24.4 % — ABNORMAL LOW (ref 36.0–46.0)
HEMOGLOBIN: 8 g/dL — AB (ref 12.0–15.0)
MCH: 35.4 pg — AB (ref 26.0–34.0)
MCHC: 32.8 g/dL (ref 30.0–36.0)
MCV: 108 fL — ABNORMAL HIGH (ref 78.0–100.0)
Platelets: 163 10*3/uL (ref 150–400)
RBC: 2.26 MIL/uL — AB (ref 3.87–5.11)
RDW: 15.5 % (ref 11.5–15.5)
WBC: 30.2 10*3/uL — ABNORMAL HIGH (ref 4.0–10.5)

## 2018-01-28 LAB — HEPARIN LEVEL (UNFRACTIONATED)
HEPARIN UNFRACTIONATED: 0.28 [IU]/mL — AB (ref 0.30–0.70)
Heparin Unfractionated: 0.47 IU/mL (ref 0.30–0.70)

## 2018-01-28 LAB — PROTIME-INR
INR: 1.56
Prothrombin Time: 18.5 seconds — ABNORMAL HIGH (ref 11.4–15.2)

## 2018-01-28 LAB — URINALYSIS, ROUTINE W REFLEX MICROSCOPIC
Bilirubin Urine: NEGATIVE
Glucose, UA: NEGATIVE mg/dL
Ketones, ur: NEGATIVE mg/dL
Leukocytes, UA: NEGATIVE
Nitrite: NEGATIVE
Protein, ur: NEGATIVE mg/dL
SPECIFIC GRAVITY, URINE: 1.013 (ref 1.005–1.030)
pH: 5 (ref 5.0–8.0)

## 2018-01-28 LAB — GLUCOSE, CAPILLARY
GLUCOSE-CAPILLARY: 155 mg/dL — AB (ref 65–99)
GLUCOSE-CAPILLARY: 191 mg/dL — AB (ref 65–99)
Glucose-Capillary: 162 mg/dL — ABNORMAL HIGH (ref 65–99)

## 2018-01-28 LAB — PROCALCITONIN: Procalcitonin: 0.6 ng/mL

## 2018-01-28 LAB — URIC ACID: Uric Acid, Serum: 2.6 mg/dL (ref 2.3–6.6)

## 2018-01-28 MED ORDER — LACTULOSE 10 GM/15ML PO SOLN
30.0000 g | Freq: Two times a day (BID) | ORAL | Status: DC
  Administered 2018-01-28 – 2018-01-31 (×5): 30 g via ORAL
  Filled 2018-01-28 (×6): qty 45

## 2018-01-28 MED ORDER — VANCOMYCIN HCL IN DEXTROSE 1-5 GM/200ML-% IV SOLN
1000.0000 mg | Freq: Once | INTRAVENOUS | Status: AC
  Administered 2018-01-29: 1000 mg via INTRAVENOUS
  Filled 2018-01-28: qty 200

## 2018-01-28 MED ORDER — TRAVASOL 10 % IV SOLN
INTRAVENOUS | Status: AC
  Administered 2018-01-28: 17:00:00 via INTRAVENOUS
  Filled 2018-01-28: qty 842.4

## 2018-01-28 MED ORDER — DOCUSATE SODIUM 100 MG PO CAPS
100.0000 mg | ORAL_CAPSULE | Freq: Two times a day (BID) | ORAL | Status: DC
  Administered 2018-01-28 – 2018-01-31 (×6): 100 mg via ORAL
  Filled 2018-01-28 (×6): qty 1

## 2018-01-28 MED ORDER — VANCOMYCIN HCL 500 MG IV SOLR
500.0000 mg | Freq: Two times a day (BID) | INTRAVENOUS | Status: DC
  Administered 2018-01-29 – 2018-01-31 (×6): 500 mg via INTRAVENOUS
  Filled 2018-01-28 (×9): qty 500

## 2018-01-28 MED ORDER — RESOURCE THICKENUP CLEAR PO POWD
ORAL | Status: DC | PRN
  Filled 2018-01-28: qty 125

## 2018-01-28 NOTE — Clinical Social Work Note (Signed)
Clinical Social Work Assessment  Patient Details  Name: DELCIA Chung MRN: [id number] Date of Birth: 23-Mar-1933  Date of referral:  01/28/18               Reason for consult:  Facility Placement                Permission sought to share information with:  Facility Sport and exercise psychologist, Family Supports Permission granted to share information::  Yes, Verbal Permission Granted  Name::     Veterinary surgeon::  SNFs  Relationship::  Investment banker, corporate Information:  [phone]  Housing/Transportation Living arrangements for the past 2 months:  [place] of Information:  Patient, Adult Children, Power of Attorney Patient Interpreter Needed:  None Criminal Activity/Legal Involvement Pertinent to Current Situation/Hospitalization:  No - Comment as needed Significant Relationships:  Adult Children Lives with:  Spouse Do you feel safe going back to the place where you live?  No Need for family participation in patient care:  Yes (Comment)  Care giving concerns:  CSW received consult for possible SNF placement at time of discharge. CSW spoke with patient's step-daughter and co-HCPOA, [name] Chung. regarding PT recommendation of SNF placement at time of discharge. [name] Chung reported that patient reside at [facility] with caregivers and her spouse with dementia. [name] Chung expressed understanding of PT recommendation and is agreeable to SNF placement at time of discharge. CSW to continue to follow and assist with discharge planning needs.   Social Worker assessment / plan:  CSW spoke with patient's stepdaughter concerning possibility of rehab at [facility] before returning home. Caregiver from [facility] at bedside.   Employment status:  Retired Nurse, adult PT Recommendations:  [place] / Referral to community resources:  [place]  Patient/Family's Response to care:  Patient's stepdaugter  recognizes need for rehab before returning home and is agreeable to a SNF in [place]. Patient reported preference for [facility]. [name] Chung reported being exhausted from being at the hospital between her mother-in-law and the patient, so she is away for the weekend.   Patient/Family's Understanding of and Emotional Response to Diagnosis, Current Treatment, and Prognosis:  Patient/family is realistic regarding therapy needs and expressed being hopeful for SNF placement. Patient's stepdaughter expressed understanding of CSW role and discharge process as well as medical condition. No questions/concerns about plan or treatment.    Emotional Assessment Appearance:  Appears stated age Attitude/Demeanor/Rapport:  (Anxious) Affect (typically observed):  Appropriate, Quiet, Anxious Orientation:  Oriented to Self, Oriented to Situation, Oriented to  Time, Oriented to Place Alcohol / Substance use:  Not Applicable Psych involvement (Current and /or in the community):  No (Comment)  Discharge Needs  Concerns to be addressed:  Care Coordination Readmission within the last 30 days:  No Current discharge risk:  Dependent with Mobility Barriers to Discharge:  Continued Medical Work up   [name] Chung, [name] Chung 01/28/2018, 3:30 PM

## 2018-01-28 NOTE — Progress Notes (Signed)
Inpatient Rehabilitation  Please see consult by Dr. Naaman Plummer for full details.  SNF versus LTACH level of post acute rehab is being recommended due to tolerance.  Notified nurse case Freight forwarder and CSW.  Will sign off at this time.    Carmelia Roller., CCC/SLP Admission Coordinator  [facility]  Cell [phone]

## 2018-01-28 NOTE — Evaluation (Signed)
Clinical/Bedside Swallow Evaluation Patient Details  Name: [name] Chung MRN: [id number] Date of Birth: 05/16/1933  Today's Date: 01/28/2018 Time: SLP Start Time (ACUTE ONLY): 4315 SLP Stop Time (ACUTE ONLY): 1357 SLP Time Calculation (min) (ACUTE ONLY): 13 min  Past Medical History:  Past Medical History:  Diagnosis Date  . Aortic insufficiency   . Arthritis   . Chronic anticoagulation   . Chronic atrial fibrillation ([place])   . Edema   . Rheumatic heart disease   . Valvular heart disease    Past Surgical History:  Past Surgical History:  Procedure Laterality Date  . ABDOMINAL HYSTERECTOMY    . AORTIC VALVE REPLACEMENT     1982, redo 1995 homograft, redo 2012 Medronic valve conduit-freestyle  . BACK SURGERY    . CATARACT EXTRACTION W/ INTRAOCULAR LENS  IMPLANT, BILATERAL    . 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 Dx with partial SBO, abscess of appendix; s/p percutaneous drain. Noted to have some dysphagia, likely chronic; SLP consulted. Spoke with stepdaughter via phone - she reports increasing difficulty the last few weeks swallowing pills; occasional regurgitation. Latest CXR reveals Increasing perihilar and lower lobe opacities, right greater than left and interstitial prominence, favor edema/CHF.   Assessment / Plan / Recommendation Clinical Impression  Pt presents with functional oropharyngeal swallow given limited assessment restricted to thin liquids.  There is good oral attention/ labial seal; appearance of brief swalllow response; no overt s/s of aspiration after sequential sips of liquid (3 oz) from a straw.  No regurgitation, no c/o globus. No obvious s/s of  dysphagia are present today. Recommend continuing liquid diet; when able, crush meds in puree given description of difficulty per stepdaughter.  SLP services to sign off - please refer again if s/s of dysphagia recur.   SLP Visit Diagnosis: Dysphagia, unspecified (R13.10)    Aspiration Risk  No limitations    Diet Recommendation   clear liquid diet per MD recommendations  Medication Administration: Crushed with puree    Other  Recommendations Oral Care Recommendations: Oral care BID   Follow up Recommendations        Frequency and Duration            Prognosis        Swallow Study   General HPI: 47 year oldfemale with past medical history of chronic atrial fibrillation, mechanical atrial and mitral valves on Coumadin, chronic diastolic heart failure and stage III chronic kidney disease admitted on 4/17 after presenting with 3 days of nausea, vomiting and abdominal distention. Dx with partial SBO, abscess of appendix; s/p percutaneous drain. Noted to have some dysphagia, likely chronic; SLP consulted. Stepdaughter reports increasing difficulty the last few weeks swallowing pills; occasional regurgitation. Latest XCR reveals Increasing perihilar and lower lobe opacities, right greater than left and interstitial prominence, favor edema/CHF. Type of Study: Bedside Swallow Evaluation Previous Swallow Assessment: no Diet Prior to this Study: Thin liquids Temperature Spikes Noted: No Respiratory Status: Room air History of Recent Intubation: No Behavior/Cognition: Alert;Cooperative Oral Cavity Assessment: Within Functional Limits Oral Care Completed by SLP: No Oral Cavity - Dentition: Adequate natural dentition Vision: Functional for self-feeding Self-Feeding Abilities: Able to feed self Patient Positioning: Upright in bed Baseline Vocal  Quality: Normal Volitional Cough: Strong Volitional Swallow: Able to elicit    Oral/Motor/Sensory Function Overall Oral Motor/Sensory Function:  Within functional limits   Ice Chips Ice chips: Within functional limits   Thin Liquid Thin Liquid: Within functional limits    Nectar Thick Nectar Thick Liquid: Not tested   Honey Thick Honey Thick Liquid: Not tested   Puree Puree: Not tested   Solid   GO   Solid: Not tested        Juan Quam Laurice 01/28/2018,2:10 PM

## 2018-01-28 NOTE — Progress Notes (Signed)
Physical Therapy Treatment Patient Details Name: [name] Chung MRN: [id number] DOB: 01-08-1933 Today's Date: 01/28/2018    History of Present Illness 84yo female presenting with nausea, vomiting, abdominal pain, diarrhea, abdominal distension. CT positive for partial SBO, also diagnosed with sepsis, abdominal drain placed. PMH OA, aortic insufficiency, A-fib, rheumatic heart disease, hx aortic valve replacement, hx mitral valve replacement     PT Comments    Pt agreeable to participate, tolerating a short session, but very inhibited by pain. Strength in legs declines quickly with good A/ROM in first 2-3 reps of exercise and then quick need for max-total assist of limb to finish the set of 10 reps. Attempted to reposition patient at end of session to improved posturing, but not tolerating mild changes to elevated of knees or HOB. Updated DC recs from CIR to STR, as patient will not be able to tolerate 3 hours daily of rehab. Will continue to follow acutely.    Follow Up Recommendations  SNF;Supervision/Assistance - 24 hour(unlikely she will tolerate 3 hours/day of rehab.)     Equipment Recommendations  None recommended by PT    Recommendations for Other Services       Precautions / Restrictions Precautions Precautions: Fall;Other (comment) Precaution Comments: Painful/sensitive Left Ankle; LEE bilat  Restrictions Weight Bearing Restrictions: No    Mobility  Bed Mobility               General bed mobility comments: not agreeable to attempt this session d/t 9/10 pain in the ABD  Transfers                 General transfer comment: not agreeable to attempt this session d/t 9/10 pain in the ABD  Ambulation/Gait                 Stairs             Wheelchair Mobility    Modified Rankin (Stroke Patients Only)       Balance                                            Cognition Arousal/Alertness: (drowsy ) Behavior During  Therapy: [facility] for tasks assessed/performed;Anxious Overall Cognitive Status: Within Functional Limits for tasks assessed                                        Exercises Other Exercises Other Exercises: BLE Supine Leg exercises in bed with AA/ROM to allow pain control and make effort appropriate: Ankle pumps x10, Abdct x10, Adduction x10, heel slides x10, Quad/glute set x10 (right only). Other Exercises: Cervical Rotation: 1x10 bilat (limited toward the right side)  Other Exercises: Cervical retraction into pillow: 1x10x3secH (VC for chin tuck)     General Comments        Pertinent Vitals/Pain Pain Assessment: 0-10 Pain Score: 9  Pain Location: ABD Pain Descriptors / Indicators: Grimacing;Guarding Pain Intervention(s): Limited activity within patient's tolerance;Repositioned;Patient requesting pain meds-RN notified    Home Living                      Prior Function            PT Goals (current goals can now be found in the care plan section) Acute Rehab PT Goals  Patient Stated Goal: to get well, return to PLOF  PT Goal Formulation: With patient/family Time For Goal Achievement: 02/10/18 Potential to Achieve Goals: Fair Progress towards PT goals: Not progressing toward goals - comment    Frequency    Min 3X/week      PT Plan Discharge plan needs to be updated    Co-evaluation              AM-PAC PT "6 Clicks" Daily Activity  Outcome Measure  Difficulty turning over in bed (including adjusting bedclothes, sheets and blankets)?: Unable Difficulty moving from lying on back to sitting on the side of the bed? : Unable Difficulty sitting down on and standing up from a chair with arms (e.g., wheelchair, bedside commode, etc,.)?: Unable Help needed moving to and from a bed to chair (including a wheelchair)?: A Lot Help needed walking in hospital room?: Total Help needed climbing 3-5 steps with a railing? : Total 6 Click Score: 7    End  of Session Equipment Utilized During Treatment: Oxygen Activity Tolerance: Patient limited by fatigue;Patient tolerated treatment well;Patient limited by pain Patient left: with call bell/phone within reach;with family/visitor present;in bed Nurse Communication: Mobility status PT Visit Diagnosis: Unsteadiness on feet (R26.81);Muscle weakness (generalized) (M62.81);Difficulty in walking, not elsewhere classified (R26.2)     Time: 2500-3704 PT Time Calculation (min) (ACUTE ONLY): 23 min  Charges:  $Therapeutic Exercise: 23-37 mins                    G Codes:       2:32 PM, 02/12/18 Etta Grandchild, PT, DPT Physical Therapist - [place] [phone] (Pager)  [phone] (Office)      Karmyn Lowman C Feb 12, 2018, 2:30 PM

## 2018-01-28 NOTE — Plan of Care (Signed)

## 2018-01-28 NOTE — NC FL2 (Addendum)
[place] LEVEL OF CARE SCREENING TOOL     IDENTIFICATION  Patient Name: [name] Chung Birthdate: May 01, 1933 Sex: female Admission Date (Current Location): 01/23/2018  [facility] and [place] Number:  Herbalist and Address:  [facility] [place], [place] [address] [place], [place] [address]      Provider Number: [id number]  Attending Physician Name and Address:  Annita Brod, MD  Relative Name and Phone Number:       Current Level of Care: Hospital Recommended Level of Care: [place] Prior Approval Number:    Date Approved/Denied:   PASRR Number: [id number]  Discharge Plan: SNF    Current Diagnoses: Patient Active Problem List   Diagnosis Date Noted  . Partial small bowel obstruction ([place]) 01/16/2018  . Acute renal failure superimposed on stage 3 chronic kidney disease ([place]) 01/03/2018  . Atrial fibrillation with RVR ([place]) 01/23/2018  . Chronic systolic (congestive) heart failure ([place]) 01/24/2018  . Hyponatremia 01/25/2018  . Sepsis ([place]) 01/09/2018  . Liver cirrhosis ([place]) 01/10/2018  . Edema 03/06/2014  . Encounter for therapeutic drug monitoring 10/31/2013  . S/P MVR (mitral valve replacement) 09/14/2013  . S/P AVR (aortic valve replacement) 09/14/2013  . Heart valve replaced by other means 02/03/2011  . Long term current use of anticoagulant therapy 02/03/2011  . Aortic valve disorders 01/06/2011  . Elevated BP 01/06/2011  . Valvular heart disease   . Chronic atrial fibrillation ([place])   . Chronic anticoagulation   . Rheumatic heart disease   . Aortic insufficiency   . Arthritis     Orientation RESPIRATION BLADDER Height & Weight     Self, Time, Situation, Place  O2(Nasal cannula 2L) Continent Weight: 61.7 kg (136 lb) Height:  5' 5.5" (166.4 cm)  BEHAVIORAL SYMPTOMS/MOOD NEUROLOGICAL BOWEL NUTRITION STATUS      Continent Diet(Please see DC Summary)  AMBULATORY STATUS COMMUNICATION OF NEEDS Skin    Extensive Assist Verbally                         Personal Care Assistance Level of Assistance  Bathing, Feeding, Dressing Bathing Assistance: Maximum assistance Feeding assistance: Independent Dressing Assistance: Limited assistance     Functional Limitations Info             SPECIAL CARE FACTORS FREQUENCY  PT (By licensed PT), OT (By licensed OT)     PT Frequency: 5x/week OT Frequency: 3x/week            Contractures      Additional Factors Info  Code Status, Allergies, Psychotropic, Insulin Sliding Scale Code Status Info: Full Allergies Info: Asa Buff (Mag Buffered Aspirin, Codeine, Mirtazapine, Ultram Tramadol Hcl Psychotropic Info: Trazadone Insulin Sliding Scale Info: Every 6 hours       Current Medications (01/28/2018):  This is the current hospital active medication list Current Facility-Administered Medications  Medication Dose Route Frequency Provider Last Rate Last Dose  . acetaminophen (TYLENOL) tablet 650 mg  650 mg Oral Q6H PRN Ivor Costa, MD   650 mg at 01/21/18 0841   Or  . acetaminophen (TYLENOL) suppository 650 mg  650 mg Rectal Q6H PRN Ivor Costa, MD      . digoxin (LANOXIN) tablet 0.0625 mg  0.0625 mg Oral Daily Vernell Leep D, MD   0.0625 mg at 01/28/18 1005  . feeding supplement (BOOST / RESOURCE BREEZE) liquid 1 Container  1 Container Oral BID BM Annita Brod, MD      .  furosemide (LASIX) injection 20 mg  20 mg Intravenous BID Dorothy Spark, MD   20 mg at 01/28/18 0827  . heparin ADULT infusion 100 units/mL (25000 units/222mL sodium chloride 0.45%)  2,150 Units/hr Intravenous Continuous Bajbus, Lauren D, RPH 21.5 mL/hr at 01/28/18 1004 2,150 Units/hr at 01/28/18 1004  . hydrALAZINE (APRESOLINE) injection 5 mg  5 mg Intravenous Q2H PRN Ivor Costa, MD      . insulin aspart (novoLOG) injection 0-9 Units  0-9 Units Subcutaneous Q6H Tyrone Apple, [facility]   2 Units at 01/28/18 1735  . MEDLINE mouth rinse  15 mL Mouth Rinse BID  Modena Jansky, MD   15 mL at 01/28/18 1005  . metoprolol tartrate (LOPRESSOR) injection 2.5 mg  2.5 mg Intravenous Cleophas Dunker, MD   2.5 mg at 01/28/18 0535  . morphine 2 MG/ML injection 1 mg  1 mg Intravenous Q2H PRN Modena Jansky, MD   1 mg at 01/28/18 0827  . ondansetron (ZOFRAN) injection 4 mg  4 mg Intravenous Q8H PRN Ivor Costa, MD   4 mg at 01/25/18 1825  . piperacillin-tazobactam (ZOSYN) IVPB 3.375 g  3.375 g Intravenous Q8H Joselyn Glassman A, RPH   Stopped at 01/28/18 6701  . rifaximin (XIFAXAN) tablet 550 mg  550 mg Oral BID Ivor Costa, MD   550 mg at 01/28/18 1004  . sodium chloride flush (NS) 0.9 % injection 10-40 mL  10-40 mL Intracatheter Q12H Modena Jansky, MD   10 mL at 01/27/18 2126  . sodium chloride flush (NS) 0.9 % injection 10-40 mL  10-40 mL Intracatheter PRN Modena Jansky, MD   20 mL at 01/26/18 0349  . sodium chloride flush (NS) 0.9 % injection 5 mL  5 mL Intracatheter Q8H Hoss, Arthur, MD   5 mL at 01/28/18 0535  . TPN ADULT (ION)   Intravenous Continuous TPN Harvel Quale, [facility] 65 mL/hr at 01/27/18 1718    . TPN ADULT (ION)   Intravenous Continuous TPN Reginia Naas, RPH      . traZODone (DESYREL) tablet 100 mg  100 mg Oral QHS Ivor Costa, MD   100 mg at 01/27/18 2125  . zolpidem (AMBIEN) tablet 5 mg  5 mg Oral QHS PRN Ivor Costa, MD   5 mg at 01/21/18 2147     Discharge Medications: Please see discharge summary for a list of discharge medications.  Relevant Imaging Results:  Relevant Lab Results:   Additional Information SSN: [id number]  She has a JP Drain in her right abdomen  Benard Halsted, LCSWA

## 2018-01-28 NOTE — Progress Notes (Signed)
ANTICOAGULATION CONSULT NOTE - Follow Up Consult  Pharmacy Consult for Heparin Indication: atrial fibrillation and mechanical AVR & MVR  Patient Measurements: Height: 5' 5.5" (166.4 cm) Weight: 136 lb (61.7 kg) IBW/kg (Calculated) : 58.15 Heparin Dosing Weight: 58 kg  Vital Signs: Temp: 97.5 F (36.4 C) (04/26 0800) Temp Source: Oral (04/26 0800) BP: 127/41 (04/26 0727) Pulse Rate: 95 (04/26 0727)  Labs: Recent Labs    01/26/18 0419  01/27/18 0234 01/27/18 1316 01/27/18 2149 01/28/18 0446  HGB 8.9*  --  8.0*  --   --  8.0*  HCT 26.3*  --  24.0*  --   --  24.4*  PLT 195  --  158  --   --  163  LABPROT 22.0*  --  18.3*  --   --  18.5*  INR 1.94  --  1.53  --   --  1.56  HEPARINUNFRC 0.14*   < > 0.24* 0.14* 0.27* 0.28*  CREATININE 0.94  --  0.92  --   --   --    < > = values in this interval not displayed.    Estimated Creatinine Clearance: 42.6 mL/min (by C-G formula based on SCr of 0.92 mg/dL).  Assessment: 84 yr old female on warfarin prior to admission for mechanical AVR & MVR and atrial fibrillation. Warfarin has been held due to elevated INR and need for procedures. Patient has received vitamin K and FFP to reverse INR.  INR currently 1.56. She is being bridged with heparin infusion- levels have been difficult to get in range as she is requiring high infusion rates. This morning heparin level is slightly below goal at 0.28 units/mL on heparin infusion at 2000 units/hr. Heparin is running through PICC line d/t loss of PIV access. CBC is relatively stable, no bleeding noted.  Goal of Therapy:  Heparin level 0.3-0.7 units/ml Monitor platelets by anticoagulation protocol: Yes   Plan:  Increase heparin to 2150 units/hr Heparin level in 8hours Daily heparin level, CBC, and INR   Shernita Rabinovich D. Kairi Harshbarger, PharmD, BCPS Clinical Pharmacist Clinical Phone for 01/28/2018 until 3:30pm: [phone] If after 3:30pm, please call main pharmacy at [phone] 01/28/2018 8:41 AM

## 2018-01-28 NOTE — Progress Notes (Signed)
ANTICOAGULATION CONSULT NOTE - Follow Up Consult  Pharmacy Consult for Heparin Indication: afib and mechanical valve  Allergies  Allergen Reactions  . Asa Buff (Mag [Buffered Aspirin] Other (See Comments)    On coumadin  . Codeine   . Mirtazapine Nausea Only  . Ultram [Tramadol Hcl]     Patient Measurements: Height: 5' 5.5" (166.4 cm) Weight: 136 lb (61.7 kg) IBW/kg (Calculated) : 58.15 Heparin Dosing Weight: 64 kg  Vital Signs: Temp: 98.4 F (36.9 C) (04/26 2019) Temp Source: Oral (04/26 2019) BP: 139/62 (04/26 1527) Pulse Rate: 99 (04/26 1527)  Labs: Recent Labs    01/26/18 0419  01/27/18 0234  01/27/18 2149 01/28/18 0446 01/28/18 1931  HGB 8.9*  --  8.0*  --   --  8.0*  --   HCT 26.3*  --  24.0*  --   --  24.4*  --   PLT 195  --  158  --   --  163  --   LABPROT 22.0*  --  18.3*  --   --  18.5*  --   INR 1.94  --  1.53  --   --  1.56  --   HEPARINUNFRC 0.14*   < > 0.24*   < > 0.27* 0.28* 0.47  CREATININE 0.94  --  0.92  --   --   --   --    < > = values in this interval not displayed.    Estimated Creatinine Clearance: 42.6 mL/min (by C-G formula based on SCr of 0.92 mg/dL).  Assessment: Anticoag: warfarin PTA for afib and mechanical valve. Heparin level low earlier today after being turned off for arm swelling. Then resumed at 1400. Heparin level this PM 0.27 almost in goal range.  Goal of Therapy:  Heparin level 0.3-0.7 units/ml Monitor platelets by anticoagulation protocol: Yes   Plan:  Continue IV heparin to 2000 units/hr Recheck heparin level and CBC in AM   Corinda Gubler, PharmD 01/28/2018,8:54 PM

## 2018-01-28 NOTE — Progress Notes (Addendum)
Pharmacy Antibiotic Note  [name] Chung is a 84 y.o. female admitted on 01/31/2018 with sepsis.  Pharmacy has been consulted for Zosyn and vancomycin dosing.  AKI on admission, now resolved.  Drain placed 4/24- no growth from culture, but with purulent output.   WBC continues to increase, temps have been low over the past 24h. Adding vancomycin for suspected PNA.  Plan: Zosyn 3.375g IV q8h (4 hour infusion) Vancomycin 1g IV x1, then 500mg  IV q12h Monitor clinical progress, c/s, renal function, level PRN Follow for changes to antibiotics/LOT   Height: 5' 5.5" (166.4 cm) Weight: 136 lb (61.7 kg) IBW/kg (Calculated) : 58.15  Temp (24hrs), Avg:98.3 F (36.8 C), Min:97.3 F (36.3 C), Max:99.4 F (37.4 C)  Recent Labs  Lab 01/23/18 0642 01/24/18 0412 01/25/18 0502 01/26/18 0419 01/27/18 0234 01/28/18 0446  WBC 22.2* 20.4* 22.0* 27.0* 22.8* 30.2*  CREATININE 1.12* 0.99 0.94 0.94 0.92  --     Estimated Creatinine Clearance: 42.6 mL/min (by C-G formula based on SCr of 0.92 mg/dL).    Allergies  Allergen Reactions  . Asa Buff (Mag [Buffered Aspirin] Other (See Comments)    On coumadin  . Codeine   . Mirtazapine Nausea Only  . Ultram [Tramadol Hcl]    Zosyn 4/18>>  Vancomycin 4/26>>  4/24 abscess: ngtd 4/21 MRSA PCR: neg 4/18 BCx: neg 4/17 Bcx x1: neg 4/17 urine: insig growth  Arieon Corcoran D. Kadin Bera, PharmD, BCPS Clinical Pharmacist Clinical Phone for 01/28/2018 until 3:30pm: [phone] If after 3:30pm, please call main pharmacy at [phone] 01/28/2018 8:38 AM

## 2018-01-28 NOTE — Progress Notes (Signed)
Subjective: She reports feeling a little better today, just weak. No nausea last night. Has a bowel movement recorded. Continues to have pain.  Objective: Vital signs in last 24 hours: Temp:  [97.3 F (36.3 C)-99.4 F (37.4 C)] 99.4 F (37.4 C) (04/26 0026) Pulse Rate:  [97-104] 103 (04/25 2215) Resp:  [16] 16 (04/25 2215) BP: (120-148)/(45-57) 120/45 (04/25 2215) SpO2:  [94 %-100 %] 94 % (04/25 2215) Weight:  [61.7 kg (136 lb)] 61.7 kg (136 lb) (04/26 0500) Last BM Date: 01/28/18  Intake/Output from previous day: 04/25 0701 - 04/26 0700 In: 1989.6 [P.O.:666; I.V.:1203.6; IV Piggyback:100] Out: 9798 [Urine:1750; Drains:35] Intake/Output this shift: No intake/output data recorded.  General appearance: Alert.  Comfortable.  Resp: clear to auscultation bilaterally GI: Tender with guarding across lower abdomen, more distended than yesterday. Drain output more thin  Lab Results:  Recent Labs    01/27/18 0234 01/28/18 0446  WBC 22.8* 30.2*  HGB 8.0* 8.0*  HCT 24.0* 24.4*  PLT 158 163   BMET Recent Labs    01/26/18 0419 01/27/18 0234  NA 146* 136  K 4.2 3.9  CL 102 99*  CO2 40* 29  GLUCOSE 144* 177*  BUN 19 23*  CREATININE 0.94 0.92  CALCIUM 7.7* 7.9*   PT/INR Recent Labs    01/27/18 0234 01/28/18 0446  LABPROT 18.3* 18.5*  INR 1.53 1.56   ABG No results for input(s): PHART, HCO3 in the last 72 hours.  Invalid input(s): PCO2, PO2  Studies/Results: Ct Image Guided Drainage By Percutaneous Catheter  Result Date: 01/26/2018 INDICATION: Right lower quadrant abscess EXAM: CT GUIDED DRAINAGE OF RIGHT LOWER QUADRANT ABSCESS MEDICATIONS: The patient is currently admitted to the hospital and receiving intravenous antibiotics. The antibiotics were administered within an appropriate time frame prior to the initiation of the procedure. ANESTHESIA/SEDATION: One mg IV Versed 50 mcg IV Fentanyl Moderate Sedation Time:  11 minutes The patient was continuously monitored  during the procedure by the interventional radiology nurse under my direct supervision. COMPLICATIONS: None immediate. TECHNIQUE: Informed written consent was obtained from the patient after a thorough discussion of the procedural risks, benefits and alternatives. All questions were addressed. Maximal Sterile Barrier Technique was utilized including caps, mask, sterile gowns, sterile gloves, sterile drape, hand hygiene and skin antiseptic. A timeout was performed prior to the initiation of the procedure. PROCEDURE: The right lower quadrant was prepped with ChloraPrep in a sterile fashion, and a sterile drape was applied covering the operative field. A sterile gown and sterile gloves were used for the procedure. Local anesthesia was provided with 1% Lidocaine. Under CT guidance, an 18 gauge needle was inserted into the right lower quadrant abscess. After aspirating pus, the needle was removed over an Amplatz wire. Ten [place] dilator followed by a 10 [place] drain were inserted. It was looped and string fixed in the fluid collection. It was then sewn to the skin. FINDINGS: Images document placement of a 10 French drain into a right lower quadrant abscess cavity. IMPRESSION: Successful 10 French right lower quadrant abscess drainage. Electronically Signed   By: Marybelle Killings M.D.   On: 01/26/2018 16:57    Anti-infectives: Anti-infectives (From admission, onward)   Start     Dose/Rate Route Frequency Ordered Stop   01/22/18 1600  piperacillin-tazobactam (ZOSYN) IVPB 3.375 g     3.375 g 12.5 mL/hr over 240 Minutes Intravenous Every 8 hours 01/22/18 1014     01/20/18 1500  piperacillin-tazobactam (ZOSYN) IVPB 2.25 g  Status:  Discontinued  2.25 g 100 mL/hr over 30 Minutes Intravenous Every 6 hours 01/20/18 1211 01/22/18 1014   01/20/18 0500  piperacillin-tazobactam (ZOSYN) IVPB 2.25 g  Status:  Discontinued     2.25 g 100 mL/hr over 30 Minutes Intravenous Every 6 hours 01/12/2018 2220 01/20/18 1211    01/06/2018 2330  rifaximin (XIFAXAN) tablet 550 mg     550 mg Oral 2 times daily 01/20/2018 2224     01/18/2018 2230  piperacillin-tazobactam (ZOSYN) IVPB 3.375 g     3.375 g 100 mL/hr over 30 Minutes Intravenous  Once 01/15/2018 2220 01/03/2018 2300      Assessment/Plan:  Perforated appendicitis with abscess versus neoplasia.  Radiology favors appendicitis. Significant leukocytosis IV antibiotics - zosyn  Status post MVR and AVR on Coumadin Currently on heparin drip with INR 1.56  S/p perc drain 4/24- purulent output  She reports feeling better, but her increased distention and increased white blood cell count are worrisome. Continue current care. Will continue clears for comfort, continue TPN. She remains an exceptionally high risk candidate for significant morbidity or mortality if she does proceed to needing a laparotomy.   LOS: 9 days    Clovis Riley 01/28/2018

## 2018-01-28 NOTE — Progress Notes (Signed)
PHARMACY - ADULT TOTAL PARENTERAL NUTRITION CONSULT NOTE   Pharmacy Consult:  TPN Indication:  Prolonged ileus  Patient Measurements: Height: 5' 5.5" (166.4 cm) Weight: 136 lb (61.7 kg) IBW/kg (Calculated) : 58.15 TPN AdjBW (KG): 64 Body mass index is 22.29 kg/m.  Assessment:  55 YOF presented on 01/17/2018 with N/V/D and abdominal pain, found to have pSBO.  She was placed on a clear liquid diet on 01/21/18, which her caregiver reported that she didn't eat much.  01/22/18 CT concerning for perforated appendicitis and patient may need surgery; therefore, she was made NPO again.  Pharmacy consulted to initiate TPN due to prolonged npo status.  GI: Cirrhosis with portal venous HTN. Prealbumin <5. Albumin low at 1.9. Percent of CLD was 25% yesterday. Last BM was 4/26.   rifaximin, PRN Zofran Endo: no hx DM, CBGs controlled (150-180s) Insulin requirements in the past 24 hours: 8 units of SSI Lytes: wnl yesterday. CoCa 9.5. Mg 1.9 but give replacement Renal: SCr stable, UOP 1.2 ml/kg/hr Pulm: 2 L [place] Cards: Rheumatic heart dz - BP controlled, previously tachy in Afib, now controlled on digoxin, Lasix, metoprolol AC: warfarin PTA for Afib / mech valves >> heparin gtt  INR 1.5 -rec'd Vit K yesterday,- hgb 8, plts WNL Hepatobil: LFTs WNL, tbili 1.2  Neuro: trazodone, PRN morphine ID: Zosyn for appendiceal abscess/enteritis - afebrile, WBC up to 30.2. TPN Access: PICC placed 01/23/18 TPN start date: 01/23/18  Nutritional Goals (per RD 4/25): 1365-1575 kCal 81-93 g protein per day 1.5-1.6 L fluid/day  Current Nutrition:  Clear liquid diet TPN  Plan:  Continue TPN at 69ml/hr This TPN provides 84 g of protein, 250 g of dextrose, and 37.5 g of lipids for a total of 1,560 kcals meeting 100% of patient needs Electrolytes in TPN: standard concentration exc more K; Cl:Ac 1:1 Add MVI, trace elements to TPN.  Stop thiamine Continue sensitive SSI and adjust as needed Monitor TPN labs, Bmet  tomorrow F/U ability to tolerate diet  Boost breeze ordered but has not had any   Reginia Naas 01/28/2018 9:23 AM

## 2018-01-28 NOTE — Progress Notes (Signed)
PROGRESS NOTE  EVIN CHIRCO ZOX:[id number] DOB: Dec 16, 1932 DOA: 01/03/2018 PCP: Lajean Manes, MD  HPI/Recap of past 74 hours: 84 year old female with past medical history of chronic atrial fibrillation, mechanical atrial and mitral valves on Coumadin, chronic diastolic heart failure and stage III chronic kidney disease admitted on 4/17 after presenting with 3 days of nausea, vomiting and abdominal distention and had recent treatment for UTI.  In emergency room, patient had a CT suggestive of small bowel obstruction.  General surgery consulted and when conservative measures failed, they recommended follow-up CT of the abdomen which noted perforated appendix with abscess.  Patient's INR reversed with vitamin K and she underwent by interventional radiology a CT-guided percutaneous drainage of the abscess.  Cardiology during her hospitalization has been consulted for decompensated CHF and A. fib with RVR.    As per her stepdaughter, occasionally does have problems with dysphagia, but passed swallow evaluation by speech therapy without difficulty.  White count further increased today, but no fever.  Urine unremarkable.  Chest x-ray notes lateral pleural effusions.  Patient has not had a bowel movement in over a week.  Her main complaint is in severe pain on the medial aspect of her left foot near the malleoli region.  It is quite painful to touch.  No other complaints.  Assessment/Plan: Principal Problem:   Partial small bowel obstruction ([place]) patient denies any shortness of breath, complains of abdominal soreness. Active Problems:   Chronic atrial fibrillation (HCC)   Long term current use of anticoagulant therapy   S/P MVR (mitral valve replacement)   S/P AVR (aortic valve replacement)   Acute renal failure superimposed on stage 3 chronic kidney disease (HCC)   Atrial fibrillation with RVR (HCC)   Chronic systolic (congestive) heart failure (HCC)   Hyponatremia   Sepsis (HCC)   Liver  cirrhosis (HCC)  Complex appendiceal abscess initially presenting as possible small bowel obstruction: Status post drain.  White blood cell count trending downward.  Appreciate general surgery and interventional radiology help.  Continue IV Zosyn, monitoring closely.  On TPN per pharmacy.  Leukocytosis: Unclear etiology.  As high as 30 now.  Urine does not look to be a source.  Procalcitonin level pending to ensure no underlying pneumonia underneath fluid on x-ray.  Seen by speech therapy and cleared for signs of aspiration.  Question bacterial translocation brought on by prolonged constipation?  Have started Colace and lactulose. No sign of any cellulitis.  Nevertheless, given such a high white blood cell count, I am concerned about her overall stability so I have added vancomycin.  Constipation: As above.  Have started lactulose.  This should also help with any elevated ammonia as patient has been noncompliant but that  Left foot pain: Unusual.  Quite tender and more around the left malleolar area although the foot itself is swollen from edema and anasarca.  No heel or toe pain and patient is quite specific where the pain is.  No surrounding erythema or induration.?  Gout so checking uric acid level.  Chronic A-fib/MVR/AVR: Appreciate cardiology help.  On IV Lopressor and digoxin.  On heparin bridge with Coumadin held.  Heart rate controlled.  Chads 2 score of 5.  Acute on chronic diastolic heart failure: Echocardiogram done November 2018 notes normal ejection fraction.  On IV Lasix and has diuresed over 4 L.  Creatinine remained stable.  Hypokalemia: Replacing.  Cirrhosis with portal hypertension: Outpatient GI follow-up.  No signs of bleeding.  Stable.  Stepdaughter notes the patient  is not able to take lactulose, but her mentation appears stable at this time.  No reason to check ammonia level unless she becomes confused.  Anemia of chronic disease: Secondary to advanced liver disease with  hemoglobin remains stable.  Fortunately, no signs of bleeding.  Dysphagia: Cleared by speech therapy  Disposition: Skilled nursing versus long-term acute care facility eventually.  Waiting for white blood cell count to improve and better diuresed plus cleared by surgery   Code Status: Full  Family Communication: Stepdaughter at the bedside   Consultants:  General surgery  Cardiology  IR  Physical medicine and rehab  Procedures:  appendiceal abscess drain tube placement 4/24  Antimicrobials:  IV zosyn 4/17-present  DVT prophylaxis:  SCDs   Objective: Vitals:   01/28/18 0800 01/28/18 1212 01/28/18 1422 01/28/18 1630  BP:      Pulse:   (!) 108   Resp:      Temp: (!) 97.5 F (36.4 C) 98.5 F (36.9 C)  98.2 F (36.8 C)  TempSrc: Oral   Oral  SpO2:   94%   Weight:      Height:        Intake/Output Summary (Last 24 hours) at 01/28/2018 1804 Last data filed at 01/28/2018 1300 Gross per 24 hour  Intake 1378.57 ml  Output 1265 ml  Net 113.57 ml   Filed Weights   01/26/18 0620 01/27/18 0645 01/28/18 0500  Weight: 63.5 kg (140 lb) 64 kg (141 lb) 61.7 kg (136 lb)    Exam:   General: Alert and oriented x3, mild distress from left foot pain  HEENT: Normocephalic and atraumatic, mucous memories are dry  Neck: Supple, mild JVD  Lungs: Minimal expiratory wheezing  Cardiovascular: Irregular rhythm, rate controlled  Abdomen: Soft, mild distention, noted drain, few bowel sounds  Extremities: No clubbing or cyanosis, 1-2+ pitting edema from the knees down.  Left foot is extremely tender in the left malleolar area with even mild touching her removing her sock.  No signs of erythema or induration.    Neuro: No focal deficits  Skin: No skin breaks, tears or lesions  Psychiatric: Patient is appropriate, no evidence of psychosis   Data Reviewed: CBC: Recent Labs  Lab 01/24/18 0412 01/25/18 0502 01/26/18 0419 01/27/18 0234 01/28/18 0446  WBC 20.4*  22.0* 27.0* 22.8* 30.2*  NEUTROABS 15.7*  --  21.9*  --   --   HGB 8.9* 8.8* 8.9* 8.0* 8.0*  HCT 25.8* 26.5* 26.3* 24.0* 24.4*  MCV 103.6* 105.6* 106.5* 107.6* 108.0*  PLT 212 207 195 158 979   Basic Metabolic Panel: Recent Labs  Lab 01/22/18 0601 01/23/18 0642 01/24/18 0412 01/25/18 0502 01/26/18 0419 01/27/18 0234  NA 131* 130* 134* 136 146* 136  K 3.4* 3.8 2.8* 3.1* 4.2 3.9  CL 98* 99* 98* 100* 102 99*  CO2 24 23 21* 30 40* 29  GLUCOSE 111* 112* 138* 152* 144* 177*  BUN 29* 23* 18 18 19  23*  CREATININE 1.29* 1.12* 0.99 0.94 0.94 0.92  CALCIUM 7.7* 7.6* 7.3* 7.7* 7.7* 7.9*  MG 1.6* 1.9 1.7 1.9  --  1.9  PHOS  --  2.5 3.0  --  2.2* 3.0   GFR: Estimated Creatinine Clearance: 42.6 mL/min (by C-G formula based on SCr of 0.92 mg/dL). Liver Function Tests: Recent Labs  Lab 01/24/18 0412 01/26/18 0419 01/27/18 0234  AST 32  --  30  ALT 13*  --  12*  ALKPHOS 72  --  70  BILITOT  1.4*  --  1.2  PROT 5.1*  --  5.6*  ALBUMIN 1.7* 1.6* 1.9*   No results for input(s): LIPASE, AMYLASE in the last 168 hours. No results for input(s): AMMONIA in the last 168 hours. Coagulation Profile: Recent Labs  Lab 01/24/18 0412 01/25/18 0502 01/26/18 0419 01/27/18 0234 01/28/18 0446  INR 2.09 1.78 1.94 1.53 1.56   Cardiac Enzymes: No results for input(s): CKTOTAL, CKMB, CKMBINDEX, TROPONINI in the last 168 hours. BNP (last 3 results) No results for input(s): PROBNP in the last 8760 hours. HbA1C: No results for input(s): HGBA1C in the last 72 hours. CBG: Recent Labs  Lab 01/27/18 1304 01/27/18 1822 01/28/18 0002 01/28/18 0544 01/28/18 1211  GLUCAP 180* 151* 162* 155* 191*   Lipid Profile: No results for input(s): CHOL, HDL, LDLCALC, TRIG, CHOLHDL, LDLDIRECT in the last 72 hours. Thyroid Function Tests: No results for input(s): TSH, T4TOTAL, FREET4, T3FREE, THYROIDAB in the last 72 hours. Anemia Panel: No results for input(s): VITAMINB12, FOLATE, FERRITIN, TIBC, IRON,  RETICCTPCT in the last 72 hours. Urine analysis:    Component Value Date/Time   COLORURINE YELLOW 01/28/2018 0938   APPEARANCEUR CLEAR 01/28/2018 0938   APPEARANCEUR Clear 08/17/2017 1457   LABSPEC 1.013 01/28/2018 0938   PHURINE 5.0 01/28/2018 0938   GLUCOSEU NEGATIVE 01/28/2018 0938   HGBUR SMALL (A) 01/28/2018 0938   BILIRUBINUR NEGATIVE 01/28/2018 0938   BILIRUBINUR Negative 08/17/2017 1457   KETONESUR NEGATIVE 01/28/2018 0938   PROTEINUR NEGATIVE 01/28/2018 0938   NITRITE NEGATIVE 01/28/2018 0938   LEUKOCYTESUR NEGATIVE 01/28/2018 0938   LEUKOCYTESUR Negative 08/17/2017 1457   Sepsis Labs: @LABRCNTIP (procalcitonin:4,lacticidven:4)  ) Recent Results (from the past 240 hour(s))  Urine Culture     Status: Abnormal   Collection Time: 01/10/2018 10:19 PM  Result Value Ref Range Status   Specimen Description URINE, RANDOM  Final   Special Requests NONE  Final   Culture (A)  Final    <10,000 COLONIES/mL INSIGNIFICANT GROWTH Performed at [facility], [place] [address], [place], [place] [address]    Report Status 01/21/2018 FINAL  Final  Culture, blood (x 2)     Status: None   Collection Time: 01/22/2018 10:46 PM  Result Value Ref Range Status   Specimen Description BLOOD RIGHT ANTECUBITAL  Final   Special Requests   Final    BOTTLES DRAWN AEROBIC AND ANAEROBIC Blood Culture adequate volume   Culture   Final    NO GROWTH 5 DAYS Performed at [facility], [place] [address] [place], [place] [address]    Report Status 01/25/2018 FINAL  Final  Culture, blood (Routine X 2) w Reflex to ID Panel     Status: None   Collection Time: 01/20/18 12:24 PM  Result Value Ref Range Status   Specimen Description BLOOD RIGHT HAND  Final   Special Requests   Final    BOTTLES DRAWN AEROBIC AND ANAEROBIC Blood Culture adequate volume   Culture   Final    NO GROWTH 5 DAYS Performed at [facility], [place] [address], [place], [place] [address]    Report Status 01/25/2018 FINAL   Final  Culture, blood (Routine X 2) w Reflex to ID Panel     Status: None   Collection Time: 01/20/18  8:24 PM  Result Value Ref Range Status   Specimen Description BLOOD RIGHT ARM  Final   Special Requests   Final    BOTTLES DRAWN AEROBIC ONLY Blood Culture adequate volume  Culture   Final    NO GROWTH 5 DAYS Performed at [facility], [place] [address] [place], [place] [address]    Report Status 01/25/2018 FINAL  Final  MRSA PCR Screening     Status: None   Collection Time: 01/23/18  5:18 AM  Result Value Ref Range Status   MRSA by PCR NEGATIVE NEGATIVE Final    Comment:        The GeneXpert MRSA Assay (FDA approved for NASAL specimens only), is one component of a comprehensive MRSA colonization surveillance program. It is not intended to diagnose MRSA infection nor to guide or monitor treatment for MRSA infections. Performed at [facility], [place] [address], [place], [place] [address]   Aerobic/Anaerobic Culture (surgical/deep wound)     Status: None (Preliminary result)   Collection Time: 01/26/18  4:38 PM  Result Value Ref Range Status   Specimen Description ABSCESS ABDOMEN  Final   Special Requests NONE  Final   Gram Stain   Final    MODERATE WBC PRESENT, PREDOMINANTLY PMN NO ORGANISMS SEEN    Culture   Final    CULTURE REINCUBATED FOR BETTER GROWTH HOLDING FOR POSSIBLE ANAEROBE Performed at [facility], [place] [address] [place], [place] [address]    Report Status PENDING  Incomplete      Studies: Dg Chest Port 1 View  Result Date: 01/28/2018 CLINICAL DATA:  Shortness of breath, hypertension. Question aspiration. EXAM: PORTABLE CHEST 1 VIEW COMPARISON:  01/23/2018 FINDINGS: Right PICC line remains in place, unchanged. Cardiomegaly. Prior median sternotomy. Vascular congestion. Moderate layering right pleural effusion. Perihilar and lower lobe airspace opacities have increased since prior study with increasing interstitial prominence, favor edema.  No visible left effusion. No acute bony abnormality. IMPRESSION: Moderate layering right pleural effusion. Cardiomegaly. Increasing perihilar and lower lobe opacities, right greater than left and interstitial prominence, favor edema/CHF. Electronically Signed   By: Rolm Baptise M.D.   On: 01/28/2018 11:06    Scheduled Meds: . digoxin  0.0625 mg Oral Daily  . docusate sodium  100 mg Oral BID  . feeding supplement  1 Container Oral BID BM  . furosemide  20 mg Intravenous BID  . insulin aspart  0-9 Units Subcutaneous Q6H  . lactulose  30 g Oral BID  . mouth rinse  15 mL Mouth Rinse BID  . metoprolol tartrate  2.5 mg Intravenous Q8H  . rifaximin  550 mg Oral BID  . sodium chloride flush  10-40 mL Intracatheter Q12H  . sodium chloride flush  5 mL Intracatheter Q8H  . traZODone  100 mg Oral QHS    Continuous Infusions: . heparin 2,150 Units/hr (01/28/18 1742)  . piperacillin-tazobactam (ZOSYN)  IV Stopped (01/28/18 1718)  . TPN ADULT (ION) 65 mL/hr at 01/28/18 1720  . [START ON 01/29/2018] vancomycin    . vancomycin       LOS: 9 days     Annita Brod, MD [facility] Pager [phone]  If 7PM-7AM, please contact night-coverage [email or web address] Password [facility] 01/28/2018, 6:04 PM

## 2018-01-28 NOTE — Progress Notes (Signed)
Progress Note  Patient Name: MADELON WELSCH Date of Encounter: 01/28/2018  Primary Cardiologist: Vonnie Ligman [place], MD  Subjective   Very weak,  States abdominal pain really no better since drain placed.    Inpatient Medications    Scheduled Meds: . digoxin  0.0625 mg Oral Daily  . feeding supplement  1 Container Oral BID BM  . furosemide  20 mg Intravenous BID  . insulin aspart  0-9 Units Subcutaneous Q6H  . mouth rinse  15 mL Mouth Rinse BID  . metoprolol tartrate  2.5 mg Intravenous Q8H  . rifaximin  550 mg Oral BID  . sodium chloride flush  10-40 mL Intracatheter Q12H  . sodium chloride flush  5 mL Intracatheter Q8H  . traZODone  100 mg Oral QHS   Continuous Infusions: . heparin 2,150 Units/hr (01/28/18 1004)  . piperacillin-tazobactam (ZOSYN)  IV Stopped (01/28/18 0934)  . TPN ADULT (ION) 65 mL/hr at 01/27/18 1718  . TPN ADULT (ION)     PRN Meds: acetaminophen **OR** acetaminophen, hydrALAZINE, morphine injection, ondansetron (ZOFRAN) IV, sodium chloride flush, zolpidem   Vital Signs    Vitals:   01/28/18 0026 01/28/18 0500 01/28/18 0727 01/28/18 0800  BP:   (!) 127/41   Pulse:   95   Resp:   19   Temp: 99.4 F (37.4 C)   (!) 97.5 F (36.4 C)  TempSrc:    Oral  SpO2:   96%   Weight:  136 lb (61.7 kg)    Height:        Intake/Output Summary (Last 24 hours) at 01/28/2018 1040 Last data filed at 01/28/2018 6295 Gross per 24 hour  Intake 1989.57 ml  Output 1785 ml  Net 204.57 ml   Filed Weights   01/26/18 0620 01/27/18 0645 01/28/18 0500  Weight: 140 lb (63.5 kg) 141 lb (64 kg) 136 lb (61.7 kg)    Telemetry    AFib Rate 80-110 - Personally Reviewed  ECG    N/a  - Personally Reviewed  Physical Exam   General: Thin frail older W female appearing ill Head: Normocephalic, atraumatic.  Neck: Supple, no JVD. Lungs:  Resp regular and unlabored, Clear anteriorly. Heart: Irreg Irreg, S1, S2, soft systolic murmur with good mechanical valve AV and MV  clicks; no rub. Abdomen: Soft, distended, normoactive bowel sounds. Extremities: No clubbing, cyanosis, edema. Feet are warm and dry. No swelling or erythema. Support hose removed. Neuro: Alert and oriented X 3. Moves all extremities spontaneously. Psych: Normal affect.  Labs    Chemistry Recent Labs  Lab 01/24/18 0412 01/25/18 0502 01/26/18 0419 01/27/18 0234  NA 134* 136 146* 136  K 2.8* 3.1* 4.2 3.9  CL 98* 100* 102 99*  CO2 21* 30 40* 29  GLUCOSE 138* 152* 144* 177*  BUN 18 18 19  23*  CREATININE 0.99 0.94 0.94 0.92  CALCIUM 7.3* 7.7* 7.7* 7.9*  PROT 5.1*  --   --  5.6*  ALBUMIN 1.7*  --  1.6* 1.9*  AST 32  --   --  30  ALT 13*  --   --  12*  ALKPHOS 72  --   --  70  BILITOT 1.4*  --   --  1.2  GFRNONAA 51* 55* 55* 56*  GFRAA 59* >60 >60 >60  ANIONGAP 15 6 4* 8     Hematology Recent Labs  Lab 01/26/18 0419 01/27/18 0234 01/28/18 0446  WBC 27.0* 22.8* 30.2*  RBC 2.47* 2.23* 2.26*  HGB 8.9*  8.0* 8.0*  HCT 26.3* 24.0* 24.4*  MCV 106.5* 107.6* 108.0*  MCH 36.0* 35.9* 35.4*  MCHC 33.8 33.3 32.8  RDW 14.5 15.0 15.5  PLT 195 158 163    Cardiac EnzymesNo results for input(s): TROPONINI in the last 168 hours. No results for input(s): TROPIPOC in the last 168 hours.   BNP No results for input(s): BNP, PROBNP in the last 168 hours.   DDimer No results for input(s): DDIMER in the last 168 hours.    Radiology    Ct Image Guided Drainage By Percutaneous Catheter  Result Date: 01/26/2018 INDICATION: Right lower quadrant abscess EXAM: CT GUIDED DRAINAGE OF RIGHT LOWER QUADRANT ABSCESS MEDICATIONS: The patient is currently admitted to the hospital and receiving intravenous antibiotics. The antibiotics were administered within an appropriate time frame prior to the initiation of the procedure. ANESTHESIA/SEDATION: One mg IV Versed 50 mcg IV Fentanyl Moderate Sedation Time:  11 minutes The patient was continuously monitored during the procedure by the interventional  radiology nurse under my direct supervision. COMPLICATIONS: None immediate. TECHNIQUE: Informed written consent was obtained from the patient after a thorough discussion of the procedural risks, benefits and alternatives. All questions were addressed. Maximal Sterile Barrier Technique was utilized including caps, mask, sterile gowns, sterile gloves, sterile drape, hand hygiene and skin antiseptic. A timeout was performed prior to the initiation of the procedure. PROCEDURE: The right lower quadrant was prepped with ChloraPrep in a sterile fashion, and a sterile drape was applied covering the operative field. A sterile gown and sterile gloves were used for the procedure. Local anesthesia was provided with 1% Lidocaine. Under CT guidance, an 18 gauge needle was inserted into the right lower quadrant abscess. After aspirating pus, the needle was removed over an Amplatz wire. Ten [place] dilator followed by a 10 [place] drain were inserted. It was looped and string fixed in the fluid collection. It was then sewn to the skin. FINDINGS: Images document placement of a 10 French drain into a right lower quadrant abscess cavity. IMPRESSION: Successful 10 French right lower quadrant abscess drainage. Electronically Signed   By: Marybelle Killings M.D.   On: 01/26/2018 16:57    Cardiac Studies   N/a   Patient Profile     84 y.o. female with a history ofrheumatic valvular heart disease status post redo aortic valve replacement x3 and redo mitral valve surgery x2, persistent atrial fibrillation, coumadin for anticoagulation, chronic LE edema (multifactorial with diastolic CHF, venous insufficiency, cirrhosis)now admitted with SBO. We are assisting with management of diastolic heart failure and atrial fibrillation.   Assessment & Plan    1. Rheumatic heart disease s/p AVR x3 and MVR x2: Has a [employer]. Jude mechanical mitral valve and Medtronic freestyle aortic root conduit.  -- coumadin has been held for procedures with   heparin bridge. s/p IR drainage of abscess.   Continue IV heparin for now until no further interventions needed.  2. Abd pain 2/2 to perforated appendicitis with abscess: CT scan with concern for perforated appendicitis, surgery following s/p drain placement. WBC has increased today and abdomen is distended. May end up needing laparotomy.  3. Chronic Afib: on IV lopressor and home dose Dig with good rate control. Rate controlled. Now on heparin bridge with coumadin held.   4. Acute on Chronic diastolic HF: Last echo from 11/18 with normal EF and no WMA. CXR on admission with edema and right sided pleural effusion. Remains on IV lasix. Net - 2.5 L since admit.  Cr stable.  Once able to take pos well we can transition metoprolol and lasix to po  5. Hypokalemia: repleted  Signed, Laurice Kimmons [place], MD  01/28/2018, 10:40 AM   For questions or updates, please contact [place] Please consult [email or web address] for contact info under Cardiology/STEMI.

## 2018-01-29 LAB — CBC
HEMATOCRIT: 23.3 % — AB (ref 36.0–46.0)
Hemoglobin: 7.7 g/dL — ABNORMAL LOW (ref 12.0–15.0)
MCH: 35.6 pg — AB (ref 26.0–34.0)
MCHC: 33 g/dL (ref 30.0–36.0)
MCV: 107.9 fL — ABNORMAL HIGH (ref 78.0–100.0)
PLATELETS: 175 10*3/uL (ref 150–400)
RBC: 2.16 MIL/uL — AB (ref 3.87–5.11)
RDW: 15.6 % — AB (ref 11.5–15.5)
WBC: 29.9 10*3/uL — ABNORMAL HIGH (ref 4.0–10.5)

## 2018-01-29 LAB — GLUCOSE, CAPILLARY
GLUCOSE-CAPILLARY: 147 mg/dL — AB (ref 65–99)
GLUCOSE-CAPILLARY: 167 mg/dL — AB (ref 65–99)
GLUCOSE-CAPILLARY: 212 mg/dL — AB (ref 65–99)
Glucose-Capillary: 167 mg/dL — ABNORMAL HIGH (ref 65–99)
Glucose-Capillary: 150 mg/dL — ABNORMAL HIGH (ref 65–99)

## 2018-01-29 LAB — BASIC METABOLIC PANEL
Anion gap: 7 (ref 5–15)
BUN: 31 mg/dL — AB (ref 6–20)
CO2: 31 mmol/L (ref 22–32)
Calcium: 8.1 mg/dL — ABNORMAL LOW (ref 8.9–10.3)
Chloride: 96 mmol/L — ABNORMAL LOW (ref 101–111)
Creatinine, Ser: 0.93 mg/dL (ref 0.44–1.00)
GFR calc Af Amer: >60 mL/min (ref >60)
GFR, EST NON AFRICAN AMERICAN: 55 mL/min — AB (ref >60)
Glucose, Bld: 161 mg/dL — ABNORMAL HIGH (ref 65–99)
POTASSIUM: 4.1 mmol/L (ref 3.5–5.1)
Sodium: 134 mmol/L — ABNORMAL LOW (ref 135–145)

## 2018-01-29 LAB — PROTIME-INR
INR: 1.57
PROTHROMBIN TIME: 18.6 s — AB (ref 11.4–15.2)

## 2018-01-29 LAB — PROCALCITONIN: Procalcitonin: 0.54 ng/mL

## 2018-01-29 LAB — HEPARIN LEVEL (UNFRACTIONATED): Heparin Unfractionated: 0.51 IU/mL (ref 0.30–0.70)

## 2018-01-29 MED ORDER — TRAVASOL 10 % IV SOLN
INTRAVENOUS | Status: AC
  Administered 2018-01-29: 17:00:00 via INTRAVENOUS
  Filled 2018-01-29: qty 842.4

## 2018-01-29 MED ORDER — INSULIN ASPART 100 UNIT/ML ~~LOC~~ SOLN
0.0000 [IU] | Freq: Four times a day (QID) | SUBCUTANEOUS | Status: DC
  Administered 2018-01-29: 3 [IU] via SUBCUTANEOUS
  Administered 2018-01-29: 2 [IU] via SUBCUTANEOUS
  Administered 2018-01-30 – 2018-01-31 (×7): 3 [IU] via SUBCUTANEOUS
  Administered 2018-02-01: 8 [IU] via SUBCUTANEOUS
  Administered 2018-02-01: 3 [IU] via SUBCUTANEOUS
  Administered 2018-02-01: 2 [IU] via SUBCUTANEOUS
  Administered 2018-02-02: 8 [IU] via SUBCUTANEOUS

## 2018-01-29 NOTE — Progress Notes (Signed)
ANTICOAGULATION CONSULT NOTE - Follow Up Consult  Pharmacy Consult for Heparin Indication: atrial fibrillation and mechanical AVR & MVR  Patient Measurements: Height: 5' 5.5" (166.4 cm) Weight: 136 lb (61.7 kg) IBW/kg (Calculated) : 58.15 Heparin Dosing Weight: 58 kg  Vital Signs: Temp: 98.5 F (36.9 C) (04/27 0628) Temp Source: Oral (04/27 0628) BP: 133/64 (04/27 1937) Pulse Rate: 105 (04/27 0935)  Labs: Recent Labs    01/27/18 0234  01/28/18 0446 01/28/18 1931 01/29/18 0438  HGB 8.0*  --  8.0*  --  7.7*  HCT 24.0*  --  24.4*  --  23.3*  PLT 158  --  163  --  175  LABPROT 18.3*  --  18.5*  --  18.6*  INR 1.53  --  1.56  --  1.57  HEPARINUNFRC 0.24*   < > 0.28* 0.47 0.51  CREATININE 0.92  --   --   --  0.93   < > = values in this interval not displayed.    Estimated Creatinine Clearance: 42.1 mL/min (by C-G formula based on SCr of 0.93 mg/dL).  Assessment: 84 yr old female on warfarin prior to admission for mechanical AVR & MVR and atrial fibrillation. Warfarin has been held due to elevated INR and need for procedures. Patient has received vitamin K and FFP to reverse INR.  Patient continues on heparin bridge- now therapeutic x2 on 2150 units/hr. CBC overall stable, no bleeding noted.  Goal of Therapy:  Heparin level 0.3-0.7 units/ml Monitor platelets by anticoagulation protocol: Yes   Plan:  Continue heparin at 2150 units/hr Daily heparin level, CBC, and INR   Reagan Behlke D. Arhan Mcmanamon, PharmD, BCPS Clinical Pharmacist Clinical Phone for 01/29/2018 until 3:30pm: [phone] If after 3:30pm, please call main pharmacy at [phone] 01/29/2018 10:28 AM

## 2018-01-29 NOTE — Progress Notes (Signed)
PROGRESS NOTE    [name] Chung  SWN:[id number] DOB: Mar 03, 1933 DOA: 01/26/2018 PCP: Lajean Manes, MD   Brief Narrative: Patient is a 84 year old female with past medical history of chronic atrial fibrillation, mechanical atrial and mitral valves on Coumadin, chronic diastolic heart failure and stage III chronic kidney disease admitted on 4/17 after presenting with 3 days of nausea, vomiting and abdominal distention and had recent treatment for UTI.  In emergency room, patient had a CT suggestive of small bowel obstruction.  General surgery consulted and when conservative measures failed, they recommended follow-up CT of the abdomen which noted perforated appendix with abscess.  Patient's INR reversed with vitamin K and she underwent by interventional radiology a CT-guided percutaneous drainage of the abscess.  Cardiology during her hospitalization has been consulted for decompensated CHF and A. fib with RVR.  Her hospital course is complicated with severe leukocytosis, ongoing abdominal pain. She does not have  fever.  Urine unremarkable.  Chest x-ray notes lateral pleural effusions.    Assessment & Plan:   Principal Problem:   Partial small bowel obstruction (HCC) Active Problems:   Chronic atrial fibrillation (HCC)   Long term current use of anticoagulant therapy   S/P MVR (mitral valve replacement)   S/P AVR (aortic valve replacement)   Acute renal failure superimposed on stage 3 chronic kidney disease (HCC)   Atrial fibrillation with RVR (HCC)   Chronic systolic (congestive) heart failure (HCC)   Hyponatremia   Sepsis (HCC)   Liver cirrhosis (HCC)   Leukocytosis   Complex appendiceal abscess: Initially presented with possible small bowel obstruction.  Status post drainage.  Surgery following. Continue IV Zosyn and vanco.  On TPN per pharmacy.Also on clears. She might need laparotomy as per surgery.  Plan is to repeat CT scan.  Leukocytosis: Unclear etiology.  Slightly trended  down.  Urine does not look to be a source.  Procalcitonin level mildly elevated.  Seen by speech therapy and cleared for signs of aspiration.  Possible bacterial translocation? No sign of any cellulitis.    Constipation: Continue current regimen.  Patient finally had a bowel movement.  Left foot pain: Unusual.  Quite tender and more around the left lateral malleolar area. Foot itself is swollen from edema and anasarca.  No heel or toe pain and patient is quite specific where the pain is.  No surrounding erythema or induration.  Uric acid level normal  Chronic A-fib/MVR/AVR: Appreciate cardiology help.  On IV Lopressor and digoxin.  On heparin bridge with Coumadin held.  Heart rate controlled.  Chads 2 score of 5.  Acute on chronic diastolic heart failure: Echocardiogram done November 2018 notes normal ejection fraction.  On IV Lasix and has diuresed over 4 L.  Creatinine remained stable.  Hypokalemia: Supplemented and corrected  Cirrhosis with portal hypertension: Outpatient GI follow-up.  No signs of bleeding.  Stable.  Stepdaughter notes the patient is not able to take lactulose, but her mentation appears stable at this time.  No reason to check ammonia level unless she becomes confused.  Anemia of chronic disease: Secondary to advanced liver disease.No signs of bleeding.  We will continue to monitor her CBC  Dysphagia: Cleared by speech therapy  Disposition: Skilled nursing versus long-term acute care facility eventually.  Waiting for white blood cell count to improve and better diuresed plus cleared by surgery.She might eventually need surgery   DVT prophylaxis: Heparin drip Code Status: Full Family Communication: Caregiver on the bedside Disposition Plan: Needs to be determined  Consultants: General surgery, cardiology, IR  Procedures: Appendiceal abscess drain tube placement on 4/24  Antimicrobials: IV Zosyn since 4/17 Vancomycin since 4/26  Subjective: Patient  seen and examined the bedside this morning.  Remains weak.  Complains of left-sided foot pain and abdominal discomfort.  Hemodynamically stable.  Objective: Vitals:   01/29/18 0628 01/29/18 0900 01/29/18 0935 01/29/18 1044  BP:    129/74  Pulse:  (!) 109 (!) 105 (!) 102  Resp:  19  20  Temp: 98.5 F (36.9 C)   98 F (36.7 C)  TempSrc: Oral   Oral  SpO2:  95%  94%  Weight:      Height:        Intake/Output Summary (Last 24 hours) at 01/29/2018 1515 Last data filed at 01/29/2018 1429 Gross per 24 hour  Intake 2497.65 ml  Output 1405 ml  Net 1092.65 ml   Filed Weights   01/26/18 0620 01/27/18 0645 01/28/18 0500  Weight: 63.5 kg (140 lb) 64 kg (141 lb) 61.7 kg (136 lb)    Examination:  General exam: Appears calm and comfortable ,in mild distress due to left foot pain,average built HEENT:PERRL,Oral mucosa moist, Ear/Nose normal on gross exam Respiratory system: Bilateral decreased air entry in the bases Cardiovascular system: S1 & S2 heard, irregularly irregular. No JVD, murmurs, rubs, gallops or clicks. No pedal edema. Gastrointestinal system: Abdomen is distended, mild generalized tenderness. No organomegaly or masses felt.  Sluggish bowel sounds. Central nervous system: Alert and oriented. No focal neurological deficits. Extremities: 1-2+ pitting  edema, no clubbing ,no cyanosis, distal peripheral pulses palpable.  Tender left foot in the lateral malleolar area, no signs of erythema or induration Skin: No ulcers,no icterus ,no pallor   Data Reviewed: I have personally reviewed following labs and imaging studies  CBC: Recent Labs  Lab 01/24/18 0412 01/25/18 0502 01/26/18 0419 01/27/18 0234 01/28/18 0446 01/29/18 0438  WBC 20.4* 22.0* 27.0* 22.8* 30.2* 29.9*  NEUTROABS 15.7*  --  21.9*  --   --   --   HGB 8.9* 8.8* 8.9* 8.0* 8.0* 7.7*  HCT 25.8* 26.5* 26.3* 24.0* 24.4* 23.3*  MCV 103.6* 105.6* 106.5* 107.6* 108.0* 107.9*  PLT 212 207 195 158 163 779   Basic  Metabolic Panel: Recent Labs  Lab 01/23/18 0642 01/24/18 0412 01/25/18 0502 01/26/18 0419 01/27/18 0234 01/29/18 0438  NA 130* 134* 136 146* 136 134*  K 3.8 2.8* 3.1* 4.2 3.9 4.1  CL 99* 98* 100* 102 99* 96*  CO2 23 21* 30 40* 29 31  GLUCOSE 112* 138* 152* 144* 177* 161*  BUN 23* 18 18 19  23* 31*  CREATININE 1.12* 0.99 0.94 0.94 0.92 0.93  CALCIUM 7.6* 7.3* 7.7* 7.7* 7.9* 8.1*  MG 1.9 1.7 1.9  --  1.9  --   PHOS 2.5 3.0  --  2.2* 3.0  --    GFR: Estimated Creatinine Clearance: 42.1 mL/min (by C-G formula based on SCr of 0.93 mg/dL). Liver Function Tests: Recent Labs  Lab 01/24/18 0412 01/26/18 0419 01/27/18 0234  AST 32  --  30  ALT 13*  --  12*  ALKPHOS 72  --  70  BILITOT 1.4*  --  1.2  PROT 5.1*  --  5.6*  ALBUMIN 1.7* 1.6* 1.9*   No results for input(s): LIPASE, AMYLASE in the last 168 hours. No results for input(s): AMMONIA in the last 168 hours. Coagulation Profile: Recent Labs  Lab 01/25/18 0502 01/26/18 3903 01/27/18 0234 01/28/18 0092 01/29/18 3300  INR 1.78 1.94 1.53 1.56 1.57   Cardiac Enzymes: No results for input(s): CKTOTAL, CKMB, CKMBINDEX, TROPONINI in the last 168 hours. BNP (last 3 results) No results for input(s): PROBNP in the last 8760 hours. HbA1C: No results for input(s): HGBA1C in the last 72 hours. CBG: Recent Labs  Lab 01/28/18 0544 01/28/18 1211 01/29/18 0037 01/29/18 0537 01/29/18 1226  GLUCAP 155* 191* 212* 167* 167*   Lipid Profile: No results for input(s): CHOL, HDL, LDLCALC, TRIG, CHOLHDL, LDLDIRECT in the last 72 hours. Thyroid Function Tests: No results for input(s): TSH, T4TOTAL, FREET4, T3FREE, THYROIDAB in the last 72 hours. Anemia Panel: No results for input(s): VITAMINB12, FOLATE, FERRITIN, TIBC, IRON, RETICCTPCT in the last 72 hours. Sepsis Labs: Recent Labs  Lab 01/28/18 1721 01/29/18 0438  PROCALCITON 0.60 0.54    Recent Results (from the past 240 hour(s))  Urine Culture     Status: Abnormal    Collection Time: 01/08/2018 10:19 PM  Result Value Ref Range Status   Specimen Description URINE, RANDOM  Final   Special Requests NONE  Final   Culture (A)  Final    <10,000 COLONIES/mL INSIGNIFICANT GROWTH Performed at [facility], [place] [address] [place], [place] [address]    Report Status 01/21/2018 FINAL  Final  Culture, blood (x 2)     Status: None   Collection Time: 01/23/2018 10:46 PM  Result Value Ref Range Status   Specimen Description BLOOD RIGHT ANTECUBITAL  Final   Special Requests   Final    BOTTLES DRAWN AEROBIC AND ANAEROBIC Blood Culture adequate volume   Culture   Final    NO GROWTH 5 DAYS Performed at [facility], [place] [address] [place], [place] [address]    Report Status 01/25/2018 FINAL  Final  Culture, blood (Routine X 2) w Reflex to ID Panel     Status: None   Collection Time: 01/20/18 12:24 PM  Result Value Ref Range Status   Specimen Description BLOOD RIGHT HAND  Final   Special Requests   Final    BOTTLES DRAWN AEROBIC AND ANAEROBIC Blood Culture adequate volume   Culture   Final    NO GROWTH 5 DAYS Performed at [facility], [place] [address], [place], [place] [address]    Report Status 01/25/2018 FINAL  Final  Culture, blood (Routine X 2) w Reflex to ID Panel     Status: None   Collection Time: 01/20/18  8:24 PM  Result Value Ref Range Status   Specimen Description BLOOD RIGHT ARM  Final   Special Requests   Final    BOTTLES DRAWN AEROBIC ONLY Blood Culture adequate volume   Culture   Final    NO GROWTH 5 DAYS Performed at [facility], [place] [address], [place], [place] [address]    Report Status 01/25/2018 FINAL  Final  MRSA PCR Screening     Status: None   Collection Time: 01/23/18  5:18 AM  Result Value Ref Range Status   MRSA by PCR NEGATIVE NEGATIVE Final    Comment:        The GeneXpert MRSA Assay (FDA approved for NASAL specimens only), is one component of a comprehensive MRSA colonization surveillance program.  It is not intended to diagnose MRSA infection nor to guide or monitor treatment for MRSA infections. Performed at [facility], [place] [address] [place],  [address]   Aerobic/Anaerobic Culture (surgical/deep wound)     Status: None (Preliminary  result)   Collection Time: 01/26/18  4:38 PM  Result Value Ref Range Status   Specimen Description ABSCESS ABDOMEN  Final   Special Requests NONE  Final   Gram Stain   Final    MODERATE WBC PRESENT, PREDOMINANTLY PMN NO ORGANISMS SEEN    Culture   Final    FEW ACTINOMYCES ODONTOLYTICUS Standardized susceptibility testing for this organism is not available. HOLDING FOR POSSIBLE ANAEROBE Performed at [facility], [place] [address] [place], [place] [address]    Report Status PENDING  Incomplete         Radiology Studies: Dg Chest Port 1 View  Result Date: 01/28/2018 CLINICAL DATA:  Shortness of breath, hypertension. Question aspiration. EXAM: PORTABLE CHEST 1 VIEW COMPARISON:  01/23/2018 FINDINGS: Right PICC line remains in place, unchanged. Cardiomegaly. Prior median sternotomy. Vascular congestion. Moderate layering right pleural effusion. Perihilar and lower lobe airspace opacities have increased since prior study with increasing interstitial prominence, favor edema. No visible left effusion. No acute bony abnormality. IMPRESSION: Moderate layering right pleural effusion. Cardiomegaly. Increasing perihilar and lower lobe opacities, right greater than left and interstitial prominence, favor edema/CHF. Electronically Signed   By: Rolm Baptise M.D.   On: 01/28/2018 11:06   Dg Abd Acute W/chest  Result Date: 01/29/2018 CLINICAL DATA:  Abdominal pain and distention today. Nausea and vomiting. EXAM: DG ABDOMEN ACUTE W/ 1V CHEST COMPARISON:  Chest 01/28/2018.  CT abdomen and pelvis 01/22/2018 FINDINGS: Postoperative changes in the mediastinum. Right PICC line with tip over the cavoatrial junction region. No pneumothorax. Cardiac  enlargement with mild vascular congestion. Mild perihilar edema. Bilateral pleural effusions with basilar atelectasis, greater on the right. Similar appearance to previous study. Calcification of the aorta. Multiple dilated gas-filled small bowel loops with mostly decompressed colon. Changes suggest small bowel obstruction. Residual contrast material in the colon. No free intra-abdominal air. Right lower quadrant drainage catheter. Degenerative changes in the spine. No radiopaque stones. IMPRESSION: Congestive changes in the heart and lungs with cardiac enlargement, pulmonary vascular congestion, mild perihilar edema, and bilateral pleural effusions and basilar atelectasis, greater on the right. Dilated gas-filled small bowel with nondilated colon suggesting small bowel obstruction. Electronically Signed   By: Lucienne Capers M.D.   On: 01/29/2018 00:33        Scheduled Meds: . digoxin  0.0625 mg Oral Daily  . docusate sodium  100 mg Oral BID  . feeding supplement  1 Container Oral BID BM  . furosemide  20 mg Intravenous BID  . insulin aspart  0-15 Units Subcutaneous Q6H  . lactulose  30 g Oral BID  . mouth rinse  15 mL Mouth Rinse BID  . metoprolol tartrate  2.5 mg Intravenous Q8H  . rifaximin  550 mg Oral BID  . sodium chloride flush  10-40 mL Intracatheter Q12H  . sodium chloride flush  5 mL Intracatheter Q8H  . traZODone  100 mg Oral QHS   Continuous Infusions: . heparin 2,150 Units/hr (01/29/18 0624)  . piperacillin-tazobactam (ZOSYN)  IV 3.375 g (01/29/18 1037)  . TPN ADULT (ION) 65 mL/hr at 01/28/18 1720  . TPN ADULT (ION)    . vancomycin Stopped (01/29/18 1137)     LOS: 10 days    Time spent: 25 mins.More than 50% of that time was spent in counseling and/or coordination of care.      Shelly Coss, MD [facility] Pager [phone]  If 7PM-7AM, please contact night-coverage [email or web address] Password [facility] 01/29/2018, 3:15 PM

## 2018-01-29 NOTE — Progress Notes (Signed)
Subjective: She is she states she is not much better However, she has no pain at rest and looks a good deal less stressed than 3 days ago.  Having loose bowel movements.  No vomiting. X-rays yesterday showed congestive changes of the heart and lungs were cardiomegaly pulmonary vascular congestion and perihilar edema and bilateral pleural effusions.  Abdominal film shows dilated gas-filled small bowel and nondilated colon suggesting partial small bowel obstruction. Hemoglobin 7.7.  WBC 29.9 creatinine 0.93.  BUN 31.  INR 1.57.   On heparin drip   Objective: Vital signs in last 24 hours: Temp:  [98 F (36.7 C)-99 F (37.2 C)] 98 F (36.7 C) (04/27 1044) Pulse Rate:  [99-128] 102 (04/27 1044) Resp:  [16-25] 20 (04/27 1044) BP: (108-139)/(45-74) 129/74 (04/27 1044) SpO2:  [93 %-95 %] 94 % (04/27 1044) Last BM Date: 01/28/18  Intake/Output from previous day: 04/26 0701 - 04/27 0700 In: 62 [P.O.:60] Out: 1405 [Urine:1400; Drains:5] Intake/Output this shift: Total I/O In: 2270.7 [P.O.:100; I.V.:2020.7; IV Piggyback:150] Out: -    General appearance: Alert.  more. Comfortable.  Elderly.  Deconditioned.  Skin warm and dry Resp: clear to auscultation bilaterally GI: Tender with guarding across lower abdomen, more distended than yesterday. Drain output more thin     Lab Results:  Recent Labs    01/28/18 0446 01/29/18 0438  WBC 30.2* 29.9*  HGB 8.0* 7.7*  HCT 24.4* 23.3*  PLT 163 175   BMET Recent Labs    01/27/18 0234 01/29/18 0438  NA 136 134*  K 3.9 4.1  CL 99* 96*  CO2 29 31  GLUCOSE 177* 161*  BUN 23* 31*  CREATININE 0.92 0.93  CALCIUM 7.9* 8.1*   PT/INR Recent Labs    01/28/18 0446 01/29/18 0438  LABPROT 18.5* 18.6*  INR 1.56 1.57   ABG No results for input(s): PHART, HCO3 in the last 72 hours.  Invalid input(s): PCO2, PO2  Studies/Results: Dg Chest Port 1 View  Result Date: 01/28/2018 CLINICAL DATA:  Shortness of breath, hypertension.  Question aspiration. EXAM: PORTABLE CHEST 1 VIEW COMPARISON:  01/23/2018 FINDINGS: Right PICC line remains in place, unchanged. Cardiomegaly. Prior median sternotomy. Vascular congestion. Moderate layering right pleural effusion. Perihilar and lower lobe airspace opacities have increased since prior study with increasing interstitial prominence, favor edema. No visible left effusion. No acute bony abnormality. IMPRESSION: Moderate layering right pleural effusion. Cardiomegaly. Increasing perihilar and lower lobe opacities, right greater than left and interstitial prominence, favor edema/CHF. Electronically Signed   By: Rolm Baptise M.D.   On: 01/28/2018 11:06   Dg Abd Acute W/chest  Result Date: 01/29/2018 CLINICAL DATA:  Abdominal pain and distention today. Nausea and vomiting. EXAM: DG ABDOMEN ACUTE W/ 1V CHEST COMPARISON:  Chest 01/28/2018.  CT abdomen and pelvis 01/22/2018 FINDINGS: Postoperative changes in the mediastinum. Right PICC line with tip over the cavoatrial junction region. No pneumothorax. Cardiac enlargement with mild vascular congestion. Mild perihilar edema. Bilateral pleural effusions with basilar atelectasis, greater on the right. Similar appearance to previous study. Calcification of the aorta. Multiple dilated gas-filled small bowel loops with mostly decompressed colon. Changes suggest small bowel obstruction. Residual contrast material in the colon. No free intra-abdominal air. Right lower quadrant drainage catheter. Degenerative changes in the spine. No radiopaque stones. IMPRESSION: Congestive changes in the heart and lungs with cardiac enlargement, pulmonary vascular congestion, mild perihilar edema, and bilateral pleural effusions and basilar atelectasis, greater on the right. Dilated gas-filled small bowel with nondilated colon suggesting small bowel obstruction.  Electronically Signed   By: Lucienne Capers M.D.   On: 01/29/2018 00:33    Anti-infectives: Anti-infectives (From  admission, onward)   Start     Dose/Rate Route Frequency Ordered Stop   01/29/18 1000  vancomycin (VANCOCIN) 500 mg in sodium chloride 0.9 % 100 mL IVPB     500 mg 100 mL/hr over 60 Minutes Intravenous Every 12 hours 01/28/18 1411     01/28/18 1500  vancomycin (VANCOCIN) IVPB 1000 mg/200 mL premix     1,000 mg 200 mL/hr over 60 Minutes Intravenous  Once 01/28/18 1411 01/29/18 0102   01/22/18 1600  piperacillin-tazobactam (ZOSYN) IVPB 3.375 g     3.375 g 12.5 mL/hr over 240 Minutes Intravenous Every 8 hours 01/22/18 1014     01/20/18 1500  piperacillin-tazobactam (ZOSYN) IVPB 2.25 g  Status:  Discontinued     2.25 g 100 mL/hr over 30 Minutes Intravenous Every 6 hours 01/20/18 1211 01/22/18 1014   01/20/18 0500  piperacillin-tazobactam (ZOSYN) IVPB 2.25 g  Status:  Discontinued     2.25 g 100 mL/hr over 30 Minutes Intravenous Every 6 hours 01/04/2018 2220 01/20/18 1211   01/18/2018 2330  rifaximin (XIFAXAN) tablet 550 mg     550 mg Oral 2 times daily 01/15/2018 2224     01/13/2018 2230  piperacillin-tazobactam (ZOSYN) IVPB 3.375 g     3.375 g 100 mL/hr over 30 Minutes Intravenous  Once 01/25/2018 2220 01/10/2018 2300      Assessment/Plan:    Perforated appendicitis with abscess versus neoplasia.  Radiology favors appendicitis. Significant leukocytosis IV antibiotics - zosyn  Status post MVR and AVR on Coumadin Currently on heparin drip with INR 1.56  S/p perc drain 4/24- purulent output  She reports feeling better, but her increased distention and increased white blood cell count are worrisome.  I wonder if she is developing a small bowel obstruction secondary to the inflammatory process.  Continue current care. Will continue clears for comfort, continue TPN.  Repeat CT scan tomorrow or Monday.    She remains an exceptionally high risk candidate for significant morbidity or mortality if she does proceed to needing a laparotomy,  which may well be the case. I discussed this in detail  with her son today and he understands.     LOS: 10 days    Adin Hector 01/29/2018

## 2018-01-29 NOTE — Progress Notes (Signed)
PHARMACY - ADULT TOTAL PARENTERAL NUTRITION CONSULT NOTE   Pharmacy Consult:  TPN Indication:  Prolonged ileus  Patient Measurements: Height: 5' 5.5" (166.4 cm) Weight: 136 lb (61.7 kg) IBW/kg (Calculated) : 58.15 TPN AdjBW (KG): 64 Body mass index is 22.29 kg/m.  Assessment:  23 YOF presented on 01/03/2018 with N/V/D and abdominal pain, found to have pSBO.  She was placed on a clear liquid diet on 01/21/18.  01/22/18 CT concerning for perforated appendicitis and patient may need surgery; therefore, she was made NPO again.  Pharmacy consulted to initiate TPN due to prolonged npo status.  GI: Cirrhosis with portal venous HTN. Prealbumin <5. Albumin low at 1.9. Advanced to dysphagia 2 diet but still not eating much. Refusing boost breeze supplements. Reports still having abdominal pain and nausea. Last BM was 4/26. rifaximin, PRN Zofran Endo: No hx DM, CBGs mostly controlled but had a high of 212 yesterday (150-210s) Insulin requirements in the past 24 hours: 7 units of SSI Lytes: wnl today. CoCa 9.7. Mg 1.9 but give replacement on 4/25. Renal: SCr stable, UOP 0.9 ml/kg/hr. BUN up to 31. Pulm: 2 L [place] Cards: Rheumatic heart dz - BP controlled, previously tachy in Afib, now controlled on digoxin, Lasix, metoprolol AC: warfarin PTA for Afib / mech valves >> heparin gtt  INR 1.5 -rec'd Vit K yesterday,- hgb 8, plts WNL Hepatobil: LFTs WNL, tbili 1.2  Neuro: trazodone, PRN morphine ID: Zosyn for appendiceal abscess/enteritis. Abdominal drain with minimal output yesterday. Afebrile, WBC up to 29.9 TPN Access: PICC placed 01/23/18 TPN start date: 01/23/18  Nutritional Goals (per RD 4/25): 1365-1575 kCal 81-93 g protein per day 1.5-1.6 L fluid/day  Current Nutrition:  Dysphagia 2 diet (0-25% of meal intake charted) TPN  Plan:  Continue TPN at 40ml/hr This TPN provides 84 g of protein, 250 g of dextrose, and 37.5 g of lipids for a total of 1,560 kcals meeting 100% of patient  needs Electrolytes in TPN: standard concentration exc more K; Cl:Ac 1:1 Add MVI, trace elements to TPN Change to moderate SSI and adjust as needed Monitor TPN labs F/U ability to tolerate diet / boost breeze supplements   Reginia Naas 01/29/2018 7:37 AM

## 2018-01-30 ENCOUNTER — Inpatient Hospital Stay (HOSPITAL_COMMUNITY): Payer: Medicare Other

## 2018-01-30 ENCOUNTER — Encounter (HOSPITAL_COMMUNITY): Payer: Self-pay | Admitting: Radiology

## 2018-01-30 LAB — CBC WITH DIFFERENTIAL/PLATELET
Basophils Absolute: 0 10*3/uL (ref 0.0–0.1)
Basophils Relative: 0 %
EOS PCT: 1 %
Eosinophils Absolute: 0.3 10*3/uL (ref 0.0–0.7)
HEMATOCRIT: 21.6 % — AB (ref 36.0–46.0)
HEMOGLOBIN: 7.3 g/dL — AB (ref 12.0–15.0)
LYMPHS ABS: 2.8 10*3/uL (ref 0.7–4.0)
LYMPHS PCT: 9 %
MCH: 36.5 pg — AB (ref 26.0–34.0)
MCHC: 33.8 g/dL (ref 30.0–36.0)
MCV: 108 fL — ABNORMAL HIGH (ref 78.0–100.0)
MONOS PCT: 9 %
Monocytes Absolute: 2.8 10*3/uL — ABNORMAL HIGH (ref 0.1–1.0)
NEUTROS ABS: 25.4 10*3/uL — AB (ref 1.7–7.7)
Neutrophils Relative %: 81 %
Platelets: 174 10*3/uL (ref 150–400)
RBC: 2 MIL/uL — ABNORMAL LOW (ref 3.87–5.11)
RDW: 16.2 % — ABNORMAL HIGH (ref 11.5–15.5)
WBC: 31.3 10*3/uL — ABNORMAL HIGH (ref 4.0–10.5)

## 2018-01-30 LAB — BASIC METABOLIC PANEL
Anion gap: 5 (ref 5–15)
BUN: 36 mg/dL — AB (ref 6–20)
CHLORIDE: 99 mmol/L — AB (ref 101–111)
CO2: 31 mmol/L (ref 22–32)
Calcium: 8 mg/dL — ABNORMAL LOW (ref 8.9–10.3)
Creatinine, Ser: 0.97 mg/dL (ref 0.44–1.00)
GFR calc non Af Amer: 53 mL/min — ABNORMAL LOW (ref >60)
Glucose, Bld: 147 mg/dL — ABNORMAL HIGH (ref 65–99)
POTASSIUM: 4.5 mmol/L (ref 3.5–5.1)
Sodium: 135 mmol/L (ref 135–145)

## 2018-01-30 LAB — GLUCOSE, CAPILLARY
GLUCOSE-CAPILLARY: 158 mg/dL — AB (ref 65–99)
GLUCOSE-CAPILLARY: 167 mg/dL — AB (ref 65–99)
GLUCOSE-CAPILLARY: 205 mg/dL — AB (ref 65–99)
Glucose-Capillary: 161 mg/dL — ABNORMAL HIGH (ref 65–99)
Glucose-Capillary: 158 mg/dL — ABNORMAL HIGH (ref 65–99)

## 2018-01-30 LAB — PREPARE RBC (CROSSMATCH)

## 2018-01-30 LAB — PROTIME-INR
INR: 1.71
PROTHROMBIN TIME: 19.9 s — AB (ref 11.4–15.2)

## 2018-01-30 LAB — PROCALCITONIN: Procalcitonin: 0.62 ng/mL

## 2018-01-30 LAB — HEPARIN LEVEL (UNFRACTIONATED): Heparin Unfractionated: 0.68 IU/mL (ref 0.30–0.70)

## 2018-01-30 MED ORDER — IOPAMIDOL (ISOVUE-300) INJECTION 61%
INTRAVENOUS | Status: AC
  Filled 2018-01-30: qty 100

## 2018-01-30 MED ORDER — M.V.I. ADULT IV INJ
INJECTION | INTRAVENOUS | Status: AC
  Administered 2018-01-30: 17:00:00 via INTRAVENOUS
  Filled 2018-01-30: qty 842.4

## 2018-01-30 MED ORDER — SODIUM CHLORIDE 0.9 % IV SOLN: Freq: Once | INTRAVENOUS | Status: DC

## 2018-01-30 MED ORDER — IOPAMIDOL (ISOVUE-300) INJECTION 61%
100.0000 mL | Freq: Once | INTRAVENOUS | Status: AC | PRN
  Administered 2018-01-30: 100 mL via INTRAVENOUS

## 2018-01-30 MED ORDER — IOPAMIDOL (ISOVUE-300) INJECTION 61%
INTRAVENOUS | Status: AC
  Filled 2018-01-30: qty 30

## 2018-01-30 NOTE — Progress Notes (Addendum)
Subjective: Alert.  Smiled at me.  Probably no worse and no better.  No vomiting.  Occasional nausea.  Had a bowel movement yesterday.  Still has pain and does not feel well. Chest x-ray probably showed congestive heart failure cardiomegaly bilateral pleural effusions Abdominal x-rays Friday showed pattern suggesting SBO  Afebrile.  Heart rate 100.  Normotensive.  SPO2 93% WBC 31.3.  Hemoglobin 7.3.  Potassium 4.5.  Creatinine 0.97.   Objective: Vital signs in last 24 hours: Temp:  [98 F (36.7 C)-98.3 F (36.8 C)] 98.3 F (36.8 C) (04/28 0945) Pulse Rate:  [98-105] 105 (04/28 0945) Resp:  [16-22] 22 (04/28 0945) BP: (107-139)/(58-74) 133/64 (04/28 0945) SpO2:  [93 %-96 %] 93 % (04/28 0945) Weight:  [64.4 kg (142 lb)] 64.4 kg (142 lb) (04/28 0304) Last BM Date: 01/29/18  Intake/Output from previous day: 04/27 0701 - 04/28 0700 In: 3002.9 [P.O.:322; I.V.:2480.9; IV Piggyback:200] Out: 4403 [Urine:1350; Drains:5] Intake/Output this shift: No intake/output data recorded.   General appearance: Alert. more. Comfortable.  Elderly.    Very deconditioned.    Dependent on others for all ADLs.  Skin warm and dry Resp: clear to auscultation bilaterally.  Decreased breath sounds at bases.  No wheeze. GI: Tender with guarding across lower abdomen,more distended than yesterday. Drainoutput more thin     Lab Results:  Recent Labs    01/29/18 0438 01/30/18 0512  WBC 29.9* 31.3*  HGB 7.7* 7.3*  HCT 23.3* 21.6*  PLT 175 174   BMET Recent Labs    01/29/18 0438 01/30/18 0512  NA 134* 135  K 4.1 4.5  CL 96* 99*  CO2 31 31  GLUCOSE 161* 147*  BUN 31* 36*  CREATININE 0.93 0.97  CALCIUM 8.1* 8.0*   PT/INR Recent Labs    01/29/18 0438 01/30/18 0512  LABPROT 18.6* 19.9*  INR 1.57 1.71   ABG No results for input(s): PHART, HCO3 in the last 72 hours.  Invalid input(s): PCO2, PO2  Studies/Results: Dg Chest Port 1 View  Result Date: 01/28/2018 CLINICAL DATA:   Shortness of breath, hypertension. Question aspiration. EXAM: PORTABLE CHEST 1 VIEW COMPARISON:  01/23/2018 FINDINGS: Right PICC line remains in place, unchanged. Cardiomegaly. Prior median sternotomy. Vascular congestion. Moderate layering right pleural effusion. Perihilar and lower lobe airspace opacities have increased since prior study with increasing interstitial prominence, favor edema. No visible left effusion. No acute bony abnormality. IMPRESSION: Moderate layering right pleural effusion. Cardiomegaly. Increasing perihilar and lower lobe opacities, right greater than left and interstitial prominence, favor edema/CHF. Electronically Signed   By: Rolm Baptise M.D.   On: 01/28/2018 11:06   Dg Abd Acute W/chest  Result Date: 01/29/2018 CLINICAL DATA:  Abdominal pain and distention today. Nausea and vomiting. EXAM: DG ABDOMEN ACUTE W/ 1V CHEST COMPARISON:  Chest 01/28/2018.  CT abdomen and pelvis 01/22/2018 FINDINGS: Postoperative changes in the mediastinum. Right PICC line with tip over the cavoatrial junction region. No pneumothorax. Cardiac enlargement with mild vascular congestion. Mild perihilar edema. Bilateral pleural effusions with basilar atelectasis, greater on the right. Similar appearance to previous study. Calcification of the aorta. Multiple dilated gas-filled small bowel loops with mostly decompressed colon. Changes suggest small bowel obstruction. Residual contrast material in the colon. No free intra-abdominal air. Right lower quadrant drainage catheter. Degenerative changes in the spine. No radiopaque stones. IMPRESSION: Congestive changes in the heart and lungs with cardiac enlargement, pulmonary vascular congestion, mild perihilar edema, and bilateral pleural effusions and basilar atelectasis, greater on the right. Dilated gas-filled small  bowel with nondilated colon suggesting small bowel obstruction. Electronically Signed   By: Lucienne Capers M.D.   On: 01/29/2018 00:33     Anti-infectives: Anti-infectives (From admission, onward)   Start     Dose/Rate Route Frequency Ordered Stop   01/29/18 1000  vancomycin (VANCOCIN) 500 mg in sodium chloride 0.9 % 100 mL IVPB     500 mg 100 mL/hr over 60 Minutes Intravenous Every 12 hours 01/28/18 1411     01/28/18 1500  vancomycin (VANCOCIN) IVPB 1000 mg/200 mL premix     1,000 mg 200 mL/hr over 60 Minutes Intravenous  Once 01/28/18 1411 01/29/18 0102   01/22/18 1600  piperacillin-tazobactam (ZOSYN) IVPB 3.375 g     3.375 g 12.5 mL/hr over 240 Minutes Intravenous Every 8 hours 01/22/18 1014     01/20/18 1500  piperacillin-tazobactam (ZOSYN) IVPB 2.25 g  Status:  Discontinued     2.25 g 100 mL/hr over 30 Minutes Intravenous Every 6 hours 01/20/18 1211 01/22/18 1014   01/20/18 0500  piperacillin-tazobactam (ZOSYN) IVPB 2.25 g  Status:  Discontinued     2.25 g 100 mL/hr over 30 Minutes Intravenous Every 6 hours 01/24/2018 2220 01/20/18 1211   01/23/2018 2330  rifaximin (XIFAXAN) tablet 550 mg     550 mg Oral 2 times daily 01/13/2018 2224     01/03/2018 2230  piperacillin-tazobactam (ZOSYN) IVPB 3.375 g     3.375 g 100 mL/hr over 30 Minutes Intravenous  Once 01/15/2018 2220 01/30/2018 2300      Assessment/Plan:   Perforated appendicitis with abscess versus neoplasia. Radiology favors appendicitis. Significant leukocytosis IV antibiotics - zosyn  Status post MVR and AVR on Coumadin Currently on heparin drip with INR 1.56  S/p perc drain 4/24- purulent output  She does not seem to be getting any better.  Suspect SBO secondary to her inflammatory process.  She may require laparotomy.  This would likely result in ileocecal resection and probably permanent ileostomy. I have entered. her into the [employer] of surgeons risk calculator.  Due to MMP and profound deconditionong/age estimate  50% chance of major complications.  40 to 50% mortality risk.  70%, at least, risk of discharge to SNF.    Repeat CT scan  abdomen and pelvis with contrast today  Dr. Georgette Dover to follow up on Monday.    LOS: 11 days    Adin Hector 01/30/2018

## 2018-01-30 NOTE — Progress Notes (Signed)
Pt experienced episode of coughing up blood clots and nose bleeds twice today. Dr Tawanna Solo and [facility] notified. Vitals stable. 1U RBC started. Pt not eating well and c/o lower abdominal pain, and nausea. Stable at this time. Will continue to monitor as needed. Dr. Tawanna Solo notified that CT results have returned. He states that surgery will be following her but to let night coverage know if bleeding gets worse. [facility] will follow up as well.

## 2018-01-30 NOTE — Progress Notes (Signed)
ANTICOAGULATION CONSULT NOTE - Follow Up Consult  Pharmacy Consult for Heparin Indication: atrial fibrillation and mechanical AVR & MVR  Patient Measurements: Height: 5' 5.5" (166.4 cm) Weight: 142 lb (64.4 kg) IBW/kg (Calculated) : 58.15 Heparin Dosing Weight: 58 kg  Vital Signs: Temp: 98.3 F (36.8 C) (04/28 0945) Temp Source: Oral (04/28 0945) BP: 133/64 (04/28 0945) Pulse Rate: 105 (04/28 0945)  Labs: Recent Labs    01/28/18 0446 01/28/18 1931 01/29/18 0438 01/30/18 0512  HGB 8.0*  --  7.7* 7.3*  HCT 24.4*  --  23.3* 21.6*  PLT 163  --  175 174  LABPROT 18.5*  --  18.6* 19.9*  INR 1.56  --  1.57 1.71  HEPARINUNFRC 0.28* 0.47 0.51 0.68  CREATININE  --   --  0.93 0.97    Estimated Creatinine Clearance: 40.4 mL/min (by C-G formula based on SCr of 0.97 mg/dL).  Assessment: 84 yr old female on warfarin prior to admission for mechanical AVR & MVR and atrial fibrillation. Warfarin has been held due to elevated INR and need for procedures. Patient has received vitamin K and FFP to reverse INR.  Patient continues on heparin bridge- now therapeutic x3 on 2150 units/hr, however this morning's level is at high end of the range. CBC overall stable (though Hgb remains low), no bleeding noted.  Goal of Therapy:  Heparin level 0.3-0.7 units/ml Monitor platelets by anticoagulation protocol: Yes   Plan:  Decrease  heparin to 2050 units/hr Daily heparin level, CBC, and INR   Jaheem Hedgepath D. Lyndi Holbein, PharmD, BCPS Clinical Pharmacist Clinical Phone for 01/30/2018 until 3:30pm: [phone] If after 3:30pm, please call main pharmacy at [phone] 01/30/2018 10:27 AM

## 2018-01-30 NOTE — Progress Notes (Signed)
PROGRESS NOTE    [name] Chung  ZOX:[id number] DOB: December 08, 1932 DOA: 01/05/2018 PCP: Lajean Manes, MD   Brief Narrative: Patient is a 84 year old female with past medical history of chronic atrial fibrillation, mechanical atrial and mitral valves on Coumadin, chronic diastolic heart failure and stage III chronic kidney disease admitted on 4/17 after presenting with 3 days of nausea, vomiting and abdominal distention and had recent treatment for UTI.  In emergency room, patient had a CT suggestive of small bowel obstruction.  General surgery consulted and when conservative measures failed, they recommended follow-up CT of the abdomen which noted perforated appendix with abscess.  Patient's INR reversed with vitamin K and she underwent by interventional radiology a CT-guided percutaneous drainage of the abscess.  Cardiology during her hospitalization has been consulted for decompensated CHF and A. fib with RVR.  Her hospital course is complicated with severe leukocytosis, ongoing abdominal pain. She does not have  fever.  Urine unremarkable.  Chest x-ray notes lateral pleural effusions.  This plan is to repeat CT abdomen/pelvis with contrast .  She might require laparotomy.   Assessment & Plan:   Principal Problem:   Partial small bowel obstruction (HCC) Active Problems:   Chronic atrial fibrillation (HCC)   Long term current use of anticoagulant therapy   S/P MVR (mitral valve replacement)   S/P AVR (aortic valve replacement)   Acute renal failure superimposed on stage 3 chronic kidney disease (HCC)   Atrial fibrillation with RVR (HCC)   Chronic systolic (congestive) heart failure (HCC)   Hyponatremia   Sepsis (HCC)   Liver cirrhosis (HCC)   Leukocytosis   Complex appendiceal abscess: Initially presented with possible small bowel obstruction.  Status post drainage.  Surgery following. Continue IV Zosyn and vanco.  On TPN per pharmacy.Also on clears. She might need laparotomy as per  surgery.  Plan is to repeat CT scan today.  Leukocytosis: Unclear etiology.  Urine does not look to be a source.  Procalcitonin level mildly elevated.  Seen by speech therapy and cleared for signs of aspiration.  Possible colonic bacterial translocation? No sign of any cellulitis. Continue antibiotics.  Constipation: Continue current regimen.    Left foot pain: Unusual.  Quite tender and more around the left lateral malleolar area. Foot itself is swollen from edema and anasarca.  No heel or toe pain and patient is quite specific where the pain is.  No surrounding erythema or induration.  Uric acid level normal  Chronic A-fib/MVR/AVR: Appreciate cardiology help.  On IV Lopressor and digoxin.  On heparin bridge with Coumadin held.  Heart rate controlled.  Chads 2 score of 5.  Acute on chronic diastolic heart failure: Echocardiogram done November 2018 notes normal ejection fraction.  On IV Lasix .  Creatinine remaines stable.  Hypokalemia: Supplemented and corrected  Cirrhosis with portal hypertension: Outpatient GI follow-up.  No signs of bleeding.  Stable.  Stepdaughter notes the patient is not able to take lactulose, but her mentation appears stable at this time.  No reason to check ammonia level unless she becomes confused.  Anemia of chronic disease: Secondary to advanced liver disease.No signs of bleeding.  We will continue to monitor her CBC.  We will transfuse here with 1 unit of PRBC today.  Dysphagia: Cleared by speech therapy  Disposition: Skilled nursing versus long-term acute care facility eventually.    DVT prophylaxis: Heparin drip Code Status: Full Family Communication: Caregiver on the bedside Disposition Plan: Needs to be determined   Consultants: General surgery,  cardiology, IR  Procedures: Appendiceal abscess drain tube placement on 4/24  Antimicrobials: IV Zosyn since 4/17 Vancomycin since 4/26  Subjective: Patient seen and examined the bedside this  morning.  Complains of abdominal discomfort.  Hemodynamically stable.  Objective: Vitals:   01/30/18 0254 01/30/18 0304 01/30/18 0929 01/30/18 0945  BP: (!) 107/58   133/64  Pulse:   98 (!) 105  Resp: 16   (!) 22  Temp:    98.3 F (36.8 C)  TempSrc:    Oral  SpO2:    93%  Weight:  64.4 kg (142 lb)    Height:        Intake/Output Summary (Last 24 hours) at 01/30/2018 1248 Last data filed at 01/30/2018 1116 Gross per 24 hour  Intake 2577.74 ml  Output 1355 ml  Net 1222.74 ml   Filed Weights   01/27/18 0645 01/28/18 0500 01/30/18 0304  Weight: 64 kg (141 lb) 61.7 kg (136 lb) 64.4 kg (142 lb)    Examination:  General exam: Not in acute distress, chronically ill looking  HEENT:PERRL,Oral mucosa moist, Ear/Nose normal on gross exam Respiratory system: Bilateral decreased air entry in the bases Cardiovascular system: S1 & S2 heard, irregularly irregular. No JVD, murmurs, rubs, gallops or clicks. Pedal edema. Gastrointestinal system: Abdomen is distended, mild generalized tenderness. No organomegaly or masses felt.  Sluggish bowel sounds. Central nervous system: Alert and oriented. No focal neurological deficits. Extremities: 1-2+ pitting  edema, no clubbing ,no cyanosis, distal peripheral pulses palpable.  Tender left foot in the lateral malleolar area, no signs of erythema or induration Skin: No ulcers,no icterus ,no pallor   Data Reviewed: I have personally reviewed following labs and imaging studies  CBC: Recent Labs  Lab 01/24/18 0412  01/26/18 0419 01/27/18 0234 01/28/18 0446 01/29/18 0438 01/30/18 0512  WBC 20.4*   < > 27.0* 22.8* 30.2* 29.9* 31.3*  NEUTROABS 15.7*  --  21.9*  --   --   --  25.4*  HGB 8.9*   < > 8.9* 8.0* 8.0* 7.7* 7.3*  HCT 25.8*   < > 26.3* 24.0* 24.4* 23.3* 21.6*  MCV 103.6*   < > 106.5* 107.6* 108.0* 107.9* 108.0*  PLT 212   < > 195 158 163 175 174   < > = values in this interval not displayed.   Basic Metabolic Panel: Recent Labs  Lab  01/24/18 0412 01/25/18 0502 01/26/18 0419 01/27/18 0234 01/29/18 0438 01/30/18 0512  NA 134* 136 146* 136 134* 135  K 2.8* 3.1* 4.2 3.9 4.1 4.5  CL 98* 100* 102 99* 96* 99*  CO2 21* 30 40* 29 31 31   GLUCOSE 138* 152* 144* 177* 161* 147*  BUN 18 18 19  23* 31* 36*  CREATININE 0.99 0.94 0.94 0.92 0.93 0.97  CALCIUM 7.3* 7.7* 7.7* 7.9* 8.1* 8.0*  MG 1.7 1.9  --  1.9  --   --   PHOS 3.0  --  2.2* 3.0  --   --    GFR: Estimated Creatinine Clearance: 40.4 mL/min (by C-G formula based on SCr of 0.97 mg/dL). Liver Function Tests: Recent Labs  Lab 01/24/18 0412 01/26/18 0419 01/27/18 0234  AST 32  --  30  ALT 13*  --  12*  ALKPHOS 72  --  70  BILITOT 1.4*  --  1.2  PROT 5.1*  --  5.6*  ALBUMIN 1.7* 1.6* 1.9*   No results for input(s): LIPASE, AMYLASE in the last 168 hours. No results for input(s): AMMONIA  in the last 168 hours. Coagulation Profile: Recent Labs  Lab 01/26/18 0419 01/27/18 0234 01/28/18 0446 01/29/18 0438 01/30/18 0512  INR 1.94 1.53 1.56 1.57 1.71   Cardiac Enzymes: No results for input(s): CKTOTAL, CKMB, CKMBINDEX, TROPONINI in the last 168 hours. BNP (last 3 results) No results for input(s): PROBNP in the last 8760 hours. HbA1C: No results for input(s): HGBA1C in the last 72 hours. CBG: Recent Labs  Lab 01/29/18 2029 01/30/18 0029 01/30/18 0250 01/30/18 0510 01/30/18 1156  GLUCAP 147* 205* 167* 161* 158*   Lipid Profile: No results for input(s): CHOL, HDL, LDLCALC, TRIG, CHOLHDL, LDLDIRECT in the last 72 hours. Thyroid Function Tests: No results for input(s): TSH, T4TOTAL, FREET4, T3FREE, THYROIDAB in the last 72 hours. Anemia Panel: No results for input(s): VITAMINB12, FOLATE, FERRITIN, TIBC, IRON, RETICCTPCT in the last 72 hours. Sepsis Labs: Recent Labs  Lab 01/28/18 1721 01/29/18 0438 01/30/18 0512  PROCALCITON 0.60 0.54 0.62    Recent Results (from the past 240 hour(s))  Culture, blood (Routine X 2) w Reflex to ID Panel      Status: None   Collection Time: 01/20/18  8:24 PM  Result Value Ref Range Status   Specimen Description BLOOD RIGHT ARM  Final   Special Requests   Final    BOTTLES DRAWN AEROBIC ONLY Blood Culture adequate volume   Culture   Final    NO GROWTH 5 DAYS Performed at [facility], [address] [place], [place] [address]    Report Status 01/25/2018 FINAL  Final  MRSA PCR Screening     Status: None   Collection Time: 01/23/18  5:18 AM  Result Value Ref Range Status   MRSA by PCR NEGATIVE NEGATIVE Final    Comment:        The GeneXpert MRSA Assay (FDA approved for NASAL specimens only), is one component of a comprehensive MRSA colonization surveillance program. It is not intended to diagnose MRSA infection nor to guide or monitor treatment for MRSA infections. Performed at [facility], [place] [address], [place], [place] [address]   Aerobic/Anaerobic Culture (surgical/deep wound)     Status: None (Preliminary result)   Collection Time: 01/26/18  4:38 PM  Result Value Ref Range Status   Specimen Description ABSCESS ABDOMEN  Final   Special Requests NONE  Final   Gram Stain   Final    MODERATE WBC PRESENT, PREDOMINANTLY PMN NO ORGANISMS SEEN    Culture   Final    FEW ACTINOMYCES ODONTOLYTICUS Standardized susceptibility testing for this organism is not available. HOLDING FOR POSSIBLE ANAEROBE Performed at [facility], [place] [address] [place], [place] [address]    Report Status PENDING  Incomplete         Radiology Studies: Dg Abd Acute W/chest  Result Date: 01/29/2018 CLINICAL DATA:  Abdominal pain and distention today. Nausea and vomiting. EXAM: DG ABDOMEN ACUTE W/ 1V CHEST COMPARISON:  Chest 01/28/2018.  CT abdomen and pelvis 01/22/2018 FINDINGS: Postoperative changes in the mediastinum. Right PICC line with tip over the cavoatrial junction region. No pneumothorax. Cardiac enlargement with mild vascular congestion. Mild perihilar edema. Bilateral  pleural effusions with basilar atelectasis, greater on the right. Similar appearance to previous study. Calcification of the aorta. Multiple dilated gas-filled small bowel loops with mostly decompressed colon. Changes suggest small bowel obstruction. Residual contrast material in the colon. No free intra-abdominal air. Right lower quadrant drainage catheter. Degenerative changes in the spine. No radiopaque stones. IMPRESSION: Congestive  changes in the heart and lungs with cardiac enlargement, pulmonary vascular congestion, mild perihilar edema, and bilateral pleural effusions and basilar atelectasis, greater on the right. Dilated gas-filled small bowel with nondilated colon suggesting small bowel obstruction. Electronically Signed   By: Lucienne Capers M.D.   On: 01/29/2018 00:33        Scheduled Meds: . digoxin  0.0625 mg Oral Daily  . docusate sodium  100 mg Oral BID  . feeding supplement  1 Container Oral BID BM  . furosemide  20 mg Intravenous BID  . insulin aspart  0-15 Units Subcutaneous Q6H  . iopamidol      . lactulose  30 g Oral BID  . mouth rinse  15 mL Mouth Rinse BID  . metoprolol tartrate  2.5 mg Intravenous Q8H  . rifaximin  550 mg Oral BID  . sodium chloride flush  10-40 mL Intracatheter Q12H  . sodium chloride flush  5 mL Intracatheter Q8H  . traZODone  100 mg Oral QHS   Continuous Infusions: . sodium chloride    . heparin 2,050 Units/hr (01/30/18 1221)  . piperacillin-tazobactam (ZOSYN)  IV Stopped (01/30/18 1013)  . TPN ADULT (ION) 65 mL/hr at 01/29/18 1713  . TPN ADULT (ION)    . vancomycin Stopped (01/30/18 1029)     LOS: 11 days    Time spent: 25 mins.More than 50% of that time was spent in counseling and/or coordination of care.      Shelly Coss, MD [facility] Pager [phone]  If 7PM-7AM, please contact night-coverage [email or web address] Password [facility] 01/30/2018, 12:48 PM

## 2018-01-30 NOTE — Progress Notes (Signed)
PHARMACY - ADULT TOTAL PARENTERAL NUTRITION CONSULT NOTE   Pharmacy Consult:  TPN Indication:  Prolonged ileus  Patient Measurements: Height: 5' 5.5" (166.4 cm) Weight: 142 lb (64.4 kg) IBW/kg (Calculated) : 58.15 TPN AdjBW (KG): 64 Body mass index is 23.27 kg/m.  Assessment:  15 YOF presented on 01/05/2018 with N/V/D and abdominal pain, found to have pSBO.  She was placed on a clear liquid diet on 01/21/18.  01/22/18 CT concerning for perforated appendicitis and patient may need surgery; therefore, she was made NPO again.  Pharmacy consulted to initiate TPN due to prolonged npo status.  GI: Cirrhosis with portal venous HTN. Prealbumin <5. Albumin low at 1.9. Advanced to dysphagia 2 diet but still not eating much. Refusing boost breeze supplements but seemed to have drank some on 4/27. Reports still having abdominal pain and nausea. Last BM was 4/26. Most recent imaging shows probable partial SBO. Surgery plans to reimage again soon. Abdominal drain with minimal output. Rifaximin, PRN Zofran Endo: No hx DM, CBGs mostly controlled but had a high of 202 yesterday (140-200s) Insulin requirements in the past 24 hours: 11 units of SSI Lytes: wnl today. CoCa 9.6. Mg 1.9 but give replacement on 4/25. Renal: SCr stable, UOP 0.9 not all charted yesterday. BUN up to 36. Pulm: 2 L [place] Cards: Rheumatic heart dz - BP controlled, previously tachy in Afib, now controlled on digoxin, Lasix, metoprolol AC: warfarin PTA for Afib / mech valves >> heparin gtt  INR 1.5 -rec'd Vit K yesterday,- hgb 8, plts WNL Hepatobil: LFTs WNL, tbili 1.2  Neuro: trazodone, PRN morphine ID: Zosyn for appendiceal abscess/enteritis. Abdominal drain with minimal output yesterday. Afebrile, WBC up to 31.3 TPN Access: PICC placed 01/23/18 TPN start date: 01/23/18  Nutritional Goals (per RD 4/25): 1365-1575 kCal 81-93 g protein per day 1.5-1.6 L fluid/day  Current Nutrition:  Dysphagia 2 diet (0-25% of meal intake  charted) TPN  Plan:  Continue TPN at 77ml/hr This TPN provides 84 g of protein, 250 g of dextrose, and 37.5 g of lipids for a total of 1,560 kcals meeting 100% of patient needs Electrolytes in TPN: standard concentration exc decrease K to 89mEq/L; Cl:Ac 1:1 Add MVI, trace elements to TPN Change to moderate SSI and adjust as needed Monitor TPN labs F/U ability to tolerate diet / boost breeze supplements   Reginia Naas 01/30/2018 7:39 AM

## 2018-01-31 ENCOUNTER — Inpatient Hospital Stay (HOSPITAL_COMMUNITY): Payer: Medicare Other

## 2018-01-31 LAB — CBC
HCT: 24.9 % — ABNORMAL LOW (ref 36.0–46.0)
Hemoglobin: 7.9 g/dL — ABNORMAL LOW (ref 12.0–15.0)
MCH: 32.6 pg (ref 26.0–34.0)
MCHC: 31.7 g/dL (ref 30.0–36.0)
MCV: 102.9 fL — ABNORMAL HIGH (ref 78.0–100.0)
PLATELETS: 173 10*3/uL (ref 150–400)
RBC: 2.42 MIL/uL — ABNORMAL LOW (ref 3.87–5.11)
RDW: 20.7 % — ABNORMAL HIGH (ref 11.5–15.5)
WBC: 36.1 10*3/uL — ABNORMAL HIGH (ref 4.0–10.5)

## 2018-01-31 LAB — COMPREHENSIVE METABOLIC PANEL
ALT: 12 U/L — ABNORMAL LOW (ref 14–54)
AST: 42 U/L — AB (ref 15–41)
Albumin: 1.8 g/dL — ABNORMAL LOW (ref 3.5–5.0)
Alkaline Phosphatase: 69 U/L (ref 38–126)
Anion gap: 4 — ABNORMAL LOW (ref 5–15)
BILIRUBIN TOTAL: 1.8 mg/dL — AB (ref 0.3–1.2)
BUN: 43 mg/dL — AB (ref 6–20)
CO2: 30 mmol/L (ref 22–32)
CREATININE: 1.07 mg/dL — AB (ref 0.44–1.00)
Calcium: 8.1 mg/dL — ABNORMAL LOW (ref 8.9–10.3)
Chloride: 101 mmol/L (ref 101–111)
GFR, EST AFRICAN AMERICAN: 54 mL/min — AB (ref >60)
GFR, EST NON AFRICAN AMERICAN: 47 mL/min — AB (ref >60)
Glucose, Bld: 191 mg/dL — ABNORMAL HIGH (ref 65–99)
POTASSIUM: 5 mmol/L (ref 3.5–5.1)
Sodium: 135 mmol/L (ref 135–145)
TOTAL PROTEIN: 6.5 g/dL (ref 6.5–8.1)

## 2018-01-31 LAB — DIFFERENTIAL
BASOS ABS: 0 10*3/uL (ref 0.0–0.1)
Basophils Relative: 0 %
EOS ABS: 0.4 10*3/uL (ref 0.0–0.7)
Eosinophils Relative: 1 %
Lymphocytes Relative: 8 %
Lymphs Abs: 2.9 10*3/uL (ref 0.7–4.0)
MONO ABS: 4 10*3/uL — AB (ref 0.1–1.0)
Monocytes Relative: 11 %
NEUTROS ABS: 28.8 10*3/uL — AB (ref 1.7–7.7)
NEUTROS PCT: 80 %

## 2018-01-31 LAB — AEROBIC/ANAEROBIC CULTURE W GRAM STAIN (SURGICAL/DEEP WOUND)

## 2018-01-31 LAB — PROTIME-INR
INR: 1.62
Prothrombin Time: 19.1 seconds — ABNORMAL HIGH (ref 11.4–15.2)

## 2018-01-31 LAB — AEROBIC/ANAEROBIC CULTURE (SURGICAL/DEEP WOUND)

## 2018-01-31 LAB — PREALBUMIN: Prealbumin: <5 mg/dL — ABNORMAL LOW (ref 18–38)

## 2018-01-31 LAB — GLUCOSE, CAPILLARY
GLUCOSE-CAPILLARY: 168 mg/dL — AB (ref 65–99)
Glucose-Capillary: 150 mg/dL — ABNORMAL HIGH (ref 65–99)
Glucose-Capillary: 167 mg/dL — ABNORMAL HIGH (ref 65–99)
Glucose-Capillary: 178 mg/dL — ABNORMAL HIGH (ref 65–99)
Glucose-Capillary: 189 mg/dL — ABNORMAL HIGH (ref 65–99)

## 2018-01-31 LAB — HEPARIN LEVEL (UNFRACTIONATED): Heparin Unfractionated: 0.64 IU/mL (ref 0.30–0.70)

## 2018-01-31 LAB — PHOSPHORUS: PHOSPHORUS: 4 mg/dL (ref 2.5–4.6)

## 2018-01-31 LAB — TRIGLYCERIDES: TRIGLYCERIDES: 28 mg/dL (ref <150)

## 2018-01-31 LAB — MAGNESIUM: MAGNESIUM: 2.2 mg/dL (ref 1.7–2.4)

## 2018-01-31 MED ORDER — TRAVASOL 10 % IV SOLN
INTRAVENOUS | Status: AC
  Administered 2018-01-31: 18:00:00 via INTRAVENOUS
  Filled 2018-01-31: qty 842.4

## 2018-01-31 MED ORDER — FUROSEMIDE 10 MG/ML IJ SOLN
80.0000 mg | Freq: Two times a day (BID) | INTRAMUSCULAR | Status: DC
  Administered 2018-01-31 – 2018-02-01 (×3): 80 mg via INTRAVENOUS
  Filled 2018-01-31 (×3): qty 8

## 2018-01-31 NOTE — Progress Notes (Signed)
Pharmacy Antibiotic Note  [name] Chung is a 84 y.o. female admitted on 01/27/2018 with sepsis.  Pharmacy has been consulted for Zosyn and vancomycin dosing.  AKI on admission, now resolved, good UOP. WBC continues to increase, afebrile.  Drain placed 4/24 - low output, growing Actinomyces odontolyticus and Bacteroides fragilis which should be covered by Zosyn.  Plan: Zosyn 3.375g IV q8h (4 hour infusion) Vancomycin 500 mg IV q12h - consider d/c with MRSA PCR neg Monitor clinical progress, c/s, renal function, level PRN Follow for changes to antibiotics/LOT   Height: 5' 5.5" (166.4 cm) Weight: 142 lb (64.4 kg) IBW/kg (Calculated) : 58.15  Temp (24hrs), Avg:98.5 F (36.9 C), Min:98 F (36.7 C), Max:99.1 F (37.3 C)  Recent Labs  Lab 01/26/18 0419 01/27/18 0234 01/28/18 0446 01/29/18 0438 01/30/18 0512 01/31/18 0417  WBC 27.0* 22.8* 30.2* 29.9* 31.3* 36.1*  CREATININE 0.94 0.92  --  0.93 0.97 1.07*    Estimated Creatinine Clearance: 36.6 mL/min (A) (by C-G formula based on SCr of 1.07 mg/dL (H)).    Allergies  Allergen Reactions  . Asa Buff (Mag [Buffered Aspirin] Other (See Comments)    On coumadin  . Codeine   . Mirtazapine Nausea Only  . Ultram [Tramadol Hcl]    Zosyn 4/18>>  Vancomycin 4/26>>  4/24 abscess abdomen: few Actinomyces odontolyticus, abundant bacteroides fragilis (BL+) 4/21 MRSA PCR: neg 4/18 BCx: neg 4/17 Bcx x1: neg 4/17 urine: insig growth   Renold Genta, PharmD, BCPS Clinical Pharmacist Clinical phone for 01/31/2018 until 4p is [phone] After 4p, please call Main Rx at [phone] for assistance 01/31/2018 11:04 AM

## 2018-01-31 NOTE — Progress Notes (Addendum)
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  In emergency room, patient had a CT suggestive of small bowel obstruction.  General surgery consulted and when conservative measures failed, they recommended follow-up CT of the abdomen which noted perforated appendix with abscess.  Patient's INR reversed with vitamin K and she underwent by interventional radiology a CT-guided percutaneous drainage of the abscess.  Cardiology during her hospitalization has been consulted for decompensated CHF and A. fib with RVR.  Her hospital course is complicated with severe leukocytosis, ongoing abdominal pain. She does not have  fever.  Urine unremarkable.  Chest x-ray notes lateral pleural effusions.   CT abdomen/pelvis with contrast done on 01/30/18.  She is being planned for laparotomy.   Assessment & Plan:   Principal Problem:   Partial small bowel obstruction (HCC) Active Problems:   Chronic atrial fibrillation (HCC)   Long term current use of anticoagulant therapy   S/P MVR (mitral valve replacement)   S/P AVR (aortic valve replacement)   Acute renal failure superimposed on stage 3 chronic kidney disease (HCC)   Atrial fibrillation with RVR (HCC)   Chronic systolic (congestive) heart failure (HCC)   Hyponatremia   Sepsis (HCC)   Liver cirrhosis (HCC)   Leukocytosis   Complex appendiceal abscess: Initially presented with possible small bowel obstruction.  Status post drainage.  Surgery following. Continue IV Zosyn and vanco.  On TPN per pharmacy. She might need laparotomy as per surgery.   CT  abdomen and pelvis showed dilated small bowel concerning for small bowel obstruction, thickening of the duodenum and proximal small bowel concerning for enteritis, persistent fluid collection in the pelvis, large right and moderate left pleural effusion cirrhosis and portal venous hypertension. Plan is to put NG tube today.  Leukocytosis: Unclear etiology.  Urine does not look to be a source.  Procalcitonin level mildly elevated.  Seen by speech therapy and cleared for signs of aspiration.  Possible colonic bacterial translocation? No sign of any cellulitis. Continue antibiotics.  Constipation: Continue current regimen.    Left foot pain: Unusual.  Quite tender and more around the left lateral malleolar area. Foot itself is swollen from edema and anasarca.  No heel or toe pain and patient is quite specific where the pain is.  No surrounding erythema or induration.  Uric acid level normal  Chronic A-fib/MVR/AVR: Appreciate cardiology help.  On IV Lopressor and digoxin.  On heparin bridge with Coumadin held.  Heart rate controlled.  Chads 2 score of 5.  Cardiology following  Acute on chronic diastolic heart failure: She has severe anasarca. echocardiogram done November 2018 notes normal ejection fraction.  On IV Lasix .  Creatinine remaines stable.  Cardiology following  Hypokalemia: Supplemented and corrected  Cirrhosis with portal hypertension: Outpatient GI follow-up.  No signs of bleeding.  Stable.  Stepdaughter notes the patient is not able to take lactulose, but her mentation appears stable at this time.  No reason to check ammonia level unless she becomes confused.  Anemia of chronic disease: Likely Secondary to advanced liver disease.No signs of bleeding.  We will continue to monitor her CBC.  Transfused  with w 1 unit of PRBC on 01/31/18.  Dysphagia:  Currently n.p.o. given worsening abdominal distention  and anticipation for surgery.  Poor prognosis/goals of care: I have requested  consultation for palliative care to discuss goals of care.  Patient has poor prognosis and she has high risk for mortality if she goes for surgery.  Patient wants to discuss with her family before deciding on anything.     DVT prophylaxis: Heparin drip Code Status: Full Family Communication: Caregiver on the bedside Disposition Plan: Needs to be determined   Consultants: General surgery, cardiology, IR  Procedures: Appendiceal abscess drain tube placement on 4/24  Antimicrobials: IV Zosyn since 4/17 Vancomycin since 4/26  Subjective:  Patient seen and examined at bedside this morning.  Remains weak and distressed with abdominal discomfort.  Edema continues to worsen. Objective: Vitals:   01/30/18 2137 01/31/18 0137 01/31/18 0538 01/31/18 0908  BP: (!) 136/118 (!) 127/58 128/67   Pulse: (!) 113 91 (!) 117 (!) 110  Resp: (!) 21 (!) 21 (!) 21   Temp: 98.4 F (36.9 C) 98.1 F (36.7 C) 98 F (36.7 C)   TempSrc: Oral Oral Axillary   SpO2: 92% 93% 92%   Weight:      Height:        Intake/Output Summary (Last 24 hours) at 01/31/2018 1359 Last data filed at 01/31/2018 1153 Gross per 24 hour  Intake 927.39 ml  Output 2105 ml  Net -1177.61 ml   Filed Weights   01/27/18 0645 01/28/18 0500 01/30/18 0304  Weight: 64 kg (141 lb) 61.7 kg (136 lb) 64.4 kg (142 lb)    Examination:  General exam: Chronically ill looking , in in mild to moderate distress due to abdominal discomfort. HEENT:PERRL,Oral mucosa moist, Ear/Nose normal on gross exam Respiratory system: Bilateral decreased air entry in the bases Cardiovascular system: S1 & S2 heard, irregularly irregular. No JVD, murmurs, rubs, gallops or clicks. Pedal edema. Gastrointestinal system: Abdomen is distended, mild generalized tenderness. No organomegaly or masses felt.  Sluggish bowel sounds. Central nervous system: Alert and oriented. No focal neurological deficits. Extremities: 2-3+ pitting  edema, no clubbing ,no cyanosis,  distal peripheral pulses palpable.  Tender left foot in the lateral malleolar area, no signs of erythema or induration Skin: No ulcers,no icterus ,no pallor   Data Reviewed: I have personally reviewed following labs and imaging studies  CBC: Recent Labs  Lab 01/26/18 0419 01/27/18 0234 01/28/18 0446 01/29/18 0438 01/30/18 0512 01/31/18 0417  WBC 27.0* 22.8* 30.2* 29.9* 31.3* 36.1*  NEUTROABS 21.9*  --   --   --  25.4* 28.8*  HGB 8.9* 8.0* 8.0* 7.7* 7.3* 7.9*  HCT 26.3* 24.0* 24.4* 23.3* 21.6* 24.9*  MCV 106.5* 107.6* 108.0* 107.9* 108.0* 102.9*  PLT 195 158 163 175 174 401   Basic Metabolic Panel: Recent Labs  Lab 01/25/18 0502 01/26/18 0419 01/27/18 0234 01/29/18 0438 01/30/18 0512 01/31/18 0417  NA 136 146* 136 134* 135 135  K 3.1* 4.2 3.9 4.1 4.5 5.0  CL 100* 102 99* 96* 99* 101  CO2 30 40* 29 31 31 30   GLUCOSE 152* 144* 177* 161* 147* 191*  BUN 18 19 23* 31* 36* 43*  CREATININE 0.94 0.94 0.92 0.93 0.97 1.07*  CALCIUM 7.7* 7.7* 7.9* 8.1* 8.0* 8.1*  MG 1.9  --  1.9  --   --  2.2  PHOS  --  2.2* 3.0  --   --  4.0   GFR: Estimated Creatinine Clearance: 36.6 mL/min (  A) (by C-G formula based on SCr of 1.07 mg/dL (H)). Liver Function Tests: Recent Labs  Lab 01/26/18 0419 01/27/18 0234 01/31/18 0417  AST  --  30 42*  ALT  --  12* 12*  ALKPHOS  --  70 69  BILITOT  --  1.2 1.8*  PROT  --  5.6* 6.5  ALBUMIN 1.6* 1.9* 1.8*   No results for input(s): LIPASE, AMYLASE in the last 168 hours. No results for input(s): AMMONIA in the last 168 hours. Coagulation Profile: Recent Labs  Lab 01/27/18 0234 01/28/18 0446 01/29/18 0438 01/30/18 0512 01/31/18 0417  INR 1.53 1.56 1.57 1.71 1.62   Cardiac Enzymes: No results for input(s): CKTOTAL, CKMB, CKMBINDEX, TROPONINI in the last 168 hours. BNP (last 3 results) No results for input(s): PROBNP in the last 8760 hours. HbA1C: No results for input(s): HGBA1C in the last 72 hours. CBG: Recent Labs  Lab  01/30/18 1156 01/30/18 1800 01/31/18 0005 01/31/18 0609 01/31/18 1247  GLUCAP 158* 158* 167* 189* 178*   Lipid Profile: Recent Labs    01/31/18 0417  TRIG 28   Thyroid Function Tests: No results for input(s): TSH, T4TOTAL, FREET4, T3FREE, THYROIDAB in the last 72 hours. Anemia Panel: No results for input(s): VITAMINB12, FOLATE, FERRITIN, TIBC, IRON, RETICCTPCT in the last 72 hours. Sepsis Labs: Recent Labs  Lab 01/28/18 1721 01/29/18 0438 01/30/18 0512  PROCALCITON 0.60 0.54 0.62    Recent Results (from the past 240 hour(s))  MRSA PCR Screening     Status: None   Collection Time: 01/23/18  5:18 AM  Result Value Ref Range Status   MRSA by PCR NEGATIVE NEGATIVE Final    Comment:        The GeneXpert MRSA Assay (FDA approved for NASAL specimens only), is one component of a comprehensive MRSA colonization surveillance program. It is not intended to diagnose MRSA infection nor to guide or monitor treatment for MRSA infections. Performed at [facility], [place] [address], [place], [place] [address]   Aerobic/Anaerobic Culture (surgical/deep wound)     Status: None   Collection Time: 01/26/18  4:38 PM  Result Value Ref Range Status   Specimen Description ABSCESS ABDOMEN  Final   Special Requests NONE  Final   Gram Stain   Final    MODERATE WBC PRESENT, PREDOMINANTLY PMN NO ORGANISMS SEEN    Culture   Final    FEW ACTINOMYCES ODONTOLYTICUS Standardized susceptibility testing for this organism is not available. ABUNDANT BACTEROIDES FRAGILIS BETA LACTAMASE POSITIVE Performed at [facility], [place] [address], [place], [place] [address]    Report Status 01/31/2018 FINAL  Final         Radiology Studies: Ct Abdomen Pelvis W Contrast  Result Date: 01/30/2018 CLINICAL DATA:  Patient with abdominal pain. Evaluate for bowel obstruction. EXAM: CT ABDOMEN AND PELVIS WITH CONTRAST TECHNIQUE: Multidetector CT imaging of the abdomen and pelvis was performed  using the standard protocol following bolus administration of intravenous contrast. CONTRAST:  118mL ISOVUE-300 IOPAMIDOL (ISOVUE-300) INJECTION 61% COMPARISON:  CT abdomen pelvis 01/22/2018; drainage CT 01/26/2018 FINDINGS: Lower chest: Heart is enlarged. Moderate right and small left pleural effusions with underlying pulmonary consolidation. Hepatobiliary: The liver is lobular in contour. Similar-appearing subcentimeter low-attenuation lesion right hepatic lobe (image 20; series 3). Multiple gallstones. No intrahepatic or extrahepatic biliary ductal dilatation. Pancreas: Unremarkable Spleen: Unremarkable Adrenals/Urinary Tract: Adrenal glands are normal. Kidneys enhance symmetrically with contrast. No hydronephrosis. Bladder is drained with a Foley catheter. Stomach/Bowel: Mild  duodenal and proximal small bowel wall thickening. There is small bowel dilatation most notable within the mid to distal small bowel measuring up to 4.8 cm (image 73; series 3). There is transition to decompressed small bowel within the central lower abdomen. Small amount of free fluid within the upper abdomen surrounding the liver and spleen. There is wall thickening of the duodenum and proximal small bowel. No definite free intraperitoneal air. Vascular/Lymphatic: Peripheral calcified atherosclerotic plaque. No retroperitoneal lymphadenopathy. Multiple collateral vessels within the left upper hemiabdomen compatible with portal venous hypertension. Reproductive: Status post hysterectomy. Other: Anasarca. Right lower quadrant pigtail drainage catheter is demonstrated. This is located centrally and has drained one of the previously described right lower quadrant fluid collections. There is an additional complex cystic collection within the right pelvic sidewall which measures 3.9 x 3.6 cm (image 64; series 3). There is a loculated fluid collection within the pelvis which measures 3.9 x 7.2 cm (image 73; series 3). Musculoskeletal: Lumbar  spine degenerative changes. There is expansion of the left upper inner muscle (image 84; series 3). IMPRESSION: 1. The small bowel is markedly dilated with transition to decompressed small bowel within the pelvis concerning for small bowel obstruction. Additionally there is wall thickening of the duodenum and proximal small bowel concerning for enteritis which may be reactive, infectious, inflammatory or ischemic in etiology. 2. Interval insertion right lower quadrant pigtail drainage catheter with drainage of one of the right lower quadrant fluid collections. The adjacent right pelvic sidewall complex fluid collection persists and may represent an abscess or potentially complex ovarian lesion. Consider further evaluation of this lesion with pelvic ultrasound. Additionally there is a loculated fluid collection within the pelvis. 3. Interval expansion of the left obturator muscle. This may be secondary to intramuscular hematoma or potentially intramuscular infection. 4. Large right and moderate left pleural effusions with underlying pulmonary consolidation which may represent atelectasis or infection. 5. Cholelithiasis. 6. Anasarca. 7. Cirrhosis and portal venous hypertension. 8. These results will be called to the ordering clinician or representative by the Radiologist Assistant, and communication documented in the PACS or zVision Dashboard. Electronically Signed   By: [place]y Newcomer M.D.   On: 01/30/2018 18:32   Dg Abd Portable 1v  Result Date: 01/31/2018 CLINICAL DATA:  84 year old female with evidence of small bowel obstruction in the pelvis on CT yesterday. Fluid collections, percutaneous drainage catheter placed. EXAM: PORTABLE ABDOMEN - 1 VIEW COMPARISON:  CT Abdomen and Pelvis 01/30/2018 and earlier. FINDINGS: Portable AP supine views at 0953 hours. Right lower quadrant pigtail drain remains in place. There is gas throughout small and large bowel loops. Small bowel loops in the mid to lower abdomen remain  dilated up to 48 millimeters diameter. Oral contrast yesterday now appears to be primarily within the proximal colon. Superimposed evidence of ascites, and the right pleural effusion demonstrated yesterday. No definite pneumoperitoneum on these supine views. No acute osseous abnormality identified. IMPRESSION: 1. Stable bowel gas pattern since yesterday compatible with a high-grade partial small bowel obstruction, but there may be superimposed small and large bowel ileus as well. 2. Evidence of ascites and pleural effusion redemonstrated. 3. Stable right lower quadrant percutaneous drain. Electronically Signed   By: Genevie Ann M.D.   On: 01/31/2018 10:22        Scheduled Meds: . digoxin  0.0625 mg Oral Daily  . docusate sodium  100 mg Oral BID  . feeding supplement  1 Container Oral BID BM  . furosemide  80 mg Intravenous Q12H  .  insulin aspart  0-15 Units Subcutaneous Q6H  . lactulose  30 g Oral BID  . mouth rinse  15 mL Mouth Rinse BID  . metoprolol tartrate  2.5 mg Intravenous Q8H  . rifaximin  550 mg Oral BID  . sodium chloride flush  10-40 mL Intracatheter Q12H  . sodium chloride flush  5 mL Intracatheter Q8H  . traZODone  100 mg Oral QHS   Continuous Infusions: . sodium chloride    . heparin 2,000 Units/hr (01/31/18 0931)  . piperacillin-tazobactam (ZOSYN)  IV 3.375 g (01/31/18 1312)  . TPN ADULT (ION) 65 mL/hr at 01/30/18 1726  . TPN ADULT (ION)    . vancomycin Stopped (01/31/18 1015)     LOS: 12 days    Time spent: 25 mins.More than 50% of that time was spent in counseling and/or coordination of care.      Shelly Coss, MD [facility] Pager [phone]  If 7PM-7AM, please contact night-coverage [email or web address] Password [facility] 01/31/2018, 1:59 PM

## 2018-01-31 NOTE — Progress Notes (Signed)
ANTICOAGULATION CONSULT NOTE - Follow Up Consult  Pharmacy Consult for Heparin Indication: atrial fibrillation and mechanical AVR & MVR  Patient Measurements: Height: 5' 5.5" (166.4 cm) Weight: 142 lb (64.4 kg) IBW/kg (Calculated) : 58.15 Heparin Dosing Weight: 58 kg  Vital Signs: Temp: 98 F (36.7 C) (04/29 0538) Temp Source: Axillary (04/29 0538) BP: 128/67 (04/29 0538) Pulse Rate: 110 (04/29 0908)  Labs: Recent Labs    01/29/18 0438 01/30/18 0512 01/31/18 0417  HGB 7.7* 7.3* 7.9*  HCT 23.3* 21.6* 24.9*  PLT 175 174 173  LABPROT 18.6* 19.9* 19.1*  INR 1.57 1.71 1.62  HEPARINUNFRC 0.51 0.68 0.64  CREATININE 0.93 0.97 1.07*    Estimated Creatinine Clearance: 36.6 mL/min (A) (by C-G formula based on SCr of 1.07 mg/dL (H)).  Assessment: 84 yr old female on warfarin prior to admission for mechanical AVR & MVR and atrial fibrillation. Warfarin has been held due to elevated INR and need for procedures. Patient has received vitamin K and FFP to reverse INR.  Patient continues on heparin bridge - therapeutic at 0.64 on 2050 units/hr but noted to cough up blood clots and nose bleed last night. Spoke with RN this morning and her nose is dry and coughed a little blood today but otherwise stable. Also, CT shows possible obturator muscle hematoma. Will change heparin goal to 0.3-0.5 due to above. Hgb in 7's last 2 days, received 1 unit PRBC last night, platelets are normal.  Goal of Therapy:  Heparin level 0.3-0.5 units/ml Monitor platelets by anticoagulation protocol: Yes   Plan:  Decrease heparin drip to 2000 units/hr Daily heparin level, CBC, and INR  Monitor for s/sx of bleeding   Renold Genta, PharmD, BCPS Clinical Pharmacist Clinical phone for 01/31/2018 until 4p is [phone] After 4p, please call Main Rx at [phone] for assistance 01/31/2018 11:00 AM

## 2018-01-31 NOTE — Progress Notes (Addendum)
Progress Note  Patient Name: [name] Chung Date of Encounter: 01/31/2018  Primary Cardiologist: Peter Martinique, MD   Subjective   Looks miserable, moaning in pain  Inpatient Medications    Scheduled Meds: . digoxin  0.0625 mg Oral Daily  . docusate sodium  100 mg Oral BID  . feeding supplement  1 Container Oral BID BM  . furosemide  20 mg Intravenous BID  . insulin aspart  0-15 Units Subcutaneous Q6H  . lactulose  30 g Oral BID  . mouth rinse  15 mL Mouth Rinse BID  . metoprolol tartrate  2.5 mg Intravenous Q8H  . rifaximin  550 mg Oral BID  . sodium chloride flush  10-40 mL Intracatheter Q12H  . sodium chloride flush  5 mL Intracatheter Q8H  . traZODone  100 mg Oral QHS   Continuous Infusions: . sodium chloride    . heparin 2,050 Units/hr (01/31/18 0615)  . piperacillin-tazobactam (ZOSYN)  IV 3.375 g (01/31/18 0558)  . TPN ADULT (ION) 65 mL/hr at 01/30/18 1726  . TPN ADULT (ION)    . vancomycin Stopped (01/31/18 0034)   PRN Meds: acetaminophen **OR** acetaminophen, hydrALAZINE, morphine injection, ondansetron (ZOFRAN) IV, RESOURCE THICKENUP CLEAR, sodium chloride flush, zolpidem   Vital Signs    Vitals:   01/30/18 2028 01/30/18 2137 01/31/18 0137 01/31/18 0538  BP: (!) 141/85 (!) 136/118 (!) 127/58 128/67  Pulse: (!) 105 (!) 113 91 (!) 117  Resp: 20 (!) 21 (!) 21 (!) 21  Temp: 98.4 F (36.9 C) 98.4 F (36.9 C) 98.1 F (36.7 C) 98 F (36.7 C)  TempSrc: Oral Oral Oral Axillary  SpO2: 94% 92% 93% 92%  Weight:      Height:        Intake/Output Summary (Last 24 hours) at 01/31/2018 0838 Last data filed at 01/31/2018 0020 Gross per 24 hour  Intake 2772.85 ml  Output 1355 ml  Net 1417.85 ml   Filed Weights   01/27/18 0645 01/28/18 0500 01/30/18 0304  Weight: 141 lb (64 kg) 136 lb (61.7 kg) 142 lb (64.4 kg)    Telemetry    AF with VR 90-110- Personally Reviewed  ECG      Physical Exam   GEN: WD female, moaning in pain Neck: No JVD Cardiac:  irregularly irregular with positive valve sounds Respiratory: decreased breath sounds GI: distended, BS diminnished MS: diffuse edema Neuro:  Nonfocal  Psych: Normal affect   Labs    Chemistry Recent Labs  Lab 01/26/18 0419 01/27/18 0234 01/29/18 0438 01/30/18 0512 01/31/18 0417  NA 146* 136 134* 135 135  K 4.2 3.9 4.1 4.5 5.0  CL 102 99* 96* 99* 101  CO2 40* 29 31 31 30   GLUCOSE 144* 177* 161* 147* 191*  BUN 19 23* 31* 36* 43*  CREATININE 0.94 0.92 0.93 0.97 1.07*  CALCIUM 7.7* 7.9* 8.1* 8.0* 8.1*  PROT  --  5.6*  --   --  6.5  ALBUMIN 1.6* 1.9*  --   --  1.8*  AST  --  30  --   --  42*  ALT  --  12*  --   --  12*  ALKPHOS  --  70  --   --  69  BILITOT  --  1.2  --   --  1.8*  GFRNONAA 55* 56* 55* 53* 47*  GFRAA >60 >60 >60 >60 54*  ANIONGAP 4* 8 7 5  4*     Hematology Recent Labs  Lab  01/29/18 0438 01/30/18 0512 01/31/18 0417  WBC 29.9* 31.3* 36.1*  RBC 2.16* 2.00* 2.42*  HGB 7.7* 7.3* 7.9*  HCT 23.3* 21.6* 24.9*  MCV 107.9* 108.0* 102.9*  MCH 35.6* 36.5* 32.6  MCHC 33.0 33.8 31.7  RDW 15.6* 16.2* 20.7*  PLT 175 174 173    Cardiac EnzymesNo results for input(s): TROPONINI in the last 168 hours. No results for input(s): TROPIPOC in the last 168 hours.   BNPNo results for input(s): BNP, PROBNP in the last 168 hours.   DDimer No results for input(s): DDIMER in the last 168 hours.   Radiology    Ct Abdomen Pelvis W Contrast  Result Date: 01/30/2018 CLINICAL DATA:  Patient with abdominal pain. Evaluate for bowel obstruction. EXAM: CT ABDOMEN AND PELVIS WITH CONTRAST TECHNIQUE: Multidetector CT imaging of the abdomen and pelvis was performed using the standard protocol following bolus administration of intravenous contrast. CONTRAST:  118mL ISOVUE-300 IOPAMIDOL (ISOVUE-300) INJECTION 61% COMPARISON:  CT abdomen pelvis 01/22/2018; drainage CT 01/26/2018 FINDINGS: Lower chest: Heart is enlarged. Moderate right and small left pleural effusions with underlying  pulmonary consolidation. Hepatobiliary: The liver is lobular in contour. Similar-appearing subcentimeter low-attenuation lesion right hepatic lobe (image 20; series 3). Multiple gallstones. No intrahepatic or extrahepatic biliary ductal dilatation. Pancreas: Unremarkable Spleen: Unremarkable Adrenals/Urinary Tract: Adrenal glands are normal. Kidneys enhance symmetrically with contrast. No hydronephrosis. Bladder is drained with a Foley catheter. Stomach/Bowel: Mild duodenal and proximal small bowel wall thickening. There is small bowel dilatation most notable within the mid to distal small bowel measuring up to 4.8 cm (image 73; series 3). There is transition to decompressed small bowel within the central lower abdomen. Small amount of free fluid within the upper abdomen surrounding the liver and spleen. There is wall thickening of the duodenum and proximal small bowel. No definite free intraperitoneal air. Vascular/Lymphatic: Peripheral calcified atherosclerotic plaque. No retroperitoneal lymphadenopathy. Multiple collateral vessels within the left upper hemiabdomen compatible with portal venous hypertension. Reproductive: Status post hysterectomy. Other: Anasarca. Right lower quadrant pigtail drainage catheter is demonstrated. This is located centrally and has drained one of the previously described right lower quadrant fluid collections. There is an additional complex cystic collection within the right pelvic sidewall which measures 3.9 x 3.6 cm (image 64; series 3). There is a loculated fluid collection within the pelvis which measures 3.9 x 7.2 cm (image 73; series 3). Musculoskeletal: Lumbar spine degenerative changes. There is expansion of the left upper inner muscle (image 84; series 3). IMPRESSION: 1. The small bowel is markedly dilated with transition to decompressed small bowel within the pelvis concerning for small bowel obstruction. Additionally there is wall thickening of the duodenum and proximal  small bowel concerning for enteritis which may be reactive, infectious, inflammatory or ischemic in etiology. 2. Interval insertion right lower quadrant pigtail drainage catheter with drainage of one of the right lower quadrant fluid collections. The adjacent right pelvic sidewall complex fluid collection persists and may represent an abscess or potentially complex ovarian lesion. Consider further evaluation of this lesion with pelvic ultrasound. Additionally there is a loculated fluid collection within the pelvis. 3. Interval expansion of the left obturator muscle. This may be secondary to intramuscular hematoma or potentially intramuscular infection. 4. Large right and moderate left pleural effusions with underlying pulmonary consolidation which may represent atelectasis or infection. 5. Cholelithiasis. 6. Anasarca. 7. Cirrhosis and portal venous hypertension. 8. These results will be called to the ordering clinician or representative by the Radiologist Assistant, and communication documented in the  PACS or zVision Dashboard. Electronically Signed   By: Lovey Newcomer M.D.   On: 01/30/2018 18:32    Cardiac Studies   Echo 08/19/17- Impressions:  - High flow velocities are seen across all four cardiac valves,   suggestive of increased cardiac output (e.g. anemia,   thyrotoxicosis, infection, etc.). Gradients are measured at a   mean heart rate of 88 bpm. Relatively fast heart rate also   exaggerates gradients across the mitral and tricuspid valves..   The previous report described moderate to severe and perivalvular   aortic insufficiency, but no aortic insufficiency is seen on the   current study (suspect interval aortic valve surgery).Marland Kitchen LVOT flow   velocities were not accurately measured - this limits the ability   to calculate the cardiac output and fully evaluate valve   hemodynamics.  Patient Profile     84 y.o. female with a history ofrheumatic valvular heart disease status post redo  aortic valve replacement x3 and redo mitral valve surgery x2, persistent atrial fibrillation, coumadin for anticoagulation, chronic LE edema (multifactorial with diastolic CHF, venous insufficiency, cirrhosis)now admitted with perforated appendix with SBO. We are assisting with management of diastolic heart failure and atrial fibrillation.     Assessment & Plan    1. Rheumatic heart disease s/p AVR x3 and MVR x2: Has a [employer] mechanical mitral valve and [employer] freestyle aortic root conduit.  -- coumadin has been held with  heparin bridge. s/p IR drainage of abscess but may still need surgery.   Continue IV heparin for now until no further interventions needed.  2. Abd pain 2/2 to perforated appendicitis with abscess: CT scan with concern for perforated appendicitis, surgery following s/p drain placement. Now with SBO, suspect she may end up needing laparotomy.  3. Chronic Afib: on IV lopressor and home dose Dig with good rate control. Rate controlled. Now on heparin bridge with coumadin held.   4. Acute on Chronic diastolic HF: Last echo from 11/18 with normal EF and no WMA. CXR on admission with edema and right sided pleural effusion. CT 01/30/18  suggests anasarca. I/O negative 762, BUN up to 43/ SCr 1.07. Not on a diuretic.   5. Anemia: Hgb down to 7.3, possible obturator muscle hematoma on CT  Plan: Awaiting input from surgery. Cardiology will follow. Hesitant to push Lasix with increasing BUN though this could also be from internal bleeding.    For questions or updates, please contact [place] Please consult [email or web address] for contact info under Cardiology/STEMI.      Angelena Form, PA-C  01/31/2018, 8:38 AM    Patient seen, examined. Available data reviewed. Agree with findings, assessment, and plan as outlined by Kerin Ransom, PA-C.  The patient is independently interviewed and evaluated.  She is short of breath and uncomfortable with lower abdominal pain.  She  states there is no change in the pain pattern over the last 24 hours.  Her admission weight is 114 pounds And her current weight is 142 pounds.  On my exam, pertinent findings include elevated jugular venous pressure, tachycardic heart sounds with a 2/6 systolic murmur at the right upper sternal border and normal mechanical mitral closure sound, and 3+ lower extremity edema.  As noted above, the patient has acute on chronic diastolic heart failure and obvious volume overload on exam.  She will likely require abdominal surgery for treatment of perforated appendix and bowel obstruction.  We will diurese her today with furosemide 80 mg IV x2 doses and  follow her response with repeat labs tomorrow morning.  She will be at high risk of surgery as noted by Dr. Dalbert Batman, but suspect there will be no alternatives as she is failing conservative management per the surgical team.  She should be continued on IV heparin perioperatively because of her mechanical mitral valve prosthesis.  Sherren Mocha, M.D. 01/31/2018 9:46 AM

## 2018-01-31 NOTE — Progress Notes (Signed)
2 attempts to place NG tube by 1 RN, unsuccessful. Will get another RN to attempt.

## 2018-01-31 NOTE — Progress Notes (Signed)
[facility] Progress Note     Subjective: CC: abdominal pain and distention Patient reports lower abdominal pain and worsening distention. She states she is miserable and asking if she is a candidate for a morphine drip. Discussed that she may need surgery but reiterated that she is a poor surgical candidate and that the mortality risk would be very high.  Spoke with patient's daughter on speaker phone. UOP good. Tachycardic  Objective: Vital signs in last 24 hours: Temp:  [98 F (36.7 C)-99.1 F (37.3 C)] 98 F (36.7 C) (04/29 0538) Pulse Rate:  [91-117] 110 (04/29 0908) Resp:  [20-24] 21 (04/29 0538) BP: (114-141)/(58-118) 128/67 (04/29 0538) SpO2:  [92 %-94 %] 92 % (04/29 0538) Last BM Date: 01/29/18  Intake/Output from previous day: 04/28 0701 - 04/29 0700 In: 2772.9 [P.O.:220; I.V.:1882.9; Blood:315; IV Piggyback:350] Out: 7408 [Urine:1350; Drains:5] Intake/Output this shift: No intake/output data recorded.  PE: Gen:  Alert, in obvious discomfort Card:  Tahcycardia, irregular rhythm Pulm:  tachypneic , clear to auscultation bilaterally Abd: diffusely tender, very distended, absent bowel sounds, drain in RLQ with minimal bloody drainage in bulb Skin: warm and dry, no rashes   Lab Results:  Recent Labs    01/30/18 0512 01/31/18 0417  WBC 31.3* 36.1*  HGB 7.3* 7.9*  HCT 21.6* 24.9*  PLT 174 173   BMET Recent Labs    01/30/18 0512 01/31/18 0417  NA 135 135  K 4.5 5.0  CL 99* 101  CO2 31 30  GLUCOSE 147* 191*  BUN 36* 43*  CREATININE 0.97 1.07*  CALCIUM 8.0* 8.1*   PT/INR Recent Labs    01/30/18 0512 01/31/18 0417  LABPROT 19.9* 19.1*  INR 1.71 1.62   CMP     Component Value Date/Time   NA 135 01/31/2018 0417   NA 136 01/04/2018 1416   K 5.0 01/31/2018 0417   CL 101 01/31/2018 0417   CO2 30 01/31/2018 0417   GLUCOSE 191 (H) 01/31/2018 0417   BUN 43 (H) 01/31/2018 0417   BUN 16 01/04/2018 1416   CREATININE 1.07 (H) 01/31/2018  0417   CREATININE 0.67 03/13/2015 1419   CALCIUM 8.1 (L) 01/31/2018 0417   PROT 6.5 01/31/2018 0417   PROT 7.3 12/27/2017 1423   ALBUMIN 1.8 (L) 01/31/2018 0417   ALBUMIN 3.2 (L) 12/27/2017 1423   AST 42 (H) 01/31/2018 0417   ALT 12 (L) 01/31/2018 0417   ALKPHOS 69 01/31/2018 0417   BILITOT 1.8 (H) 01/31/2018 0417   BILITOT 0.9 12/27/2017 1423   GFRNONAA 47 (L) 01/31/2018 0417   GFRAA 54 (L) 01/31/2018 0417   Lipase     Component Value Date/Time   LIPASE 46 01/08/2018 1544       Studies/Results: Ct Abdomen Pelvis W Contrast  Result Date: 01/30/2018 CLINICAL DATA:  Patient with abdominal pain. Evaluate for bowel obstruction. EXAM: CT ABDOMEN AND PELVIS WITH CONTRAST TECHNIQUE: Multidetector CT imaging of the abdomen and pelvis was performed using the standard protocol following bolus administration of intravenous contrast. CONTRAST:  143mL ISOVUE-300 IOPAMIDOL (ISOVUE-300) INJECTION 61% COMPARISON:  CT abdomen pelvis 01/22/2018; drainage CT 01/26/2018 FINDINGS: Lower chest: Heart is enlarged. Moderate right and small left pleural effusions with underlying pulmonary consolidation. Hepatobiliary: The liver is lobular in contour. Similar-appearing subcentimeter low-attenuation lesion right hepatic lobe (image 20; series 3). Multiple gallstones. No intrahepatic or extrahepatic biliary ductal dilatation. Pancreas: Unremarkable Spleen: Unremarkable Adrenals/Urinary Tract: Adrenal glands are normal. Kidneys enhance symmetrically with contrast. No hydronephrosis. Bladder is drained  with a Foley catheter. Stomach/Bowel: Mild duodenal and proximal small bowel wall thickening. There is small bowel dilatation most notable within the mid to distal small bowel measuring up to 4.8 cm (image 73; series 3). There is transition to decompressed small bowel within the central lower abdomen. Small amount of free fluid within the upper abdomen surrounding the liver and spleen. There is wall thickening of the  duodenum and proximal small bowel. No definite free intraperitoneal air. Vascular/Lymphatic: Peripheral calcified atherosclerotic plaque. No retroperitoneal lymphadenopathy. Multiple collateral vessels within the left upper hemiabdomen compatible with portal venous hypertension. Reproductive: Status post hysterectomy. Other: Anasarca. Right lower quadrant pigtail drainage catheter is demonstrated. This is located centrally and has drained one of the previously described right lower quadrant fluid collections. There is an additional complex cystic collection within the right pelvic sidewall which measures 3.9 x 3.6 cm (image 64; series 3). There is a loculated fluid collection within the pelvis which measures 3.9 x 7.2 cm (image 73; series 3). Musculoskeletal: Lumbar spine degenerative changes. There is expansion of the left upper inner muscle (image 84; series 3). IMPRESSION: 1. The small bowel is markedly dilated with transition to decompressed small bowel within the pelvis concerning for small bowel obstruction. Additionally there is wall thickening of the duodenum and proximal small bowel concerning for enteritis which may be reactive, infectious, inflammatory or ischemic in etiology. 2. Interval insertion right lower quadrant pigtail drainage catheter with drainage of one of the right lower quadrant fluid collections. The adjacent right pelvic sidewall complex fluid collection persists and may represent an abscess or potentially complex ovarian lesion. Consider further evaluation of this lesion with pelvic ultrasound. Additionally there is a loculated fluid collection within the pelvis. 3. Interval expansion of the left obturator muscle. This may be secondary to intramuscular hematoma or potentially intramuscular infection. 4. Large right and moderate left pleural effusions with underlying pulmonary consolidation which may represent atelectasis or infection. 5. Cholelithiasis. 6. Anasarca. 7. Cirrhosis and  portal venous hypertension. 8. These results will be called to the ordering clinician or representative by the Radiologist Assistant, and communication documented in the PACS or zVision Dashboard. Electronically Signed   By: Lovey Newcomer M.D.   On: 01/30/2018 18:32    Anti-infectives: Anti-infectives (From admission, onward)   Start     Dose/Rate Route Frequency Ordered Stop   01/29/18 1000  vancomycin (VANCOCIN) 500 mg in sodium chloride 0.9 % 100 mL IVPB     500 mg 100 mL/hr over 60 Minutes Intravenous Every 12 hours 01/28/18 1411     01/28/18 1500  vancomycin (VANCOCIN) IVPB 1000 mg/200 mL premix     1,000 mg 200 mL/hr over 60 Minutes Intravenous  Once 01/28/18 1411 01/29/18 0102   01/22/18 1600  piperacillin-tazobactam (ZOSYN) IVPB 3.375 g     3.375 g 12.5 mL/hr over 240 Minutes Intravenous Every 8 hours 01/22/18 1014     01/20/18 1500  piperacillin-tazobactam (ZOSYN) IVPB 2.25 g  Status:  Discontinued     2.25 g 100 mL/hr over 30 Minutes Intravenous Every 6 hours 01/20/18 1211 01/22/18 1014   01/20/18 0500  piperacillin-tazobactam (ZOSYN) IVPB 2.25 g  Status:  Discontinued     2.25 g 100 mL/hr over 30 Minutes Intravenous Every 6 hours 01/10/2018 2220 01/20/18 1211   01/08/2018 2330  rifaximin (XIFAXAN) tablet 550 mg     550 mg Oral 2 times daily 01/16/2018 2224     01/26/2018 2230  piperacillin-tazobactam (ZOSYN) IVPB 3.375 g  3.375 g 100 mL/hr over 30 Minutes Intravenous  Once 01/13/2018 2220 01/29/2018 2300       Assessment/Plan Perforated appendicitis with abscess versus neoplasia. Radiology favors appendicitis. Significant leukocytosis - trending up, 36.1 today  Status post MVR and AVR on Coumadin Currently on heparin drip with INR 1.62  S/p perc drain 4/24- purulent output  Seems to be worsening - SBO secondary to her inflammatory process.  She may require laparotomy.  This would likely result in ileocecal resection and probably permanent ileostomy. Per the [employer] of surgeons risk calculator.  Due to MMP and profound deconditionong/age estimate  50% chance of major complications.  40 to 50% mortality risk.  70%, at least, risk of discharge to SNF.  Repeat CT 4/28 showing sbo with wall thickening of duodenum and proximal small bowel, persistent complex fluid collections in pelvis  FEN: TPN VTE: heparin gtt ID: PO rifaximin 4/17>>, Zosyn 4/20>>, vancomycin 4/27>>  Ordering abdominal film now and NGT for decompression in the meantime. Need to establish goals with patient and family.    LOS: 12 days    Brigid Re , [facility] 01/31/2018, 9:31 AM Pager: [phone] Consults: [phone] Mon-Fri 7:00 am-4:30 pm Sat-Sun 7:00 am-11:30 am

## 2018-01-31 NOTE — Progress Notes (Addendum)
PHARMACY - ADULT TOTAL PARENTERAL NUTRITION CONSULT NOTE   Pharmacy Consult:  TPN Indication:  Prolonged ileus  Patient Measurements: Height: 5' 5.5" (166.4 cm) Weight: 142 lb (64.4 kg) IBW/kg (Calculated) : 58.15 TPN AdjBW (KG): 64 Body mass index is 23.27 kg/m.  Assessment:  80 YOF presented on 01/28/2018 with N/V/D and abdominal pain, found to have pSBO.  She was placed on a clear liquid diet on 01/21/18.  01/22/18 CT concerning for perforated appendicitis and patient may need surgery; therefore, she was made NPO again.  Pharmacy consulted to manage TPN for ileus and prolonged NPO status.  GI: cirrhosis with portal venous HTN. Prealbumin remains <5, LBM 4/27, minimal abscess drain outpt.  Still having abd pain and nausea. 4/28 CT shows probable pSBO, may need ex-lap with ileocecal resection and ileostomy. Docusate, lactulose/rifaximin, PRN Zofran Endo: no hx DM - CBGs borderline high, likely related to PO intake Insulin requirements in the past 24 hours: 9 units SSI Lytes: Na low normal, K up to 5, others WNL Renal: SCr up 1.07, BUN 43 - UOP 0.9 ml/kg/hr Pulm: remains on 2L [place] Cards: Rheumatic heart dz - BP controlled, tachy - digoxin, Lasix IV, IV metoprolol AC: Coumadin PTA for Afib / mech valves >> Heparin, s/p Vit K - hgb low, plts WNL, INR 1.62.  Coughing up blood clots/nosebleeds 4/28 Hepatobil: LFTs / TG WNL, tbili mildly elevated at 1.8 Neuro: trazodone, PRN morphine, pain score 5-10 ID: Vanc/Zosyn for appendiceal abscess/enteritis - afebrile, WBC up to 36.1 TPN Access: PICC placed 01/23/18 TPN start date: 01/23/18  Nutritional Goals (per RD 4/25): 1365-1575 kCal, 81-93 g protein per day, 1.5-1.6 L fluid/day   Current Nutrition:  Dysphagia 2 diet (0-20% of meal intake charted) Boost BID (received 1 yesterday) TPN   Plan:  Continue TPN at 65 ml/hr, providing 84g AA, 250g CHO, and 38g ILE for a total of 1,560 kCal, meeting 100% of patient needs Electrolytes in TPN: increase  Na, decrease K / Phos, no change Mg/Ca, change Cl:Ac to 2:1 Daily multivitamin and trace elements in TPN Continue moderate SSI Q4H.  Watch CBGs and may need to increase SSI vs add insulin to TPN. F/U surgical plans vs ability to tolerate diet / supplements to start tapering TPN   [name] Chung D. Mina Marble, PharmD, BCPS, BCCCP Pager:  [phone] 01/31/2018, 8:36 AM

## 2018-01-31 NOTE — Progress Notes (Signed)
NG tube placed via right nare.  Connected to low intermittent suction, small about brown bilious drainage.  Patient tolerated well.

## 2018-02-01 ENCOUNTER — Inpatient Hospital Stay (HOSPITAL_COMMUNITY): Payer: Medicare Other | Admitting: Certified Registered Nurse Anesthetist

## 2018-02-01 ENCOUNTER — Inpatient Hospital Stay (HOSPITAL_COMMUNITY): Payer: Medicare Other

## 2018-02-01 ENCOUNTER — Encounter (HOSPITAL_COMMUNITY): Payer: Self-pay | Admitting: Certified Registered Nurse Anesthetist

## 2018-02-01 ENCOUNTER — Encounter (HOSPITAL_COMMUNITY): Admission: EM | Disposition: E | Payer: Self-pay | Source: Home / Self Care | Attending: Internal Medicine

## 2018-02-01 DIAGNOSIS — A419 Sepsis, unspecified organism: Principal | ICD-10-CM

## 2018-02-01 DIAGNOSIS — K3532 Acute appendicitis with perforation and localized peritonitis, without abscess: Secondary | ICD-10-CM

## 2018-02-01 DIAGNOSIS — K37 Unspecified appendicitis: Secondary | ICD-10-CM | POA: Insufficient documentation

## 2018-02-01 DIAGNOSIS — R6521 Severe sepsis with septic shock: Secondary | ICD-10-CM

## 2018-02-01 HISTORY — PX: BOWEL RESECTION: SHX1257

## 2018-02-01 HISTORY — PX: COLON RESECTION: SHX5231

## 2018-02-01 HISTORY — PX: APPENDECTOMY: SHX54

## 2018-02-01 HISTORY — PX: UNILATERAL SALPINGECTOMY: SHX6160

## 2018-02-01 LAB — COMPREHENSIVE METABOLIC PANEL
ALT: 12 U/L — AB (ref 14–54)
ANION GAP: 9 (ref 5–15)
AST: 30 U/L (ref 15–41)
Albumin: 1.8 g/dL — ABNORMAL LOW (ref 3.5–5.0)
Alkaline Phosphatase: 43 U/L (ref 38–126)
BUN: 45 mg/dL — ABNORMAL HIGH (ref 6–20)
CHLORIDE: 107 mmol/L (ref 101–111)
CO2: 28 mmol/L (ref 22–32)
CREATININE: 0.91 mg/dL (ref 0.44–1.00)
Calcium: 8.3 mg/dL — ABNORMAL LOW (ref 8.9–10.3)
GFR, EST NON AFRICAN AMERICAN: 57 mL/min — AB (ref >60)
Glucose, Bld: 179 mg/dL — ABNORMAL HIGH (ref 65–99)
POTASSIUM: 3.4 mmol/L — AB (ref 3.5–5.1)
SODIUM: 144 mmol/L (ref 135–145)
Total Bilirubin: 3.2 mg/dL — ABNORMAL HIGH (ref 0.3–1.2)
Total Protein: 4.5 g/dL — ABNORMAL LOW (ref 6.5–8.1)

## 2018-02-01 LAB — CBC
HCT: 26.2 % — ABNORMAL LOW (ref 36.0–46.0)
HEMATOCRIT: 24.9 % — AB (ref 36.0–46.0)
HEMOGLOBIN: 8 g/dL — AB (ref 12.0–15.0)
Hemoglobin: 8.6 g/dL — ABNORMAL LOW (ref 12.0–15.0)
MCH: 33.3 pg (ref 26.0–34.0)
MCH: 31.6 pg (ref 26.0–34.0)
MCHC: 32.1 g/dL (ref 30.0–36.0)
MCHC: 32.8 g/dL (ref 30.0–36.0)
MCV: 103.8 fL — AB (ref 78.0–100.0)
MCV: 96.3 fL (ref 78.0–100.0)
Platelets: 184 10*3/uL (ref 150–400)
Platelets: 160 10*3/uL (ref 150–400)
RBC: 2.4 MIL/uL — ABNORMAL LOW (ref 3.87–5.11)
RBC: 2.72 MIL/uL — ABNORMAL LOW (ref 3.87–5.11)
RDW: 20.1 % — AB (ref 11.5–15.5)
RDW: 17.1 % — AB (ref 11.5–15.5)
WBC: 36.6 10*3/uL — ABNORMAL HIGH (ref 4.0–10.5)
WBC: 32 10*3/uL — ABNORMAL HIGH (ref 4.0–10.5)

## 2018-02-01 LAB — FIBRINOGEN: FIBRINOGEN: 254 mg/dL (ref 210–475)

## 2018-02-01 LAB — GLUCOSE, CAPILLARY
Glucose-Capillary: 171 mg/dL — ABNORMAL HIGH (ref 65–99)
Glucose-Capillary: 197 mg/dL — ABNORMAL HIGH (ref 65–99)
Glucose-Capillary: 266 mg/dL — ABNORMAL HIGH (ref 65–99)

## 2018-02-01 LAB — POCT I-STAT 7, (LYTES, BLD GAS, ICA,H+H)
ACID-BASE EXCESS: 5 mmol/L — AB (ref 0.0–2.0)
Acid-Base Excess: 1 mmol/L (ref 0.0–2.0)
BICARBONATE: 26.5 mmol/L (ref 20.0–28.0)
Bicarbonate: 29.2 mmol/L — ABNORMAL HIGH (ref 20.0–28.0)
CALCIUM ION: 1.06 mmol/L — AB (ref 1.15–1.40)
Calcium, Ion: 1.18 mmol/L (ref 1.15–1.40)
HCT: 23 % — ABNORMAL LOW (ref 36.0–46.0)
HEMATOCRIT: 21 % — AB (ref 36.0–46.0)
Hemoglobin: 7.1 g/dL — ABNORMAL LOW (ref 12.0–15.0)
Hemoglobin: 7.8 g/dL — ABNORMAL LOW (ref 12.0–15.0)
O2 SAT: 99 %
O2 SAT: 99 %
PH ART: 7.444 (ref 7.350–7.450)
PH ART: 7.392 (ref 7.350–7.450)
PO2 ART: 122 mmHg — AB (ref 83.0–108.0)
PO2 ART: 153 mmHg — AB (ref 83.0–108.0)
POTASSIUM: 3.4 mmol/L — AB (ref 3.5–5.1)
Patient temperature: 36
Potassium: 3.7 mmol/L (ref 3.5–5.1)
SODIUM: 146 mmol/L — AB (ref 135–145)
Sodium: 145 mmol/L (ref 135–145)
TCO2: 30 mmol/L (ref 22–32)
TCO2: 28 mmol/L (ref 22–32)
pCO2 arterial: 42.6 mmHg (ref 32.0–48.0)
pCO2 arterial: 43.1 mmHg (ref 32.0–48.0)

## 2018-02-01 LAB — BLOOD GAS, ARTERIAL
Acid-Base Excess: 3.3 mmol/L — ABNORMAL HIGH (ref 0.0–2.0)
BICARBONATE: 28.4 mmol/L — AB (ref 20.0–28.0)
Drawn by: 347621
FIO2: 100
LHR: 14 {breaths}/min
O2 Saturation: 99.4 %
PATIENT TEMPERATURE: 97.5
PEEP: 5 cmH2O
VT: 470 mL
pCO2 arterial: 51.2 mmHg — ABNORMAL HIGH (ref 32.0–48.0)
pH, Arterial: 7.36 (ref 7.350–7.450)
pO2, Arterial: 224 mmHg — ABNORMAL HIGH (ref 83.0–108.0)

## 2018-02-01 LAB — BASIC METABOLIC PANEL
Anion gap: 11 (ref 5–15)
BUN: 44 mg/dL — ABNORMAL HIGH (ref 6–20)
CALCIUM: 8.3 mg/dL — AB (ref 8.9–10.3)
CHLORIDE: 102 mmol/L (ref 101–111)
CO2: 27 mmol/L (ref 22–32)
CREATININE: 1.01 mg/dL — AB (ref 0.44–1.00)
GFR calc non Af Amer: 50 mL/min — ABNORMAL LOW (ref >60)
GFR, EST AFRICAN AMERICAN: 58 mL/min — AB (ref >60)
GLUCOSE: 175 mg/dL — AB (ref 65–99)
Potassium: 3.6 mmol/L (ref 3.5–5.1)
Sodium: 140 mmol/L (ref 135–145)

## 2018-02-01 LAB — PROTIME-INR
INR: 1.74
INR: 1.73
PROTHROMBIN TIME: 20.1 s — AB (ref 11.4–15.2)
Prothrombin Time: 20.2 seconds — ABNORMAL HIGH (ref 11.4–15.2)

## 2018-02-01 LAB — PREPARE RBC (CROSSMATCH)

## 2018-02-01 LAB — LACTIC ACID, PLASMA: LACTIC ACID, VENOUS: 1.6 mmol/L (ref 0.5–1.9)

## 2018-02-01 LAB — HEPARIN LEVEL (UNFRACTIONATED): Heparin Unfractionated: 0.44 IU/mL (ref 0.30–0.70)

## 2018-02-01 SURGERY — COLON RESECTION
Anesthesia: General | Site: Abdomen | Laterality: Right

## 2018-02-01 MED ORDER — MIDAZOLAM HCL 2 MG/2ML IJ SOLN
1.0000 mg | INTRAMUSCULAR | Status: DC | PRN
  Administered 2018-02-02: 1 mg via INTRAVENOUS
  Filled 2018-02-01: qty 2

## 2018-02-01 MED ORDER — ALBUMIN HUMAN 5 % IV SOLN
INTRAVENOUS | Status: DC | PRN
Start: 2018-02-01 — End: 2018-02-01
  Administered 2018-02-01 (×2): via INTRAVENOUS

## 2018-02-01 MED ORDER — ONDANSETRON HCL 4 MG/2ML IJ SOLN: 4.0000 mg | Freq: Once | INTRAMUSCULAR | Status: DC | PRN

## 2018-02-01 MED ORDER — PROPOFOL 10 MG/ML IV BOLUS
INTRAVENOUS | Status: AC
  Filled 2018-02-01: qty 20

## 2018-02-01 MED ORDER — ROCURONIUM BROMIDE 10 MG/ML (PF) SYRINGE
PREFILLED_SYRINGE | INTRAVENOUS | Status: AC
  Filled 2018-02-01: qty 5

## 2018-02-01 MED ORDER — FENTANYL CITRATE (PF) 100 MCG/2ML IJ SOLN
50.0000 ug | Freq: Once | INTRAMUSCULAR | Status: DC
  Filled 2018-02-01: qty 2

## 2018-02-01 MED ORDER — ORAL CARE MOUTH RINSE
15.0000 mL | OROMUCOSAL | Status: DC
  Administered 2018-02-01 – 2018-02-03 (×12): 15 mL via OROMUCOSAL

## 2018-02-01 MED ORDER — FENTANYL CITRATE (PF) 250 MCG/5ML IJ SOLN
INTRAMUSCULAR | Status: DC | PRN
  Administered 2018-02-01: 100 ug via INTRAVENOUS
  Administered 2018-02-01 (×3): 50 ug via INTRAVENOUS
  Administered 2018-02-01: 100 ug via INTRAVENOUS
  Administered 2018-02-01 (×2): 75 ug via INTRAVENOUS

## 2018-02-01 MED ORDER — FENTANYL CITRATE (PF) 250 MCG/5ML IJ SOLN
INTRAMUSCULAR | Status: AC
  Filled 2018-02-01: qty 5

## 2018-02-01 MED ORDER — 0.9 % SODIUM CHLORIDE (POUR BTL) OPTIME
TOPICAL | Status: DC | PRN
  Administered 2018-02-01: 1000 mL
  Administered 2018-02-01: 3000 mL
  Administered 2018-02-01: 2000 mL
  Administered 2018-02-01: 1000 mL

## 2018-02-01 MED ORDER — VASOPRESSIN 20 UNIT/ML IV SOLN
0.0300 [IU]/min | INTRAVENOUS | Status: DC
  Administered 2018-02-01: 0.03 [IU]/min via INTRAVENOUS
  Filled 2018-02-01: qty 2

## 2018-02-01 MED ORDER — ONDANSETRON HCL 4 MG/2ML IJ SOLN
INTRAMUSCULAR | Status: AC
  Filled 2018-02-01: qty 2

## 2018-02-01 MED ORDER — CALCIUM CHLORIDE 10 % IV SOLN
INTRAVENOUS | Status: DC | PRN
  Administered 2018-02-01 (×5): 200 mg via INTRAVENOUS

## 2018-02-01 MED ORDER — FENTANYL CITRATE (PF) 100 MCG/2ML IJ SOLN: 50.0000 ug | INTRAMUSCULAR | Status: DC | PRN

## 2018-02-01 MED ORDER — DEXAMETHASONE SODIUM PHOSPHATE 10 MG/ML IJ SOLN
INTRAMUSCULAR | Status: AC
  Filled 2018-02-01: qty 1

## 2018-02-01 MED ORDER — SUCCINYLCHOLINE CHLORIDE 200 MG/10ML IV SOSY
PREFILLED_SYRINGE | INTRAVENOUS | Status: AC
  Filled 2018-02-01: qty 10

## 2018-02-01 MED ORDER — CALCIUM CHLORIDE 10 % IV SOLN
INTRAVENOUS | Status: AC
  Filled 2018-02-01: qty 10

## 2018-02-01 MED ORDER — SODIUM CHLORIDE 0.9 % IV SOLN: Freq: Once | INTRAVENOUS | Status: DC

## 2018-02-01 MED ORDER — HEMOSTATIC AGENTS (NO CHARGE) OPTIME
TOPICAL | Status: DC | PRN
  Administered 2018-02-01: 1 via TOPICAL

## 2018-02-01 MED ORDER — CHLORHEXIDINE GLUCONATE 0.12% ORAL RINSE (MEDLINE KIT)
15.0000 mL | Freq: Two times a day (BID) | OROMUCOSAL | Status: DC
  Administered 2018-02-01 – 2018-02-03 (×4): 15 mL via OROMUCOSAL

## 2018-02-01 MED ORDER — BISACODYL 10 MG RE SUPP: 10.0000 mg | Freq: Every day | RECTAL | Status: DC | PRN

## 2018-02-01 MED ORDER — DEXTROSE 5 % IV SOLN
0.0000 ug/min | INTRAVENOUS | Status: DC
  Administered 2018-02-01: 8 ug/min via INTRAVENOUS
  Administered 2018-02-02: 11 ug/min via INTRAVENOUS
  Filled 2018-02-01 (×2): qty 4

## 2018-02-01 MED ORDER — DEXMEDETOMIDINE HCL IN NACL 200 MCG/50ML IV SOLN
INTRAVENOUS | Status: DC | PRN
  Administered 2018-02-01: 1 ug/kg/h via INTRAVENOUS

## 2018-02-01 MED ORDER — OXYCODONE HCL 5 MG/5ML PO SOLN
5.0000 mg | Freq: Once | ORAL | Status: DC | PRN
Start: 2018-02-01 — End: 2018-02-01

## 2018-02-01 MED ORDER — FENTANYL CITRATE (PF) 100 MCG/2ML IJ SOLN: 25.0000 ug | INTRAMUSCULAR | Status: DC | PRN

## 2018-02-01 MED ORDER — LIDOCAINE 2% (20 MG/ML) 5 ML SYRINGE
INTRAMUSCULAR | Status: AC
  Filled 2018-02-01: qty 10

## 2018-02-01 MED ORDER — OXYCODONE HCL 5 MG PO TABS: 5.0000 mg | ORAL_TABLET | Freq: Once | ORAL | Status: DC | PRN

## 2018-02-01 MED ORDER — TRAVASOL 10 % IV SOLN
INTRAVENOUS | Status: AC
  Administered 2018-02-01: 21:00:00 via INTRAVENOUS
  Filled 2018-02-01: qty 842.4

## 2018-02-01 MED ORDER — PHENYLEPHRINE HCL 10 MG/ML IJ SOLN
INTRAVENOUS | Status: DC | PRN
  Administered 2018-02-01: 80 ug/min via INTRAVENOUS

## 2018-02-01 MED ORDER — DILTIAZEM LOAD VIA INFUSION
INTRAVENOUS | Status: DC | PRN
  Administered 2018-02-01: 10 mg via INTRAVENOUS

## 2018-02-01 MED ORDER — VASOPRESSIN 20 UNIT/ML IV SOLN
INTRAVENOUS | Status: DC | PRN
  Administered 2018-02-01: .03 [IU]/min via INTRAVENOUS

## 2018-02-01 MED ORDER — NOREPINEPHRINE BITARTRATE 1 MG/ML IV SOLN
INTRAVENOUS | Status: DC | PRN
  Administered 2018-02-01: 2 ug/min via INTRAVENOUS

## 2018-02-01 MED ORDER — DILTIAZEM HCL 100 MG IV SOLR
5.0000 mg/h | INTRAVENOUS | Status: DC
  Administered 2018-02-01: 5 mg/h via INTRAVENOUS
  Filled 2018-02-01 (×2): qty 100

## 2018-02-01 MED ORDER — VASOPRESSIN 20 UNIT/ML IV SOLN
INTRAVENOUS | Status: AC
  Filled 2018-02-01: qty 1

## 2018-02-01 MED ORDER — FENTANYL 2500MCG IN NS 250ML (10MCG/ML) PREMIX INFUSION
INTRAVENOUS | Status: AC
  Administered 2018-02-01: 50 ug/h via INTRAVENOUS
  Filled 2018-02-01: qty 250

## 2018-02-01 MED ORDER — VASOPRESSIN 20 UNIT/ML IV SOLN
INTRAVENOUS | Status: DC | PRN
Start: 2018-02-01 — End: 2018-02-01
  Administered 2018-02-01: 2 [IU] via INTRAVENOUS

## 2018-02-01 MED ORDER — PROPOFOL 10 MG/ML IV BOLUS
INTRAVENOUS | Status: DC | PRN
  Administered 2018-02-01: 90 mg via INTRAVENOUS

## 2018-02-01 MED ORDER — FENTANYL BOLUS VIA INFUSION
25.0000 ug | INTRAVENOUS | Status: DC | PRN
  Administered 2018-02-02 (×2): 25 ug via INTRAVENOUS
  Filled 2018-02-01: qty 25

## 2018-02-01 MED ORDER — SUGAMMADEX SODIUM 200 MG/2ML IV SOLN
INTRAVENOUS | Status: AC
  Filled 2018-02-01: qty 2

## 2018-02-01 MED ORDER — SUCCINYLCHOLINE CHLORIDE 200 MG/10ML IV SOSY
PREFILLED_SYRINGE | INTRAVENOUS | Status: AC
Start: 2018-02-01 — End: 2018-02-01
  Filled 2018-02-01: qty 10

## 2018-02-01 MED ORDER — ROCURONIUM BROMIDE 10 MG/ML (PF) SYRINGE
PREFILLED_SYRINGE | INTRAVENOUS | Status: DC | PRN
Start: 2018-02-01 — End: 2018-02-01
  Administered 2018-02-01 (×3): 50 mg via INTRAVENOUS

## 2018-02-01 MED ORDER — SODIUM CHLORIDE 0.9 % IV SOLN
INTRAVENOUS | Status: DC | PRN
  Administered 2018-02-01: 13:00:00 via INTRAVENOUS

## 2018-02-01 MED ORDER — DEXMEDETOMIDINE HCL IN NACL 200 MCG/50ML IV SOLN
0.4000 ug/kg/h | INTRAVENOUS | Status: DC
  Administered 2018-02-01 (×2): 1 ug/kg/h via INTRAVENOUS
  Filled 2018-02-01 (×2): qty 50

## 2018-02-01 MED ORDER — NOREPINEPHRINE BITARTRATE 1 MG/ML IV SOLN
0.0000 ug/min | INTRAVENOUS | Status: DC
  Administered 2018-02-01: 2 ug/min via INTRAVENOUS
  Filled 2018-02-01: qty 4

## 2018-02-01 MED ORDER — ROCURONIUM BROMIDE 10 MG/ML (PF) SYRINGE
PREFILLED_SYRINGE | INTRAVENOUS | Status: AC
  Filled 2018-02-01: qty 10

## 2018-02-01 MED ORDER — LACTATED RINGERS IV SOLN
INTRAVENOUS | Status: DC | PRN
  Administered 2018-02-01: 12:00:00 via INTRAVENOUS

## 2018-02-01 MED ORDER — FENTANYL 2500MCG IN NS 250ML (10MCG/ML) PREMIX INFUSION
0.0000 ug/h | INTRAVENOUS | Status: DC
  Administered 2018-02-01: 50 ug/h via INTRAVENOUS

## 2018-02-01 MED ORDER — FENTANYL 2500MCG IN NS 250ML (10MCG/ML) PREMIX INFUSION
25.0000 ug/h | INTRAVENOUS | Status: DC
  Administered 2018-02-01: 50 ug/h via INTRAVENOUS

## 2018-02-01 SURGICAL SUPPLY — 69 items
BIOPATCH RED 1 DISK 7.0 (GAUZE/BANDAGES/DRESSINGS) ×3 IMPLANT
BIOPATCH RED 1IN DISK 7.0MM (GAUZE/BANDAGES/DRESSINGS) ×1
BLADE CLIPPER SURG (BLADE) IMPLANT
CANISTER SUCT 3000ML PPV (MISCELLANEOUS) ×4 IMPLANT
CANISTER WOUND CARE 500ML ATS (WOUND CARE) ×4 IMPLANT
CHLORAPREP W/TINT 26ML (MISCELLANEOUS) ×4 IMPLANT
COVER MAYO STAND STRL (DRAPES) ×4 IMPLANT
COVER SURGICAL LIGHT HANDLE (MISCELLANEOUS) ×8 IMPLANT
DRAPE HALF SHEET 40X57 (DRAPES) ×4 IMPLANT
DRAPE LAPAROSCOPIC ABDOMINAL (DRAPES) ×4 IMPLANT
DRAPE UTILITY XL STRL (DRAPES) ×4 IMPLANT
DRAPE WARM FLUID 44X44 (DRAPE) ×4 IMPLANT
DRSG OPSITE POSTOP 4X10 (GAUZE/BANDAGES/DRESSINGS) IMPLANT
DRSG OPSITE POSTOP 4X8 (GAUZE/BANDAGES/DRESSINGS) IMPLANT
DRSG TEGADERM 2-3/8X2-3/4 SM (GAUZE/BANDAGES/DRESSINGS) ×4 IMPLANT
DRSG TEGADERM 4X4.75 (GAUZE/BANDAGES/DRESSINGS) ×4 IMPLANT
DRSG VAC ATS LRG SENSATRAC (GAUZE/BANDAGES/DRESSINGS) ×4 IMPLANT
ELECT BLADE 6.5 EXT (BLADE) ×4 IMPLANT
ELECT CAUTERY BLADE 6.4 (BLADE) ×8 IMPLANT
ELECT REM PT RETURN 9FT ADLT (ELECTROSURGICAL) ×4
ELECTRODE REM PT RTRN 9FT ADLT (ELECTROSURGICAL) ×2 IMPLANT
GAUZE SPONGE 2X2 8PLY STRL LF (GAUZE/BANDAGES/DRESSINGS) ×2 IMPLANT
GLOVE BIO SURGEON STRL SZ 6 (GLOVE) ×8 IMPLANT
GLOVE BIO SURGEON STRL SZ7 (GLOVE) ×8 IMPLANT
GLOVE BIOGEL PI IND STRL 6.5 (GLOVE) ×2 IMPLANT
GLOVE BIOGEL PI IND STRL 7.5 (GLOVE) ×4 IMPLANT
GLOVE BIOGEL PI INDICATOR 6.5 (GLOVE) ×2
GLOVE BIOGEL PI INDICATOR 7.5 (GLOVE) ×4
GOWN STRL REUS W/ TWL LRG LVL3 (GOWN DISPOSABLE) ×4 IMPLANT
GOWN STRL REUS W/TWL 2XL LVL3 (GOWN DISPOSABLE) ×4 IMPLANT
GOWN STRL REUS W/TWL LRG LVL3 (GOWN DISPOSABLE) ×8
HEMOSTAT ARISTA ABSORB 3G PWDR (MISCELLANEOUS) ×4 IMPLANT
KIT BASIN OR (CUSTOM PROCEDURE TRAY) ×4 IMPLANT
KIT TURNOVER KIT B (KITS) ×4 IMPLANT
LEGGING LITHOTOMY PAIR STRL (DRAPES) IMPLANT
LIGASURE IMPACT 36 18CM CVD LR (INSTRUMENTS) ×4 IMPLANT
NS IRRIG 1000ML POUR BTL (IV SOLUTION) ×8 IMPLANT
PACK GENERAL/GYN (CUSTOM PROCEDURE TRAY) ×4 IMPLANT
PAD ARMBOARD 7.5X6 YLW CONV (MISCELLANEOUS) ×4 IMPLANT
PENCIL BUTTON HOLSTER BLD 10FT (ELECTRODE) ×4 IMPLANT
RELOAD PROXIMATE 30MM BLUE (ENDOMECHANICALS) ×4 IMPLANT
RELOAD PROXIMATE 75MM BLUE (ENDOMECHANICALS) ×8 IMPLANT
RELOAD STAPLER LINEAR PROX 30 (STAPLE) ×2 IMPLANT
RETAINER VISCERA MED (MISCELLANEOUS) ×4 IMPLANT
SPONGE GAUZE 2X2 STER 10/PKG (GAUZE/BANDAGES/DRESSINGS) ×2
SPONGE LAP 18X18 X RAY DECT (DISPOSABLE) ×16 IMPLANT
STAPLER GUN LINEAR PROX 60 (STAPLE) ×4 IMPLANT
STAPLER PROXIMATE 75MM BLUE (STAPLE) ×4 IMPLANT
STAPLER RELOAD LINEAR PROX 30 (STAPLE) ×4
STAPLER VISISTAT 35W (STAPLE) ×4 IMPLANT
SUCTION POOLE TIP (SUCTIONS) ×4 IMPLANT
SURGILUBE 2OZ TUBE FLIPTOP (MISCELLANEOUS) IMPLANT
SUT ETHILON 2 0 FS 18 (SUTURE) ×4 IMPLANT
SUT NOVA NAB DX-16 0-1 5-0 T12 (SUTURE) ×12 IMPLANT
SUT PDS AB 1 TP1 96 (SUTURE) ×8 IMPLANT
SUT PROLENE 2 0 CT2 30 (SUTURE) IMPLANT
SUT PROLENE 2 0 KS (SUTURE) IMPLANT
SUT SILK 2 0 SH CR/8 (SUTURE) ×4 IMPLANT
SUT SILK 2 0 TIES 10X30 (SUTURE) ×4 IMPLANT
SUT SILK 3 0 SH CR/8 (SUTURE) ×8 IMPLANT
SUT SILK 3 0 TIES 10X30 (SUTURE) ×4 IMPLANT
SUT VIC AB 3-0 SH 18 (SUTURE) ×4 IMPLANT
SYR BULB IRRIGATION 50ML (SYRINGE) ×4 IMPLANT
TOWEL OR 17X26 10 PK STRL BLUE (TOWEL DISPOSABLE) ×4 IMPLANT
TRAY FOLEY MTR SLVR 16FR STAT (SET/KITS/TRAYS/PACK) IMPLANT
TRAY PROCTOSCOPIC FIBER OPTIC (SET/KITS/TRAYS/PACK) IMPLANT
TUBE CONNECTING 12'X1/4 (SUCTIONS) ×1
TUBE CONNECTING 12X1/4 (SUCTIONS) ×3 IMPLANT
YANKAUER SUCT BULB TIP NO VENT (SUCTIONS) ×4 IMPLANT

## 2018-02-01 NOTE — Anesthesia Preprocedure Evaluation (Signed)
Anesthesia Evaluation  Patient identified by MRN, date of birth, ID band Patient awake    Reviewed: Allergy & Precautions, NPO status , Patient's Chart, lab work & pertinent test results  Airway Mallampati: II  TM Distance: >3 FB Neck ROM: Limited    Dental  (+) Teeth Intact, Dental Advisory Given   Pulmonary     + decreased breath sounds+ wheezing      Cardiovascular  Rhythm:Irregular Rate:Tachycardia     Neuro/Psych    GI/Hepatic   Endo/Other    Renal/GU      Musculoskeletal   Abdominal   Peds  Hematology   Anesthesia Other Findings   Reproductive/Obstetrics                             Anesthesia Physical Anesthesia Plan  ASA: IV and emergent  Anesthesia Plan: General   Post-op Pain Management:    Induction: Intravenous  PONV Risk Score and Plan:   Airway Management Planned: Oral ETT  Additional Equipment: Arterial line, CVP and Ultrasound Guidance Line Placement  Intra-op Plan:   Post-operative Plan: Post-operative intubation/ventilation  Informed Consent: I have reviewed the patients History and Physical, chart, labs and discussed the procedure including the risks, benefits and alternatives for the proposed anesthesia with the patient or authorized representative who has indicated his/her understanding and acceptance.   Dental advisory given  Plan Discussed with: CRNA and Anesthesiologist  Anesthesia Plan Comments:         Anesthesia Quick Evaluation

## 2018-02-01 NOTE — Progress Notes (Signed)
Palliative consult received. I reviewed patient's chart and had set a meeting time with patient's HCPOA- Lorriane Shire for 1230.  Upon going to patient's room- patient had already been taken for surgery. I called Lorriane Shire and left a message requesting return call for alternate meeting time/place.   Mariana Kaufman, AGNP-C Palliative Medicine  Please call Palliative Medicine team phone with any questions [phone]. For individual providers please see AMION.

## 2018-02-01 NOTE — Progress Notes (Signed)
Nutrition Follow-up  DOCUMENTATION CODES:   Not applicable  INTERVENTION:  TPN per pharmacy  Monitor for diet advancement following surgery  NUTRITION DIAGNOSIS:   Inadequate oral intake related to acute illness(ileus) as evidenced by per patient/family report. -ongoing  GOAL:   Patient will meet greater than or equal to 90% of their needs -met with TPN  MONITOR:   Diet advancement, PO intake, Supplement acceptance, Weight trends  ASSESSMENT:   Ms. Wunschel is an 84 yo female with PMH dementia, chronic A-fib, mechanical AVR & MVR replacement on coumadin, chronic diastolic CHF, Stg III CKD presented to ED 01/27/2018 with 3 days of nausea, nonbloody emesis, abdominal distension, and pain. She was seen by PCP started on cipro for presumed UTI. UA returned negative, so Cipro was stopped, patient also reported diarrhea. CT abdomen 4/20 suggested perforated appendicular abscess and ileus.  Surgery ordered PICC Line and TPN. Currently, vitamin K is being used to reverse INR so IR can place percutaneous drain of abscess.  4/22 - started on TPN for ileus and appendicular abcess 4/24 - s/p Perc drain 4/25 - Clear liquid diet started 4/26 - advanced to NDD2, nectar thick liquids 4/28 - CT abd exhibited SBO with thickening of duodenum and proximal small bowel 4/30 (Today) - underwent ex lap, appendectomy, small bowel resection (30cm of ischemic patches removed), right salpingectomy  Met with patient this AM prior to her going to surgery She continued to be confused. Abdomen continued to be distended, firm. Per patient's aide and RN she did not consume boost breeze that was ordered for her while diet was advanced. Will continue to monitor for needs following surgery, currently in PACU.  Labs reviewed LFTs - AST 42, ALT 12 TBili 1.8 Lytes WNL, Triglycerides WNL  Medications reviewed and include:  Lactulose, Insulin, Colace Cardizem gtt  Diet Order:   Diet Order           Diet NPO  time specified  Diet effective now          EDUCATION NEEDS:   Not appropriate for education at this time  Skin:  Skin Assessment: Reviewed RN Assessment(abrasion to L foot)  Last BM:  01/29/2018 (type 7)  Height:   Ht Readings from Last 1 Encounters:  01/29/2018 5' 5.5" (1.664 m)    Weight:   Wt Readings from Last 1 Encounters:  01/09/2018 162 lb (73.5 kg)    Ideal Body Weight:  58 kg  BMI:  Body mass index is 26.55 kg/m.  Estimated Nutritional Needs:   Kcal:  9629-5284 calories (MSJ x1.3-1.5)  Protein:  81-93 grams (1.4-1.6g/kg)  Fluid:  1.5-1.6L  Satira Anis. Solmon Bohr, MS, RD LDN Inpatient Clinical Dietitian Pager [phone]

## 2018-02-01 NOTE — Progress Notes (Signed)
Attempt to call report.

## 2018-02-01 NOTE — Progress Notes (Signed)
PROGRESS NOTE    [name] Chung  KVQ:[id number] DOB: 1933-07-19 DOA: 01/17/2018 PCP: Lajean Manes, MD   Brief Narrative: Patient is a 84 year old female with past medical history of chronic atrial fibrillation, mechanical atrial and mitral valves on Coumadin, chronic diastolic heart failure and stage III chronic kidney disease admitted on 4/17 after presenting with 3 days of nausea, vomiting and abdominal distention and had recent treatment for UTI.  In emergency room, patient had a CT suggestive of small bowel obstruction.  General surgery consulted and when conservative measures failed, they recommended follow-up CT of the abdomen which noted perforated appendix with abscess.  Patient's INR reversed with vitamin K and she underwent by interventional radiology a CT-guided percutaneous drainage of the abscess.  Cardiology during her hospitalization has been consulted for decompensated CHF and A. fib with RVR.  Her hospital course is complicated with severe leukocytosis, ongoing abdominal pain. She does not have  fever.  Urine unremarkable.  Chest x-ray notes lateral pleural effusions.   CT abdomen/pelvis with contrast done on 01/30/18.  She is being planned for explorative laparotomy  right hemicolectomy/ probable ileostomy today.   Assessment & Plan:   Principal Problem:   Partial small bowel obstruction (HCC) Active Problems:   Chronic atrial fibrillation (HCC)   Long term current use of anticoagulant therapy   S/P MVR (mitral valve replacement)   S/P AVR (aortic valve replacement)   Acute renal failure superimposed on stage 3 chronic kidney disease (HCC)   Atrial fibrillation with RVR (HCC)   Chronic systolic (congestive) heart failure (HCC)   Hyponatremia   Sepsis (HCC)   Liver cirrhosis (HCC)   Leukocytosis   Complex appendiceal abscess: Initially presented with possible small bowel obstruction.  Status post drainage.  Surgery following. Continue IV Zosyn .  On TPN per  pharmacy. CT abdomen and pelvis showed dilated small bowel concerning for small bowel obstruction, thickening of the duodenum and proximal small bowel concerning for enteritis, persistent fluid collection in the pelvis, large right and moderate left pleural effusion cirrhosis and portal venous hypertension.  NG tube put on 01/31/18. She is being planned for explorative laparotomy  right hemicolectomy/ probable ileostomy today  Leukocytosis: Unclear etiology.  Urine does not look to be a source.  Procalcitonin level mildly elevated.  Possible colonic bacterial translocation? No sign of any cellulitis. Continue antibiotics.On Zosyn  Left foot pain: Unusual.  Quite tender and more around the left lateral malleolar area. Foot itself is swollen from edema and anasarca.  No heel or toe pain and patient is quite specific where the pain is.  No surrounding erythema or induration.  Uric acid level normal  Chronic A-fib/MVR/AVR: Cardiology following.  On IV Lopressor and digoxin.  On heparin bridge with Coumadin held.  Heart rate controlled.  Chads 2 score of 5.  She is being transfused with 2 units of FFP for elevated INR today.  Acute on chronic diastolic heart failure: She has severe anasarca. echocardiogram done November 2018 notes normal ejection fraction.  On IV Lasix .  Creatinine remaines stable.  Cardiology following  Hypokalemia: Supplemented and corrected  Cirrhosis with portal hypertension:  Stable.   Anemia of chronic disease: Likely Secondary to advanced liver disease.No signs of bleeding.  We will continue to monitor her CBC.  Transfused with w 1 unit of PRBC on 01/31/18.  Dysphagia:  Currently n.p.o. given worsening abdominal distention  and anticipation for surgery.  Poor prognosis/goals of care: I have requested consultation for palliative care to  discuss goals of care.  Patient has poor prognosis and she has high risk for mortality  .    DVT prophylaxis: Heparin drip Code  Status: Full Family Communication: Caregiver on the bedside Disposition Plan: Undetermined at this point   Consultants: General surgery, cardiology, IR  Procedures: Appendiceal abscess drain tube placement on 4/24  Antimicrobials: IV Zosyn since 4/17 Vancomycin 4/26/-4/29  Subjective:  Patient seen and examined at bedside this morning.  Very lethargic, confused .On mild to moderate respiratory distress.  Severely distended abdomen.   objective: Vitals:   01/06/2018 1015 01/16/2018 1024 01/13/2018 1048 01/17/2018 1121  BP:  138/74 (!) 153/62 (!) 151/85  Pulse:   (!) 122 (!) 126  Resp:   (!) 30 (!) 30  Temp:  97.9 F (36.6 C) 98.6 F (37 C) 98.1 F (36.7 C)  TempSrc: Oral Oral Oral Oral  SpO2:   93% 95%  Weight:      Height:        Intake/Output Summary (Last 24 hours) at 01/17/2018 1259 Last data filed at 01/31/2018 1200 Gross per 24 hour  Intake 2261.92 ml  Output 2610 ml  Net -348.08 ml   Filed Weights   01/28/18 0500 01/30/18 0304 01/26/2018 0500  Weight: 61.7 kg (136 lb) 64.4 kg (142 lb) 73.5 kg (162 lb)    Examination:  General exam: Chronically ill looking , in in mild to moderate distress due to abdominal discomfort, critically ill HEENT:PERRL,Oral mucosa moist, Ear/Nose normal on gross exam Respiratory system: Bilateral decreased air entry Cardiovascular system: S1 & S2 heard, irregularly irregular. No JVD, murmurs, rubs, gallops or clicks. Ansarca Gastrointestinal system: Abdomen is very distended, mild generalized tenderness. No organomegaly or masses felt.  Sluggish bowel sounds. Central nervous system: Confused, lethargic.   Extremities: 2-3+ pitting  edema, no clubbing ,no cyanosis, distal peripheral pulses palpable.  Tender left foot in the lateral malleolar area, no signs of erythema or induration Skin: No ulcers,no icterus ,no pallor   Data Reviewed: I have personally reviewed following labs and imaging studies  CBC: Recent Labs  Lab 01/26/18 0419   01/28/18 0446 01/29/18 0438 01/30/18 0512 01/31/18 0417 01/08/2018 0500  WBC 27.0*   < > 30.2* 29.9* 31.3* 36.1* 36.6*  NEUTROABS 21.9*  --   --   --  25.4* 28.8*  --   HGB 8.9*   < > 8.0* 7.7* 7.3* 7.9* 8.0*  HCT 26.3*   < > 24.4* 23.3* 21.6* 24.9* 24.9*  MCV 106.5*   < > 108.0* 107.9* 108.0* 102.9* 103.8*  PLT 195   < > 163 175 174 173 184   < > = values in this interval not displayed.   Basic Metabolic Panel: Recent Labs  Lab 01/26/18 0419 01/27/18 0234 01/29/18 0438 01/30/18 0512 01/31/18 0417 01/22/2018 0500  NA 146* 136 134* 135 135 140  K 4.2 3.9 4.1 4.5 5.0 3.6  CL 102 99* 96* 99* 101 102  CO2 40* 29 31 31 30 27   GLUCOSE 144* 177* 161* 147* 191* 175*  BUN 19 23* 31* 36* 43* 44*  CREATININE 0.94 0.92 0.93 0.97 1.07* 1.01*  CALCIUM 7.7* 7.9* 8.1* 8.0* 8.1* 8.3*  MG  --  1.9  --   --  2.2  --   PHOS 2.2* 3.0  --   --  4.0  --    GFR: Estimated Creatinine Clearance: 42.8 mL/min (A) (by C-G formula based on SCr of 1.01 mg/dL (H)). Liver Function Tests: Recent Labs  Lab  01/26/18 0419 01/27/18 0234 01/31/18 0417  AST  --  30 42*  ALT  --  12* 12*  ALKPHOS  --  70 69  BILITOT  --  1.2 1.8*  PROT  --  5.6* 6.5  ALBUMIN 1.6* 1.9* 1.8*   No results for input(s): LIPASE, AMYLASE in the last 168 hours. No results for input(s): AMMONIA in the last 168 hours. Coagulation Profile: Recent Labs  Lab 01/28/18 0446 01/29/18 0438 01/30/18 0512 01/31/18 0417 01/12/2018 0500  INR 1.56 1.57 1.71 1.62 1.74   Cardiac Enzymes: No results for input(s): CKTOTAL, CKMB, CKMBINDEX, TROPONINI in the last 168 hours. BNP (last 3 results) No results for input(s): PROBNP in the last 8760 hours. HbA1C: No results for input(s): HGBA1C in the last 72 hours. CBG: Recent Labs  Lab 01/31/18 0609 01/31/18 1247 01/31/18 1703 01/31/18 2348 01/28/2018 0630  GLUCAP 189* 178* 168* 150* 171*   Lipid Profile: Recent Labs    01/31/18 0417  TRIG 28   Thyroid Function Tests: No results  for input(s): TSH, T4TOTAL, FREET4, T3FREE, THYROIDAB in the last 72 hours. Anemia Panel: No results for input(s): VITAMINB12, FOLATE, FERRITIN, TIBC, IRON, RETICCTPCT in the last 72 hours. Sepsis Labs: Recent Labs  Lab 01/28/18 1721 01/29/18 0438 01/30/18 0512  PROCALCITON 0.60 0.54 0.62    Recent Results (from the past 240 hour(s))  MRSA PCR Screening     Status: None   Collection Time: 01/23/18  5:18 AM  Result Value Ref Range Status   MRSA by PCR NEGATIVE NEGATIVE Final    Comment:        The GeneXpert MRSA Assay (FDA approved for NASAL specimens only), is one component of a comprehensive MRSA colonization surveillance program. It is not intended to diagnose MRSA infection nor to guide or monitor treatment for MRSA infections. Performed at [facility], [place] [address], [place], [place] [address]   Aerobic/Anaerobic Culture (surgical/deep wound)     Status: None   Collection Time: 01/26/18  4:38 PM  Result Value Ref Range Status   Specimen Description ABSCESS ABDOMEN  Final   Special Requests NONE  Final   Gram Stain   Final    MODERATE WBC PRESENT, PREDOMINANTLY PMN NO ORGANISMS SEEN    Culture   Final    FEW ACTINOMYCES ODONTOLYTICUS Standardized susceptibility testing for this organism is not available. ABUNDANT BACTEROIDES FRAGILIS BETA LACTAMASE POSITIVE Performed at [facility], [place] [address] [place], [place] [address]    Report Status 01/31/2018 FINAL  Final         Radiology Studies: Dg Chest 1 View  Result Date: 01/04/2018 CLINICAL DATA:  84 year old female with evidence of small bowel obstruction in the pelvis on CT. Abnormal abdominal and pelvic fluid collections, percutaneous drainage catheter placed. NG tube may have been pulled back. EXAM: CHEST  1 VIEW COMPARISON:  KUB 01/31/2018 and earlier. FINDINGS: Portable AP semi upright view at 0815 hours. Enteric tube courses to the abdomen as before, tip not included. Veiling opacity  persists at the lung bases greater on the right. New coarse and confluent left mid and upper lung opacity since 01/28/2018. No definite new opacity in the right lung. No pneumothorax identified. Stable cardiomegaly and mediastinal contours. Prior valve replacement. IMPRESSION: 1. New confluent left upper lung opacity suspicious for a New Left Lung Pneumonia since 01/28/2018. 2. Continued right greater than left lower lung veiling opacities seen to reflect pleural effusions and atelectasis on the most recent CT  Abdomen and Pelvis. Electronically Signed   By: Genevie Ann M.D.   On: 01/23/2018 10:03   Ct Abdomen Pelvis W Contrast  Result Date: 01/30/2018 CLINICAL DATA:  Patient with abdominal pain. Evaluate for bowel obstruction. EXAM: CT ABDOMEN AND PELVIS WITH CONTRAST TECHNIQUE: Multidetector CT imaging of the abdomen and pelvis was performed using the standard protocol following bolus administration of intravenous contrast. CONTRAST:  145mL ISOVUE-300 IOPAMIDOL (ISOVUE-300) INJECTION 61% COMPARISON:  CT abdomen pelvis 01/22/2018; drainage CT 01/26/2018 FINDINGS: Lower chest: Heart is enlarged. Moderate right and small left pleural effusions with underlying pulmonary consolidation. Hepatobiliary: The liver is lobular in contour. Similar-appearing subcentimeter low-attenuation lesion right hepatic lobe (image 20; series 3). Multiple gallstones. No intrahepatic or extrahepatic biliary ductal dilatation. Pancreas: Unremarkable Spleen: Unremarkable Adrenals/Urinary Tract: Adrenal glands are normal. Kidneys enhance symmetrically with contrast. No hydronephrosis. Bladder is drained with a Foley catheter. Stomach/Bowel: Mild duodenal and proximal small bowel wall thickening. There is small bowel dilatation most notable within the mid to distal small bowel measuring up to 4.8 cm (image 73; series 3). There is transition to decompressed small bowel within the central lower abdomen. Small amount of free fluid within the upper  abdomen surrounding the liver and spleen. There is wall thickening of the duodenum and proximal small bowel. No definite free intraperitoneal air. Vascular/Lymphatic: Peripheral calcified atherosclerotic plaque. No retroperitoneal lymphadenopathy. Multiple collateral vessels within the left upper hemiabdomen compatible with portal venous hypertension. Reproductive: Status post hysterectomy. Other: Anasarca. Right lower quadrant pigtail drainage catheter is demonstrated. This is located centrally and has drained one of the previously described right lower quadrant fluid collections. There is an additional complex cystic collection within the right pelvic sidewall which measures 3.9 x 3.6 cm (image 64; series 3). There is a loculated fluid collection within the pelvis which measures 3.9 x 7.2 cm (image 73; series 3). Musculoskeletal: Lumbar spine degenerative changes. There is expansion of the left upper inner muscle (image 84; series 3). IMPRESSION: 1. The small bowel is markedly dilated with transition to decompressed small bowel within the pelvis concerning for small bowel obstruction. Additionally there is wall thickening of the duodenum and proximal small bowel concerning for enteritis which may be reactive, infectious, inflammatory or ischemic in etiology. 2. Interval insertion right lower quadrant pigtail drainage catheter with drainage of one of the right lower quadrant fluid collections. The adjacent right pelvic sidewall complex fluid collection persists and may represent an abscess or potentially complex ovarian lesion. Consider further evaluation of this lesion with pelvic ultrasound. Additionally there is a loculated fluid collection within the pelvis. 3. Interval expansion of the left obturator muscle. This may be secondary to intramuscular hematoma or potentially intramuscular infection. 4. Large right and moderate left pleural effusions with underlying pulmonary consolidation which may represent  atelectasis or infection. 5. Cholelithiasis. 6. Anasarca. 7. Cirrhosis and portal venous hypertension. 8. These results will be called to the ordering clinician or representative by the Radiologist Assistant, and communication documented in the PACS or zVision Dashboard. Electronically Signed   By: Lovey Newcomer M.D.   On: 01/30/2018 18:32   Dg Abd Portable 1v  Result Date: 01/21/2018 CLINICAL DATA:  Bowel obstruction EXAM: PORTABLE ABDOMEN - 1 VIEW COMPARISON:  January 31, 2018 abdominal radiograph and abdominal CT January 30, 2018 FINDINGS: Nasogastric tube tip and side port are in the stomach. There remain loops of dilated small and large bowel. There is contrast in the ascending colon. There is a drain in the right pelvic region.  There is a loculated pleural effusion in the lateral right base. IMPRESSION: Nasogastric tube tip and side port in stomach. The bowel gas pattern is more suggestive of ileus than bowel obstruction, although a degree of bowel obstruction is certainly possible. No free air. Loculated pleural effusion right base. Electronically Signed   By: Lowella Grip III M.D.   On: 01/23/2018 09:58   Dg Abd Portable 1v  Result Date: 01/31/2018 CLINICAL DATA:  Status post NG tube placement. EXAM: PORTABLE ABDOMEN - 1 VIEW COMPARISON:  01/31/2018 FINDINGS: The nasogastric tube tip and side port are well below the level of the GE junction. The tip is in the projection of the distal body of stomach gaseous distension of the large and small bowel loops are again noted compatible with ileus versus partial obstruction. The degree of bowel distention is not significantly changed from comparison exam IMPRESSION: 1. NG tube tip projects over the expected location of the distal body of stomach. 2. No change in small bowel obstruction pattern. Electronically Signed   By: Kerby Moors M.D.   On: 01/31/2018 16:56   Dg Abd Portable 1v  Result Date: 01/31/2018 CLINICAL DATA:  84 year old female with  evidence of small bowel obstruction in the pelvis on CT yesterday. Fluid collections, percutaneous drainage catheter placed. EXAM: PORTABLE ABDOMEN - 1 VIEW COMPARISON:  CT Abdomen and Pelvis 01/30/2018 and earlier. FINDINGS: Portable AP supine views at 0953 hours. Right lower quadrant pigtail drain remains in place. There is gas throughout small and large bowel loops. Small bowel loops in the mid to lower abdomen remain dilated up to 48 millimeters diameter. Oral contrast yesterday now appears to be primarily within the proximal colon. Superimposed evidence of ascites, and the right pleural effusion demonstrated yesterday. No definite pneumoperitoneum on these supine views. No acute osseous abnormality identified. IMPRESSION: 1. Stable bowel gas pattern since yesterday compatible with a high-grade partial small bowel obstruction, but there may be superimposed small and large bowel ileus as well. 2. Evidence of ascites and pleural effusion redemonstrated. 3. Stable right lower quadrant percutaneous drain. Electronically Signed   By: Genevie Ann M.D.   On: 01/31/2018 10:22        Scheduled Meds: . [MAR Hold] digoxin  0.0625 mg Oral Daily  . [MAR Hold] docusate sodium  100 mg Oral BID  . [MAR Hold] feeding supplement  1 Container Oral BID BM  . [MAR Hold] furosemide  80 mg Intravenous Q12H  . [MAR Hold] insulin aspart  0-15 Units Subcutaneous Q6H  . [MAR Hold] lactulose  30 g Oral BID  . [MAR Hold] mouth rinse  15 mL Mouth Rinse BID  . [MAR Hold] metoprolol tartrate  2.5 mg Intravenous Q8H  . [MAR Hold] rifaximin  550 mg Oral BID  . [MAR Hold] sodium chloride flush  10-40 mL Intracatheter Q12H  . [MAR Hold] sodium chloride flush  5 mL Intracatheter Q8H  . [MAR Hold] traZODone  100 mg Oral QHS   Continuous Infusions: . [MAR Hold] sodium chloride    . [MAR Hold] sodium chloride    . [MAR Hold] piperacillin-tazobactam (ZOSYN)  IV Stopped (01/18/2018 1004)  . TPN ADULT (ION) 65 mL/hr at 01/31/18 1738   . TPN ADULT (ION)       LOS: 13 days    Time spent: 35 mins.More than 50% of that time was spent in counseling and/or coordination of care.      Shelly Coss, MD [facility] Pager [phone]  If 7PM-7AM, please  contact night-coverage [email or web address] Password [facility] 01/13/2018, 12:59 PM

## 2018-02-01 NOTE — Progress Notes (Signed)
Dr Therisa Doyne updated on SBP (98-101)  & HR (60-70). Okay to increase levophed to 30mcg/min & decrease diltiazem to 5mg /hr per orders

## 2018-02-01 NOTE — Progress Notes (Deleted)
Pt's daughter Wells Guiles updated on pt's room [phone]

## 2018-02-01 NOTE — Progress Notes (Signed)
PHARMACY - ADULT TOTAL PARENTERAL NUTRITION CONSULT NOTE   Pharmacy Consult:  TPN Indication:  Prolonged ileus  Patient Measurements: Height: 5' 5.5" (166.4 cm) Weight: 162 lb (73.5 kg) IBW/kg (Calculated) : 58.15 TPN AdjBW (KG): 64 Body mass index is 26.55 kg/m.  Assessment:  52 YOF presented on 01/29/2018 with N/V/D and abdominal pain, found to have pSBO.  She was placed on a clear liquid diet on 01/21/18.  01/22/18 CT concerning for perforated appendicitis and patient may need surgery; therefore, she was made NPO again.  Pharmacy consulted to manage TPN for ileus and prolonged NPO status.  GI: cirrhosis with portal venous HTN. Prealbumin remains <5, LBM 4/27, minimal abscess drain outpt, NG O/P 177mL.  Still having abd pain and nausea, 4/28 CT shows probable pSBO, may need ex-lap with ileocecal resection and ileostomy.  Docusate, lactulose/rifaximin, PRN Zofran Endo: no hx DM - CBGs acceptable Insulin requirements in the past 24 hours: 12 units SSI Lytes: all WNL Renal: SCr 1.01, BUN 44 - UOP 1.8 ml/kg/hr Pulm: remains on 2L [place] Cards: Rheumatic heart dz - BP controlled, HR 59-119.  Digoxin, Lasix IV increased 4/29, IV metoprolol AC: Coumadin PTA for Afib / mech valves >> Heparin, s/p Vit K - hgb low, plts WNL, INR 1.74 Hepatobil: LFTs / TG WNL, tbili mildly elevated at 1.8 Neuro: trazodone, PRN morphine, pain score 5-10 ID: Vanc/Zosyn for appendiceal abscess/enteritis - afebrile, WBC up to 36.6 TPN Access: PICC placed 01/23/18 TPN start date: 01/23/18  Nutritional Goals (per RD 4/25): 1365-1575 kCal, 81-93 g protein per day, 1.5-1.6 L fluid/day   Current Nutrition:  TPN   Plan:  Continue TPN at 65 ml/hr, providing 84g AA, 250g CHO, and 38g ILE for a total of 1,560 kCal, meeting 100% of patient needs Electrolytes in TPN: Na incr 4/29, Phos decr 4/29, increase K, no change Mg/Ca, Cl:Ac 2:1 Daily multivitamin and trace elements in TPN Continue moderate SSI Q4H.  Watch CBGs and may  need to increase SSI vs add insulin to TPN. F/U surgical plans   Iyan Flett D. Mina Marble, PharmD, BCPS, BCCCP Pager:  [phone] 01/31/2018, 8:21 AM

## 2018-02-01 NOTE — Progress Notes (Signed)
eLink Physician-Brief Progress Note Patient Name: [name] DOB: Dec 26, 1932 MRN: [id number]   Date of Service  01/11/2018  HPI/Events of Note  Request for review of orders. Fentanyl IV infusion written as a continuous IV infusion.   eICU Interventions  Will order: 1. Fentanyl IV infusion. Titrate to RASS = 0 to -1.  2. Portable CXR STAT.      Intervention Category Major Interventions: Hypotension - evaluation and management;Other:  Lysle Dingwall 01/26/2018, 9:28 PM

## 2018-02-01 NOTE — Progress Notes (Signed)
Pt's daughter Lorriane Shire, updated on pt's room [id number] - contact [phone]

## 2018-02-01 NOTE — Progress Notes (Signed)
Pt out of the room at this time secondary to medical procedure/laprotomy.  Will check back tomorrow for OT treatment.     Clyda Greener, OTR/L 01/03/2018

## 2018-02-01 NOTE — Consult Note (Addendum)
PULMONARY / CRITICAL CARE MEDICINE   Name: [name] Chung MRN: [id number] DOB: 05-18-1933    ADMISSION DATE:  01/12/2018 CONSULTATION DATE: 01/14/2018   REFERRING MD: Georgette Dover   CHIEF COMPLAINT: abdominal pain  HISTORY OF PRESENT ILLNESS:   84 yr old lady with multiple comorbidities including Afib, mechanical atrial and mitral valves on coumadin, CHF who was admitted on 4/17 with abd pain, nausea and vomiting and was found to have small bowel obstruction. She was also treated for UTI. CT abdomen repeated and was showing perforated apenics and abscess. A drain was inserted via IR and conservative managemnet was tried. Repeated CT scan was showing no resolution of the abscess and her WBC continued to go up in addition to her abdominal pain so she was taken to the OR today for adhesion lysis,  small bowel resection, appendectomy and abscess drainage. Patient required 3 pressors in the OR and is intubated. I was called to help in taking care of the patient.  I came to see the patient in the PACU she was sedated with precedex, on vasopressin and cardizem drip. She is intubated and obviously not able to give any history.   PAST MEDICAL HISTORY :  She  has a past medical history of Aortic insufficiency, Arthritis, Chronic anticoagulation, Chronic atrial fibrillation ([place]), Edema, Rheumatic heart disease, and Valvular heart disease.  PAST SURGICAL HISTORY: She  has a past surgical history that includes Aortic valve replacement; Mitral valve replacement; Back surgery; Abdominal hysterectomy; and Cataract extraction w/ intraocular lens  implant, bilateral.  Allergies  Allergen Reactions  . Asa Buff (Mag [Buffered Aspirin] Other (See Comments)    On coumadin  . Codeine   . Mirtazapine Nausea Only  . Ultram [Tramadol Hcl]     No current facility-administered medications on file prior to encounter.    Current Outpatient Medications on File Prior to Encounter  Medication Sig  . digoxin (LANOXIN) 0.125  MG tablet TAKE ONE-HALF TABLET BY  MOUTH DAILY  . docusate sodium (COLACE) 100 MG capsule Take 100 mg by mouth 2 (two) times daily.  . ferrous sulfate 325 (65 FE) MG tablet Take 325 mg by mouth daily with breakfast.  . furosemide (LASIX) 20 MG tablet TAKE 2 TABLETS BY MOUTH TWO TIMES DAILY  . loperamide (IMODIUM) 2 MG capsule Take 2 mg by mouth as needed for diarrhea or loose stools.  . metolazone (ZAROXOLYN) 2.5 MG tablet TAKE 1 TABLET BY MOUTH  TWICE WEEKLY  . Multiple Vitamin (MULTIVITAMIN) capsule Take 1 capsule by mouth daily.  . potassium chloride (K-DUR) 10 MEQ tablet Take 6 tablets ( 60 meq ) three times a day  . rifaximin (XIFAXAN) 550 MG TABS tablet Take 550 mg by mouth 2 (two) times daily.  . traZODone (DESYREL) 100 MG tablet Take 100 mg by mouth at bedtime.   Marland Kitchen warfarin (COUMADIN) 5 MG tablet TAKE 1 TO 2 TABLETS BY  MOUTH EVERY DAY AS DIRECTED BY COUMADIN CLINIC (Patient taking differently: Take 5 mg by mouth one time only at 6 PM. 5 mg on Sunday and Thursday All other days 2.5 mg)  . spironolactone (ALDACTONE) 25 MG tablet Take 25 mg by mouth daily.     Current Facility-Administered Medications:  .  [MAR Hold] 0.9 %  sodium chloride infusion, , Intravenous, Once, [place], [employer], PA-C .  [MAR Hold] 0.9 %  sodium chloride infusion, , Intravenous, Once, [place], [employer], PA-C .  [MAR Hold] acetaminophen (TYLENOL) tablet 650 mg, 650 mg, Oral,  Q6H PRN, 650 mg at 01/31/18 0909 **OR** [MAR Hold] acetaminophen (TYLENOL) suppository 650 mg, 650 mg, Rectal, Q6H PRN, Ivor Costa, MD .  [facility] Hold] digoxin (LANOXIN) tablet 0.0625 mg, 0.0625 mg, Oral, Daily, Hongalgi, Anand D, MD, 0.0625 mg at 01/31/18 0910 .  diltiazem (CARDIZEM) 100 mg in dextrose 5 % 100 mL (1 mg/mL) infusion, 5-15 mg/hr, Intravenous, Titrated, Adhikari, Amrit, MD, Last Rate: 10 mL/hr at 01/17/2018 1426, 10 mg/hr at 01/23/2018 1426 .  [MAR Hold] docusate sodium (COLACE) capsule 100 mg, 100 mg, Oral, BID, Annita Brod, MD,  100 mg at 01/31/18 0909 .  fentaNYL (SUBLIMAZE) injection 25-50 mcg, 25-50 mcg, Intravenous, Q5 min PRN, Roberts Gaudy, MD .  [facility] Hold] furosemide (LASIX) injection 80 mg, 80 mg, Intravenous, Q12H, Sherren Mocha, MD, 80 mg at 01/12/2018 1008 .  [MAR Hold] hydrALAZINE (APRESOLINE) injection 5 mg, 5 mg, Intravenous, Q2H PRN, Ivor Costa, MD .  [facility] Hold] insulin aspart (novoLOG) injection 0-15 Units, 0-15 Units, Subcutaneous, Q6H, Reginia Naas, RPH, 3 Units at 01/26/2018 [phone] .  [MAR Hold] lactulose (CHRONULAC) 10 GM/15ML solution 30 g, 30 g, Oral, BID, Annita Brod, MD, 30 g at 01/31/18 0908 .  [facility] Hold] MEDLINE mouth rinse, 15 mL, Mouth Rinse, BID, Hongalgi, Anand D, MD, 15 mL at 01/06/2018 1010 .  [MAR Hold] metoprolol tartrate (LOPRESSOR) injection 2.5 mg, 2.5 mg, Intravenous, Q8H, Ivor Costa, MD, 2.5 mg at 01/07/2018 0600 .  [MAR Hold] morphine 2 MG/ML injection 1 mg, 1 mg, Intravenous, Q2H PRN, Hongalgi, Anand D, MD, 1 mg at 01/26/2018 0050 .  norepinephrine (LEVOPHED) 4 mg in dextrose 5 % 250 mL (0.016 mg/mL) infusion, 0-40 mcg/min, Intravenous, Titrated, Adhikari, Amrit, MD, Last Rate: 7.5 mL/hr at 01/21/2018 1630, 2 mcg/min at 01/04/2018 1630 .  [MAR Hold] ondansetron (ZOFRAN) injection 4 mg, 4 mg, Intravenous, Q8H PRN, Ivor Costa, MD, 4 mg at 01/29/18 0934 .  ondansetron (ZOFRAN) injection 4 mg, 4 mg, Intravenous, Once PRN, Roberts Gaudy, MD .  oxyCODONE (Oxy IR/ROXICODONE) immediate release tablet 5 mg, 5 mg, Oral, Once PRN **OR** oxyCODONE (ROXICODONE) 5 MG/5ML solution 5 mg, 5 mg, Oral, Once PRN, Roberts Gaudy, MD .  [facility] Hold] piperacillin-tazobactam (ZOSYN) IVPB 3.375 g, 3.375 g, Intravenous, Q8H, Joselyn Glassman A, RPH, Stopped at 01/16/2018 1004 .  [MAR Hold] RESOURCE THICKENUP CLEAR, , Oral, PRN, Annita Brod, MD .  [facility] Hold] rifaximin Doreene Nest) tablet 550 mg, 550 mg, Oral, BID, Ivor Costa, MD, 550 mg at 01/31/18 0908 .  [MAR Hold] sodium chloride flush (NS) 0.9 % injection  10-40 mL, 10-40 mL, Intracatheter, Q12H, Hongalgi, Anand D, MD, 10 mL at 01/17/2018 1009 .  [MAR Hold] sodium chloride flush (NS) 0.9 % injection 10-40 mL, 10-40 mL, Intracatheter, PRN, Modena Jansky, MD, 20 mL at 01/23/2018 0512 .  [MAR Hold] sodium chloride flush (NS) 0.9 % injection 5 mL, 5 mL, Intracatheter, Q8H, Hoss, Arthur, MD, 5 mL at 01/05/2018 0607 .  TPN ADULT (ION), , Intravenous, Continuous TPN, Tyrone Apple, RPH, Last Rate: 65 mL/hr at 01/31/18 1738 .  TPN ADULT (ION), , Intravenous, Continuous TPN, Tyrone Apple, RPH .  [MAR Hold] traZODone (DESYREL) tablet 100 mg, 100 mg, Oral, QHS, Ivor Costa, MD, 100 mg at 01/30/18 2126 .  vasopressin (PITRESSIN) 40 Units in sodium chloride 0.9 % 250 mL (0.16 Units/mL) infusion, 0.03 Units/min, Intravenous, Continuous, Adhikari, Amrit, MD, Last Rate: 11.3 mL/hr at 01/29/2018 1536, 0.03 Units/min at 01/25/2018 1536 .  [facility] Hold]  zolpidem (AMBIEN) tablet 5 mg, 5 mg, Oral, QHS PRN, Ivor Costa, MD, 5 mg at 01/21/18 2147  FAMILY HISTORY:  Her indicated that her mother is deceased. She indicated that her father is deceased. She indicated that all of her three sisters are deceased. She indicated that her brother is deceased. She indicated that her maternal grandmother is deceased. She indicated that her maternal grandfather is deceased. She indicated that her paternal grandmother is deceased. She indicated that her paternal grandfather is deceased.   SOCIAL HISTORY: She  reports that she has never smoked. She has never used smokeless tobacco. She reports that she does not drink alcohol or use drugs.  REVIEW OF SYSTEMS:   Could not be obtained due to sedation     VITAL SIGNS: BP (!) 138/49   Pulse (!) 101   Temp (!) 97.3 F (36.3 C)   Resp 14   Ht 5' 5.5" (1.664 m)   Wt 73.5 kg (162 lb)   SpO2 98%   BMI 26.55 kg/m   HEMODYNAMICS:    VENTILATOR SETTINGS: Vent Mode: PRVC FiO2 (%):  [75 %] 75 % Set Rate:  [14 bmp] 14 bmp Vt Set:  [470 mL] 470  mL PEEP:  [5 cmH20] 5 cmH20 Plateau Pressure:  [22 cmH20] 22 cmH20  INTAKE / OUTPUT: I/O last 3 completed shifts: In: 1476.9 [I.V.:661.9; Blood:315; IV Piggyback:500] Out: 1610 [Urine:3550; Emesis/NG output:150; Drains:10]  PHYSICAL EXAMINATION: General:  Sedated acutely ill  Neuro:  Sedated  HEENT:  Intubated  Cardiovascular: irregularly irregular  Lungs: clear no crackles no wheezing  Abdomen:  Soft no tenderness  Musculoskeletal: no edema  Skin:  No rash   LABS:  BMET Recent Labs  Lab 01/30/18 0512 01/31/18 0417 01/08/2018 0500 01/18/2018 1312 01/06/2018 1428  NA 135 135 140 145 146*  K 4.5 5.0 3.6 3.4* 3.7  CL 99* 101 102  --   --   CO2 31 30 27   --   --   BUN 36* 43* 44*  --   --   CREATININE 0.97 1.07* 1.01*  --   --   GLUCOSE 147* 191* 175*  --   --     Electrolytes Recent Labs  Lab 01/26/18 0419 01/27/18 0234  01/30/18 0512 01/31/18 0417 01/03/2018 0500  CALCIUM 7.7* 7.9*   < > 8.0* 8.1* 8.3*  MG  --  1.9  --   --  2.2  --   PHOS 2.2* 3.0  --   --  4.0  --    < > = values in this interval not displayed.    CBC Recent Labs  Lab 01/30/18 0512 01/31/18 0417 01/25/2018 0500 01/18/2018 1312 01/31/2018 1428  WBC 31.3* 36.1* 36.6*  --   --   HGB 7.3* 7.9* 8.0* 7.1* 7.8*  HCT 21.6* 24.9* 24.9* 21.0* 23.0*  PLT 174 173 184  --   --     Coag's Recent Labs  Lab 01/30/18 0512 01/31/18 0417 01/09/2018 0500  INR 1.71 1.62 1.74    Sepsis Markers Recent Labs  Lab 01/28/18 1721 01/29/18 0438 01/30/18 0512  PROCALCITON 0.60 0.54 0.62    ABG Recent Labs  Lab 01/10/2018 1312 01/08/2018 1428  PHART 7.444 7.392  PCO2ART 42.6 43.1  PO2ART 122.0* 153.0*    Liver Enzymes Recent Labs  Lab 01/26/18 0419 01/27/18 0234 01/31/18 0417  AST  --  30 42*  ALT  --  12* 12*  ALKPHOS  --  70 69  BILITOT  --  1.2 1.8*  ALBUMIN 1.6* 1.9* 1.8*    Cardiac Enzymes No results for input(s): TROPONINI, PROBNP in the last 168 hours.  Glucose Recent Labs  Lab  01/31/18 0005 01/31/18 0609 01/31/18 1247 01/31/18 1703 01/31/18 2348 01/26/2018 0630  GLUCAP 167* 189* 178* 168* 150* 171*    Imaging Dg Chest 1 View  Result Date: 01/11/2018 CLINICAL DATA:  84 year old female with evidence of small bowel obstruction in the pelvis on CT. Abnormal abdominal and pelvic fluid collections, percutaneous drainage catheter placed. NG tube may have been pulled back. EXAM: CHEST  1 VIEW COMPARISON:  KUB 01/31/2018 and earlier. FINDINGS: Portable AP semi upright view at 0815 hours. Enteric tube courses to the abdomen as before, tip not included. Veiling opacity persists at the lung bases greater on the right. New coarse and confluent left mid and upper lung opacity since 01/28/2018. No definite new opacity in the right lung. No pneumothorax identified. Stable cardiomegaly and mediastinal contours. Prior valve replacement. IMPRESSION: 1. New confluent left upper lung opacity suspicious for a New Left Lung Pneumonia since 01/28/2018. 2. Continued right greater than left lower lung veiling opacities seen to reflect pleural effusions and atelectasis on the most recent CT Abdomen and Pelvis. Electronically Signed   By: Genevie Ann M.D.   On: 01/29/2018 10:03   Dg Abd Portable 1v  Result Date: 01/28/2018 CLINICAL DATA:  Bowel obstruction EXAM: PORTABLE ABDOMEN - 1 VIEW COMPARISON:  January 31, 2018 abdominal radiograph and abdominal CT January 30, 2018 FINDINGS: Nasogastric tube tip and side port are in the stomach. There remain loops of dilated small and large bowel. There is contrast in the ascending colon. There is a drain in the right pelvic region. There is a loculated pleural effusion in the lateral right base. IMPRESSION: Nasogastric tube tip and side port in stomach. The bowel gas pattern is more suggestive of ileus than bowel obstruction, although a degree of bowel obstruction is certainly possible. No free air. Loculated pleural effusion right base. Electronically Signed   By:  Lowella Grip III M.D.   On: 01/16/2018 09:58      CULTURES: 4/24 >> bacteroids fragilis   ANTIBIOTICS: Zosyn  SIGNIFICANT EVENTS: 4/30  Exploratory laparotomy, lysis of adhesions, small bowel resection, appendectomy, right salpingectomy, drainage of abdominal abscess    LINES/TUBES: Right PICC Left CVC IJ   DISCUSSION: 84 yr old lady with multiple comorbidities including Afib, mechanical atrial and mitral valves on coumadin, CHF with perforated appendix and abdominal abscess collection s/p small bowel resection, appendectomy  and abdominal drainage 01/08/2018    ASSESSMENT / PLAN:  PULMONARY A: Acute hypoxemic respiratory failure requiring mechanical ventilation  Acute pulm edema  Bilateral pleural effusion   P:   Wean down FIO2 as tolerated. She is on 75% FIO2 - diurese when on less pressors - control afib rate   CARDIOVASCULAR A:  Septic shock  afib with RVR Acute diastolic CHF Acute pulm edema Mechanical Aortic and mitral valve   P:  - resume anticoagulation ASAP when ok with GSx - cardizem drip control rate  - titrate pressors down  - diurese when less pressors  - appreciate cards follow up   RENAL A:   No issues Hyperkalemia corrected   P:   Follow urine output and renal function   GASTROINTESTINAL A:   Abdominal abscess Perforated appendics s/p appendectomy, abscess drainage and small bowel resection Liver cirrhosis    P:   NPO - follow GSx recs   HEMATOLOGIC A:  Anemia   P:  - trend CBC - off anticoagulation, to be resumed ASAP when ok with GSx  INFECTIOUS A:   Abdominal abscess s/p drainage  P:   Continue zosyn  ENDOCRINE A:   Hyperglycemia  P:   SSI  NEUROLOGIC A:   Acute metabolic encephalopathy P:   RASS goal: -2 Start fentanyl in addition to precedex   FAMILY  - Updates:  Non eavailable   - Inter-disciplinary family meet or Palliative Care meeting due by:  On board   I have spent 45 mins of CC time  bedside or in the unit exclusive of billable procedures    Pulmonary and [place] Pager: [phone]  01/29/2018, 4:19 PM

## 2018-02-01 NOTE — Progress Notes (Signed)
1630 -updated Dr Johnette Abraham Ftitzgerald regarding radiology report about central line placement ( no changes), levophed restarted for SBP of 98, & orders obtained for Precedex in PACU- pharmacy called for cardiazem & precedex gtts at [place]

## 2018-02-01 NOTE — Progress Notes (Signed)
Physical Therapy Cancellation Note    01/14/2018 1312  PT Visit Information  Last PT Received On 01/14/2018  Reason Eval/Treat Not Completed Patient at procedure or test/unavailable (Pt to OR for laparotomy. PT will continue to follow acutely.)   Earney Navy, PTA Pager: [phone]

## 2018-02-01 NOTE — Anesthesia Procedure Notes (Signed)
Central Venous Catheter Insertion Performed by: Roberts Gaudy, MD, anesthesiologist Start/End04/19/2019 12:30 PM, 01/18/2018 12:40 PM Patient location: OR. Preanesthetic checklist: patient identified, IV checked, site marked, risks and benefits discussed, surgical consent, monitors and equipment checked, pre-op evaluation, timeout performed and anesthesia consent Lidocaine 1% used for infiltration and patient sedated Hand hygiene performed  and maximum sterile barriers used  Catheter size: 12 Fr Total catheter length 16. Central line was placed.Triple lumen Procedure performed using ultrasound guided technique. Ultrasound Notes:image(s) printed for medical record Attempts: 1 Following insertion, dressing applied, line sutured and Biopatch. Post procedure assessment: blood return through all ports, free fluid flow and no air  Patient tolerated the procedure well with no immediate complications.

## 2018-02-01 NOTE — Progress Notes (Signed)
NG tube appears displaced a small amount from previous day. But unsure how far to advance tube, d/t no markings at end of placement. MD made aware.

## 2018-02-01 NOTE — Progress Notes (Signed)
Patient transported from PACU to 6N62 without complications via mechanical ventilation.

## 2018-02-01 NOTE — Op Note (Signed)
Preop diagnosis: Small bowel obstruction, probable perforated appendicitis, hepatic cirrhosis Postop diagnosis: Small bowel obstruction, perforated appendicitis, pyosalpinx, hepatic cirrhosis Procedure performed: Exploratory laparotomy, lysis of adhesions, small bowel resection, appendectomy, right salpingectomy, drainage of abdominal abscess Surgeon:Kdyn Vonbehren K Debroh Sieloff Anesthesia: General endotracheal Indications:  84 year old with perforated appendicitis and small bowel obstruction secondary to the inflammatory mass in the RLQ  The patient has a history of CHF and cirrhosis with portal hypertension.  Attempts were made to manage this conservatively, but she has become completely obstructed with an increased WBC and worsening overall condition.  We discussed the risks of the procedure with the patient and her family and made the decision to proceed with exploration.  Description of procedure: The patient is brought to the operating room and placed in a supine position on the operating room table.  After an adequate level general anesthesia was obtained, a Foley catheter was placed under sterile technique.  A femoral arterial line was placed by anesthesia.  They were unable to place a radial arterial line because of swelling in her upper extremities.  A central line was also placed by anesthesia and will be dictated separately.  The patient had been transfused FFP and had blood available.  There is a timeout was taken to ensure the proper patient proper procedure.  We made a vertical midline incision.  Dissection was carried down to the fascia.  Went to the linea alba sharply.  Patient has some free fluid in the abdomen but no gross purulence.  We open the fascia widely.  The small bowel was quite dilated.  We placed the Bookwalter retractor for exposure.  We began eviscerating the small bowel out of the abdomen.  Approximately the small bowel at the ligament of Treitz is relatively normal in appearance.   However the small bowel became more dilated progressively as we move distally.  The mid jejunum was densely adherent down into the pelvis and the right lower quadrant.  All the surfaces seem to be oozing.  Anesthesia administered more plasma as well as platelets as well as packed red blood cells.  The seem to improve the oozing.  With a significant amount of blunt dissection we were able to lyse all adhesions and mobilized the small bowel out of the pelvis.  An abscess was encountered in the retroperitoneum behind the cecum.  The cecum itself appeared to be soft viable and intact.  There is another purulent fluid collection in the pelvis just adjacent to the first abscess.  We irrigated the pelvis thoroughly.  No other purulent abscess cavities were noted.  We packed sponges into the pelvis to aid with hemostasis.  We then examined the entire colon which appeared to be mildly dilated but otherwise normal.  We ran the small bowel again from proximal to distal.  We mobilized the cecum medially.  We were able to identify the base of the appendix.  There is a portion of the appendix that is visible.  This appeared relatively normal.  We dissected back to the base of the appendix and divided at the base of the appendix with a TA 30 stapler.  I could not locate the distal portion of the appendix and the abscess cavity.  We then reexamined the distal small bowel.  There is 30 cm segment of small bowel that has several ischemic patches and significant inflammatory adhesions.  We made the decision to resect this portion.  This is about 15 cm proximal to the ileocecal valve.  We divided  the small bowel proximally distally with a GIA-75 stapler.  The mesentery was divided with the LigaSure device.  We created a side-to-side anastomosis with a GIA-75 stapler and a TA 60 stapler.  A reinforcing suture of 3-0 silk was placed at the crotch of the anastomosis.  The mesenteric defect was closed with 2-0 silk.  The anastomosis was  widely patent.  We irrigated the pelvis thoroughly with warm saline and inspected for further purulence.  I palpated the right fallopian tube.  There is a considerable amount of purulent fluid that has been expressed from the fallopian tube.  We made a decision to resect the pyosalpinx.  I used the LigaSure device to divide the mesentery of the right fallopian tube.  We divided the tube proximal to the area of abscess with a TA-30 stapler.  We again irrigated.  There still was a small amount of residual oozing in the pelvis.  We sprayed Arista powder into the pelvis which helped to control the oozing.  A 19 French drain was brought in through an incision in the right upper quadrant and placed in the abscess cavity in the right lower quadrant in the pelvis.  This was secured with 2-0 silk.  We then closed the fascia with multiple interrupted #1 Novafil sutures.  We placed a wound VAC in the subcutaneous tissue.  The patient was then transported to the intensive care unit still intubated.  All sponge, instrument, needle counts are correct.  Imogene Burn. Georgette Dover, MD, [facility] Surgery  General/ Trauma Surgery  01/29/2018 3:09 PM

## 2018-02-01 NOTE — Progress Notes (Signed)
[facility] Progress Note Patient Name: [name] Chung DOB: 11-29-1932 MRN: [id number]   Date of Service  01/18/2018  HPI/Events of Note  Returns from OR on Norepinephrine and Vasopressin IV infusions. Request for orders for same.   eICU Interventions  Will order: 1. Norepinephrine IV infusion. Titrate to MAP >= 65. 2. Vasopressin IV infusion. Shock dose.      Intervention Category Major Interventions: Hypotension - evaluation and management  Jaquon Gingerich [place] 01/07/2018, 9:04 PM

## 2018-02-01 NOTE — Progress Notes (Addendum)
Patient woke up yelling "I want to go home"  " I want Jasmine" "You put me under a spell and this place isn't real."  Patient removed her oxygen and tried to remove the NG tube. Attempted to oriented and educated the patient but she refused.  Last set of vitals : BP 138/60; resp 19; O2 sats 90 %, pulse 119.  This nurse was able to calm the patient and keep her from pulling at her lines.   Will continue to monitor the patient and notify as needed

## 2018-02-01 NOTE — Transfer of Care (Signed)
Immediate Anesthesia Transfer of Care Note  Patient: [name] Chung  Procedure(s) Performed: EXPLORATORY LAPOROTOMY (N/A Abdomen) APPENDECTOMY (N/A Abdomen) SMALL BOWEL RESECTION (N/A Abdomen) RIGHT SALPINGECTOMY (Right Abdomen)  Patient Location: PACU  Anesthesia Type:General  Level of Consciousness: Patient remains intubated per anesthesia plan  Airway & Oxygen Therapy: Patient remains intubated per anesthesia plan and Patient placed on Ventilator (see vital sign flow sheet for setting)  Post-op Assessment: Report given to RN and Post -op Vital signs reviewed and stable  Post vital signs: Reviewed and stable  Last Vitals:  Vitals Value Taken Time  BP 148/56 01/09/2018  3:18 PM  Temp    Pulse 110 01/03/2018  3:26 PM  Resp 14 01/04/2018  3:26 PM  SpO2 100 % 01/20/2018  3:26 PM  Vitals shown include unvalidated device data.  Last Pain:  Vitals:   01/21/2018 1121  TempSrc: Oral  PainSc:       Patients Stated Pain Goal: 4 (64/15/83 0940)  Complications: No apparent anesthesia complications

## 2018-02-01 NOTE — Progress Notes (Signed)
[facility] Progress Note     Subjective: CC: abdominal pain and distention Minimal output from NGT. Abdomen still very distended. Pain is unchanged. Patient altered overnight, more appropriate this AM. Reports she is passing some flatus. Discussed that patient is not improving with conservative management and recommend surgical intervention. Discussed risks with patient and step-daughter.  Objective: Vital signs in last 24 hours: Pulse Rate:  [28-121] 105 (04/30 0514) Resp:  [24-29] 29 (04/30 0514) BP: (127-146)/(60-93) 138/60 (04/30 0514) SpO2:  [66 %-95 %] 89 % (04/30 0514) Weight:  [73.5 kg (162 lb)] 73.5 kg (162 lb) (04/30 0500) Last BM Date: 01/29/18  Intake/Output from previous day: 04/29 0701 - 04/30 0700 In: 1161.9 [I.V.:661.9; IV Piggyback:500] Out: 7371 [Urine:3200; Emesis/NG output:150; Drains:10] Intake/Output this shift: No intake/output data recorded.  PE: Gen:  in obvious discomfort Card:  Tahcycardia, irregular rhythm Pulm:  tachypneic , clear to auscultation bilaterally Abd: diffusely tender, very distended, few tinkling bowel sounds, drain in RLQ with minimal bloody drainage in bulb Skin: warm and dry, no rashes   Lab Results:  Recent Labs    01/31/18 0417 01/03/2018 0500  WBC 36.1* 36.6*  HGB 7.9* 8.0*  HCT 24.9* 24.9*  PLT 173 184   BMET Recent Labs    01/31/18 0417 01/07/2018 0500  NA 135 140  K 5.0 3.6  CL 101 102  CO2 30 27  GLUCOSE 191* 175*  BUN 43* 44*  CREATININE 1.07* 1.01*  CALCIUM 8.1* 8.3*   PT/INR Recent Labs    01/31/18 0417 01/22/2018 0500  LABPROT 19.1* 20.2*  INR 1.62 1.74   CMP     Component Value Date/Time   NA 140 01/15/2018 0500   NA 136 01/04/2018 1416   K 3.6 01/10/2018 0500   CL 102 01/08/2018 0500   CO2 27 01/13/2018 0500   GLUCOSE 175 (H) 01/15/2018 0500   BUN 44 (H) 01/13/2018 0500   BUN 16 01/04/2018 1416   CREATININE 1.01 (H) 01/15/2018 0500   CREATININE 0.67 03/13/2015 1419   CALCIUM  8.3 (L) 01/31/2018 0500   PROT 6.5 01/31/2018 0417   PROT 7.3 12/27/2017 1423   ALBUMIN 1.8 (L) 01/31/2018 0417   ALBUMIN 3.2 (L) 12/27/2017 1423   AST 42 (H) 01/31/2018 0417   ALT 12 (L) 01/31/2018 0417   ALKPHOS 69 01/31/2018 0417   BILITOT 1.8 (H) 01/31/2018 0417   BILITOT 0.9 12/27/2017 1423   GFRNONAA 50 (L) 01/17/2018 0500   GFRAA 58 (L) 01/10/2018 0500   Lipase     Component Value Date/Time   LIPASE 46 01/30/2018 1544       Studies/Results: Ct Abdomen Pelvis W Contrast  Result Date: 01/30/2018 CLINICAL DATA:  Patient with abdominal pain. Evaluate for bowel obstruction. EXAM: CT ABDOMEN AND PELVIS WITH CONTRAST TECHNIQUE: Multidetector CT imaging of the abdomen and pelvis was performed using the standard protocol following bolus administration of intravenous contrast. CONTRAST:  191mL ISOVUE-300 IOPAMIDOL (ISOVUE-300) INJECTION 61% COMPARISON:  CT abdomen pelvis 01/22/2018; drainage CT 01/26/2018 FINDINGS: Lower chest: Heart is enlarged. Moderate right and small left pleural effusions with underlying pulmonary consolidation. Hepatobiliary: The liver is lobular in contour. Similar-appearing subcentimeter low-attenuation lesion right hepatic lobe (image 20; series 3). Multiple gallstones. No intrahepatic or extrahepatic biliary ductal dilatation. Pancreas: Unremarkable Spleen: Unremarkable Adrenals/Urinary Tract: Adrenal glands are normal. Kidneys enhance symmetrically with contrast. No hydronephrosis. Bladder is drained with a Foley catheter. Stomach/Bowel: Mild duodenal and proximal small bowel wall thickening. There is small bowel dilatation most  notable within the mid to distal small bowel measuring up to 4.8 cm (image 73; series 3). There is transition to decompressed small bowel within the central lower abdomen. Small amount of free fluid within the upper abdomen surrounding the liver and spleen. There is wall thickening of the duodenum and proximal small bowel. No definite free  intraperitoneal air. Vascular/Lymphatic: Peripheral calcified atherosclerotic plaque. No retroperitoneal lymphadenopathy. Multiple collateral vessels within the left upper hemiabdomen compatible with portal venous hypertension. Reproductive: Status post hysterectomy. Other: Anasarca. Right lower quadrant pigtail drainage catheter is demonstrated. This is located centrally and has drained one of the previously described right lower quadrant fluid collections. There is an additional complex cystic collection within the right pelvic sidewall which measures 3.9 x 3.6 cm (image 64; series 3). There is a loculated fluid collection within the pelvis which measures 3.9 x 7.2 cm (image 73; series 3). Musculoskeletal: Lumbar spine degenerative changes. There is expansion of the left upper inner muscle (image 84; series 3). IMPRESSION: 1. The small bowel is markedly dilated with transition to decompressed small bowel within the pelvis concerning for small bowel obstruction. Additionally there is wall thickening of the duodenum and proximal small bowel concerning for enteritis which may be reactive, infectious, inflammatory or ischemic in etiology. 2. Interval insertion right lower quadrant pigtail drainage catheter with drainage of one of the right lower quadrant fluid collections. The adjacent right pelvic sidewall complex fluid collection persists and may represent an abscess or potentially complex ovarian lesion. Consider further evaluation of this lesion with pelvic ultrasound. Additionally there is a loculated fluid collection within the pelvis. 3. Interval expansion of the left obturator muscle. This may be secondary to intramuscular hematoma or potentially intramuscular infection. 4. Large right and moderate left pleural effusions with underlying pulmonary consolidation which may represent atelectasis or infection. 5. Cholelithiasis. 6. Anasarca. 7. Cirrhosis and portal venous hypertension. 8. These results will be  called to the ordering clinician or representative by the Radiologist Assistant, and communication documented in the PACS or zVision Dashboard. Electronically Signed   By: Lovey Newcomer M.D.   On: 01/30/2018 18:32   Dg Abd Portable 1v  Result Date: 01/31/2018 CLINICAL DATA:  Status post NG tube placement. EXAM: PORTABLE ABDOMEN - 1 VIEW COMPARISON:  01/31/2018 FINDINGS: The nasogastric tube tip and side port are well below the level of the GE junction. The tip is in the projection of the distal body of stomach gaseous distension of the large and small bowel loops are again noted compatible with ileus versus partial obstruction. The degree of bowel distention is not significantly changed from comparison exam IMPRESSION: 1. NG tube tip projects over the expected location of the distal body of stomach. 2. No change in small bowel obstruction pattern. Electronically Signed   By: Kerby Moors M.D.   On: 01/31/2018 16:56   Dg Abd Portable 1v  Result Date: 01/31/2018 CLINICAL DATA:  84 year old female with evidence of small bowel obstruction in the pelvis on CT yesterday. Fluid collections, percutaneous drainage catheter placed. EXAM: PORTABLE ABDOMEN - 1 VIEW COMPARISON:  CT Abdomen and Pelvis 01/30/2018 and earlier. FINDINGS: Portable AP supine views at 0953 hours. Right lower quadrant pigtail drain remains in place. There is gas throughout small and large bowel loops. Small bowel loops in the mid to lower abdomen remain dilated up to 48 millimeters diameter. Oral contrast yesterday now appears to be primarily within the proximal colon. Superimposed evidence of ascites, and the right pleural effusion demonstrated yesterday. No  definite pneumoperitoneum on these supine views. No acute osseous abnormality identified. IMPRESSION: 1. Stable bowel gas pattern since yesterday compatible with a high-grade partial small bowel obstruction, but there may be superimposed small and large bowel ileus as well. 2. Evidence of  ascites and pleural effusion redemonstrated. 3. Stable right lower quadrant percutaneous drain. Electronically Signed   By: Genevie Ann M.D.   On: 01/31/2018 10:22    Anti-infectives: Anti-infectives (From admission, onward)   Start     Dose/Rate Route Frequency Ordered Stop   01/29/18 1000  vancomycin (VANCOCIN) 500 mg in sodium chloride 0.9 % 100 mL IVPB     500 mg 100 mL/hr over 60 Minutes Intravenous Every 12 hours 01/28/18 1411     01/28/18 1500  vancomycin (VANCOCIN) IVPB 1000 mg/200 mL premix     1,000 mg 200 mL/hr over 60 Minutes Intravenous  Once 01/28/18 1411 01/29/18 0102   01/22/18 1600  piperacillin-tazobactam (ZOSYN) IVPB 3.375 g     3.375 g 12.5 mL/hr over 240 Minutes Intravenous Every 8 hours 01/22/18 1014     01/20/18 1500  piperacillin-tazobactam (ZOSYN) IVPB 2.25 g  Status:  Discontinued     2.25 g 100 mL/hr over 30 Minutes Intravenous Every 6 hours 01/20/18 1211 01/22/18 1014   01/20/18 0500  piperacillin-tazobactam (ZOSYN) IVPB 2.25 g  Status:  Discontinued     2.25 g 100 mL/hr over 30 Minutes Intravenous Every 6 hours 01/30/2018 2220 01/20/18 1211   01/31/2018 2330  rifaximin (XIFAXAN) tablet 550 mg     550 mg Oral 2 times daily 01/21/2018 2224     01/16/2018 2230  piperacillin-tazobactam (ZOSYN) IVPB 3.375 g     3.375 g 100 mL/hr over 30 Minutes Intravenous  Once 01/20/2018 2220 01/23/2018 2300       Assessment/Plan Perforated appendicitis with abscess versus neoplasia. Radiology favors appendicitis. Significant leukocytosis - 36.6 today  Status post MVR and AVR on Coumadin Currently on heparin drip with INR 1.74, ordering 2 units FFP  S/p perc drain 4/24- purulent output  Seems to be worsening - SBO secondary to her inflammatory process. To OR for  laparotomy. This will likely result in ileocecal resection and probably permanent ileostomy. Per the [employer] of surgeons risk calculator.Due to MMP and profound deconditionong/age estimate50% chance of  major complications. 40 to 50% mortality risk. 70%, at least, risk of discharge to SNF.  Repeat CT 4/28 showing sbo with wall thickening of duodenum and proximal small bowel, persistent complex fluid collections in pelvis  FEN: TPN VTE: heparin gtt stopped  ID: PO rifaximin 4/17>>, Zosyn 4/20>>, vancomycin 4/27>4/30 - discussed with pharmacist and planning to stop vanc with no suspicion of MRSA infection at this time, will reconsult if we restart it   2 units FFP. To OR today.     LOS: 13 days    Brigid Re , [facility] 01/03/2018, 7:52 AM Pager: [phone] Consults: [phone] Mon-Fri 7:00 am-4:30 pm Sat-Sun 7:00 am-11:30 am

## 2018-02-01 NOTE — Anesthesia Procedure Notes (Signed)
Procedure Name: Intubation Date/Time: 01/24/2018 12:48 PM Performed by: White, Amedeo Plenty, CRNA Pre-anesthesia Checklist: Patient identified, Emergency Drugs available, Suction available and Patient being monitored Patient Re-evaluated:Patient Re-evaluated prior to induction Oxygen Delivery Method: Circle System Utilized Preoxygenation: Pre-oxygenation with 100% oxygen Induction Type: IV induction, Rapid sequence and Cricoid Pressure applied Laryngoscope Size: Mac and 3 Grade View: Grade I Tube type: Oral Tube size: 7.0 mm Number of attempts: 1 Airway Equipment and Method: Stylet Placement Confirmation: ETT inserted through vocal cords under direct vision,  positive ETCO2 and breath sounds checked- equal and bilateral Secured at: 21 cm Tube secured with: Tape Dental Injury: Teeth and Oropharynx as per pre-operative assessment

## 2018-02-01 NOTE — Anesthesia Procedure Notes (Signed)
Arterial Line Insertion Start/End04/07/2018 12:25 PM, 01/31/2018 12:30 PM Performed by: Roberts Gaudy, MD, anesthesiologist  Left, femoral was placed Catheter size: 20 G Hand hygiene performed , maximum sterile barriers used  and Seldinger technique used  Attempts: 1 Procedure performed without using ultrasound guided technique. Ultrasound Notes:anatomy identified, needle tip was noted to be adjacent to the nerve/plexus identified and no ultrasound evidence of intravascular and/or intraneural injection Following insertion, line sutured, dressing applied and Biopatch. Patient tolerated the procedure well with no immediate complications.

## 2018-02-01 NOTE — Anesthesia Postprocedure Evaluation (Signed)
Anesthesia Post Note  Patient: [name] Chung  Procedure(s) Performed: EXPLORATORY LAPOROTOMY (N/A Abdomen) APPENDECTOMY (N/A Abdomen) SMALL BOWEL RESECTION (N/A Abdomen) RIGHT SALPINGECTOMY (Right Abdomen)     Patient location during evaluation: PACU Anesthesia Type: General Level of consciousness: sedated and patient remains intubated per anesthesia plan Pain management: pain level controlled Vital Signs Assessment: post-procedure vital signs reviewed and stable Respiratory status: patient remains intubated per anesthesia plan and patient on ventilator - see flowsheet for VS Cardiovascular status: stable Postop Assessment: no apparent nausea or vomiting Anesthetic complications: no    Last Vitals:  Vitals:   01/18/2018 1518 01/09/2018 1530  BP: (!) 148/56 (!) 153/48  Pulse: (!) 110 (!) 112  Resp: 14 14  Temp:    SpO2: 100% 100%    Last Pain:  Vitals:   01/18/2018 1121  TempSrc: Oral  PainSc:                  Taraann Olthoff COKER

## 2018-02-01 NOTE — Progress Notes (Signed)
Received from OR with Vasopressin at 0.03unit/hr, Precedex at 31mcg/kg/hr, Cardizem at 10mg /hr.

## 2018-02-01 NOTE — Progress Notes (Signed)
No charge note:   Spoke with [name] Chung. She requested to meet tomorrow morning at [place], AGNP-C Palliative Medicine  Please call Palliative Medicine team phone with any questions [phone]. For individual providers please see AMION.

## 2018-02-01 NOTE — Addendum Note (Signed)
Addendum  created 01/16/2018 1917 by Glyn Ade, CRNA   Charge Capture section accepted, Visit diagnoses modified

## 2018-02-02 ENCOUNTER — Inpatient Hospital Stay (HOSPITAL_COMMUNITY): Payer: Medicare Other

## 2018-02-02 ENCOUNTER — Encounter (HOSPITAL_COMMUNITY): Payer: Self-pay | Admitting: Surgery

## 2018-02-02 DIAGNOSIS — K567 Ileus, unspecified: Secondary | ICD-10-CM

## 2018-02-02 DIAGNOSIS — Z515 Encounter for palliative care: Secondary | ICD-10-CM

## 2018-02-02 DIAGNOSIS — I5033 Acute on chronic diastolic (congestive) heart failure: Secondary | ICD-10-CM

## 2018-02-02 DIAGNOSIS — Z7189 Other specified counseling: Secondary | ICD-10-CM

## 2018-02-02 LAB — PREPARE FRESH FROZEN PLASMA: UNIT DIVISION: 0

## 2018-02-02 LAB — BPAM RBC
BLOOD PRODUCT EXPIRATION DATE: 201905232359
BLOOD PRODUCT EXPIRATION DATE: 201905242359
BLOOD PRODUCT EXPIRATION DATE: 201905252359
Blood Product Expiration Date: 201905242359
Blood Product Expiration Date: 201905252359
ISSUE DATE / TIME: 201904301259
ISSUE DATE / TIME: 201904301328
ISSUE DATE / TIME: 201904281707
ISSUE DATE / TIME: 201904301259
ISSUE DATE / TIME: 201904301328
UNIT TYPE AND RH: 6200
UNIT TYPE AND RH: 6200
UNIT TYPE AND RH: 6200
UNIT TYPE AND RH: 6200
Unit Type and Rh: 6200

## 2018-02-02 LAB — TYPE AND SCREEN
ABO/RH(D): A POS
Antibody Screen: NEGATIVE
UNIT DIVISION: 0
Unit division: 0
Unit division: 0
Unit division: 0
Unit division: 0

## 2018-02-02 LAB — PROTIME-INR
INR: 1.66
PROTHROMBIN TIME: 19.5 s — AB (ref 11.4–15.2)

## 2018-02-02 LAB — COMPREHENSIVE METABOLIC PANEL
ALT: 12 U/L — AB (ref 14–54)
ANION GAP: 7 (ref 5–15)
AST: 29 U/L (ref 15–41)
Albumin: 1.7 g/dL — ABNORMAL LOW (ref 3.5–5.0)
Alkaline Phosphatase: 45 U/L (ref 38–126)
BUN: 50 mg/dL — ABNORMAL HIGH (ref 6–20)
CHLORIDE: 105 mmol/L (ref 101–111)
CO2: 30 mmol/L (ref 22–32)
CREATININE: 0.98 mg/dL (ref 0.44–1.00)
Calcium: 8.2 mg/dL — ABNORMAL LOW (ref 8.9–10.3)
GFR, EST NON AFRICAN AMERICAN: 52 mL/min — AB (ref >60)
Glucose, Bld: 272 mg/dL — ABNORMAL HIGH (ref 65–99)
POTASSIUM: 3.6 mmol/L (ref 3.5–5.1)
SODIUM: 142 mmol/L (ref 135–145)
Total Bilirubin: 2.5 mg/dL — ABNORMAL HIGH (ref 0.3–1.2)
Total Protein: 4.4 g/dL — ABNORMAL LOW (ref 6.5–8.1)

## 2018-02-02 LAB — CBC WITH DIFFERENTIAL/PLATELET
BASOS PCT: 0 %
Band Neutrophils: 5 %
Basophils Absolute: 0 10*3/uL (ref 0.0–0.1)
Blasts: 0 %
EOS PCT: 0 %
Eosinophils Absolute: 0 10*3/uL (ref 0.0–0.7)
HCT: 26.8 % — ABNORMAL LOW (ref 36.0–46.0)
Hemoglobin: 8.8 g/dL — ABNORMAL LOW (ref 12.0–15.0)
LYMPHS ABS: 1.9 10*3/uL (ref 0.7–4.0)
LYMPHS PCT: 5 %
MCH: 31.2 pg (ref 26.0–34.0)
MCHC: 32.8 g/dL (ref 30.0–36.0)
MCV: 95 fL (ref 78.0–100.0)
MONO ABS: 1.5 10*3/uL — AB (ref 0.1–1.0)
Metamyelocytes Relative: 1 %
Monocytes Relative: 4 %
Myelocytes: 0 %
NEUTROS ABS: 33.8 10*3/uL — AB (ref 1.7–7.7)
NEUTROS PCT: 85 %
NRBC: 0 /100{WBCs}
OTHER: 0 %
PLATELETS: 178 10*3/uL (ref 150–400)
PROMYELOCYTES RELATIVE: 0 %
RBC: 2.82 MIL/uL — ABNORMAL LOW (ref 3.87–5.11)
RDW: 18.1 % — AB (ref 11.5–15.5)
WBC: 37.2 10*3/uL — ABNORMAL HIGH (ref 4.0–10.5)

## 2018-02-02 LAB — BPAM FFP
BLOOD PRODUCT EXPIRATION DATE: 201904302359
BLOOD PRODUCT EXPIRATION DATE: 201905052359
Blood Product Expiration Date: 201904302359
ISSUE DATE / TIME: 201904301059
ISSUE DATE / TIME: 201904301326
ISSUE DATE / TIME: 201904301326
UNIT TYPE AND RH: 6200
Unit Type and Rh: 6200
Unit Type and Rh: 6200

## 2018-02-02 LAB — GLUCOSE, CAPILLARY
GLUCOSE-CAPILLARY: 139 mg/dL — AB (ref 65–99)
GLUCOSE-CAPILLARY: 194 mg/dL — AB (ref 65–99)
GLUCOSE-CAPILLARY: 261 mg/dL — AB (ref 65–99)
Glucose-Capillary: 177 mg/dL — ABNORMAL HIGH (ref 65–99)
Glucose-Capillary: 164 mg/dL — ABNORMAL HIGH (ref 65–99)
Glucose-Capillary: 161 mg/dL — ABNORMAL HIGH (ref 65–99)

## 2018-02-02 LAB — PREPARE PLATELET PHERESIS: UNIT DIVISION: 0

## 2018-02-02 LAB — HEPARIN LEVEL (UNFRACTIONATED): HEPARIN UNFRACTIONATED: 0.25 [IU]/mL — AB (ref 0.30–0.70)

## 2018-02-02 LAB — BPAM PLATELET PHERESIS
BLOOD PRODUCT EXPIRATION DATE: 201905011322
ISSUE DATE / TIME: 201904301332
UNIT TYPE AND RH: 6200

## 2018-02-02 MED ORDER — AMIODARONE HCL IN DEXTROSE 360-4.14 MG/200ML-% IV SOLN
60.0000 mg/h | INTRAVENOUS | Status: AC
  Administered 2018-02-02: 60 mg/h via INTRAVENOUS
  Filled 2018-02-02 (×2): qty 200

## 2018-02-02 MED ORDER — AMIODARONE HCL IN DEXTROSE 360-4.14 MG/200ML-% IV SOLN
30.0000 mg/h | INTRAVENOUS | Status: DC
  Administered 2018-02-02 – 2018-02-06 (×9): 30 mg/h via INTRAVENOUS
  Filled 2018-02-02 (×9): qty 200

## 2018-02-02 MED ORDER — FUROSEMIDE 10 MG/ML IJ SOLN
80.0000 mg | Freq: Two times a day (BID) | INTRAMUSCULAR | Status: DC
  Administered 2018-02-02: 80 mg via INTRAVENOUS
  Filled 2018-02-02 (×2): qty 8

## 2018-02-02 MED ORDER — SODIUM CHLORIDE 0.9 % IV SOLN
INTRAVENOUS | Status: DC
  Administered 2018-02-02 – 2018-02-04 (×3): via INTRAVENOUS

## 2018-02-02 MED ORDER — FAMOTIDINE IN NACL 20-0.9 MG/50ML-% IV SOLN
20.0000 mg | Freq: Two times a day (BID) | INTRAVENOUS | Status: AC
  Administered 2018-02-02 – 2018-02-03 (×3): 20 mg via INTRAVENOUS
  Filled 2018-02-02 (×3): qty 50

## 2018-02-02 MED ORDER — FENTANYL CITRATE (PF) 100 MCG/2ML IJ SOLN
12.5000 ug | INTRAMUSCULAR | Status: DC | PRN
  Administered 2018-02-02 – 2018-02-03 (×5): 25 ug via INTRAVENOUS
  Filled 2018-02-02 (×3): qty 2

## 2018-02-02 MED ORDER — TRAVASOL 10 % IV SOLN
INTRAVENOUS | Status: DC
  Filled 2018-02-02: qty 842.4

## 2018-02-02 MED ORDER — HEPARIN (PORCINE) IN NACL 100-0.45 UNIT/ML-% IJ SOLN
1950.0000 [IU]/h | INTRAMUSCULAR | Status: DC
  Administered 2018-02-02: 2000 [IU]/h via INTRAVENOUS
  Administered 2018-02-03: 2100 [IU]/h via INTRAVENOUS
  Filled 2018-02-02 (×3): qty 250

## 2018-02-02 MED ORDER — POTASSIUM CHLORIDE 10 MEQ/50ML IV SOLN
10.0000 meq | INTRAVENOUS | Status: AC
  Administered 2018-02-02 (×4): 10 meq via INTRAVENOUS
  Filled 2018-02-02 (×4): qty 50

## 2018-02-02 MED ORDER — TRAVASOL 10 % IV SOLN
INTRAVENOUS | Status: AC
  Administered 2018-02-02: 17:00:00 via INTRAVENOUS
  Filled 2018-02-02: qty 842.4

## 2018-02-02 MED ORDER — INSULIN ASPART 100 UNIT/ML ~~LOC~~ SOLN
0.0000 [IU] | SUBCUTANEOUS | Status: DC
  Administered 2018-02-02 – 2018-02-03 (×5): 3 [IU] via SUBCUTANEOUS
  Administered 2018-02-03: 2 [IU] via SUBCUTANEOUS
  Administered 2018-02-03: 3 [IU] via SUBCUTANEOUS
  Administered 2018-02-03: 2 [IU] via SUBCUTANEOUS
  Administered 2018-02-03: 3 [IU] via SUBCUTANEOUS
  Administered 2018-02-03 – 2018-02-04 (×3): 2 [IU] via SUBCUTANEOUS
  Administered 2018-02-04 (×3): 3 [IU] via SUBCUTANEOUS
  Administered 2018-02-04: 2 [IU] via SUBCUTANEOUS
  Administered 2018-02-05: 5 [IU] via SUBCUTANEOUS
  Administered 2018-02-05 (×4): 2 [IU] via SUBCUTANEOUS
  Administered 2018-02-05 – 2018-02-06 (×2): 3 [IU] via SUBCUTANEOUS
  Administered 2018-02-06 (×2): 2 [IU] via SUBCUTANEOUS

## 2018-02-02 MED ORDER — AMIODARONE LOAD VIA INFUSION
150.0000 mg | Freq: Once | INTRAVENOUS | Status: AC
  Administered 2018-02-02: 150 mg via INTRAVENOUS
  Filled 2018-02-02: qty 83.34

## 2018-02-02 MED ORDER — FUROSEMIDE 10 MG/ML IJ SOLN: 40.0000 mg | Freq: Two times a day (BID) | INTRAMUSCULAR | Status: DC

## 2018-02-02 NOTE — Progress Notes (Addendum)
Nutrition Follow-up  DOCUMENTATION CODES:   Not applicable  INTERVENTION:    TPN per pharmacy  Monitor for diet advancement/toleration  NUTRITION DIAGNOSIS:   Inadequate oral intake related to acute illness(ileus) as evidenced by per patient/family report.  Ongoing  GOAL:   Patient will meet greater than or equal to 90% of their needs  Meeting with TPN  MONITOR:   Diet advancement, PO intake, Supplement acceptance, Weight trends  REASON FOR ASSESSMENT:   Consult Assessment of nutrition requirement/status  ASSESSMENT:   [name] Chung is an 84 yo female with PMH dementia, chronic A-fib, mechanical AVR & MVR replacement on coumadin, chronic diastolic CHF, Stg III CKD presented to ED 01/18/2018 with 3 days of nausea, nonbloody emesis, abdominal distension, and pain. She was seen by PCP started on cipro for presumed UTI. UA returned negative, so Cipro was stopped, patient also reported diarrhea. CT abdomen 4/20 suggested perforated appendicular abscess and ileus.  Surgery ordered PICC Line and TPN. Currently, vitamin K is being used to reverse INR so IR can place percutaneous drain of abscess.   4/22 - started on TPN for ileus and appendicular abcess 4/24 - s/p Perc drain 4/25 - Clear liquid diet started 4/26 - advanced to NDD2, nectar thick liquids 4/28 - CT abd exhibited SBO with thickening of duodenum and proximal small bowel 4/30 - underwent ex lap, appendectomy, small bowel resection (30cm of ischemic patches removed), right salpingectomy  Remains on pressor support.  Anasarca present at this time.  NGT to suction in place. TPN at 65 ml/hr, providing 84g AA, 250g CHO, and 38g ILE for a total of 1,560 kCal, meets 100% of patient needs prior to intubation Will adjust needs per new ventilator setting.   Patient is currently intubated on ventilator support s/p surgery MV: 8.3 L/min Temp (24hrs), Avg:97.8 F (36.6 C), Min:97.3 F (36.3 C), Max:99.9 F (37.7 C) BP: 126/62  MAP: 74  I/O: +1504 ml since admit UOP: 730 ml x 24 hours Right Abdomen drain: 375 ml x 24 hours   Medications reviewed and include: SSI, IV abx, 10 mEq KCl Labs reviewed: CBG 178- 272 (H)  Diet Order:   Diet Order           Diet NPO time specified  Diet effective now          EDUCATION NEEDS:   Not appropriate for education at this time  Skin:  Skin Assessment: Reviewed RN Assessment Skin Integrity Issues:: Wound VAC Wound Vac: abdomen  Last BM:  01/29/18  Height:   Ht Readings from Last 1 Encounters:  01/05/2018 5\' 6"  (1.676 m)    Weight:   Wt Readings from Last 1 Encounters:  02/02/18 169 lb 1.5 oz (76.7 kg)    Ideal Body Weight:  58 kg  BMI:  Body mass index is 27.29 kg/m.  Estimated Nutritional Needs:   Kcal:  1346 kcal (PSU)  Protein:  81-93 g  Fluid:  >1.3 L/day    [name] Chung RD, LDN Clinical Nutrition Pager # - [phone]

## 2018-02-02 NOTE — Progress Notes (Signed)
Inpatient Diabetes Program Recommendations  AACE/ADA: New Consensus Statement on Inpatient Glycemic Control (2015)  Target Ranges:  Prepandial:   less than 140 mg/dL      Peak postprandial:   less than 180 mg/dL (1-2 hours)      Critically ill patients:  140 - 180 mg/dL   Lab Results  Component Value Date   GLUCAP 164 (H) 02/02/2018    Review of Glycemic Control  Diabetes history: None Outpatient Diabetes medications: None Current orders for Inpatient glycemic control: Novolog 0-15 units Q4H  Inpatient Diabetes Program Recommendations:     ICU Glycemic Control Order Set - Novolog 1-3 units Q4H  Will follow.  Thank you. Lorenda Peck, RD, LDN, CDE Inpatient Diabetes Coordinator [phone]

## 2018-02-02 NOTE — Progress Notes (Signed)
PULMONARY / CRITICAL CARE MEDICINE   Name: [name] Chung MRN: [id number] DOB: 26-Dec-1932    ADMISSION DATE:  01/18/2018 CONSULTATION DATE: 01/09/2018   REFERRING MD: Georgette Dover   CHIEF COMPLAINT: abdominal pain  BRIEF SUMMARY:   84 yr old F with PMH Afib, mechanical atrial / mitral valves on coumadin, CHF who was admitted on 4/17 with abd pain, nausea and vomiting and was found to have small bowel obstruction.  Also found to have UTI. CT abdomen repeated and was showing concern for appendiceal abscess.   A drain was inserted via IR and conservative managemnet was recommended.  Repeated CT scan was showing no resolution of the abscess and her WBC continued to go up in addition to her abdominal pain so she was taken to the OR 4/30 for adhesion lysis, small bowel resection, appendectomy and abscess drainage. Patient required 3 pressors in the OR, returned to ICU intubated.   SUBJECTIVE:  RN reports weaning of levophed, now on 39mcg.  Vasopressin ongoing.  Amiodarone gtt for [facility].  Anasarca.  Afebrile.    VITAL SIGNS: BP (!) 127/55   Pulse (!) 105   Temp 98.2 F (36.8 C) (Oral)   Resp 12   Ht 5\' 6"  (1.676 m)   Wt 169 lb 1.5 oz (76.7 kg)   SpO2 95%   BMI 27.29 kg/m   HEMODYNAMICS:    VENTILATOR SETTINGS: Vent Mode: CPAP;PSV FiO2 (%):  [40 %-100 %] 40 % Set Rate:  [14 bmp-16 bmp] 16 bmp Vt Set:  [470 mL] 470 mL PEEP:  [5 cmH20] 5 cmH20 Pressure Support:  [5 cmH20] 5 cmH20 Plateau Pressure:  [19 cmH20-22 cmH20] 19 cmH20  INTAKE / OUTPUT: I/O last 3 completed shifts: In: 7596.9 [I.V.:3029.9; OXBDZ:3299; Other:1000; NG/GT:100; IV Piggyback:1000] Out: 4085 [Urine:2530; Emesis/NG output:165; Drains:390; Blood:1000]  PHYSICAL EXAMINATION: General: frail elderly female in NAD on vent HEENT: MM pink/moist, ETT Neuro: sedate, opens eyes to voice, generalized weakness  CV: s1s2 rrr, no m/r/g PULM: even/non-labored, lungs bilaterally coarse, crackles in bases  GI: soft, midline wound  VAC, drains x2 with serosanguinous drainage   Extremities: warm/dry, 2-3+ generalized edema  Skin: no rashes or lesions.  Thin fragile skin.  Multiple protective dressings in place.   LABS:  BMET Recent Labs  Lab 01/11/2018 0500  01/23/2018 1428 01/17/2018 1710 02/02/18 0453  NA 140   < > 146* 144 142  K 3.6   < > 3.7 3.4* 3.6  CL 102  --   --  107 105  CO2 27  --   --  28 30  BUN 44*  --   --  45* 50*  CREATININE 1.01*  --   --  0.91 0.98  GLUCOSE 175*  --   --  179* 272*   < > = values in this interval not displayed.    Electrolytes Recent Labs  Lab 01/27/18 0234  01/31/18 0417 01/14/2018 0500 01/09/2018 1710 02/02/18 0453  CALCIUM 7.9*   < > 8.1* 8.3* 8.3* 8.2*  MG 1.9  --  2.2  --   --   --   PHOS 3.0  --  4.0  --   --   --    < > = values in this interval not displayed.    CBC Recent Labs  Lab 01/28/2018 0500  01/12/2018 1428 01/05/2018 1710 02/02/18 0453  WBC 36.6*  --   --  32.0* 37.2*  HGB 8.0*   < > 7.8* 8.6* 8.8*  HCT 24.9*   < >  23.0* 26.2* 26.8*  PLT 184  --   --  160 178   < > = values in this interval not displayed.    Coag's Recent Labs  Lab 01/27/2018 0500 01/26/2018 1710 02/02/18 0453  INR 1.74 1.73 1.66    Sepsis Markers Recent Labs  Lab 01/28/18 1721 01/29/18 0438 01/30/18 0512 01/22/2018 2114  LATICACIDVEN  --   --   --  1.6  PROCALCITON 0.60 0.54 0.62  --     ABG Recent Labs  Lab 01/14/2018 1312 01/15/2018 1428 01/28/2018 2210  PHART 7.444 7.392 7.360  PCO2ART 42.6 43.1 51.2*  PO2ART 122.0* 153.0* 224*    Liver Enzymes Recent Labs  Lab 01/31/18 0417 01/17/2018 1710 02/02/18 0453  AST 42* 30 29  ALT 12* 12* 12*  ALKPHOS 69 43 45  BILITOT 1.8* 3.2* 2.5*  ALBUMIN 1.8* 1.8* 1.7*    Cardiac Enzymes No results for input(s): TROPONINI, PROBNP in the last 168 hours.  Glucose Recent Labs  Lab 01/31/18 2348 01/22/2018 0630 01/18/2018 2020 01/16/2018 2333 02/02/18 0457 02/02/18 0737  GLUCAP 150* 171* 197* 266* 261* 194*     Imaging Dg Chest Port 1 View  Result Date: 02/02/2018 CLINICAL DATA:  Respiratory failure EXAM: PORTABLE CHEST 1 VIEW COMPARISON:  01/24/2018 FINDINGS: Endotracheal tip approximately 7.5 cm above the carina. Left jugular central venous catheter tip SVC. Right arm PICC tip in the SVC. NG enters the stomach with the tip not visualized Diffuse bilateral airspace disease unchanged. Bilateral pleural effusions and bibasilar airspace disease right greater than left also unchanged. IMPRESSION: Endotracheal tube 7.5 cm above the carina Diffuse bilateral airspace disease and bilateral effusions unchanged from yesterday. Electronically Signed   By: Franchot Gallo M.D.   On: 02/02/2018 07:34   Dg Chest Port 1 View  Result Date: 01/29/2018 CLINICAL DATA:  Status post abdominal surgery, respiratory failure. EXAM: PORTABLE CHEST 1 VIEW COMPARISON:  Radiograph of same day. FINDINGS: Stable cardiomegaly. Endotracheal and nasogastric tubes are unchanged in position. No pneumothorax is noted. Right-sided PICC line is unchanged in position. Left internal jugular catheter is noted with distal tip in expected position of the SVC. Central pulmonary vascular congestion is noted. Bilateral perihilar opacities are noted concerning for pneumonia or edema. Stable bilateral pleural effusions are noted, right greater than left with associated atelectasis or edema. Bony thorax is unremarkable. IMPRESSION: Stable support apparatus. Stable bilateral lung opacities are noted concerning for edema with associated pleural effusions. Aortic Atherosclerosis (ICD10-I70.0). Electronically Signed   By: Marijo Conception, M.D.   On: 01/12/2018 21:57   Dg Chest Port 1 View  Result Date: 01/09/2018 CLINICAL DATA:  84 year old female post central line placement. Subsequent encounter. EXAM: PORTABLE CHEST 1 VIEW COMPARISON:  01/15/2018 8:15 a.m. FINDINGS: Endotracheal tube tip 6.2 cm above the carina. Left central line has been placed from left  jugular position. Tip is at the level of the proximal superior vena cava directed laterally. This could impinge upon the lateral wall of the superior vena cava with injection. Right PICC line tip mid superior vena cava level. Nasogastric tube courses below the diaphragm. Tip is not included on the present exam. Post median sternotomy and valve replacement.  Cardiomegaly. Asymmetric airspace disease with right-sided pleural effusion. Pulmonary edema may contribute to this appearance although infectious infiltrate is also consideration in the proper clinical setting. Right base atelectasis. No pneumothorax. Calcified tortuous aorta. IMPRESSION: Since the prior examination, endotracheal tube has been placed with tip 6.2 cm above the carina. Left  central line has been placed from left jugular position. Tip is at the level of the proximal superior vena cava directed laterally. This could impinge upon the lateral wall of the superior vena cava with injection. Remainder of findings without significant changes detailed above. These results will be called to the ordering clinician or representative by the Radiologist Assistant, and communication documented in the PACS or zVision Dashboard. Electronically Signed   By: Genia Del M.D.   On: 01/09/2018 16:19   STUDIES: CT ABD/Pelvis 4/328 >> small bowel dilation concerning for SBO, thickening of duodenum concerning for enteritis, expansion of L obturator muscle, large R / moderate L pleural effusions, cholelithiasis, anasarca, cirrhosis / portal venous hypertension  CULTURES: BCx2 4/17 >> negative BCx2 4/18 >> negative  Abdominal Abscess 4/24 >> abundant bacteroides fragilis, beta lactam positive  ANTIBIOTICS: Zosyn 4/17 >>  SIGNIFICANT EVENTS: 4/30  Exploratory laparotomy, lysis of adhesions, small bowel resection, appendectomy, right salpingectomy, drainage of abdominal abscess  LINES/TUBES: Right PICC 4/21 >> Left CVC IJ 4/30 >>   DISCUSSION: 84 y/o F  with multiple comorbidities including Afib, mechanical atrial and mitral valves on coumadin, CHF with perforated appendix and abdominal abscess collection s/p small bowel resection, appendectomy and abdominal drainage 4/30.  Returned to ICU on vent / pressors.   ASSESSMENT / PLAN:  PULMONARY A: Acute hypoxemic respiratory failure   Acute pulm edema  Bilateral pleural effusion  P:   PRVC 8cc/kg  Wean PEEP / FiO2 for sats >90% Follow intermittent CXR Daily SBT / WUA, meets mechanical criteria but still sleepy  OK with CCS for extubation when meets criteria   CARDIOVASCULAR A:  Septic Shock - in setting of SBO, abdominal abscess Afib with RVR Acute diastolic CHF Acute pulm edema Rheumatic Heart Disease s/p Mechanical Aortic and Mitral valve - on anticoagulation prior to admit P:  Resume heparin without bolus, ok per CCS  Continue amiodarone gtt Wean Levophed to off for MAP > 65 Vasopressin until levophed weaned off  Lasix 80 mg IV Q12 > tolerating / weaning pressors Hold PO digoxin until taking PO's Hold home metolazone Continue IV lopressor 2.5mg  Q8 Cardiology following  RENAL A:   Hyperkalemia   P:   Trend BMP / urinary output Replace electrolytes as indicated Avoid nephrotoxic agents, ensure adequate renal perfusion  GASTROINTESTINAL A:   Abdominal abscess Perforated appendix s/p appendectomy, abscess drainage and small bowel resection Liver cirrhosis   P:   NPO  TPN per pharmacy  Hold PO meds until cleared by surgery  Pepcid IV for SUP  Hold PO lactulose, rifaximin    HEMATOLOGIC A:   Anemia  P:  Trend CBC  Monitor for bleeding with heparin gtt   INFECTIOUS A:   Abdominal abscess s/p drainage  P:   Continue zosyn  Monitor fever curve / WBC trend  Recommendations per CCS > no further plans for return OR at this time  ENDOCRINE A:   Hyperglycemia P:   SSI  NEUROLOGIC A:   Acute metabolic encephalopathy P:   RASS goal: 0 to -1  Fentanyl  gtt for pain  Early PT / OT    FAMILY  - Updates:  Step-daughter updated at bedside am 5/1.  Hopeful for extubation but overall state of health concerning for success.   CC Time: 51 minutes   Noe Gens, NP-C [facility] Pgr: [phone] or if no answer [phone] 02/02/2018, 8:54 AM

## 2018-02-02 NOTE — Care Management Note (Signed)
Case Management Note  Patient Details  Name: [name] Chung MRN: [id number] Date of Birth: 09-22-1933  Subjective/Objective: history ofrheumatic valvular heart disease status post redo aortic valve replacement x3 and redo mitral valve surgery x2, persistent atrial fibrillation, coumadin for anticoagulation, chronic LE edema (multifactorial with diastolic CHF, venous insufficiency, cirrhosis)now admitted withperforated appendix withSBO. PCP noted.                 Action/Plan: When patient medically stable for dc and bed available plan to transfer to SNF, per CSW "[place]" arrangements.  NCM will continue to follow how patient progresses.  Expected Discharge Date:   To be determined.                Expected Discharge Plan:  Skilled Nursing Facility  In-House Referral:  Clinical Social Work  Discharge planning Services  CM Consult  Status of Service:  In process, will continue to follow  If discussed at Long Length of Stay Meetings, dates discussed:    Additional Comments:  Kristen Cardinal, RN 02/02/2018, 12:41 PM

## 2018-02-02 NOTE — Progress Notes (Signed)
PT Cancellation Note  Patient Details Name: [name] Chung MRN: [id number] DOB: September 18, 1933   Cancelled Treatment:    Reason Eval/Treat Not Completed: Patient not medically ready  Continues to be on vent post-op; Discussed with RN who tells me they will be attempting to wean;   Will hold PT at this time and check back tomorrow for appropriateness of PT eval;   Roney Marion, [place] Pager [phone] Office [place] 02/02/2018, 12:30 PM

## 2018-02-02 NOTE — Progress Notes (Addendum)
PHARMACY - ADULT TOTAL PARENTERAL NUTRITION CONSULT NOTE   Pharmacy Consult:  TPN Indication:  Prolonged ileus  Patient Measurements: Height: 5\' 6"  (167.6 cm) Weight: 169 lb 1.5 oz (76.7 kg) IBW/kg (Calculated) : 59.3 TPN AdjBW (KG): 63 Body mass index is 27.29 kg/m.  Assessment:  10 YOF presented on 01/08/2018 with N/V/D and abdominal pain, found to have pSBO.  She was placed on a clear liquid diet on 01/21/18.  01/22/18 CT concerning for perforated appendicitis and patient may need surgery; therefore, she was made NPO again.  Pharmacy consulted to manage TPN for ileus and prolonged NPO status.  Patient transferred to the ICU on 01/25/2018 s/p s/p ex-lap with LoA, SBR, appendectomy, drainage of abd abscess.  Continue TPN with lipids given that patient has been on TPN since 01/23/18 and not a new start.  GI: cirrhosis.  Prealbumin remains <5, LBM 4/27, abscess drain O/P 390mL, NG O/P 80mL.  Docusate, lactulose/rifaximin, PRN Zofran Endo: no hx DM - CBGs were high normal, now elevated d/t stress from surgery 4/30 Insulin requirements in the past 24 hours: 13 units SSI Lytes: K 3.6 (goal >/= 4), iCa low, others WNL Renal: SCr down 0.98, BUN up to 50 - UOP 0.4 ml/kg/hr, net 1.3L since admit Pulm: intubated post-op, FiO2 40% Cards: Rheumatic heart dz - BP controlled, tachy (Afib), CVP 15.  Digoxin, IV Lasix incr 4/29, IV metoprolol, amiodarone started 5/1 AC: Coumadin PTA for Afib / mech valves >> Heparin, s/p Vit K - hgb low, plts WNL, INR 1.66 Hepatobil: LFTs / TG WNL, tbili mildly elevated at 2.5 Neuro: trazodone, PRN morphine - GCS 11, CPOT 0, RASS 1 ID: Zosyn for appendiceal abscess/enteritis, cx grew Actinomyces and Bacteroides - afebrile, WBC up to 37.2 TPN Access: PICC placed 01/23/18 TPN start date: 01/23/18  Nutritional Goals (per RD 4/25): 1365-1575 kCal, 81-93 g protein per day, 1.5-1.6 L fluid/day    Current Nutrition:  TPN   Plan:  Continue TPN at 65 ml/hr, providing 84g AA,  250g CHO, and 38g ILE for a total of 1,560 kCal, meeting 100% of patient needs Electrolytes in TPN: increase K, reduce Na slightly, no change Mg/Ca/Phos, Cl:Ac 2:1 Daily multivitamin and trace elements in TPN Change moderate SSI to Q4H post-op and add 10 units regular insulin to TPN F/U AM labs, CBGs   [name] Chung D. Mina Marble, PharmD, BCPS, BCCCP Pager:  [phone] 02/02/2018, 9:03 AM

## 2018-02-02 NOTE — Progress Notes (Signed)
[facility] Progress Note Patient Name: [name] Chung DOB: 13-Apr-1933 MRN: [id number]   Date of Service  02/02/2018  HPI/Events of Note  AFIB with RVR - Ventricular rate into 110's to 120's. Currently on a Norepinephrine IV infusion. Therefore, can't restart Cardizem IV infusion.   eICU Interventions  Will order: 1. Amiodarone IV bolus and  infusion.      Intervention Category Major Interventions: Arrhythmia - evaluation and management  Yuan Gann [place] 02/02/2018, 2:38 AM

## 2018-02-02 NOTE — Progress Notes (Signed)
ANTICOAGULATION CONSULT NOTE - Follow Up Consult  Pharmacy Consult for Heparin Indication: atrial fibrillation and mechanical AVR & MVR  Patient Measurements: Height: 5\' 6"  (167.6 cm) Weight: 169 lb 1.5 oz (76.7 kg) IBW/kg (Calculated) : 59.3 Heparin Dosing Weight: 58 kg  Vital Signs: Temp: 100.8 F (38.2 C) (05/01 1930) Temp Source: Axillary (05/01 1930) BP: 128/51 (05/01 1900) Pulse Rate: 96 (05/01 1915)  Labs: Recent Labs    01/31/18 0417 01/11/2018 0500  01/18/2018 1428 01/28/2018 1710 02/02/18 0453 02/02/18 1850  HGB 7.9* 8.0*   < > 7.8* 8.6* 8.8*  --   HCT 24.9* 24.9*   < > 23.0* 26.2* 26.8*  --   PLT 173 184  --   --  160 178  --   LABPROT 19.1* 20.2*  --   --  20.1* 19.5*  --   INR 1.62 1.74  --   --  1.73 1.66  --   HEPARINUNFRC 0.64 0.44  --   --   --   --  0.25*  CREATININE 1.07* 1.01*  --   --  0.91 0.98  --    < > = values in this interval not displayed.    Estimated Creatinine Clearance: 45.5 mL/min (by C-G formula based on SCr of 0.98 mg/dL).  Assessment:  Anticoag: warfarin PTA for afib and mechanical valve. INR 1.66, Resumed heparin 5/1 am. Heparin level 0.25 low.   Goal of Therapy:  Heparin level 0.3-0.5 units/ml Monitor platelets by anticoagulation protocol: Yes   Plan:  Increase heparin drip 2100 units/hr Daily heparin level and CBC   Evren Shankland S. Alford Highland, PharmD, [facility] Clinical Staff Pharmacist Pager [phone]  02/02/2018 7:34 PM

## 2018-02-02 NOTE — Progress Notes (Signed)
CSW continuing to follow and assist with transfer to SNF when stable  Angi Goodell H. Mesa Janus, LCSW Clinical Social Worker [phone]  

## 2018-02-02 NOTE — Progress Notes (Signed)
1 Day Post-Op   Subjective/Chief Complaint: Appreciate CCM management Patient on Amiodarone, still on vasopressin and levophed No further signs of bleeding - Hgb increased slightly this morning On fentanyl gtt - off Precedex Following commands   Objective: Vital signs in last 24 hours: Temp:  [97.3 F (36.3 C)-98.6 F (37 C)] 98 F (36.7 C) (05/01 0351) Pulse Rate:  [59-126] 108 (05/01 0600) Resp:  [11-30] 12 (05/01 0600) BP: (107-153)/(34-85) 138/58 (05/01 0600) SpO2:  [93 %-100 %] 96 % (05/01 0600) Arterial Line BP: (96-147)/(36-57) 107/37 (04/30 1945) FiO2 (%):  [40 %-100 %] 40 % (05/01 0400) Weight:  [74.2 kg (163 lb 9.3 oz)-76.7 kg (169 lb 1.5 oz)] 76.7 kg (169 lb 1.5 oz) (05/01 0550) Last BM Date: 01/29/18  Intake/Output from previous day: 04/30 0701 - 05/01 0700 In: 6435 [I.V.:2368; IEPPI:9518; NG/GT:100; IV [id number]:160] Out: 2125 [Urine:730; Emesis/NG output:15; Drains:380; Blood:1000] Intake/Output this shift: No intake/output data recorded.  Arousable, intubated, sedated JP drain - serosanguinous IR drain - minimal output VAC with good seal to midline wound - will change on Friday Abd - distended, quiet, less tense than pre-op  Lab Results:  Recent Labs    01/07/2018 1710 02/02/18 0453  WBC 32.0* 37.2*  HGB 8.6* 8.8*  HCT 26.2* 26.8*  PLT 160 178   BMET Recent Labs    01/20/2018 1710 02/02/18 0453  NA 144 142  K 3.4* 3.6  CL 107 105  CO2 28 30  GLUCOSE 179* 272*  BUN 45* 50*  CREATININE 0.91 0.98  CALCIUM 8.3* 8.2*   PT/INR Recent Labs    01/06/2018 1710 02/02/18 0453  LABPROT 20.1* 19.5*  INR 1.73 1.66   ABG Recent Labs    01/16/2018 1428 01/28/2018 2210  PHART 7.392 7.360  HCO3 26.5 28.4*    Studies/Results: Dg Chest 1 View  Result Date: 01/31/2018 CLINICAL DATA:  84 year old female with evidence of small bowel obstruction in the pelvis on CT. Abnormal abdominal and pelvic fluid collections, percutaneous drainage catheter placed.  NG tube may have been pulled back. EXAM: CHEST  1 VIEW COMPARISON:  KUB 01/31/2018 and earlier. FINDINGS: Portable AP semi upright view at 0815 hours. Enteric tube courses to the abdomen as before, tip not included. Veiling opacity persists at the lung bases greater on the right. New coarse and confluent left mid and upper lung opacity since 01/28/2018. No definite new opacity in the right lung. No pneumothorax identified. Stable cardiomegaly and mediastinal contours. Prior valve replacement. IMPRESSION: 1. New confluent left upper lung opacity suspicious for a New Left Lung Pneumonia since 01/28/2018. 2. Continued right greater than left lower lung veiling opacities seen to reflect pleural effusions and atelectasis on the most recent CT Abdomen and Pelvis. Electronically Signed   By: Genevie Ann M.D.   On: 01/13/2018 10:03   Dg Chest Port 1 View  Result Date: 02/02/2018 CLINICAL DATA:  Respiratory failure EXAM: PORTABLE CHEST 1 VIEW COMPARISON:  01/08/2018 FINDINGS: Endotracheal tip approximately 7.5 cm above the carina. Left jugular central venous catheter tip SVC. Right arm PICC tip in the SVC. NG enters the stomach with the tip not visualized Diffuse bilateral airspace disease unchanged. Bilateral pleural effusions and bibasilar airspace disease right greater than left also unchanged. IMPRESSION: Endotracheal tube 7.5 cm above the carina Diffuse bilateral airspace disease and bilateral effusions unchanged from yesterday. Electronically Signed   By: Franchot Gallo M.D.   On: 02/02/2018 07:34   Dg Chest Port 1 View  Result Date: 01/09/2018 CLINICAL  DATA:  Status post abdominal surgery, respiratory failure. EXAM: PORTABLE CHEST 1 VIEW COMPARISON:  Radiograph of same day. FINDINGS: Stable cardiomegaly. Endotracheal and nasogastric tubes are unchanged in position. No pneumothorax is noted. Right-sided PICC line is unchanged in position. Left internal jugular catheter is noted with distal tip in expected position  of the SVC. Central pulmonary vascular congestion is noted. Bilateral perihilar opacities are noted concerning for pneumonia or edema. Stable bilateral pleural effusions are noted, right greater than left with associated atelectasis or edema. Bony thorax is unremarkable. IMPRESSION: Stable support apparatus. Stable bilateral lung opacities are noted concerning for edema with associated pleural effusions. Aortic Atherosclerosis (ICD10-I70.0). Electronically Signed   By: Marijo Conception, M.D.   On: 01/30/2018 21:57   Dg Chest Port 1 View  Result Date: 01/09/2018 CLINICAL DATA:  84 year old female post central line placement. Subsequent encounter. EXAM: PORTABLE CHEST 1 VIEW COMPARISON:  01/29/2018 8:15 a.m. FINDINGS: Endotracheal tube tip 6.2 cm above the carina. Left central line has been placed from left jugular position. Tip is at the level of the proximal superior vena cava directed laterally. This could impinge upon the lateral wall of the superior vena cava with injection. Right PICC line tip mid superior vena cava level. Nasogastric tube courses below the diaphragm. Tip is not included on the present exam. Post median sternotomy and valve replacement.  Cardiomegaly. Asymmetric airspace disease with right-sided pleural effusion. Pulmonary edema may contribute to this appearance although infectious infiltrate is also consideration in the proper clinical setting. Right base atelectasis. No pneumothorax. Calcified tortuous aorta. IMPRESSION: Since the prior examination, endotracheal tube has been placed with tip 6.2 cm above the carina. Left central line has been placed from left jugular position. Tip is at the level of the proximal superior vena cava directed laterally. This could impinge upon the lateral wall of the superior vena cava with injection. Remainder of findings without significant changes detailed above. These results will be called to the ordering clinician or representative by the Radiologist  Assistant, and communication documented in the PACS or zVision Dashboard. Electronically Signed   By: Genia Del M.D.   On: 01/30/2018 16:19   Dg Abd Portable 1v  Result Date: 01/05/2018 CLINICAL DATA:  Bowel obstruction EXAM: PORTABLE ABDOMEN - 1 VIEW COMPARISON:  January 31, 2018 abdominal radiograph and abdominal CT January 30, 2018 FINDINGS: Nasogastric tube tip and side port are in the stomach. There remain loops of dilated small and large bowel. There is contrast in the ascending colon. There is a drain in the right pelvic region. There is a loculated pleural effusion in the lateral right base. IMPRESSION: Nasogastric tube tip and side port in stomach. The bowel gas pattern is more suggestive of ileus than bowel obstruction, although a degree of bowel obstruction is certainly possible. No free air. Loculated pleural effusion right base. Electronically Signed   By: Lowella Grip III M.D.   On: 01/16/2018 09:58   Dg Abd Portable 1v  Result Date: 01/31/2018 CLINICAL DATA:  Status post NG tube placement. EXAM: PORTABLE ABDOMEN - 1 VIEW COMPARISON:  01/31/2018 FINDINGS: The nasogastric tube tip and side port are well below the level of the GE junction. The tip is in the projection of the distal body of stomach gaseous distension of the large and small bowel loops are again noted compatible with ileus versus partial obstruction. The degree of bowel distention is not significantly changed from comparison exam IMPRESSION: 1. NG tube tip projects over the expected location  of the distal body of stomach. 2. No change in small bowel obstruction pattern. Electronically Signed   By: Kerby Moors M.D.   On: 01/31/2018 16:56   Dg Abd Portable 1v  Result Date: 01/31/2018 CLINICAL DATA:  84 year old female with evidence of small bowel obstruction in the pelvis on CT yesterday. Fluid collections, percutaneous drainage catheter placed. EXAM: PORTABLE ABDOMEN - 1 VIEW COMPARISON:  CT Abdomen and Pelvis 01/30/2018  and earlier. FINDINGS: Portable AP supine views at 0953 hours. Right lower quadrant pigtail drain remains in place. There is gas throughout small and large bowel loops. Small bowel loops in the mid to lower abdomen remain dilated up to 48 millimeters diameter. Oral contrast yesterday now appears to be primarily within the proximal colon. Superimposed evidence of ascites, and the right pleural effusion demonstrated yesterday. No definite pneumoperitoneum on these supine views. No acute osseous abnormality identified. IMPRESSION: 1. Stable bowel gas pattern since yesterday compatible with a high-grade partial small bowel obstruction, but there may be superimposed small and large bowel ileus as well. 2. Evidence of ascites and pleural effusion redemonstrated. 3. Stable right lower quadrant percutaneous drain. Electronically Signed   By: Genevie Ann M.D.   On: 01/31/2018 10:22    Anti-infectives: Anti-infectives (From admission, onward)   Start     Dose/Rate Route Frequency Ordered Stop   01/29/18 1000  vancomycin (VANCOCIN) 500 mg in sodium chloride 0.9 % 100 mL IVPB  Status:  Discontinued     500 mg 100 mL/hr over 60 Minutes Intravenous Every 12 hours 01/28/18 1411 01/25/2018 0812   01/28/18 1500  vancomycin (VANCOCIN) IVPB 1000 mg/200 mL premix     1,000 mg 200 mL/hr over 60 Minutes Intravenous  Once 01/28/18 1411 01/29/18 0102   01/22/18 1600  piperacillin-tazobactam (ZOSYN) IVPB 3.375 g     3.375 g 12.5 mL/hr over 240 Minutes Intravenous Every 8 hours 01/22/18 1014     01/20/18 1500  piperacillin-tazobactam (ZOSYN) IVPB 2.25 g  Status:  Discontinued     2.25 g 100 mL/hr over 30 Minutes Intravenous Every 6 hours 01/20/18 1211 01/22/18 1014   01/20/18 0500  piperacillin-tazobactam (ZOSYN) IVPB 2.25 g  Status:  Discontinued     2.25 g 100 mL/hr over 30 Minutes Intravenous Every 6 hours 01/24/2018 2220 01/20/18 1211   01/14/2018 2330  rifaximin (XIFAXAN) tablet 550 mg     550 mg Oral 2 times daily 01/31/2018  2224     01/20/2018 2230  piperacillin-tazobactam (ZOSYN) IVPB 3.375 g     3.375 g 100 mL/hr over 30 Minutes Intravenous  Once 01/21/2018 2220 01/08/2018 2300      Assessment/Plan: s/p Procedure(s): EXPLORATORY LAPOROTOMY (N/A) APPENDECTOMY (N/A) SMALL BOWEL RESECTION (N/A) RIGHT SALPINGECTOMY (Right) Perforated appendicitis with abscess/ pyosalpinx S/p exploratory laparotomy, appendectomy, SB resection, right salpingectomy, drain, wound VAC on 01/23/2018 - Bessy Reaney   Status post MVR and AVR on Coumadin  S/p perc drain 4/24- purulent output   FEN: TPN May restart heparin gtt with NO BOLUS - monitor closely for signs of bleeding Vent wean per CCM - no plans to return to OR Await bowel function before initiating tube feeds or PO intake     LOS: 14 days    [name] Chung 02/02/2018

## 2018-02-02 NOTE — Consult Note (Signed)
Consultation Note Date: 02/02/2018   Patient Name: [name] Chung  DOB: [birth year]  MRN: [id number]  Age / Sex: 84 y.o., female  PCP: Lajean Manes, MD Referring Physician: Shelly Coss, MD  Reason for Consultation: Establishing goals of care  HPI/Patient Profile: 84 y.o. female  with past medical history of NASH cirrhosis, CHF, Afib, CAD with artificial valve replacements on coumadin, admitted on 01/18/2018 with progressing nausea, and abdominal pain. Workup revealed UTI, SBO, appendicitis, and abdominal abscess. During admission- drain was placed by IR, patient was started on TPN, however, conservative measures failed and patient was taken for exploratory lap on on 4/30. She was found to have abdominal adhesions, ischemic small bowel, pyosalpinx, and perforated appendix. She returned to ICU on IV pressors and intubated after surgery. Palliative medicine was consulted prior to surgery- but family had chosen to go to surgery before palliative meeting was set. Meeting today for continued [place] and support.    Clinical Assessment and Goals of Care:  I have reviewed medical records including EPIC notes, labs and imaging, received report from Dr. Tawanna Solo, and patient's RN, assessed the patient and then met at the bedside along with patient's step daughter- [name] Chung  to discuss diagnosis prognosis, GOC, EOL wishes, disposition and options.  I introduced Palliative Medicine as specialized medical care for people living with serious illness. It focuses on providing relief from the symptoms and stress of a serious illness. The goal is to improve quality of life for both the patient and the family.  We discussed a brief life review of the patient. 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 She is married and cares for her husband who has dementia. She was living at [facility] independent living  prior to admission.   As far as functional and nutritional status- prior to admission she was completely dependent with ADL's and had good nutritional status. She had recovered well from what was believed to be an episode of hepatic congestion due to CHF with lactulose and rifaxin and was back to her baseline.     We discussed their current illness and what it means in the larger context of their on-going co-morbidities.  Natural disease trajectory and expectations at EOL were discussed. I discussed with [name] Chung that while patient did better than expected during surgery- she has difficult road for recovery. We discussed need for independence from ventilator, return of bowel function, return of ability to swallow, wound healing, complications of deconditioning and long term hospitalization. This is all in the setting of her CHF, poor po intake prior to surgery with low albumin (1.7). [name] Chung notes that [name] Chung was dearly devoted to her husband and wanted to do everything possible to attempt to return to him.  I attempted to elicit values and goals of care important to the patient. Being with her husband and caring for him were most important to [name] Chung.   The difference between aggressive medical intervention and comfort care was considered in light of the patient's goals of care. For now,  [name] Chung believes that continued aggressive medical interventions best align with [name] Chung's GOC.  Advanced directives, concepts specific to code status, artifical feeding and hydration, and rehospitalization were considered and discussed. We discussed code status and how chest compressions may not benefit patient with large abdominal wound should her heart decompensate to the point that it would stop. We discussed unlikely recovery to a point where she would be able to care for her husband if she should require CPR. [name] Chung acknowledges that she should discuss this with her brother so they can be prepared to make this decision.     Questions and concerns were addressed.  PMT contact information was given.    Primary Decision Maker HCPOA- [name] Chung    SUMMARY OF RECOMMENDATIONS -Continue full scope of care -PMT will continue to follow and intervene if pt fails to progress or declines and will provide holistic support to patient and family    Code Status/Advance Care Planning:  Full code  Palliative Prophylaxis:   Frequent Pain Assessment  Additional Recommendations (Limitations, Scope, Preferences):  Full Scope Treatment  Prognosis:    Unable to determine  Discharge Planning: To Be Determined  Primary Diagnoses: Present on Admission: . Chronic atrial fibrillation ([place]) . Partial small bowel obstruction ([place]) . Acute renal failure superimposed on stage 3 chronic kidney disease ([place]) . Atrial fibrillation with RVR ([place]) . Chronic systolic (congestive) heart failure ([place]) . Hyponatremia . Sepsis ([place]) . Liver cirrhosis ([place])   I have reviewed the medical record, interviewed the patient and family, and examined the patient. The following aspects are pertinent.  Past Medical History:  Diagnosis Date  . Aortic insufficiency   . Arthritis   . Chronic anticoagulation   . Chronic atrial fibrillation ([place])   . Edema   . Rheumatic heart disease   . Valvular heart disease    Social History   Socioeconomic History  . Marital status: Married    Spouse name: Not on file  . Number of children: 0  . Years of education: Not on file  . Highest education level: Not on file  Occupational History  . Occupation: Best boy: [employer]    Comment: retired  Scientific laboratory technician  . Financial resource strain: Not on file  . Food insecurity:    Worry: Not on file    Inability: Not on file  . Transportation needs:    Medical: Not on file    Non-medical: Not on file  Tobacco Use  . Smoking status: Never Smoker  . Smokeless tobacco: Never Used  Substance and Sexual Activity  . Alcohol use:  No  . Drug use: No  . Sexual activity: Never  Lifestyle  . Physical activity:    Days per week: Not on file    Minutes per session: Not on file  . Stress: Not on file  Relationships  . Social connections:    Talks on phone: Not on file    Gets together: Not on file    Attends religious service: Not on file    Active member of club or organization: Not on file    Attends meetings of clubs or organizations: Not on file    Relationship status: Not on file  Other Topics Concern  . Not on file  Social History Narrative  . Not on file   Family History  Problem Relation Age of Onset  . Heart attack Mother   . Stroke Mother   . Diabetes Mother   . Other Father  CAR ACCIDENT   Scheduled Meds: . chlorhexidine gluconate (MEDLINE KIT)  15 mL Mouth Rinse BID  . fentaNYL (SUBLIMAZE) injection  50 mcg Intravenous Once  . furosemide  80 mg Intravenous Q12H  . insulin aspart  0-15 Units Subcutaneous Q4H  . mouth rinse  15 mL Mouth Rinse 10 times per day  . metoprolol tartrate  2.5 mg Intravenous Q8H  . sodium chloride flush  10-40 mL Intracatheter Q12H  . sodium chloride flush  5 mL Intracatheter Q8H   Continuous Infusions: . amiodarone 30 mg/hr (02/02/18 1000)  . famotidine (PEPCID) IV    . fentaNYL infusion INTRAVENOUS 25 mcg/hr (02/02/18 1000)  . heparin    . norepinephrine (LEVOPHED) Adult infusion Stopped (02/02/18 1000)  . piperacillin-tazobactam (ZOSYN)  IV 3.375 g (02/02/18 0505)  . potassium chloride    . TPN ADULT (ION) 65 mL/hr at 01/18/2018 2112  . TPN ADULT (ION)    . vasopressin (PITRESSIN) infusion - *FOR SHOCK* 0.03 Units/min (02/02/18 1000)   PRN Meds:.bisacodyl, hydrALAZINE, midazolam, ondansetron (ZOFRAN) IV, sodium chloride flush Medications Prior to Admission:  Prior to Admission medications   Medication Sig Start Date End Date Taking? Authorizing Provider  digoxin (LANOXIN) 0.125 MG tablet TAKE ONE-HALF TABLET BY  MOUTH DAILY 12/28/17  Yes Martinique, Peter  M, MD  docusate sodium (COLACE) 100 MG capsule Take 100 mg by mouth 2 (two) times daily.   Yes [provider]  ferrous sulfate 325 (65 FE) MG tablet Take 325 mg by mouth daily with breakfast.   Yes [provider]  furosemide (LASIX) 20 MG tablet TAKE 2 TABLETS BY MOUTH TWO TIMES DAILY 12/28/17  Yes [place], Peter M, MD  loperamide (IMODIUM) 2 MG capsule Take 2 mg by mouth as needed for diarrhea or loose stools.   Yes [provider]  metolazone (ZAROXOLYN) 2.5 MG tablet TAKE 1 TABLET BY MOUTH  TWICE WEEKLY 12/28/17  Yes [place], Peter M, MD  Multiple Vitamin (MULTIVITAMIN) capsule Take 1 capsule by mouth daily.   Yes [provider]  potassium chloride (K-DUR) 10 MEQ tablet Take 6 tablets ( 60 meq ) three times a day 12/28/17  Yes [place], Peter M, MD  rifaximin (XIFAXAN) 550 MG TABS tablet Take 550 mg by mouth 2 (two) times daily.   Yes [provider]  traZODone (DESYREL) 100 MG tablet Take 100 mg by mouth at bedtime.    Yes [provider]  warfarin (COUMADIN) 5 MG tablet TAKE 1 TO 2 TABLETS BY  MOUTH EVERY DAY AS DIRECTED BY COUMADIN CLINIC Patient taking differently: Take 5 mg by mouth one time only at 6 PM. 5 mg on Sunday and Thursday All other days 2.5 mg 12/28/17  Yes [place], Peter M, MD  spironolactone (ALDACTONE) 25 MG tablet Take 25 mg by mouth daily. 01/08/2018   [provider]   Allergies  Allergen Reactions  . Asa Buff (Mag [Buffered Aspirin] Other (See Comments)    On coumadin  . Codeine   . Mirtazapine Nausea Only  . Ultram [Tramadol Hcl]    Review of Systems  Unable to perform ROS: Intubated    Physical Exam  Constitutional:  frail  Cardiovascular:  tachycardic  Neurological:  Sedated, opens eyes to voice briefly, does not follow my commands  Skin: There is pallor.  Nursing note and vitals reviewed.   Vital Signs: BP 119/61   Pulse (!) 52   Temp 98.2 F (36.8 C) (Oral)   Resp 12   Ht  _0  (1.676 m)    Wt 76.7 kg (169 lb 1.5 oz)   SpO2 94%   BMI 27.29 kg/m  Pain Scale: CPOT POSS *See Group Information*: S-Acceptable,Sleep, easy to arouse Pain Score: (sedated)   SpO2: SpO2: 94 % O2 Device:SpO2: 94 % O2 Flow Rate: .O2 Flow Rate (L/min): 4 L/min  IO: Intake/output summary:   Intake/Output Summary (Last 24 hours) at 02/02/2018 1042 Last data filed at 02/02/2018 1000 Gross per 24 hour  Intake 6630.02 ml  Output 2165 ml  Net 4465.02 ml    LBM: Last BM Date: 01/29/18 Baseline Weight: Weight: 51.7 kg (114 lb) Most recent weight: Weight: 76.7 kg (169 lb 1.5 oz)     Palliative Assessment/Data: PPS: 10%     Thank you for this consult. Palliative medicine will continue to follow and assist as needed.   Time In: 1000 Time Out: 1115 Time Total: 75 mins Greater than 50%  of this time was spent counseling and coordinating care related to the above assessment and plan.  Signed by: Mariana Kaufman, AGNP-C Palliative Medicine    Please contact Palliative Medicine Team phone at [phone] for questions and concerns.  For individual provider: See Shea Evans

## 2018-02-02 NOTE — Progress Notes (Signed)
ANTICOAGULATION CONSULT NOTE - Follow Up Consult  Pharmacy Consult for Heparin Indication: atrial fibrillation and mechanical AVR & MVR  Patient Measurements: Height: 5\' 6"  (167.6 cm) Weight: 169 lb 1.5 oz (76.7 kg) IBW/kg (Calculated) : 59.3 Heparin Dosing Weight: 58 kg  Vital Signs: Temp: 99.9 F (37.7 C) (05/01 1139) Temp Source: Axillary (05/01 1139) BP: 126/62 (05/01 1300) Pulse Rate: 121 (05/01 1315)  Labs: Recent Labs    01/31/18 0417 01/16/2018 0500  01/13/2018 1428 01/29/2018 1710 02/02/18 0453  HGB 7.9* 8.0*   < > 7.8* 8.6* 8.8*  HCT 24.9* 24.9*   < > 23.0* 26.2* 26.8*  PLT 173 184  --   --  160 178  LABPROT 19.1* 20.2*  --   --  20.1* 19.5*  INR 1.62 1.74  --   --  1.73 1.66  HEPARINUNFRC 0.64 0.44  --   --   --   --   CREATININE 1.07* 1.01*  --   --  0.91 0.98   < > = values in this interval not displayed.    Estimated Creatinine Clearance: 45.5 mL/min (by C-G formula based on SCr of 0.98 mg/dL).  Assessment: 84 yr old female on warfarin prior to admission for mechanical AVR & MVR and atrial fibrillation. Warfarin has been held due to elevated INR and need for procedures. Patient has received vitamin K and FFP to reverse INR.  Anticoag: warfarin PTA for afib and mechanical valve- INR 5.98 on admit(last dose PTA 4/17) INR 1.66, Resume heparin 5/1 am  INR goal 2.5 - 3.5 - pta dose 5 mg MWF, 2.5 mg AOD  Renal: AKI resolved- SCr 1.01 (peak 1.88), SCr 0.98  Heme/Onc: H&H 8.8/26.8, Plt 178   Goal of Therapy:  Heparin level 0.3-0.5 units/ml Monitor platelets by anticoagulation protocol: Yes   Plan:  Resume heparin drip 2000 units/hr 1800 HL Daily heparin level and CBC F/u return to warfarin  Levester Fresh, PharmD, BCPS, BCCCP Clinical Pharmacist Clinical phone for 02/02/2018 from 7a-3:30p: [phone] If after 3:30p, please call main pharmacy at: [phone] 02/02/2018 1:37 PM

## 2018-02-02 NOTE — Progress Notes (Signed)
[facility] Progress Note Patient Name: [name] Chung DOB: 03/30/1933 MRN: [id number]   Date of Service  02/02/2018  HPI/Events of Note  ETT advanced d/t cuff leak - Request for CXR to assess position.   eICU Interventions  Will order: 1. Portable CXR STAT.      Intervention Category Major Interventions: Other:  Lysle Dingwall 02/02/2018, 5:49 AM

## 2018-02-02 NOTE — Progress Notes (Signed)
Progress Note  Patient Name: [name] Chung Date of Encounter: 02/02/2018  Primary Cardiologist: Peter Martinique, MD  Subjective   Intubated, just received pain medications.   Inpatient Medications    Scheduled Meds: . chlorhexidine gluconate (MEDLINE KIT)  15 mL Mouth Rinse BID  . fentaNYL (SUBLIMAZE) injection  50 mcg Intravenous Once  . furosemide  80 mg Intravenous Q12H  . insulin aspart  0-15 Units Subcutaneous Q4H  . mouth rinse  15 mL Mouth Rinse 10 times per day  . metoprolol tartrate  2.5 mg Intravenous Q8H  . sodium chloride flush  10-40 mL Intracatheter Q12H  . sodium chloride flush  5 mL Intracatheter Q8H   Continuous Infusions: . amiodarone 30 mg/hr (02/02/18 1000)  . famotidine (PEPCID) IV 20 mg (02/02/18 1055)  . fentaNYL infusion INTRAVENOUS 25 mcg/hr (02/02/18 1000)  . heparin    . norepinephrine (LEVOPHED) Adult infusion Stopped (02/02/18 1000)  . piperacillin-tazobactam (ZOSYN)  IV 3.375 g (02/02/18 0505)  . potassium chloride 10 mEq (02/02/18 1055)  . TPN ADULT (ION) 65 mL/hr at 01/24/2018 2112  . TPN ADULT (ION)    . vasopressin (PITRESSIN) infusion - *FOR SHOCK* 0.03 Units/min (02/02/18 1000)   PRN Meds: bisacodyl, hydrALAZINE, midazolam, ondansetron (ZOFRAN) IV, sodium chloride flush   Vital Signs    Vitals:   02/02/18 0930 02/02/18 0945 02/02/18 1000 02/02/18 1139  BP:   119/61   Pulse: (!) 101 (!) 110 (!) 52   Resp: _0 Temp:    99.9 F (37.7 C)  TempSrc:    Axillary  SpO2: 94% 93% 94%   Weight:      Height:        Intake/Output Summary (Last 24 hours) at 02/02/2018 1215 Last data filed at 02/02/2018 1000 Gross per 24 hour  Intake 5530.02 ml  Output 2165 ml  Net 3365.02 ml   Filed Weights   01/16/2018 0500 01/27/2018 2019 02/02/18 0550  Weight: 162 lb (73.5 kg) 163 lb 9.3 oz (74.2 kg) 169 lb 1.5 oz (76.7 kg)    Telemetry    Afib rates 110-130  - Personally Reviewed  ECG    N/a - Personally Reviewed  Physical Exam    General: Frail older W female, intubated on the vent. Head: Normocephalic, atraumatic.  Neck: Supple, + JVD. Lungs:  Resp regular and unlabored, course breath sounds. Heart: Irreg Irreg, S1, S2, no S3, S4, mechanical valve click murmur; no rub. Abdomen: Distended with no BS yet, wound vac in place with serosanguinous drainage.  Extremities: No clubbing, cyanosis, diffuse anasarca.  Neuro: Will awaken to verbal stimuli  Labs    Chemistry Recent Labs  Lab 01/31/18 0417 01/22/2018 0500  01/13/2018 1428 01/22/2018 1710 02/02/18 0453  NA 135 140   < > 146* 144 142  K 5.0 3.6   < > 3.7 3.4* 3.6  CL 101 102  --   --  107 105  CO2 30 27  --   --  28 30  GLUCOSE 191* 175*  --   --  179* 272*  BUN 43* 44*  --   --  45* 50*  CREATININE 1.07* 1.01*  --   --  0.91 0.98  CALCIUM 8.1* 8.3*  --   --  8.3* 8.2*  PROT 6.5  --   --   --  4.5* 4.4*  ALBUMIN 1.8*  --   --   --  1.8* 1.7*  AST 42*  --   --   --  30 29  ALT 12*  --   --   --  12* 12*  ALKPHOS 69  --   --   --  43 45  BILITOT 1.8*  --   --   --  3.2* 2.5*  GFRNONAA 47* 50*  --   --  57* 52*  GFRAA 54* 58*  --   --  >60 >60  ANIONGAP 4* 11  --   --  9 7   < > = values in this interval not displayed.     Hematology Recent Labs  Lab 01/30/2018 0500  01/27/2018 1428 01/22/2018 1710 02/02/18 0453  WBC 36.6*  --   --  32.0* 37.2*  RBC 2.40*  --   --  2.72* 2.82*  HGB 8.0*   < > 7.8* 8.6* 8.8*  HCT 24.9*   < > 23.0* 26.2* 26.8*  MCV 103.8*  --   --  96.3 95.0  MCH 33.3  --   --  31.6 31.2  MCHC 32.1  --   --  32.8 32.8  RDW 20.1*  --   --  17.1* 18.1*  PLT 184  --   --  160 178   < > = values in this interval not displayed.    Cardiac EnzymesNo results for input(s): TROPONINI in the last 168 hours. No results for input(s): TROPIPOC in the last 168 hours.   BNPNo results for input(s): BNP, PROBNP in the last 168 hours.   DDimer No results for input(s): DDIMER in the last 168 hours.    Radiology    Dg Chest 1 View  Result  Date: 01/20/2018 CLINICAL DATA:  84 year old female with evidence of small bowel obstruction in the pelvis on CT. Abnormal abdominal and pelvic fluid collections, percutaneous drainage catheter placed. NG tube may have been pulled back. EXAM: CHEST  1 VIEW COMPARISON:  KUB 01/31/2018 and earlier. FINDINGS: Portable AP semi upright view at 0815 hours. Enteric tube courses to the abdomen as before, tip not included. Veiling opacity persists at the lung bases greater on the right. New coarse and confluent left mid and upper lung opacity since 01/28/2018. No definite new opacity in the right lung. No pneumothorax identified. Stable cardiomegaly and mediastinal contours. Prior valve replacement. IMPRESSION: 1. New confluent left upper lung opacity suspicious for a New Left Lung Pneumonia since 01/28/2018. 2. Continued right greater than left lower lung veiling opacities seen to reflect pleural effusions and atelectasis on the most recent CT Abdomen and Pelvis. Electronically Signed   By: Genevie Ann M.D.   On: 01/11/2018 10:03   Dg Chest Port 1 View  Result Date: 02/02/2018 CLINICAL DATA:  Respiratory failure EXAM: PORTABLE CHEST 1 VIEW COMPARISON:  01/22/2018 FINDINGS: Endotracheal tip approximately 7.5 cm above the carina. Left jugular central venous catheter tip SVC. Right arm PICC tip in the SVC. NG enters the stomach with the tip not visualized Diffuse bilateral airspace disease unchanged. Bilateral pleural effusions and bibasilar airspace disease right greater than left also unchanged. IMPRESSION: Endotracheal tube 7.5 cm above the carina Diffuse bilateral airspace disease and bilateral effusions unchanged from yesterday. Electronically Signed   By: Franchot Gallo M.D.   On: 02/02/2018 07:34   Dg Chest Port 1 View  Result Date: 01/21/2018 CLINICAL DATA:  Status post abdominal surgery, respiratory failure. EXAM: PORTABLE CHEST 1 VIEW COMPARISON:  Radiograph of same day. FINDINGS: Stable cardiomegaly. Endotracheal  and nasogastric tubes are unchanged in position. No pneumothorax is noted. Right-sided PICC  line is unchanged in position. Left internal jugular catheter is noted with distal tip in expected position of the SVC. Central pulmonary vascular congestion is noted. Bilateral perihilar opacities are noted concerning for pneumonia or edema. Stable bilateral pleural effusions are noted, right greater than left with associated atelectasis or edema. Bony thorax is unremarkable. IMPRESSION: Stable support apparatus. Stable bilateral lung opacities are noted concerning for edema with associated pleural effusions. Aortic Atherosclerosis (ICD10-I70.0). Electronically Signed   By: Marijo Conception, M.D.   On: 01/22/2018 21:57   Dg Chest Port 1 View  Result Date: 01/16/2018 CLINICAL DATA:  84 year old female post central line placement. Subsequent encounter. EXAM: PORTABLE CHEST 1 VIEW COMPARISON:  01/17/2018 8:15 a.m. FINDINGS: Endotracheal tube tip 6.2 cm above the carina. Left central line has been placed from left jugular position. Tip is at the level of the proximal superior vena cava directed laterally. This could impinge upon the lateral wall of the superior vena cava with injection. Right PICC line tip mid superior vena cava level. Nasogastric tube courses below the diaphragm. Tip is not included on the present exam. Post median sternotomy and valve replacement.  Cardiomegaly. Asymmetric airspace disease with right-sided pleural effusion. Pulmonary edema may contribute to this appearance although infectious infiltrate is also consideration in the proper clinical setting. Right base atelectasis. No pneumothorax. Calcified tortuous aorta. IMPRESSION: Since the prior examination, endotracheal tube has been placed with tip 6.2 cm above the carina. Left central line has been placed from left jugular position. Tip is at the level of the proximal superior vena cava directed laterally. This could impinge upon the lateral wall of  the superior vena cava with injection. Remainder of findings without significant changes detailed above. These results will be called to the ordering clinician or representative by the Radiologist Assistant, and communication documented in the PACS or zVision Dashboard. Electronically Signed   By: Genia Del M.D.   On: 01/25/2018 16:19   Dg Abd Portable 1v  Result Date: 01/31/2018 CLINICAL DATA:  Bowel obstruction EXAM: PORTABLE ABDOMEN - 1 VIEW COMPARISON:  January 31, 2018 abdominal radiograph and abdominal CT January 30, 2018 FINDINGS: Nasogastric tube tip and side port are in the stomach. There remain loops of dilated small and large bowel. There is contrast in the ascending colon. There is a drain in the right pelvic region. There is a loculated pleural effusion in the lateral right base. IMPRESSION: Nasogastric tube tip and side port in stomach. The bowel gas pattern is more suggestive of ileus than bowel obstruction, although a degree of bowel obstruction is certainly possible. No free air. Loculated pleural effusion right base. Electronically Signed   By: Lowella Grip III M.D.   On: 01/31/2018 09:58   Dg Abd Portable 1v  Result Date: 01/31/2018 CLINICAL DATA:  Status post NG tube placement. EXAM: PORTABLE ABDOMEN - 1 VIEW COMPARISON:  01/31/2018 FINDINGS: The nasogastric tube tip and side port are well below the level of the GE junction. The tip is in the projection of the distal body of stomach gaseous distension of the large and small bowel loops are again noted compatible with ileus versus partial obstruction. The degree of bowel distention is not significantly changed from comparison exam IMPRESSION: 1. NG tube tip projects over the expected location of the distal body of stomach. 2. No change in small bowel obstruction pattern. Electronically Signed   By: Kerby Moors M.D.   On: 01/31/2018 16:56    Cardiac Studies   N/a  Patient Profile     83 y.o. female with a history  ofrheumatic valvular heart disease status post redo aortic valve replacement x3 and redo mitral valve surgery x2, persistent atrial fibrillation, coumadin for anticoagulation, chronic LE edema (multifactorial with diastolic CHF, venous insufficiency, cirrhosis)now admitted with perforated appendix with SBO. We are assisting with management of diastolic heart failure and atrial fibrillation.   Assessment & Plan    1. Rheumatic heart disease s/p AVR x3 and MVR x2: Has a St. Jude mechanical mitral valve and Medtronic freestyle aortic root conduit.  -- coumadin has been held with heparin bridge prior to surgery. Underwent SB resection, exp lap and appendectomy yesterday. Cleared to resume heparin without bolus today. INR 1.66  2. Abd pain 2/2 to perforated appendicitis with abscess: now s/p SB resection, exp lap, and appendectomy with Dr. Georgette Dover yesterday. Required vasopressin and levophed, remains on vent with PCCM managing.  3. Chronic Afib: on IV lopressor and IV amio. Rates have been elevated but improved this afternoon. -- dig held while NPO   4. Acute on Chronic diastolic FH:[id number] volume overload with diffuse anasarca. Albumin 1.7 today. Currently on IV lasix 41m BID. May need to consider giving albumin pending her response to lasix.   5. Anemia: s/p PRBCs and plasma. Hgb 8.8 today.    Signed, LReino Bellis NP  02/02/2018, 12:15 PM  Pager # [phone]  For questions or updates, please contact [place]Please consult [email or web address] for contact info under Cardiology/STEMI.

## 2018-02-02 NOTE — Progress Notes (Signed)
OT Cancellation Note  Patient Details Name: [name] Chung MRN: [id number] DOB: 10/30/1932   Cancelled Treatment:    Reason Eval/Treat Not Completed: Medical issues which prohibited therapy. Will follow.  [name] 02/02/2018, 3:45 PM  02/02/2018 Nestor Lewandowsky, OTR/L Pager: [phone]

## 2018-02-02 DEATH — deceased

## 2018-02-03 ENCOUNTER — Inpatient Hospital Stay (HOSPITAL_COMMUNITY): Payer: Medicare Other

## 2018-02-03 ENCOUNTER — Inpatient Hospital Stay: Payer: Self-pay

## 2018-02-03 DIAGNOSIS — J9601 Acute respiratory failure with hypoxia: Secondary | ICD-10-CM

## 2018-02-03 LAB — BASIC METABOLIC PANEL
Anion gap: 5 (ref 5–15)
BUN: 72 mg/dL — ABNORMAL HIGH (ref 6–20)
CALCIUM: 7.8 mg/dL — AB (ref 8.9–10.3)
CHLORIDE: 111 mmol/L (ref 101–111)
CO2: 27 mmol/L (ref 22–32)
CREATININE: 1.49 mg/dL — AB (ref 0.44–1.00)
GFR calc non Af Amer: 31 mL/min — ABNORMAL LOW (ref >60)
GFR, EST AFRICAN AMERICAN: 36 mL/min — AB (ref >60)
GLUCOSE: 152 mg/dL — AB (ref 65–99)
Potassium: 4.5 mmol/L (ref 3.5–5.1)
Sodium: 143 mmol/L (ref 135–145)

## 2018-02-03 LAB — HEPARIN LEVEL (UNFRACTIONATED)
Heparin Unfractionated: 0.64 IU/mL (ref 0.30–0.70)
Heparin Unfractionated: 0.82 IU/mL — ABNORMAL HIGH (ref 0.30–0.70)
Heparin Unfractionated: <0.1 IU/mL — ABNORMAL LOW (ref 0.30–0.70)

## 2018-02-03 LAB — GLUCOSE, CAPILLARY
GLUCOSE-CAPILLARY: 150 mg/dL — AB (ref 65–99)
GLUCOSE-CAPILLARY: 180 mg/dL — AB (ref 65–99)
GLUCOSE-CAPILLARY: 181 mg/dL — AB (ref 65–99)
Glucose-Capillary: 142 mg/dL — ABNORMAL HIGH (ref 65–99)
Glucose-Capillary: 160 mg/dL — ABNORMAL HIGH (ref 65–99)

## 2018-02-03 LAB — CBC
HCT: 24.8 % — ABNORMAL LOW (ref 36.0–46.0)
HCT: 22.5 % — ABNORMAL LOW (ref 36.0–46.0)
HEMOGLOBIN: 8.3 g/dL — AB (ref 12.0–15.0)
Hemoglobin: 7.3 g/dL — ABNORMAL LOW (ref 12.0–15.0)
MCH: 32.3 pg (ref 26.0–34.0)
MCH: 31.7 pg (ref 26.0–34.0)
MCHC: 33.5 g/dL (ref 30.0–36.0)
MCHC: 32.4 g/dL (ref 30.0–36.0)
MCV: 96.5 fL (ref 78.0–100.0)
MCV: 97.8 fL (ref 78.0–100.0)
PLATELETS: 129 10*3/uL — AB (ref 150–400)
Platelets: 143 10*3/uL — ABNORMAL LOW (ref 150–400)
RBC: 2.57 MIL/uL — AB (ref 3.87–5.11)
RBC: 2.3 MIL/uL — ABNORMAL LOW (ref 3.87–5.11)
RDW: 18.9 % — ABNORMAL HIGH (ref 11.5–15.5)
RDW: 19.5 % — AB (ref 11.5–15.5)
WBC: 39.7 10*3/uL — ABNORMAL HIGH (ref 4.0–10.5)
WBC: 37.5 10*3/uL — ABNORMAL HIGH (ref 4.0–10.5)

## 2018-02-03 LAB — PROTIME-INR
INR: 1.83
Prothrombin Time: 21 seconds — ABNORMAL HIGH (ref 11.4–15.2)

## 2018-02-03 LAB — HEMOGLOBIN AND HEMATOCRIT, BLOOD
HEMATOCRIT: 27 % — AB (ref 36.0–46.0)
Hemoglobin: 8.9 g/dL — ABNORMAL LOW (ref 12.0–15.0)

## 2018-02-03 LAB — MAGNESIUM: MAGNESIUM: 1.9 mg/dL (ref 1.7–2.4)

## 2018-02-03 LAB — PHOSPHORUS: Phosphorus: 4.4 mg/dL (ref 2.5–4.6)

## 2018-02-03 LAB — PREPARE RBC (CROSSMATCH)

## 2018-02-03 MED ORDER — TRACE MINERALS CR-CU-MN-SE-ZN 10-1000-500-60 MCG/ML IV SOLN
INTRAVENOUS | Status: AC
  Administered 2018-02-03: 20:00:00 via INTRAVENOUS
  Filled 2018-02-03: qty 597.6

## 2018-02-03 MED ORDER — SODIUM CHLORIDE 0.9 % IV SOLN
Freq: Once | INTRAVENOUS | Status: AC
  Administered 2018-02-03: 21:00:00 via INTRAVENOUS

## 2018-02-03 MED ORDER — CHLORHEXIDINE GLUCONATE 0.12 % MT SOLN
15.0000 mL | Freq: Two times a day (BID) | OROMUCOSAL | Status: DC
  Administered 2018-02-04 – 2018-02-05 (×4): 15 mL via OROMUCOSAL
  Filled 2018-02-03 (×2): qty 15

## 2018-02-03 MED ORDER — FUROSEMIDE 10 MG/ML IJ SOLN
80.0000 mg | Freq: Two times a day (BID) | INTRAMUSCULAR | Status: DC
  Administered 2018-02-03 – 2018-02-04 (×2): 80 mg via INTRAVENOUS
  Filled 2018-02-03 (×2): qty 8

## 2018-02-03 MED ORDER — ORAL CARE MOUTH RINSE
15.0000 mL | Freq: Two times a day (BID) | OROMUCOSAL | Status: DC
  Administered 2018-02-03 – 2018-02-04 (×3): 15 mL via OROMUCOSAL

## 2018-02-03 MED ORDER — ORAL CARE MOUTH RINSE
15.0000 mL | Freq: Two times a day (BID) | OROMUCOSAL | Status: DC
  Administered 2018-02-04 – 2018-02-05 (×3): 15 mL via OROMUCOSAL

## 2018-02-03 MED ORDER — FUROSEMIDE 10 MG/ML IJ SOLN
60.0000 mg | Freq: Two times a day (BID) | INTRAMUSCULAR | Status: DC
  Administered 2018-02-03: 60 mg via INTRAVENOUS
  Filled 2018-02-03: qty 6

## 2018-02-03 MED ORDER — FENTANYL CITRATE (PF) 100 MCG/2ML IJ SOLN
25.0000 ug | INTRAMUSCULAR | Status: DC | PRN
  Administered 2018-02-03 – 2018-02-04 (×2): 50 ug via INTRAVENOUS
  Filled 2018-02-03 (×2): qty 2

## 2018-02-03 MED ORDER — CHLORHEXIDINE GLUCONATE 0.12 % MT SOLN
15.0000 mL | Freq: Two times a day (BID) | OROMUCOSAL | Status: DC
  Administered 2018-02-03 – 2018-02-04 (×3): 15 mL via OROMUCOSAL
  Filled 2018-02-03: qty 15

## 2018-02-03 NOTE — Progress Notes (Signed)
Spoke with primary RN about PICC exchange and due to change in patients condition and is now receiving blood the PICC exchange will need to be done at a later time. We will follow up .

## 2018-02-03 NOTE — Progress Notes (Addendum)
Progress Note  Patient Name: [name] Chung Date of Encounter: 02/03/2018  Primary Cardiologist: Peter Martinique, MD  Subjective   Extubated today, sitting up in bed. In good spirits.   Inpatient Medications    Scheduled Meds: . chlorhexidine  15 mL Mouth Rinse BID  . furosemide  80 mg Intravenous Q12H  . insulin aspart  0-15 Units Subcutaneous Q4H  . mouth rinse  15 mL Mouth Rinse q12n4p  . metoprolol tartrate  2.5 mg Intravenous Q8H  . sodium chloride flush  10-40 mL Intracatheter Q12H  . sodium chloride flush  5 mL Intracatheter Q8H   Continuous Infusions: . sodium chloride 10 mL/hr at 02/03/18 0400  . amiodarone 30 mg/hr (02/03/18 1243)  . heparin 1,950 Units/hr (02/03/18 1449)  . piperacillin-tazobactam (ZOSYN)  IV Stopped (02/03/18 1233)  . TPN ADULT (ION) 65 mL/hr at 02/03/18 0400  . TPN ADULT (ION)     PRN Meds: bisacodyl, fentaNYL (SUBLIMAZE) injection, hydrALAZINE, ondansetron (ZOFRAN) IV, sodium chloride flush   Vital Signs    Vitals:   02/03/18 1146 02/03/18 1200 02/03/18 1300 02/03/18 1400  BP:  (!) 130/45 116/81 (!) 147/73  Pulse:  (!) 106 (!) 101 (!) 105  Resp:  (!) 22 (!) 23 20  Temp: 98.1 F (36.7 C)     TempSrc: Axillary     SpO2:  96% 97% 96%  Weight:      Height:        Intake/Output Summary (Last 24 hours) at 02/03/2018 1456 Last data filed at 02/03/2018 1400 Gross per 24 hour  Intake 2757.93 ml  Output 1515 ml  Net 1242.93 ml   Filed Weights   01/28/2018 2019 02/02/18 0550 02/03/18 0200  Weight: 163 lb 9.3 oz (74.2 kg) 169 lb 1.5 oz (76.7 kg) 171 lb 8.3 oz (77.8 kg)    Telemetry    Afib Rate low 100s - Personally Reviewed  ECG    N/a - Personally Reviewed  Physical Exam   General: Frail older W female appearing in no acute distress. Head: Normocephalic, atraumatic.  Neck: Supple, + JVD. Lungs:  Resp regular and unlabored, course anterior BS with crackles. Heart: Irreg Irreg, S1, S2, mechanical valve clicks no rub. Abdomen:  Distended with wound vac in place, dressing is dry. JP drain noted as well.  Extremities: Diffuse anasarca, some erythema noted to extremities.  Neuro: Alert and oriented X 3. Moves all extremities spontaneously. Psych: Normal affect.  Labs    Chemistry Recent Labs  Lab 01/31/18 0417  01/31/2018 1710 02/02/18 0453 02/03/18 0315  NA 135   < > 144 142 143  K 5.0   < > 3.4* 3.6 4.5  CL 101   < > 107 105 111  CO2 30   < > 28 30 27   GLUCOSE 191*   < > 179* 272* 152*  BUN 43*   < > 45* 50* 72*  CREATININE 1.07*   < > 0.91 0.98 1.49*  CALCIUM 8.1*   < > 8.3* 8.2* 7.8*  PROT 6.5  --  4.5* 4.4*  --   ALBUMIN 1.8*  --  1.8* 1.7*  --   AST 42*  --  30 29  --   ALT 12*  --  12* 12*  --   ALKPHOS 69  --  43 45  --   BILITOT 1.8*  --  3.2* 2.5*  --   GFRNONAA 47*   < > 57* 52* 31*  GFRAA 54*   < > >  60 >60 36*  ANIONGAP 4*   < > 9 7 5    < > = values in this interval not displayed.     Hematology Recent Labs  Lab 01/17/2018 1710 02/02/18 0453 02/03/18 0315  WBC 32.0* 37.2* 39.7*  RBC 2.72* 2.82* 2.57*  HGB 8.6* 8.8* 8.3*  HCT 26.2* 26.8* 24.8*  MCV 96.3 95.0 96.5  MCH 31.6 31.2 32.3  MCHC 32.8 32.8 33.5  RDW 17.1* 18.1* 18.9*  PLT 160 178 143*    Cardiac EnzymesNo results for input(s): TROPONINI in the last 168 hours. No results for input(s): TROPIPOC in the last 168 hours.   BNPNo results for input(s): BNP, PROBNP in the last 168 hours.   DDimer No results for input(s): DDIMER in the last 168 hours.    Radiology    Dg Chest Port 1 View  Result Date: 02/03/2018 CLINICAL DATA:  84 year old female perforated appendicitis, small bowel obstruction related to right lower quadrant inflammatory mass. Postoperative day 2 exploratory laparotomy, small-bowel resection, right salpingectomy, drainage of abdominal abscess. EXAM: PORTABLE CHEST 1 VIEW COMPARISON:  02/02/2018 and earlier. FINDINGS: Portable AP semi upright view at 0340 hours. Intubated. Endotracheal tube tip between the  clavicles and carina. Enteric tube courses to the abdomen as before, tip not included. Stable right PICC line. Continued veiling opacity in both lower lungs. Superimposed patchy bilateral perihilar opacity. Stable lung volumes in ventilation since yesterday. No pneumothorax. Paucity bowel gas in the upper abdomen. IMPRESSION: 1.  Stable lines and tubes. 2. Stable ventilation since yesterday with bilateral pleural effusions, lower lobe collapse or consolidation, and additional nonspecific perihilar patchy opacity. Bilateral pneumonia is possible. Electronically Signed   By: Genevie Ann M.D.   On: 02/03/2018 08:05   Dg Chest Port 1 View  Result Date: 02/02/2018 CLINICAL DATA:  Respiratory failure EXAM: PORTABLE CHEST 1 VIEW COMPARISON:  01/11/2018 FINDINGS: Endotracheal tip approximately 7.5 cm above the carina. Left jugular central venous catheter tip SVC. Right arm PICC tip in the SVC. NG enters the stomach with the tip not visualized Diffuse bilateral airspace disease unchanged. Bilateral pleural effusions and bibasilar airspace disease right greater than left also unchanged. IMPRESSION: Endotracheal tube 7.5 cm above the carina Diffuse bilateral airspace disease and bilateral effusions unchanged from yesterday. Electronically Signed   By: Franchot Gallo M.D.   On: 02/02/2018 07:34   Dg Chest Port 1 View  Result Date: 01/31/2018 CLINICAL DATA:  Status post abdominal surgery, respiratory failure. EXAM: PORTABLE CHEST 1 VIEW COMPARISON:  Radiograph of same day. FINDINGS: Stable cardiomegaly. Endotracheal and nasogastric tubes are unchanged in position. No pneumothorax is noted. Right-sided PICC line is unchanged in position. Left internal jugular catheter is noted with distal tip in expected position of the SVC. Central pulmonary vascular congestion is noted. Bilateral perihilar opacities are noted concerning for pneumonia or edema. Stable bilateral pleural effusions are noted, right greater than left with  associated atelectasis or edema. Bony thorax is unremarkable. IMPRESSION: Stable support apparatus. Stable bilateral lung opacities are noted concerning for edema with associated pleural effusions. Aortic Atherosclerosis (ICD10-I70.0). Electronically Signed   By: Marijo Conception, M.D.   On: 01/31/2018 21:57   Dg Chest Port 1 View  Result Date: 01/22/2018 CLINICAL DATA:  84 year old female post central line placement. Subsequent encounter. EXAM: PORTABLE CHEST 1 VIEW COMPARISON:  01/27/2018 8:15 a.m. FINDINGS: Endotracheal tube tip 6.2 cm above the carina. Left central line has been placed from left jugular position. Tip is at the level of the  proximal superior vena cava directed laterally. This could impinge upon the lateral wall of the superior vena cava with injection. Right PICC line tip mid superior vena cava level. Nasogastric tube courses below the diaphragm. Tip is not included on the present exam. Post median sternotomy and valve replacement.  Cardiomegaly. Asymmetric airspace disease with right-sided pleural effusion. Pulmonary edema may contribute to this appearance although infectious infiltrate is also consideration in the proper clinical setting. Right base atelectasis. No pneumothorax. Calcified tortuous aorta. IMPRESSION: Since the prior examination, endotracheal tube has been placed with tip 6.2 cm above the carina. Left central line has been placed from left jugular position. Tip is at the level of the proximal superior vena cava directed laterally. This could impinge upon the lateral wall of the superior vena cava with injection. Remainder of findings without significant changes detailed above. These results will be called to the ordering clinician or representative by the Radiologist Assistant, and communication documented in the PACS or zVision Dashboard. Electronically Signed   By: Genia Del M.D.   On: 01/24/2018 16:19    Cardiac Studies   N/a   Patient Profile     84 y.o.  female with a history ofrheumatic valvular heart disease status post redo aortic valve replacement x3 and redo mitral valve surgery x2, persistent atrial fibrillation, coumadin for anticoagulation, chronic LE edema (multifactorial with diastolic CHF, venous insufficiency, cirrhosis)now admitted withperforated appendix withSBO. We are assisting with management of diastolic heart failure and atrial fibrillation.    Assessment & Plan    1. Rheumatic heart disease s/p AVR x3 and MVR x2: Has a St. Jude mechanical mitral valve and Medtronic freestyle aortic root conduit.  -- coumadin has been held with heparin bridge prior to surgery. Underwent SB resection, exp lap and appendectomy with Dr. Georgette Dover. Cleared to resume heparin without bolus yesterday. INR 1.83  2. Abd pain 2/2 to perforated appendicitis with abscess: now s/p SB resection, exp lap, and appendectomy with Dr. Georgette Dover. Required vasopressin and levophed, along with mechanical ventilation. PCCM managing. She has been extubated today and doing well on RA.   3. Chronic Afib: rates in the low 100s on amiodarone drip and IV lopressor.  -- dig held while NPO   4. Acute on Chronic diastolic ER:[id number] volume overload with diffuse anasarca. Albumin 1.7 yesterday. Little UOP noted yesterday and weight continues to trend up. Cr now up from 0.98>>1.49 today. Consider albumin with lasix as she has significant third spacing in the setting of hypoalbuminemia. -- remains on IV lasix 80mg  BID.    5.Anemia: s/p PRBCs and plasma. Hgb 8.8>> 8.3.   Signed, Reino Bellis, NP  02/03/2018, 2:56 PM  Pager # [phone]   Patient seen, examined. Available data reviewed. Agree with findings, assessment, and plan as outlined by Reino Bellis, NP-C. On my exam, she is uncomfortable secondary to abdominal pain. Exam pertinent for JVD, diffuse rales, heart irregular, tachy, normal mechanical heart sounds, 2/6 systolic murmur at the LSB, and there is diffuse  edema/anasarca.  Agree with plans as outlined above. Continue IV amiodarone. Continue IV lasix. At high risk of further AKI after complex surgery with extensive transfusion required. Will follow with you.  Sherren Mocha, M.D. 02/03/2018 3:32 PM   For questions or updates, please contact [place] Please consult [email or web address] for contact info under Cardiology/STEMI.

## 2018-02-03 NOTE — Procedures (Signed)
Extubation Procedure Note  Patient Details:   Name: [name] Chung DOB: 23-Sep-1933 MRN: [id number]   Airway Documentation:  Airway 7 mm (Active)  Secured at (cm) 25 cm 02/03/2018  8:28 AM  Measured From Lips 02/03/2018  8:28 AM  [place] 02/03/2018  8:28 AM  Secured By [employer] 02/03/2018  8:28 AM  Tube Holder Repositioned Yes 02/03/2018  8:28 AM  Cuff Pressure (cm H2O) 28 cm H2O 02/03/2018  8:28 AM  Site Condition Dry 02/03/2018  8:28 AM   Vent end date: (not recorded) Vent end time: (not recorded)   Evaluation  O2 sats: stable throughout Complications: No apparent complications Patient did tolerate procedure well. Bilateral Breath Sounds: Clear   Yes  Extubated patient to 4L nasal canula. Patient oriented to time.  [name] 02/03/2018, 9:14 AM

## 2018-02-03 NOTE — Addendum Note (Signed)
Addendum  created 02/03/18 1752 by Roberts Gaudy, MD   Sign clinical note

## 2018-02-03 NOTE — Progress Notes (Addendum)
PHARMACY - ADULT TOTAL PARENTERAL NUTRITION CONSULT NOTE   Pharmacy Consult:  TPN Indication:  Prolonged ileus  Patient Measurements: Height: 5\' 6"  (167.6 cm) Weight: 171 lb 8.3 oz (77.8 kg) IBW/kg (Calculated) : 59.3 TPN AdjBW (KG): 63 Body mass index is 27.68 kg/m.  Assessment:  69 YOF presented on 01/05/2018 with N/V/D and abdominal pain, found to have pSBO.  She was placed on a clear liquid diet on 01/21/18.  01/22/18 CT concerning for perforated appendicitis and patient may need surgery; therefore, she was made NPO again.  Pharmacy consulted to manage TPN for ileus and prolonged NPO status.  Patient transferred to the ICU on 01/04/2018 s/p s/p ex-lap with LoA, SBR, appendectomy, drainage of abd abscess.  Continue TPN with lipids given that patient has been on TPN since 01/23/18 and not a new start.  GI: cirrhosis.  Prealbumin remains <5, LBM 4/27, abscess drain O/P 418mL, NG O/P 187mL.  Pepcid IV, PRN Zofran Endo: no hx DM - CBGs better controlled Insulin requirements in the past 24 hours: 20 units SSI + 10 units in TPN Lytes: Mag 1.9 (goal >/= 2), Phos high normal, others WNL Renal: SCr 0.98 >> 1.49, BUN up to 72 - UOP 0.2 ml/kg/hr, net 954mL since admit, anasarca Pulm: intubated post-op, FiO2 40% Cards: Rheumatic heart dz - hypotensive, HR improving (Afib), CVP 15.  IV Lasix incr 4/29, IV metoprolol, amiodarone started 5/1, off digoxin AC: Coumadin PTA for Afib / mech valves >> Heparin, s/p Vit K - hgb low, plts down to 143 Hepatobil: LFTs / TG WNL, tbili mildly elevated at 2.5 Neuro: PRN Fentanyl - GCS 13, CPOT 2, RASS -1 ID: Zosyn for appendiceal abscess/enteritis, cx grew Actinomyces and Bacteroides - Tmax 100.8, WBC up to 39.7 TPN Access: PICC placed 01/23/18 TPN start date: 01/23/18  Nutritional Goals (per RD 5/1): 1300-1400 kCal, 81-93 g protein per day    Current Nutrition:  TPN   Plan:  Concentrate TPN - TPN at 45 ml/hr will provide 90g AA, 205g CHO and 29g ILE for a total  of 1348 kCal, meeting 100% of patient's needs. Electrolytes in TPN: reduce Phos, increase mag slightly, max CL (Cl:Ac 1.7:1) - lytes fluctuation d/t reduced TPN rate Daily multivitamin and trace elements in TPN Continue moderate SSI Q4H.   Remove insulin due to reduced CHO provision in TPN. D/C Pepcid bolus and add 40mg  to TPN F/U AM labs (watch BUN)   Monico Sudduth D. Mina Marble, PharmD, BCPS, BCCCP Pager:  [phone] 02/03/2018, 8:12 AM

## 2018-02-03 NOTE — Evaluation (Signed)
Occupational Therapy Re-Evaluation Patient Details Name: [name] Chung MRN: [id number] DOB: 04-12-1933 Today's Date: 02/03/2018    History of Present Illness 84 yo female admitted 01/06/2018 with nausea, vomiting, abdominal pain, diarrhea, abdominal distension. CT positive for partial SBO, also diagnosed with sepsis, abdominal drain placed.  Underwent ex lap, appendectomy, SB resection, R salpingectomy drain and abdominal wound vac placed. Pt with post surgical VDRF, extubated 02/03/18. PMH OA, aortic insufficiency, A-fib, rheumatic heart disease, hx aortic valve replacement, hx mitral valve replacement    Clinical Impression   Pt is re-evaluated today following abdominal surgery. Pt requires 2 person assist for bed mobility, but was able to sit EOB x 5 minutes with min guard assist and do some coughing. 02 sats dropped to 88% on RA, replace 02 via [place]. Pt currently is NPO and requires total care. Will follow acutely. Recommending SNF, but pt may improve to tolerate more intensive rehab in CIR.    Follow Up Recommendations  SNF    Equipment Recommendations       Recommendations for Other Services       Precautions / Restrictions Precautions Precautions: Fall Precaution Comments: Painful/sensitive Left Ankle; LEE bilat, wound vac, JP drains x 2, NG tube Restrictions Weight Bearing Restrictions: No      Mobility Bed Mobility Overal bed mobility: Needs Assistance Bed Mobility: Supine to Sit;Sit to Supine     Supine to sit: +2 for physical assistance;Max assist Sit to supine: +2 for physical assistance;Total assist   General bed mobility comments: pt assisting with raising her trunk, total assist for LE in/out of bed and to guide trunk back to supine  Transfers                 General transfer comment: deferred this visit    Balance Overall balance assessment: Needs assistance   Sitting balance-Leahy Scale: Fair Sitting balance - Comments: min guard assist, sat x 5  minutes                                   ADL either performed or assessed with clinical judgement   ADL                                         General ADL Comments: pt currently NPO with total dependence in ADL     Vision Patient Visual Report: No change from baseline       Perception     Praxis      Pertinent Vitals/Pain Pain Assessment: Faces Faces Pain Scale: Hurts whole lot Pain Location: abdomen, LEs Pain Descriptors / Indicators: Grimacing;Guarding;Moaning Pain Intervention(s): Monitored during session;Premedicated before session;Repositioned     Hand Dominance Right   Extremity/Trunk Assessment Upper Extremity Assessment Upper Extremity Assessment: Generalized weakness(edematous)   Lower Extremity Assessment Lower Extremity Assessment: Defer to PT evaluation       Communication Communication Communication: No difficulties   Cognition Arousal/Alertness: Awake/alert Behavior During Therapy: WFL for tasks assessed/performed Overall Cognitive Status: Impaired/Different from baseline Area of Impairment: Following commands;Problem solving                       Following Commands: Follows one step commands inconsistently     Problem Solving: Decreased initiation;Difficulty sequencing;Requires verbal cues     General Comments  Exercises     Shoulder Instructions      Home Living Family/patient expects to be discharged to:: Skilled nursing facility Living Arrangements: Spouse/significant other(with dementia) Available Help at Discharge: Family;Personal care attendant;Available 24 hours/day Type of Home: Independent living facility Home Access: Level entry     Home Layout: One level     Bathroom Shower/Tub: Occupational psychologist: Standard     Home Equipment: Grab bars - tub/shower          Prior Functioning/Environment Level of Independence: Independent        Comments: at  baseline patient very independent, drives, able to care for herself        OT Problem List: Decreased strength;Decreased activity tolerance;Impaired balance (sitting and/or standing);Decreased cognition;Decreased safety awareness;Decreased knowledge of use of DME or AE;Pain      OT Treatment/Interventions: Self-care/ADL training;DME and/or AE instruction;Therapeutic activities;Patient/family education;Balance training;Cognitive remediation/compensation    OT Goals(Current goals can be found in the care plan section) Acute Rehab OT Goals Patient Stated Goal: to get well, return to PLOF  OT Goal Formulation: With patient/family Time For Goal Achievement: 02/17/18 Potential to Achieve Goals: Good  OT Frequency: Min 2X/week   Barriers to D/C:            Co-evaluation PT/OT/SLP Co-Evaluation/Treatment: Yes Reason for Co-Treatment: For patient/therapist safety;Complexity of the patient's impairments (multi-system involvement)   OT goals addressed during session: Strengthening/ROM      AM-PAC PT "6 Clicks" Daily Activity     Outcome Measure Help from another person eating meals?: Total Help from another person taking care of personal grooming?: Total Help from another person toileting, which includes using toliet, bedpan, or urinal?: Total Help from another person bathing (including washing, rinsing, drying)?: Total Help from another person to put on and taking off regular upper body clothing?: Total Help from another person to put on and taking off regular lower body clothing?: Total 6 Click Score: 6   End of Session    Activity Tolerance: Patient limited by fatigue Patient left: in bed;with call bell/phone within reach;with bed alarm set;with family/visitor present  OT Visit Diagnosis: Unsteadiness on feet (R26.81);Other abnormalities of gait and mobility (R26.89);Muscle weakness (generalized) (M62.81);Other symptoms and signs involving cognitive function;Pain                 Time: 1120-1141 OT Time Calculation (min): 21 min Charges:  OT General Charges $OT Visit: 1 Visit OT Evaluation $OT Re-eval: 1 Re-eval G-Codes:     02-23-18 Nestor Lewandowsky, OTR/L Pager: [phone]  Werner Lean, Haze Boyden 02-23-2018, 11:56 AM

## 2018-02-03 NOTE — Progress Notes (Signed)
25cc of fentanyl drip wasted in sink. Witnessed by [employer].

## 2018-02-03 NOTE — Progress Notes (Signed)
Pt has bleeding from femoral a-line site. Bleeding has persisted despite multiple pressure dressings. Heparin gtt paused, Dr Lake Bells to bedside. CBC sent. 5lb pressure bag in place. Current VS stable. Nursing to continue to monitor.

## 2018-02-03 NOTE — Progress Notes (Signed)
Daily Progress Note   Patient Name: [name] Chung       Date: 02/03/2018 DOB: 04-Sep-1933  Age: 84 y.o. MRN#: [id number] Attending Physician: Juanito Doom, MD Primary Care Physician: Lajean Manes, MD Admit Date: 01/31/2018  Reason for Consultation/Follow-up: Establishing goals of care  Subjective: Patient in bed, moaning. Extubated without complications. Complains of abdominal pain. Asks what she is supposed to do next. Extubated today. Requesting water. Explained need for swallow eval. Provided mouth care. Noted rising Cr, diffuse anasarc, continued rising WBC.   Review of Systems  Unable to perform ROS: Medical condition  Gastrointestinal: Positive for abdominal pain.    Length of Stay: 15  Current Medications: Scheduled Meds:  . chlorhexidine  15 mL Mouth Rinse BID  . furosemide  60 mg Intravenous Q12H  . insulin aspart  0-15 Units Subcutaneous Q4H  . mouth rinse  15 mL Mouth Rinse q12n4p  . metoprolol tartrate  2.5 mg Intravenous Q8H  . sodium chloride flush  10-40 mL Intracatheter Q12H  . sodium chloride flush  5 mL Intracatheter Q8H    Continuous Infusions: . sodium chloride 10 mL/hr at 02/03/18 0400  . amiodarone 30 mg/hr (02/03/18 0400)  . heparin 2,050 Units/hr (02/03/18 0438)  . piperacillin-tazobactam (ZOSYN)  IV 3.375 g (02/03/18 0833)  . TPN ADULT (ION) 65 mL/hr at 02/03/18 0400  . TPN ADULT (ION)      PRN Meds: bisacodyl, fentaNYL (SUBLIMAZE) injection, hydrALAZINE, ondansetron (ZOFRAN) IV, sodium chloride flush  Physical Exam  Constitutional: She appears well-developed. She appears distressed.  Cardiovascular:  Tachycardic, diffuse anasarca  Pulmonary/Chest: Effort normal. No respiratory distress.  Neurological: She is alert.  Lethargic, weak  Skin:  There is pallor.  Nursing note and vitals reviewed.           Vital Signs: BP (!) 128/46   Pulse 100   Temp 99 F (37.2 C) (Axillary)   Resp 18   Ht 5\' 6"  (1.676 m)   Wt 77.8 kg (171 lb 8.3 oz)   SpO2 97%   BMI 27.68 kg/m  SpO2: SpO2: 97 % O2 Device: O2 Device: Nasal Cannula O2 Flow Rate: O2 Flow Rate (L/min): 4 L/min  Intake/output summary:   Intake/Output Summary (Last 24 hours) at 02/03/2018 1143 Last data filed at 02/03/2018 1000 Gross per 24 hour  Intake 2857.93 ml  Output 1220 ml  Net 1637.93 ml   LBM: Last BM Date: 01/29/18 Baseline Weight: Weight: 51.7 kg (114 lb) Most recent weight: Weight: 77.8 kg (171 lb 8.3 oz)       Palliative Assessment/Data: PPS: 10%      Patient Active Problem List   Diagnosis Date Noted  . Palliative care by specialist   . Goals of care, counseling/discussion   . Advanced care planning/counseling discussion   . Ileus ([place])   . Acute on chronic diastolic heart failure ([place])   . Septic shock ([place])   . Perforated appendicitis   . Leukocytosis   . Partial small bowel obstruction ([place]) 01/21/2018  . Acute renal failure superimposed on stage 3 chronic kidney disease ([place]) 01/28/2018  . Atrial fibrillation with RVR ([place]) 01/16/2018  . Chronic systolic (congestive) heart failure ([place]) 01/06/2018  . Hyponatremia 01/18/2018  . Sepsis ([place]) 01/22/2018  . Liver cirrhosis ([place]) 01/03/2018  . Edema 03/06/2014  . Encounter for therapeutic drug monitoring 10/31/2013  . S/P MVR (mitral valve replacement) 09/14/2013  . S/P AVR (aortic valve replacement) 09/14/2013  . Heart valve replaced by other means 02/03/2011  . Long term current use of anticoagulant therapy 02/03/2011  . Aortic valve disorders 01/06/2011  . Elevated BP 01/06/2011  . Valvular heart disease   . Chronic atrial fibrillation ([place])   . Chronic anticoagulation   . Rheumatic heart disease   . Aortic insufficiency   . Arthritis     Palliative Care Assessment & Plan    Patient Profile:  84 y.o. female  with past medical history of NASH cirrhosis, CHF, Afib, CAD with artificial valve replacements on coumadin, admitted on 01/28/2018 with progressing nausea, and abdominal pain. Workup revealed UTI, SBO, appendicitis, and abdominal abscess. During admission- drain was placed by IR, patient was started on TPN, however, conservative measures failed and patient was taken for exploratory lap on on 4/30. She was found to have abdominal adhesions, ischemic small bowel, pyosalpinx, and perforated appendix. She returned to ICU on IV pressors and intubated after surgery. Palliative medicine was consulted prior to surgery- but family had chosen to go to surgery before palliative meeting was set. Meeting 5/1 for continued [place] and support.    Assessment/Recommendations/Plan   Continue full scope care  Pain medicine PRN  SLP eval for possible ice chips  PMT will continue to follow and discuss ongoing [place]  Goals of Care and Additional Recommendations:  Limitations on Scope of Treatment: Full Scope Treatment  Code Status:  Full code  Prognosis:   Unable to determine  Discharge Planning:  To Be Determined  Care plan was discussed with patient's RN.  Thank you for allowing the Palliative Medicine Team to assist in the care of this patient.   Time In: 1030 Time Out: 1055 Total Time 25 ins Prolonged Time Billed no      Greater than 50%  of this time was spent counseling and coordinating care related to the above assessment and plan.  Mariana Kaufman, AGNP-C Palliative Medicine   Please contact Palliative Medicine Team phone at [phone] for questions and concerns.

## 2018-02-03 NOTE — Progress Notes (Addendum)
PULMONARY / CRITICAL CARE MEDICINE   Name: [name] Chung MRN: [id number] DOB: 03/31/1933    ADMISSION DATE:  01/18/2018 CONSULTATION DATE: 01/25/2018   REFERRING MD: Georgette Dover   CHIEF COMPLAINT: abdominal pain  BRIEF SUMMARY:   84 yr old F with PMH Afib, mechanical atrial / mitral valves on coumadin, CHF who was admitted on 4/17 with abd pain, nausea and vomiting and was found to have small bowel obstruction.  Also found to have UTI. CT abdomen repeated and was showing concern for appendiceal abscess.   A drain was inserted via IR and conservative managemnet was recommended.  Repeated CT scan was showing no resolution of the abscess and her WBC continued to go up in addition to her abdominal pain so she was taken to the OR 4/30 for adhesion lysis, small bowel resection, appendectomy and abscess drainage. Patient required 3 pressors in the OR, returned to ICU intubated.   SUBJECTIVE:  RT reports pt weaning, attempting to follow commands.  Tmax 100.8.  WBC increased to 39.7.  I/O - 1.6L positive last 24 hours / 994+ for admit.  RN having difficulty with compatibilities of IV fluids.     VITAL SIGNS: BP (!) 87/67   Pulse 94   Temp 99 F (37.2 C) (Axillary)   Resp (!) 24   Ht 5\' 6"  (1.676 m)   Wt 171 lb 8.3 oz (77.8 kg)   SpO2 98%   BMI 27.68 kg/m   HEMODYNAMICS: CVP:  [15 mmHg] 15 mmHg  VENTILATOR SETTINGS: Vent Mode: CPAP;PSV FiO2 (%):  [40 %] 40 % Set Rate:  [16 bmp] 16 bmp Vt Set:  [470 mL] 470 mL PEEP:  [5 cmH20] 5 cmH20 Pressure Support:  [5 cmH20] 5 cmH20 Plateau Pressure:  [17 cmH20-19 cmH20] 19 cmH20  INTAKE / OUTPUT: I/O last 3 completed shifts: In: 4162.2 [I.V.:3862.2; NG/GT:50; IV Piggyback:250] Out: 3151 [Urine:975; Emesis/NG output:100; Drains:750]  PHYSICAL EXAMINATION: General:  Frail elderly female in NAD on vent HEENT: MM pink/moist, ETT Neuro: Awake, alert, follows commands but weak CV: s1s2 irregularly irregular, valvular click PULM: even/non-labored,  lungs bilaterally clear anterior, crackles/diminished lower GI:  Soft, midline VAC, JP drains with serosanguinous fluid Extremities: warm/dry, 2-3+ pitting edema  Skin: no rashes or lesions  LABS:  BMET Recent Labs  Lab 01/30/2018 1710 02/02/18 0453 02/03/18 0315  NA 144 142 143  K 3.4* 3.6 4.5  CL 107 105 111  CO2 28 30 27   BUN 45* 50* 72*  CREATININE 0.91 0.98 1.49*  GLUCOSE 179* 272* 152*    Electrolytes Recent Labs  Lab 01/31/18 0417  01/15/2018 1710 02/02/18 0453 02/03/18 0315  CALCIUM 8.1*   < > 8.3* 8.2* 7.8*  MG 2.2  --   --   --  1.9  PHOS 4.0  --   --   --  4.4   < > = values in this interval not displayed.    CBC Recent Labs  Lab 01/20/2018 1710 02/02/18 0453 02/03/18 0315  WBC 32.0* 37.2* 39.7*  HGB 8.6* 8.8* 8.3*  HCT 26.2* 26.8* 24.8*  PLT 160 178 143*    Coag's Recent Labs  Lab 01/10/2018 1710 02/02/18 0453 02/03/18 0315  INR 1.73 1.66 1.83    Sepsis Markers Recent Labs  Lab 01/28/18 1721 01/29/18 0438 01/30/18 0512 01/11/2018 2114  LATICACIDVEN  --   --   --  1.6  PROCALCITON 0.60 0.54 0.62  --     ABG Recent Labs  Lab 01/16/2018 1312 01/17/2018  1428 01/09/2018 2210  PHART 7.444 7.392 7.360  PCO2ART 42.6 43.1 51.2*  PO2ART 122.0* 153.0* 224*    Liver Enzymes Recent Labs  Lab 01/31/18 0417 01/15/2018 1710 02/02/18 0453  AST 42* 30 29  ALT 12* 12* 12*  ALKPHOS 69 43 45  BILITOT 1.8* 3.2* 2.5*  ALBUMIN 1.8* 1.8* 1.7*    Cardiac Enzymes No results for input(s): TROPONINI, PROBNP in the last 168 hours.  Glucose Recent Labs  Lab 02/02/18 1116 02/02/18 1518 02/02/18 1926 02/02/18 2337 02/03/18 0324 02/03/18 0751  GLUCAP 177* 164* 161* 139* 142* 150*    Imaging Dg Chest Port 1 View  Result Date: 02/03/2018 CLINICAL DATA:  84 year old female perforated appendicitis, small bowel obstruction related to right lower quadrant inflammatory mass. Postoperative day 2 exploratory laparotomy, small-bowel resection, right  salpingectomy, drainage of abdominal abscess. EXAM: PORTABLE CHEST 1 VIEW COMPARISON:  02/02/2018 and earlier. FINDINGS: Portable AP semi upright view at 0340 hours. Intubated. Endotracheal tube tip between the clavicles and carina. Enteric tube courses to the abdomen as before, tip not included. Stable right PICC line. Continued veiling opacity in both lower lungs. Superimposed patchy bilateral perihilar opacity. Stable lung volumes in ventilation since yesterday. No pneumothorax. Paucity bowel gas in the upper abdomen. IMPRESSION: 1.  Stable lines and tubes. 2. Stable ventilation since yesterday with bilateral pleural effusions, lower lobe collapse or consolidation, and additional nonspecific perihilar patchy opacity. Bilateral pneumonia is possible. Electronically Signed   By: Genevie Ann M.D.   On: 02/03/2018 08:05   STUDIES: CT ABD/Pelvis 4/328 >> small bowel dilation concerning for SBO, thickening of duodenum concerning for enteritis, expansion of L obturator muscle, large R / moderate L pleural effusions, cholelithiasis, anasarca, cirrhosis / portal venous hypertension  CULTURES: BCx2 4/17 >> negative BCx2 4/18 >> negative  Abdominal Abscess 4/24 >> abundant bacteroides fragilis, beta lactam positive  ANTIBIOTICS: Zosyn 4/17 >>  SIGNIFICANT EVENTS: 4/30  Exploratory laparotomy, lysis of adhesions, small bowel resection, appendectomy, right salpingectomy, drainage of abdominal abscess  LINES/TUBES: Right PICC 4/21 >> Left CVC IJ 4/30 >>   DISCUSSION: 84 y/o F with multiple comorbidities including Afib, mechanical atrial and mitral valves on coumadin, CHF with perforated appendix and abdominal abscess collection s/p small bowel resection, appendectomy and abdominal drainage 4/30.  Returned to ICU on vent / pressors.   ASSESSMENT / PLAN:  PULMONARY A: Acute hypoxemic respiratory failure   Acute pulm edema  Bilateral pleural effusion  P:   Now extubation  Pulmonary hygiene post  extubation  PRN BiPAP for increased WOB / respiratory fatigue Follow CXR intermittently   CARDIOVASCULAR A:  Septic Shock - in setting of SBO, abdominal abscess Afib with RVR Acute diastolic CHF Acute pulm edema Rheumatic Heart Disease s/p Mechanical Aortic and Mitral valve - on anticoagulation   P:  Continue heparin gtt  Amiodarone gtt  Lasix to 80 mg IV Q12    TED hose to BLE Hold PO digoxin, metolazone  IV Lopressor 2.5mg  Q8  Appreciate Cardiology   RENAL A:   Hyperkalemia  Rising Sr Cr - 5/2  P:   Trend BMP / urinary output Replace electrolytes as indicated Avoid nephrotoxic agents, ensure adequate renal perfusion Lasix as above   GASTROINTESTINAL A:   Abdominal abscess Perforated appendix s/p appendectomy, abscess drainage and small bowel resection Liver cirrhosis   P:   NPO  TPN per pharmacy  Hold PO meds until cleared by surgery  Pepcid IV for SUP  Hold PO lactulose, rifaximin  HEMATOLOGIC A:   Anemia  P:  Trend CBC  Monitor for bleeding  Heparin gtt as above  INFECTIOUS A:   Abdominal abscess s/p drainage  P:   Zosyn, stop date in place  Monitor fever curve / WBC trend  Discontinue femoral aline Discontinue L IJ TLC once PICC line exchanged   ENDOCRINE A:   Hyperglycemia P:   SSI   NEUROLOGIC A:   Acute metabolic encephalopathy P:   RASS goal: n/a  Discontinue sedation protocol  PRN fentanyl for pain (low dose)  PT / OT efforts    FAMILY  - Updates:  Step-daughter Lorriane Shire) updated at bedside.    CC Time: 30 minutes   Noe Gens, NP-C [facility] Pgr: [phone] or if no answer [phone] 02/03/2018, 8:44 AM

## 2018-02-03 NOTE — Progress Notes (Signed)
Anesthesiology Follow-up:  84 year old female 2 days S/P exploratory lap, appendectomy, and small bowel resection. Sleepy, answers questions appropriately, extubated today, hemodynamically stable off pressors. Permenent Afib HR 100-110 on amiodarone and IV metoprolol. Femoral art line removed today, persistent bleeding noted, sandbag applied. 1 unit PRBCs given.  Roberts Gaudy

## 2018-02-03 NOTE — Progress Notes (Signed)
ANTICOAGULATION CONSULT NOTE - Follow Up Consult  Pharmacy Consult for Heparin Indication: atrial fibrillation and mechanical AVR & MVR  Patient Measurements: Height: 5\' 6"  (167.6 cm) Weight: 171 lb 8.3 oz (77.8 kg) IBW/kg (Calculated) : 59.3 Heparin Dosing Weight: 58 kg  Vital Signs: Temp: 99 F (37.2 C) (05/02 0354) Temp Source: Axillary (05/02 0354) BP: 110/47 (05/02 0300) Pulse Rate: 101 (05/02 0330)  Labs: Recent Labs    01/28/2018 0500  01/10/2018 1710 02/02/18 0453 02/02/18 1850 02/03/18 0315  HGB 8.0*   < > 8.6* 8.8*  --  8.3*  HCT 24.9*   < > 26.2* 26.8*  --  24.8*  PLT 184  --  160 178  --  143*  LABPROT 20.2*  --  20.1* 19.5*  --  21.0*  INR 1.74  --  1.73 1.66  --  1.83  HEPARINUNFRC 0.44  --   --   --  0.25* 0.64  CREATININE 1.01*  --  0.91 0.98  --   --    < > = values in this interval not displayed.    Estimated Creatinine Clearance: 45.8 mL/min (by C-G formula based on SCr of 0.98 mg/dL).  Assessment:  Anticoag: warfarin PTA for afib and mechanical valve. INR 1.66, Resumed heparin 5/1 am.   Heparin level 0.64 after rate increase. No overt bleeding for > 24 hours, could consider changing heparin goal back to 0.3-0.7.   Goal of Therapy:  Heparin level 0.3-0.5 units/ml Monitor platelets by anticoagulation protocol: Yes   Plan:  Decrease heparin drip to 2,050 units/hr Daily heparin level and CBC  Georga Bora, PharmD Clinical Pharmacist 02/03/2018 4:23 AM

## 2018-02-03 NOTE — Progress Notes (Signed)
ANTICOAGULATION CONSULT NOTE - Follow Up Consult  Pharmacy Consult for Heparin Indication: atrial fibrillation and mechanical AVR & MVR  Patient Measurements: Height: 5\' 6"  (167.6 cm) Weight: 171 lb 8.3 oz (77.8 kg) IBW/kg (Calculated) : 59.3 Heparin Dosing Weight: 58 kg  Vital Signs: Temp: 98.1 F (36.7 C) (05/02 1146) Temp Source: Axillary (05/02 1146) BP: 130/45 (05/02 1200) Pulse Rate: 106 (05/02 1200)  Labs: Recent Labs    01/22/2018 1710 02/02/18 0453 02/02/18 1850 02/03/18 0315 02/03/18 1159  HGB 8.6* 8.8*  --  8.3*  --   HCT 26.2* 26.8*  --  24.8*  --   PLT 160 178  --  143*  --   LABPROT 20.1* 19.5*  --  21.0*  --   INR 1.73 1.66  --  1.83  --   HEPARINUNFRC  --   --  0.25* 0.64 0.82*  CREATININE 0.91 0.98  --  1.49*  --     Estimated Creatinine Clearance: 30.1 mL/min (A) (by C-G formula based on SCr of 1.49 mg/dL (H)).  Assessment:  Anticoag: warfarin PTA for afib and mechanical valve. INR 1.66, Resumed heparin 5/1 am.   Heparin level is supratherapeutic despite rate decrease. H/H low stable. Plt down to 143k   Goal of Therapy:  Heparin level 0.3-0.5 units/ml Monitor platelets by anticoagulation protocol: Yes   Plan:  Decrease heparin drip to 1950 units/hr F/u 8 hr HL  Daily heparin level and CBC  Albertina Parr, PharmD., BCPS Clinical Pharmacist Clinical phone for 02/03/18 until 3:30pm: [phone] If after 3:30pm, please call main pharmacy at: [phone]

## 2018-02-03 NOTE — Consult Note (Signed)
[facility] Nurse wound consult note Reason for Consult: VAC change on 02/04/18 Wound type: Midline abdominal  I have requested a large black foam dressing for change tomorrow 02/04/18

## 2018-02-03 NOTE — Evaluation (Signed)
Physical Therapy Evaluation Patient Details Name: [name] Chung MRN: [id number] DOB: January 07, 1933 Today's Date: 02/03/2018   History of Present Illness  Pt is an 84 y.o. female admitted 01/04/2018 with nausea, vomiting, abdominal pain, diarrhea, abdominal distension. CT positive for partial SBO, also diagnosed with sepsis, abdominal drain placed.  Underwent ex lap, appendectomy, SB resection, R salpingectomy drain and abdominal wound vac placed. Pt with post surgical VDRF, extubated 02/03/18. PMH OA, aortic insufficiency, A-fib, rheumatic heart disease, hx aortic valve replacement, hx mitral valve replacement.   Clinical Impression  Pt seen for re-evaluation s/p abdominal surgery. Very limited by pain this session. MaxA+2 for bed mobility; pt able to tolerate sitting EOB with min guard for balance, eventually requesting to return to supine secondary to pain. Difficulty clearing secretions; educ on bracing with pillow for coughs. SpO2 down to 88% on RA; [place] O2 replaced. Currently recommending SNF; pending pt progression, may be able to tolerate more intensive CIR-level therapies. Will continue to follow acutely.    Follow Up Recommendations SNF;Supervision/Assistance - 24 hour    Equipment Recommendations  None recommended by PT    Recommendations for Other Services       Precautions / Restrictions Precautions Precautions: Fall Precaution Comments: Painful/sensitive Left Ankle; LEE bilat, wound vac, JP drains x 2, NG tube Restrictions Weight Bearing Restrictions: No      Mobility  Bed Mobility Overal bed mobility: Needs Assistance Bed Mobility: Supine to Sit;Sit to Supine     Supine to sit: Max assist;+2 for physical assistance Sit to supine: Total assist;+2 for physical assistance   General bed mobility comments: pt assisting with raising her trunk, total assist for LE in/out of bed and to guide trunk back to supine; limited by pain, moaning with all mobility  Transfers                  General transfer comment: deferred this visit  Ambulation/Gait                Stairs            Wheelchair Mobility    Modified Rankin (Stroke Patients Only)       Balance Overall balance assessment: Needs assistance Sitting-balance support: Bilateral upper extremity supported;Feet supported Sitting balance-Leahy Scale: Fair Sitting balance - Comments: min guard assist, sat x 5 minutes                                     Pertinent Vitals/Pain Pain Assessment: Faces Faces Pain Scale: Hurts whole lot Pain Location: abdomen, LEs Pain Descriptors / Indicators: Grimacing;Guarding;Moaning Pain Intervention(s): Monitored during session;Repositioned;Premedicated before session    Home Living Family/patient expects to be discharged to:: Skilled nursing facility Living Arrangements: Spouse/significant other(with dementia) Available Help at Discharge: Family;Personal care attendant;Available 24 hours/day Type of Home: Independent living facility Home Access: Level entry     Home Layout: One level Home Equipment: Grab bars - tub/shower      Prior Function Level of Independence: Independent         Comments: at baseline patient very independent, drives, able to care for herself     Hand Dominance   Dominant Hand: Right    Extremity/Trunk Assessment   Upper Extremity Assessment Upper Extremity Assessment: Generalized weakness(edematous)    Lower Extremity Assessment Lower Extremity Assessment: Generalized weakness(edematous)       Communication   Communication: No difficulties  Cognition Arousal/Alertness: Awake/alert  Behavior During Therapy: WFL for tasks assessed/performed Overall Cognitive Status: Impaired/Different from baseline Area of Impairment: Following commands;Problem solving                       Following Commands: Follows one step commands inconsistently     Problem Solving: Decreased  initiation;Difficulty sequencing;Requires verbal cues        General Comments General comments (skin integrity, edema, etc.): SpO2 down to 88% on RA; replaced [place] O2.     Exercises     Assessment/Plan    PT Assessment Patient needs continued PT services  PT Problem List Decreased strength;Decreased mobility;Decreased safety awareness;Decreased coordination;Decreased knowledge of precautions;Decreased activity tolerance;Decreased balance;Decreased cognition;Pain       PT Treatment Interventions DME instruction;Therapeutic activities;Gait training;Therapeutic exercise;Patient/family education;Stair training;Balance training;Functional mobility training;Neuromuscular re-education;Manual techniques    PT Goals (Current goals can be found in the Care Plan section)  Acute Rehab PT Goals Patient Stated Goal: to get well, return to PLOF  PT Goal Formulation: With patient/family Time For Goal Achievement: 02/17/18 Potential to Achieve Goals: Fair    Frequency Min 2X/week   Barriers to discharge        Co-evaluation PT/OT/SLP Co-Evaluation/Treatment: Yes Reason for Co-Treatment: Complexity of the patient's impairments (multi-system involvement);For patient/therapist safety PT goals addressed during session: Mobility/safety with mobility;Balance OT goals addressed during session: Strengthening/ROM       AM-PAC PT "6 Clicks" Daily Activity  Outcome Measure Difficulty turning over in bed (including adjusting bedclothes, sheets and blankets)?: Unable Difficulty moving from lying on back to sitting on the side of the bed? : Unable Difficulty sitting down on and standing up from a chair with arms (e.g., wheelchair, bedside commode, etc,.)?: Unable Help needed moving to and from a bed to chair (including a wheelchair)?: A Lot Help needed walking in hospital room?: Total Help needed climbing 3-5 steps with a railing? : Total 6 Click Score: 7    End of Session Equipment Utilized During  Treatment: Oxygen Activity Tolerance: Patient limited by pain Patient left: in bed;with call bell/phone within reach;with bed alarm set;with family/visitor present Nurse Communication: Mobility status PT Visit Diagnosis: Unsteadiness on feet (R26.81);Muscle weakness (generalized) (M62.81);Difficulty in walking, not elsewhere classified (R26.2)    Time: 1937-9024 PT Time Calculation (min) (ACUTE ONLY): 21 min   Charges:   PT Evaluation $PT Re-evaluation: 1 Re-eval     PT G Codes:       Mabeline Caras, PT, DPT Acute Rehab Services  Pager: [phone] 02/03/2018, 2:29 PM

## 2018-02-03 NOTE — Progress Notes (Signed)
[facility] P[facility]  Bleeding from arterial line site  Sandbag to groin Transfuse 1 U PRBC Hold heparin for 6 hours after bleeding stops, then restart no bolus  Roselie Awkward, MD [facility] Pager: [phone] Cell: [phone] After 3pm or if no response, call [phone]

## 2018-02-03 NOTE — Progress Notes (Signed)
[facility] Progress Note  2 Days Post-Op  Subjective: CC: intubated, abdominal pain Patient remains intubated but is alert and able to nod yes/no and follow commands. Patient reports abdominal pain, pushes away while trying to examine abdomen. On amiodarone. Off fentanyl gtt and getting IV pushes for pain  Objective: Vital signs in last 24 hours: Temp:  [98.1 F (36.7 C)-100.8 F (38.2 C)] 99 F (37.2 C) (05/02 0354) Pulse Rate:  [41-159] 94 (05/02 0700) Resp:  [11-36] 24 (05/02 0700) BP: (87-147)/(28-111) 87/67 (05/02 0700) SpO2:  [91 %-98 %] 98 % (05/02 0700) FiO2 (%):  [40 %] 40 % (05/02 0400) Weight:  [77.8 kg (171 lb 8.3 oz)] 77.8 kg (171 lb 8.3 oz) (05/02 0200) Last BM Date: 01/29/18  Intake/Output from previous day: 05/01 0701 - 05/02 0700 In: 2694.2 [I.V.:2394.2; NG/GT:50; IV Piggyback:250] Out: 1000 [Urine:425; Emesis/NG output:100; Drains:475] Intake/Output this shift: No intake/output data recorded.  PE: Gen:  Intubated, arousable Card:  Very edematous  Pulm:  On the vent, chewing on ETT Abd: Soft, appropriately tender, VAC with good seal to midline wound and small amount of ascitic fluid and inferior portion of wound, IR drain with minimal output, surgical drain with serosanguinous output, bowel sounds hypoactive, moderate distention  Skin: warm and dry, no rashes  Neuro: follows commands  Lab Results:  Recent Labs    02/02/18 0453 02/03/18 0315  WBC 37.2* 39.7*  HGB 8.8* 8.3*  HCT 26.8* 24.8*  PLT 178 143*   BMET Recent Labs    02/02/18 0453 02/03/18 0315  NA 142 143  K 3.6 4.5  CL 105 111  CO2 30 27  GLUCOSE 272* 152*  BUN 50* 72*  CREATININE 0.98 1.49*  CALCIUM 8.2* 7.8*   PT/INR Recent Labs    02/02/18 0453 02/03/18 0315  LABPROT 19.5* 21.0*  INR 1.66 1.83   CMP     Component Value Date/Time   NA 143 02/03/2018 0315   NA 136 01/04/2018 1416   K 4.5 02/03/2018 0315   CL 111 02/03/2018 0315   CO2 27 02/03/2018  0315   GLUCOSE 152 (H) 02/03/2018 0315   BUN 72 (H) 02/03/2018 0315   BUN 16 01/04/2018 1416   CREATININE 1.49 (H) 02/03/2018 0315   CREATININE 0.67 03/13/2015 1419   CALCIUM 7.8 (L) 02/03/2018 0315   PROT 4.4 (L) 02/02/2018 0453   PROT 7.3 12/27/2017 1423   ALBUMIN 1.7 (L) 02/02/2018 0453   ALBUMIN 3.2 (L) 12/27/2017 1423   AST 29 02/02/2018 0453   ALT 12 (L) 02/02/2018 0453   ALKPHOS 45 02/02/2018 0453   BILITOT 2.5 (H) 02/02/2018 0453   BILITOT 0.9 12/27/2017 1423   GFRNONAA 31 (L) 02/03/2018 0315   GFRAA 36 (L) 02/03/2018 0315   Lipase     Component Value Date/Time   LIPASE 46 01/26/2018 1544       Studies/Results: Dg Chest 1 View  Result Date: 01/25/2018 CLINICAL DATA:  84 year old female with evidence of small bowel obstruction in the pelvis on CT. Abnormal abdominal and pelvic fluid collections, percutaneous drainage catheter placed. NG tube may have been pulled back. EXAM: CHEST  1 VIEW COMPARISON:  KUB 01/31/2018 and earlier. FINDINGS: Portable AP semi upright view at 0815 hours. Enteric tube courses to the abdomen as before, tip not included. Veiling opacity persists at the lung bases greater on the right. New coarse and confluent left mid and upper lung opacity since 01/28/2018. No definite new opacity in the right lung. No  pneumothorax identified. Stable cardiomegaly and mediastinal contours. Prior valve replacement. IMPRESSION: 1. New confluent left upper lung opacity suspicious for a New Left Lung Pneumonia since 01/28/2018. 2. Continued right greater than left lower lung veiling opacities seen to reflect pleural effusions and atelectasis on the most recent CT Abdomen and Pelvis. Electronically Signed   By: Genevie Ann M.D.   On: 01/17/2018 10:03   Dg Chest Port 1 View  Result Date: 02/03/2018 CLINICAL DATA:  84 year old female perforated appendicitis, small bowel obstruction related to right lower quadrant inflammatory mass. Postoperative day 2 exploratory laparotomy,  small-bowel resection, right salpingectomy, drainage of abdominal abscess. EXAM: PORTABLE CHEST 1 VIEW COMPARISON:  02/02/2018 and earlier. FINDINGS: Portable AP semi upright view at 0340 hours. Intubated. Endotracheal tube tip between the clavicles and carina. Enteric tube courses to the abdomen as before, tip not included. Stable right PICC line. Continued veiling opacity in both lower lungs. Superimposed patchy bilateral perihilar opacity. Stable lung volumes in ventilation since yesterday. No pneumothorax. Paucity bowel gas in the upper abdomen. IMPRESSION: 1.  Stable lines and tubes. 2. Stable ventilation since yesterday with bilateral pleural effusions, lower lobe collapse or consolidation, and additional nonspecific perihilar patchy opacity. Bilateral pneumonia is possible. Electronically Signed   By: Genevie Ann M.D.   On: 02/03/2018 08:05   Dg Chest Port 1 View  Result Date: 02/02/2018 CLINICAL DATA:  Respiratory failure EXAM: PORTABLE CHEST 1 VIEW COMPARISON:  01/31/2018 FINDINGS: Endotracheal tip approximately 7.5 cm above the carina. Left jugular central venous catheter tip SVC. Right arm PICC tip in the SVC. NG enters the stomach with the tip not visualized Diffuse bilateral airspace disease unchanged. Bilateral pleural effusions and bibasilar airspace disease right greater than left also unchanged. IMPRESSION: Endotracheal tube 7.5 cm above the carina Diffuse bilateral airspace disease and bilateral effusions unchanged from yesterday. Electronically Signed   By: Franchot Gallo M.D.   On: 02/02/2018 07:34   Dg Chest Port 1 View  Result Date: 01/03/2018 CLINICAL DATA:  Status post abdominal surgery, respiratory failure. EXAM: PORTABLE CHEST 1 VIEW COMPARISON:  Radiograph of same day. FINDINGS: Stable cardiomegaly. Endotracheal and nasogastric tubes are unchanged in position. No pneumothorax is noted. Right-sided PICC line is unchanged in position. Left internal jugular catheter is noted with distal  tip in expected position of the SVC. Central pulmonary vascular congestion is noted. Bilateral perihilar opacities are noted concerning for pneumonia or edema. Stable bilateral pleural effusions are noted, right greater than left with associated atelectasis or edema. Bony thorax is unremarkable. IMPRESSION: Stable support apparatus. Stable bilateral lung opacities are noted concerning for edema with associated pleural effusions. Aortic Atherosclerosis (ICD10-I70.0). Electronically Signed   By: Marijo Conception, M.D.   On: 01/27/2018 21:57   Dg Chest Port 1 View  Result Date: 01/17/2018 CLINICAL DATA:  84 year old female post central line placement. Subsequent encounter. EXAM: PORTABLE CHEST 1 VIEW COMPARISON:  01/09/2018 8:15 a.m. FINDINGS: Endotracheal tube tip 6.2 cm above the carina. Left central line has been placed from left jugular position. Tip is at the level of the proximal superior vena cava directed laterally. This could impinge upon the lateral wall of the superior vena cava with injection. Right PICC line tip mid superior vena cava level. Nasogastric tube courses below the diaphragm. Tip is not included on the present exam. Post median sternotomy and valve replacement.  Cardiomegaly. Asymmetric airspace disease with right-sided pleural effusion. Pulmonary edema may contribute to this appearance although infectious infiltrate is also consideration in the  proper clinical setting. Right base atelectasis. No pneumothorax. Calcified tortuous aorta. IMPRESSION: Since the prior examination, endotracheal tube has been placed with tip 6.2 cm above the carina. Left central line has been placed from left jugular position. Tip is at the level of the proximal superior vena cava directed laterally. This could impinge upon the lateral wall of the superior vena cava with injection. Remainder of findings without significant changes detailed above. These results will be called to the ordering clinician or  representative by the Radiologist Assistant, and communication documented in the PACS or zVision Dashboard. Electronically Signed   By: Genia Del M.D.   On: 01/22/2018 16:19   Dg Abd Portable 1v  Result Date: 01/14/2018 CLINICAL DATA:  Bowel obstruction EXAM: PORTABLE ABDOMEN - 1 VIEW COMPARISON:  January 31, 2018 abdominal radiograph and abdominal CT January 30, 2018 FINDINGS: Nasogastric tube tip and side port are in the stomach. There remain loops of dilated small and large bowel. There is contrast in the ascending colon. There is a drain in the right pelvic region. There is a loculated pleural effusion in the lateral right base. IMPRESSION: Nasogastric tube tip and side port in stomach. The bowel gas pattern is more suggestive of ileus than bowel obstruction, although a degree of bowel obstruction is certainly possible. No free air. Loculated pleural effusion right base. Electronically Signed   By: Lowella Grip III M.D.   On: 01/06/2018 09:58    Anti-infectives: Anti-infectives (From admission, onward)   Start     Dose/Rate Route Frequency Ordered Stop   01/29/18 1000  vancomycin (VANCOCIN) 500 mg in sodium chloride 0.9 % 100 mL IVPB  Status:  Discontinued     500 mg 100 mL/hr over 60 Minutes Intravenous Every 12 hours 01/28/18 1411 01/22/2018 0812   01/28/18 1500  vancomycin (VANCOCIN) IVPB 1000 mg/200 mL premix     1,000 mg 200 mL/hr over 60 Minutes Intravenous  Once 01/28/18 1411 01/29/18 0102   01/22/18 1600  piperacillin-tazobactam (ZOSYN) IVPB 3.375 g     3.375 g 12.5 mL/hr over 240 Minutes Intravenous Every 8 hours 01/22/18 1014     01/20/18 1500  piperacillin-tazobactam (ZOSYN) IVPB 2.25 g  Status:  Discontinued     2.25 g 100 mL/hr over 30 Minutes Intravenous Every 6 hours 01/20/18 1211 01/22/18 1014   01/20/18 0500  piperacillin-tazobactam (ZOSYN) IVPB 2.25 g  Status:  Discontinued     2.25 g 100 mL/hr over 30 Minutes Intravenous Every 6 hours 01/31/2018 2220 01/20/18 1211    01/28/2018 2330  rifaximin (XIFAXAN) tablet 550 mg  Status:  Discontinued     550 mg Oral 2 times daily 01/24/2018 2224 02/02/18 0914   01/18/2018 2230  piperacillin-tazobactam (ZOSYN) IVPB 3.375 g     3.375 g 100 mL/hr over 30 Minutes Intravenous  Once 01/21/2018 2220 01/20/2018 2300       Assessment/Plan Status post MVR and AVR on Coumadin Cirrhosis with portal HTN  Perforated appendicitis with abscess versus neoplasia.  S/p ex-lap, appendectomy, SB resection, R salpingectomy, drain, VAC 01/11/2018 Dr. Georgette Dover - S/p perc drain 4/24- purulent output - POD#2 - WBC 39.7, afebrile - drain 1 with 375 cc in 24h - continue drain and IV abx - wean as tolerated to extubate, appreciate CCM assistance - await bowel function  - VAC change tomorrow   FEN: TPN VTE: heparin gtt  ID: PO rifaximin 4/17>>, vancomycin 4/27>4/30, Zosyn 4/20>>    LOS: 15 days    Brigid Re , PA-C  [facility] 02/03/2018, 8:12 AM Pager: [phone] Consults: [phone] Mon-Fri 7:00 am-4:30 pm Sat-Sun 7:00 am-11:30 am

## 2018-02-04 ENCOUNTER — Inpatient Hospital Stay (HOSPITAL_COMMUNITY): Payer: Medicare Other

## 2018-02-04 LAB — CBC
HEMATOCRIT: 25.5 % — AB (ref 36.0–46.0)
Hemoglobin: 8.5 g/dL — ABNORMAL LOW (ref 12.0–15.0)
MCH: 31.8 pg (ref 26.0–34.0)
MCHC: 33.3 g/dL (ref 30.0–36.0)
MCV: 95.5 fL (ref 78.0–100.0)
PLATELETS: 104 10*3/uL — AB (ref 150–400)
RBC: 2.67 MIL/uL — AB (ref 3.87–5.11)
RDW: 19.3 % — AB (ref 11.5–15.5)
WBC: 33.5 10*3/uL — AB (ref 4.0–10.5)

## 2018-02-04 LAB — GLUCOSE, CAPILLARY
GLUCOSE-CAPILLARY: 142 mg/dL — AB (ref 65–99)
GLUCOSE-CAPILLARY: 147 mg/dL — AB (ref 65–99)
GLUCOSE-CAPILLARY: 156 mg/dL — AB (ref 65–99)
Glucose-Capillary: 161 mg/dL — ABNORMAL HIGH (ref 65–99)
Glucose-Capillary: 192 mg/dL — ABNORMAL HIGH (ref 65–99)
Glucose-Capillary: 178 mg/dL — ABNORMAL HIGH (ref 65–99)

## 2018-02-04 LAB — PROTIME-INR
INR: 1.81
Prothrombin Time: 20.9 seconds — ABNORMAL HIGH (ref 11.4–15.2)

## 2018-02-04 LAB — BASIC METABOLIC PANEL
Anion gap: 7 (ref 5–15)
Anion gap: 8 (ref 5–15)
BUN: 86 mg/dL — ABNORMAL HIGH (ref 6–20)
BUN: 92 mg/dL — AB (ref 6–20)
CALCIUM: 7.6 mg/dL — AB (ref 8.9–10.3)
CHLORIDE: 114 mmol/L — AB (ref 101–111)
CO2: 26 mmol/L (ref 22–32)
CO2: 27 mmol/L (ref 22–32)
Calcium: 7.7 mg/dL — ABNORMAL LOW (ref 8.9–10.3)
Chloride: 111 mmol/L (ref 101–111)
Creatinine, Ser: 1.64 mg/dL — ABNORMAL HIGH (ref 0.44–1.00)
Creatinine, Ser: 1.77 mg/dL — ABNORMAL HIGH (ref 0.44–1.00)
GFR calc Af Amer: 29 mL/min — ABNORMAL LOW (ref >60)
GFR calc non Af Amer: 25 mL/min — ABNORMAL LOW (ref >60)
GFR, EST AFRICAN AMERICAN: 32 mL/min — AB (ref >60)
GFR, EST NON AFRICAN AMERICAN: 28 mL/min — AB (ref >60)
GLUCOSE: 155 mg/dL — AB (ref 65–99)
Glucose, Bld: 173 mg/dL — ABNORMAL HIGH (ref 65–99)
POTASSIUM: 3.5 mmol/L (ref 3.5–5.1)
POTASSIUM: 4 mmol/L (ref 3.5–5.1)
Sodium: 144 mmol/L (ref 135–145)
Sodium: 149 mmol/L — ABNORMAL HIGH (ref 135–145)

## 2018-02-04 LAB — HEPARIN LEVEL (UNFRACTIONATED): Heparin Unfractionated: 0.32 IU/mL (ref 0.30–0.70)

## 2018-02-04 MED ORDER — SODIUM CHLORIDE 0.9% FLUSH: 10.0000 mL | INTRAVENOUS | Status: DC | PRN

## 2018-02-04 MED ORDER — SODIUM CHLORIDE 0.9% FLUSH
10.0000 mL | Freq: Two times a day (BID) | INTRAVENOUS | Status: DC
  Administered 2018-02-04: 10 mL

## 2018-02-04 MED ORDER — DEXTROSE 5 % IV SOLN
100.0000 mg | Freq: Once | INTRAVENOUS | Status: AC
  Administered 2018-02-05: 100 mg via INTRAVENOUS
  Filled 2018-02-04: qty 10

## 2018-02-04 MED ORDER — ALBUMIN HUMAN 5 % IV SOLN
25.0000 g | Freq: Once | INTRAVENOUS | Status: AC
  Administered 2018-02-04: 25 g via INTRAVENOUS
  Filled 2018-02-04: qty 250

## 2018-02-04 MED ORDER — CHLORHEXIDINE GLUCONATE CLOTH 2 % EX PADS
6.0000 | MEDICATED_PAD | Freq: Every day | CUTANEOUS | Status: DC
  Administered 2018-02-04 – 2018-02-05 (×2): 6 via TOPICAL

## 2018-02-04 MED ORDER — SODIUM CHLORIDE 0.9% FLUSH
10.0000 mL | Freq: Two times a day (BID) | INTRAVENOUS | Status: DC
  Administered 2018-02-04 – 2018-02-05 (×2): 40 mL
  Administered 2018-02-06: 10 mL

## 2018-02-04 MED ORDER — FUROSEMIDE 10 MG/ML IJ SOLN
100.0000 mg | Freq: Once | INTRAVENOUS | Status: AC
  Administered 2018-02-04: 100 mg via INTRAVENOUS
  Filled 2018-02-04: qty 10

## 2018-02-04 MED ORDER — HEPARIN (PORCINE) IN NACL 100-0.45 UNIT/ML-% IJ SOLN
1950.0000 [IU]/h | INTRAMUSCULAR | Status: DC
  Administered 2018-02-04 – 2018-02-06 (×4): 1950 [IU]/h via INTRAVENOUS
  Filled 2018-02-04 (×4): qty 250

## 2018-02-04 MED ORDER — FUROSEMIDE 10 MG/ML IJ SOLN
80.0000 mg | Freq: Once | INTRAMUSCULAR | Status: AC
  Administered 2018-02-04: 80 mg via INTRAVENOUS
  Filled 2018-02-04: qty 8

## 2018-02-04 MED ORDER — FUROSEMIDE 10 MG/ML IJ SOLN
20.0000 mg | Freq: Two times a day (BID) | INTRAMUSCULAR | Status: AC
  Administered 2018-02-04: 20 mg via INTRAVENOUS
  Filled 2018-02-04: qty 2

## 2018-02-04 MED ORDER — SODIUM CHLORIDE 3 % IN NEBU
4.0000 mL | INHALATION_SOLUTION | Freq: Two times a day (BID) | RESPIRATORY_TRACT | Status: AC
  Administered 2018-02-04 – 2018-02-06 (×3): 4 mL via RESPIRATORY_TRACT
  Filled 2018-02-04 (×3): qty 4

## 2018-02-04 MED ORDER — TRACE MINERALS CR-CU-MN-SE-ZN 10-1000-500-60 MCG/ML IV SOLN
INTRAVENOUS | Status: AC
  Administered 2018-02-04: 19:00:00 via INTRAVENOUS
  Filled 2018-02-04: qty 597.6

## 2018-02-04 MED ORDER — "THROMBI-PAD 3""X3"" EX PADS"
1.0000 | MEDICATED_PAD | Freq: Once | CUTANEOUS | Status: AC
  Administered 2018-02-04: 1 via TOPICAL
  Filled 2018-02-04: qty 1

## 2018-02-04 MED ORDER — POTASSIUM CHLORIDE 10 MEQ/50ML IV SOLN
10.0000 meq | INTRAVENOUS | Status: AC
  Administered 2018-02-04 (×4): 10 meq via INTRAVENOUS
  Filled 2018-02-04: qty 50

## 2018-02-04 MED ORDER — CHLORHEXIDINE GLUCONATE CLOTH 2 % EX PADS
6.0000 | MEDICATED_PAD | Freq: Every day | CUTANEOUS | Status: DC
  Administered 2018-02-04: 6 via TOPICAL

## 2018-02-04 MED ORDER — MORPHINE SULFATE (PF) 4 MG/ML IV SOLN
2.0000 mg | INTRAVENOUS | Status: DC | PRN
  Administered 2018-02-04 (×2): 4 mg via INTRAVENOUS
  Administered 2018-02-04: 2 mg via INTRAVENOUS
  Administered 2018-02-04: 4 mg via INTRAVENOUS
  Filled 2018-02-04 (×4): qty 1

## 2018-02-04 MED ORDER — SILVER NITRATE-POT NITRATE 75-25 % EX MISC
1.0000 "application " | Freq: Once | CUTANEOUS | Status: AC
  Administered 2018-02-04: 1 via TOPICAL
  Filled 2018-02-04: qty 1

## 2018-02-04 MED ORDER — FUROSEMIDE 10 MG/ML IJ SOLN: 40.0000 mg | Freq: Two times a day (BID) | INTRAMUSCULAR | Status: DC

## 2018-02-04 NOTE — Progress Notes (Signed)
ANTICOAGULATION CONSULT NOTE - Follow Up Consult  Pharmacy Consult for Heparin Indication: atrial fibrillation and mechanical AVR & MVR  Patient Measurements: Height: 5\' 6"  (167.6 cm) Weight: 170 lb 10.2 oz (77.4 kg) IBW/kg (Calculated) : 59.3 Heparin Dosing Weight: 58 kg  Vital Signs: Temp: 97.9 F (36.6 C) (05/03 1137) Temp Source: Axillary (05/03 1137) BP: 160/64 (05/03 1000) Pulse Rate: 103 (05/03 1000)  Labs: Recent Labs    02/02/18 0453  02/03/18 0315 02/03/18 1159 02/03/18 1617 02/03/18 2220 02/04/18 0324 02/04/18 1047  HGB 8.8*  --  8.3*  --  7.3* 8.9* 8.5*  --   HCT 26.8*  --  24.8*  --  22.5* 27.0* 25.5*  --   PLT 178  --  143*  --  129*  --  104*  --   LABPROT 19.5*  --  21.0*  --   --   --  20.9*  --   INR 1.66  --  1.83  --   --   --  1.81  --   HEPARINUNFRC  --    < > 0.64 0.82*  --  <0.10*  --  0.32  CREATININE 0.98  --  1.49*  --   --   --  1.64*  --    < > = values in this interval not displayed.    Estimated Creatinine Clearance: 27.3 mL/min (A) (by C-G formula based on SCr of 1.64 mg/dL (H)).  Assessment: Warfarin PTA for afib and mechanical valve. INR 1.81. H/H this AM is low and stable. Plt down to 104k. Heparin level is now therapeutic at 0.32. INR 1.81. Patient had some bleeding around arterial line site after removal which has now resolved.     Goal of Therapy:  Heparin level 0.3-0.5 units/ml Monitor platelets by anticoagulation protocol: Yes   Plan:  Continue IV heparin at 1950 units/hr  F/u 8 hr confirmatory heparin level Daily heparin level and CBC  Albertina Parr, PharmD., BCPS Clinical Pharmacist Clinical phone for 02/04/18 until 3:30pm: [phone] If after 3:30pm, please call main pharmacy at: [phone]

## 2018-02-04 NOTE — Progress Notes (Signed)
Wound as below. Small amount of bleeding, controlled with pressure. Silver nitrate sticks ordered and to remain at the bedside in case further skin bleeding with next VAC change.    Continue VAC changes M/W/F. Apply barrier around wound with drapes for improved patient comfort.  Brigid Re , [facility] 02/04/2018, 9:38 AM Pager: [phone] Consults: [phone] Mon-Fri 7:00 am-4:30 pm Sat-Sun 7:00 am-11:30 am

## 2018-02-04 NOTE — Progress Notes (Signed)
RT note: RT instructed patient with use of flutter valve. Patient had limited understanding and needed encouragement. Patient had productive cough post use.

## 2018-02-04 NOTE — Progress Notes (Signed)
PULMONARY / CRITICAL CARE MEDICINE   Name: [name] Chung MRN: [id number] DOB: 1932-11-05    ADMISSION DATE:  01/13/2018 CONSULTATION DATE: 01/13/2018   REFERRING MD: Georgette Dover   CHIEF COMPLAINT: abdominal pain  BRIEF SUMMARY:   84 yr old F with PMH Afib, mechanical atrial / mitral valves on coumadin, CHF who was admitted on 4/17 with abd pain, nausea and vomiting and was found to have small bowel obstruction.  Also found to have UTI. CT abdomen repeated and was showing concern for appendiceal abscess.   A drain was inserted via IR and conservative managemnet was recommended.  Repeated CT scan was showing no resolution of the abscess and her WBC continued to go up in addition to her abdominal pain so she was taken to the OR 4/30 for adhesion lysis, small bowel resection, appendectomy and abscess drainage. Patient required 3 pressors in the OR, returned to ICU intubated.   SUBJECTIVE:  Extubated 02/03/2018,  Tmax 99.  WBC decreased to 33.5.  I/O - 1.3L since admission. Left groin bleeding has stopped       VITAL SIGNS: BP (!) 155/58   Pulse (!) 106   Temp 97.8 F (36.6 C) (Axillary)   Resp (!) 22   Ht 5\' 6"  (1.676 m)   Wt 170 lb 10.2 oz (77.4 kg)   SpO2 96%   BMI 27.54 kg/m   HEMODYNAMICS:    VENTILATOR SETTINGS:    INTAKE / OUTPUT: I/O last 3 completed shifts: In: 4055.9 [I.V.:3398.4; Blood:315; NG/GT:80; IV Piggyback:262.5] Out: 2735 [Urine:1250; Emesis/NG output:300; Drains:1185]  PHYSICAL EXAMINATION: General:  Frail elderly female in NAD wearing [place], awake and alert, following commands HEENT: MM pink/dry,  NCAT Neuro: Awake, alert, follows commands deconditioned and weak, remains somewhat confused CV: s1s2 irregularly irregular, + valvular click PULM: bilateral chest excursion,  lungs bilaterally clear anterior, crackles/diminished lower, productive cough GI:  Soft, midline VAC, JP drains with serosanguinous fluid, BS + Extremities: warm/dry, 1-2+ pitting edema  Skin: no  rashes or lesions, Left groin dressing intact, no further bleeding noted  LABS:  BMET Recent Labs  Lab 02/02/18 0453 02/03/18 0315 02/04/18 0324  NA 142 143 144  K 3.6 4.5 3.5  CL 105 111 111  CO2 30 27 26   BUN 50* 72* 86*  CREATININE 0.98 1.49* 1.64*  GLUCOSE 272* 152* 173*    Electrolytes Recent Labs  Lab 01/31/18 0417  02/02/18 0453 02/03/18 0315 02/04/18 0324  CALCIUM 8.1*   < > 8.2* 7.8* 7.6*  MG 2.2  --   --  1.9  --   PHOS 4.0  --   --  4.4  --    < > = values in this interval not displayed.    CBC Recent Labs  Lab 02/03/18 0315 02/03/18 1617 02/03/18 2220 02/04/18 0324  WBC 39.7* 37.5*  --  33.5*  HGB 8.3* 7.3* 8.9* 8.5*  HCT 24.8* 22.5* 27.0* 25.5*  PLT 143* 129*  --  104*    Coag's Recent Labs  Lab 02/02/18 0453 02/03/18 0315 02/04/18 0324  INR 1.66 1.83 1.81    Sepsis Markers Recent Labs  Lab 01/28/18 1721 01/29/18 0438 01/30/18 0512 01/12/2018 2114  LATICACIDVEN  --   --   --  1.6  PROCALCITON 0.60 0.54 0.62  --     ABG Recent Labs  Lab 01/17/2018 1312 01/07/2018 1428 01/11/2018 2210  PHART 7.444 7.392 7.360  PCO2ART 42.6 43.1 51.2*  PO2ART 122.0* 153.0* 224*    Liver Enzymes  Recent Labs  Lab 01/31/18 0417 01/18/2018 1710 02/02/18 0453  AST 42* 30 29  ALT 12* 12* 12*  ALKPHOS 69 43 45  BILITOT 1.8* 3.2* 2.5*  ALBUMIN 1.8* 1.8* 1.7*    Cardiac Enzymes No results for input(s): TROPONINI, PROBNP in the last 168 hours.  Glucose Recent Labs  Lab 02/03/18 1152 02/03/18 1542 02/03/18 1937 02/04/18 0005 02/04/18 0323 02/04/18 0749  GLUCAP 180* 181* 160* 147* 156* 161*    Imaging Dg Chest Port 1 View  Result Date: 02/04/2018 CLINICAL DATA:  Acute respiratory failure with hypoxia. EXAM: PORTABLE CHEST 1 VIEW COMPARISON:  Radiograph of Feb 03, 2018. FINDINGS: Stable cardiomegaly. Atherosclerosis of thoracic aorta is noted. Status post aortic valve replacement. Nasogastric tube is seen entering the stomach. Left internal  jugular catheter tip is seen in SVC. Right-sided PICC line is unchanged in position. No pneumothorax is noted. Increased bilateral lung opacities are noted concerning for worsening pneumonia or edema, particularly in the left upper lobe. Bilateral pleural effusions are noted. Bony thorax is unremarkable. IMPRESSION: Mildly increased bilateral lung opacities are noted, consistent with worsening pneumonia or possibly edema, particularly in the left upper lobe. Bilateral pleural effusions are again noted. Stable support apparatus. Electronically Signed   By: Marijo Conception, M.D.   On: 02/04/2018 07:28   [place] Ekg Site Rite  Result Date: 02/03/2018 If Site Rite image not attached, placement could not be confirmed due to current cardiac rhythm.  STUDIES: CT ABD/Pelvis 4/328 >> small bowel dilation concerning for SBO, thickening of duodenum concerning for enteritis, expansion of L obturator muscle, large R / moderate L pleural effusions, cholelithiasis, anasarca, cirrhosis / portal venous hypertension  CULTURES: BCx2 4/17 >> negative BCx2 4/18 >> negative  Abdominal Abscess 4/24 >> abundant bacteroides fragilis, beta lactam positive  ANTIBIOTICS: Zosyn 4/17 >>  SIGNIFICANT EVENTS: 4/30  Exploratory laparotomy, lysis of adhesions, small bowel resection, appendectomy, right salpingectomy, drainage of abdominal abscess  LINES/TUBES: Right PICC 4/21 >> Left CVC IJ 4/30 >>  [facility] HD cath 5/1>>  DISCUSSION: 84 y/o F with multiple comorbidities including Afib, mechanical atrial and mitral valves on coumadin, CHF with perforated appendix and abdominal abscess collection s/p small bowel resection, appendectomy and abdominal drainage 4/30.  Returned to ICU on vent / pressors. She has subsequently been extubated to [place], pressors have been discontinued. She is hemodynamically stable  ASSESSMENT / PLAN:  PULMONARY A: Acute hypoxemic respiratory failure   Acute pulm edema  Bilateral pleural effusion   Worsening bilateral lung opacities per CXR 5/3   Edema vs pneumonia P:   Extubated 5/2 Aggressive Pulmonary hygiene post extubation>> worsening CXR post extubation IS every hour while awake  Flutter valve PRN BiPAP for increased WOB / respiratory fatigue CXR 5/3 Titrate oxygen as able for sats > 94% Mobilize as able Lasix as noted below  CARDIOVASCULAR A:  Septic Shock - in setting of SBO, abdominal abscess Afib with RVR Acute diastolic CHF Acute pulm edema Rheumatic Heart Disease s/p Mechanical Aortic and Mitral valve - on anticoagulation   P:  Tele Continue heparin gtt  Amiodarone gtt >> convert to PO dosing as able per cards Lasix to 40  mg IV Q12    TED hose to BLE Hold PO digoxin, metolazone  IV Lopressor 2.5mg  Q8  Appreciate Cardiology assist  RENAL A:   Hypokalemia  Rising Sr Cr - 5/3 to 1.64 HD cath L IJ P:   Trend BMP / urinary output Replace electrolytes as indicated Avoid nephrotoxic  agents, ensure adequate renal perfusion Lasix as above x 1 dose only ( Elevated creatinine)  GASTROINTESTINAL A:   Abdominal abscess Perforated appendix s/p appendectomy, abscess drainage and small bowel resection Liver cirrhosis   P:   NPO  TPN per pharmacy  Hold PO meds until cleared by surgery  Pepcid IV for SUP  Hold PO lactulose, rifaximin    HEMATOLOGIC A:   Anemia  Thrombocytopenia on heparin Left groin bleed resolved Transfused 1 unit 5/2 P:  Trend CBC  Monitor for bleeding  Heparin gtt as above  INFECTIOUS A:   Abdominal abscess s/p drainage  CXR concerning for worsening infiltrates P:   Zosyn, stop date in place  Monitor fever curve / WBC trend  Discontinue L IJ TLC once PICC line exchanged >> unable to do 5/2 due to bleeding Aggressive pulmonary toilet as above ENDOCRINE A:   Hyperglycemia P:   SSI  CBG's  NEUROLOGIC A:   Acute metabolic encephalopathy P:   RASS goal: n/a  Discontinue sedation protocol  PRN fentanyl for pain (low  dose)  PT / OT efforts    FAMILY  - Updates:  Family at bedside and updated.  CC Time: 30 minutes   Magdalen Spatz, [facility] Pgr: [phone] 02/04/2018, 10:00 AM

## 2018-02-04 NOTE — Progress Notes (Addendum)
[facility] Progress Note  3 Days Post-Op  Subjective: CC: sepsis No abdominal pain currently. No flatus. Asking for a sip of water.  Some leaking from around foley. Per RN, family requests pain medication be changed from fentanyl to morphine.   Objective: Vital signs in last 24 hours: Temp:  [97.2 F (36.2 C)-98.8 F (37.1 C)] 98.8 F (37.1 C) (05/03 0338) Pulse Rate:  [47-131] 101 (05/03 0700) Resp:  [13-31] 24 (05/03 0700) BP: (116-181)/(42-113) 168/60 (05/03 0700) SpO2:  [92 %-99 %] 97 % (05/03 0700) FiO2 (%):  [40 %] 40 % (05/02 0828) Weight:  [77.4 kg (170 lb 10.2 oz)] 77.4 kg (170 lb 10.2 oz) (05/03 0359) Last BM Date: 01/29/18  Intake/Output from previous day: 05/02 0701 - 05/03 0700 In: 2442.9 [I.V.:1947.9; Blood:315; NG/GT:30; IV Piggyback:150] Out: 2170 [Urine:1100; Emesis/NG output:200; Drains:870] Intake/Output this shift: No intake/output data recorded.  PE: Gen:  alert, in obvious discomfort Card:  Very edematous  Pulm:  normal effort, O2 sats 95% on [place], bilateral rales Abd: Soft, appropriately tender, VAC with good seal to midline wound and small amount of ascitic fluid and inferior portion of wound, IR drain with minimal output, surgical drain with serosanguinous output, bowel sounds hypoactive, moderate distention  Skin: warm and dry, no rashes  Neuro: follows commands    Lab Results:  Recent Labs    02/03/18 1617 02/03/18 2220 02/04/18 0324  WBC 37.5*  --  33.5*  HGB 7.3* 8.9* 8.5*  HCT 22.5* 27.0* 25.5*  PLT 129*  --  104*   BMET Recent Labs    02/03/18 0315 02/04/18 0324  NA 143 144  K 4.5 3.5  CL 111 111  CO2 27 26  GLUCOSE 152* 173*  BUN 72* 86*  CREATININE 1.49* 1.64*  CALCIUM 7.8* 7.6*   PT/INR Recent Labs    02/03/18 0315 02/04/18 0324  LABPROT 21.0* 20.9*  INR 1.83 1.81   CMP     Component Value Date/Time   NA 144 02/04/2018 0324   NA 136 01/04/2018 1416   K 3.5 02/04/2018 0324   CL 111 02/04/2018  0324   CO2 26 02/04/2018 0324   GLUCOSE 173 (H) 02/04/2018 0324   BUN 86 (H) 02/04/2018 0324   BUN 16 01/04/2018 1416   CREATININE 1.64 (H) 02/04/2018 0324   CREATININE 0.67 03/13/2015 1419   CALCIUM 7.6 (L) 02/04/2018 0324   PROT 4.4 (L) 02/02/2018 0453   PROT 7.3 12/27/2017 1423   ALBUMIN 1.7 (L) 02/02/2018 0453   ALBUMIN 3.2 (L) 12/27/2017 1423   AST 29 02/02/2018 0453   ALT 12 (L) 02/02/2018 0453   ALKPHOS 45 02/02/2018 0453   BILITOT 2.5 (H) 02/02/2018 0453   BILITOT 0.9 12/27/2017 1423   GFRNONAA 28 (L) 02/04/2018 0324   GFRAA 32 (L) 02/04/2018 0324   Lipase     Component Value Date/Time   LIPASE 46 01/08/2018 1544       Studies/Results: Dg Chest Port 1 View  Result Date: 02/04/2018 CLINICAL DATA:  Acute respiratory failure with hypoxia. EXAM: PORTABLE CHEST 1 VIEW COMPARISON:  Radiograph of Feb 03, 2018. FINDINGS: Stable cardiomegaly. Atherosclerosis of thoracic aorta is noted. Status post aortic valve replacement. Nasogastric tube is seen entering the stomach. Left internal jugular catheter tip is seen in SVC. Right-sided PICC line is unchanged in position. No pneumothorax is noted. Increased bilateral lung opacities are noted concerning for worsening pneumonia or edema, particularly in the left upper lobe. Bilateral pleural effusions are noted.  Bony thorax is unremarkable. IMPRESSION: Mildly increased bilateral lung opacities are noted, consistent with worsening pneumonia or possibly edema, particularly in the left upper lobe. Bilateral pleural effusions are again noted. Stable support apparatus. Electronically Signed   By: Marijo Conception, M.D.   On: 02/04/2018 07:28   Dg Chest Port 1 View  Result Date: 02/03/2018 CLINICAL DATA:  84 year old female perforated appendicitis, small bowel obstruction related to right lower quadrant inflammatory mass. Postoperative day 2 exploratory laparotomy, small-bowel resection, right salpingectomy, drainage of abdominal abscess. EXAM:  PORTABLE CHEST 1 VIEW COMPARISON:  02/02/2018 and earlier. FINDINGS: Portable AP semi upright view at 0340 hours. Intubated. Endotracheal tube tip between the clavicles and carina. Enteric tube courses to the abdomen as before, tip not included. Stable right PICC line. Continued veiling opacity in both lower lungs. Superimposed patchy bilateral perihilar opacity. Stable lung volumes in ventilation since yesterday. No pneumothorax. Paucity bowel gas in the upper abdomen. IMPRESSION: 1.  Stable lines and tubes. 2. Stable ventilation since yesterday with bilateral pleural effusions, lower lobe collapse or consolidation, and additional nonspecific perihilar patchy opacity. Bilateral pneumonia is possible. Electronically Signed   By: Genevie Ann M.D.   On: 02/03/2018 08:05   [place] Ekg Site Rite  Result Date: 02/03/2018 If Site Rite image not attached, placement could not be confirmed due to current cardiac rhythm.   Anti-infectives: Anti-infectives (From admission, onward)   Start     Dose/Rate Route Frequency Ordered Stop   01/29/18 1000  vancomycin (VANCOCIN) 500 mg in sodium chloride 0.9 % 100 mL IVPB  Status:  Discontinued     500 mg 100 mL/hr over 60 Minutes Intravenous Every 12 hours 01/28/18 1411 01/09/2018 0812   01/28/18 1500  vancomycin (VANCOCIN) IVPB 1000 mg/200 mL premix     1,000 mg 200 mL/hr over 60 Minutes Intravenous  Once 01/28/18 1411 01/29/18 0102   01/22/18 1600  piperacillin-tazobactam (ZOSYN) IVPB 3.375 g     3.375 g 12.5 mL/hr over 240 Minutes Intravenous Every 8 hours 01/22/18 1014 02/08/2018 0936   01/20/18 1500  piperacillin-tazobactam (ZOSYN) IVPB 2.25 g  Status:  Discontinued     2.25 g 100 mL/hr over 30 Minutes Intravenous Every 6 hours 01/20/18 1211 01/22/18 1014   01/20/18 0500  piperacillin-tazobactam (ZOSYN) IVPB 2.25 g  Status:  Discontinued     2.25 g 100 mL/hr over 30 Minutes Intravenous Every 6 hours 01/15/2018 2220 01/20/18 1211   01/27/2018 2330  rifaximin (XIFAXAN) tablet  550 mg  Status:  Discontinued     550 mg Oral 2 times daily 01/17/2018 2224 02/02/18 0914   01/07/2018 2230  piperacillin-tazobactam (ZOSYN) IVPB 3.375 g     3.375 g 100 mL/hr over 30 Minutes Intravenous  Once 01/11/2018 2220 01/09/2018 2300       Assessment/Plan Status post MVR and AVR on Coumadin Cirrhosis with portal HTN  Perforated appendicitis with abscess versus neoplasia.  S/p ex-lap, appendectomy, SB resection, R salpingectomy, drain, VAC 01/06/2018 Dr. Georgette Dover - S/p perc drain 4/24- purulent output - POD#3 - WBC 33.5 decreased from 39 yesterday, afebrile - drain 1 with 800 cc in 24h, lower drain with 20 cc in 24h - continue drain and IV abx - await bowel function  - VAC change today Acute on Chronic Diastolic CHF - cardiology recommendations noted, giving albumin bolus and continue lasix - CXR shows bilateral effusions and ?of PNA vs. Pulmonary edema - Cr elevated to 1.6 today, decrease lasix to 40 BID   FEN: TPN,  NPO with ice chips; discontinue IV fentanyl, IV morphine for pain control VTE: heparin gtt ID: PO rifaximin 4/17>>, vancomycin 4/27>4/30, Zosyn 4/20>>  Plan: VAC change today. NGT with minimal output, may discontinue but will discuss with MD given lack of bowel function. Continue drains and IV abx   LOS: 16 days    Brigid Re , [facility] 02/04/2018, 7:59 AM Pager: [phone] Consults: [phone] Mon-Fri 7:00 am-4:30 pm Sat-Sun 7:00 am-11:30 am

## 2018-02-04 NOTE — Progress Notes (Signed)
ANTICOAGULATION CONSULT NOTE - Follow Up Consult  Pharmacy Consult for Heparin Indication: atrial fibrillation and mechanical AVR & MVR  Patient Measurements: Height: 5\' 6"  (167.6 cm) Weight: 171 lb 8.3 oz (77.8 kg) IBW/kg (Calculated) : 59.3 Heparin Dosing Weight: 58 kg  Vital Signs: Temp: 97.4 F (36.3 C) (05/02 2000) Temp Source: Axillary (05/02 2000) BP: 152/69 (05/02 2300) Pulse Rate: 102 (05/02 2300)  Labs: Recent Labs    01/07/2018 1710 02/02/18 0453  02/03/18 0315 02/03/18 1159 02/03/18 1617 02/03/18 2220  HGB 8.6* 8.8*  --  8.3*  --  7.3* 8.9*  HCT 26.2* 26.8*  --  24.8*  --  22.5* 27.0*  PLT 160 178  --  143*  --  129*  --   LABPROT 20.1* 19.5*  --  21.0*  --   --   --   INR 1.73 1.66  --  1.83  --   --   --   HEPARINUNFRC  --   --    < > 0.64 0.82*  --  <0.10*  CREATININE 0.91 0.98  --  1.49*  --   --   --    < > = values in this interval not displayed.    Estimated Creatinine Clearance: 30.1 mL/min (A) (by C-G formula based on SCr of 1.49 mg/dL (H)).  Assessment:  Anticoag: warfarin PTA for afib and mechanical valve. INR 1.83, Resumed heparin 5/1 am.   Heparin was paused ~1400 yesterday due to bleeding following arterial line removal. Per cardiology note it is ok to resume heparin infusion ~6 hours most bleeding control. Evening RN reports bleeding has been controlled since the begining of his shift. The patient was transfused 1 uPRBC. HgB up 7.3>8.9   Goal of Therapy:  Heparin level 0.3-0.5 units/ml Monitor platelets by anticoagulation protocol: Yes   Plan:  Resume heparin drip at 1950 units/hr @ 0200 F/u 8 hr heparin level Daily heparin level and CBC  Georga Bora, PharmD Clinical Pharmacist 02/04/2018 12:15 AM

## 2018-02-04 NOTE — Progress Notes (Signed)
Dr. Lake Bells said to hold the heparin gtt for 2 hours prior to removing HD cath and to have thrombi pads on hand.  Irven [place], RN

## 2018-02-04 NOTE — Progress Notes (Signed)
Peripherally Inserted Central Catheter/Midline Placement  The IV Nurse has discussed with the patient and/or persons authorized to consent for the patient, the purpose of this procedure and the potential benefits and risks involved with this procedure.  The benefits include less needle sticks, lab draws from the catheter, and the patient may be discharged home with the catheter. Risks include, but not limited to, infection, bleeding, blood clot (thrombus formation), and puncture of an artery; nerve damage and irregular heartbeat and possibility to perform a PICC exchange if needed/ordered by physician.  Alternatives to this procedure were also discussed.  Bard Power PICC patient education guide, fact sheet on infection prevention and patient information card has been provided to patient /or left at bedside.    PICC/Midline Placement Documentation  PICC Triple Lumen 02/04/18 PICC Right Brachial 38 cm 0 cm (Active)  Indication for Insertion or Continuance of Line Prolonged intravenous therapies 02/04/2018  5:35 PM  Exposed Catheter (cm) 0 cm 02/04/2018  5:35 PM  Site Assessment Clean;Dry;Intact 02/04/2018  5:35 PM  Lumen #1 Status Flushed;Saline locked;Blood return noted 02/04/2018  5:35 PM  Lumen #2 Status Flushed;Saline locked;Blood return noted 02/04/2018  5:35 PM  Lumen #3 Status Flushed;Saline locked;Blood return noted 02/04/2018  5:35 PM  Dressing Type Transparent;Securing device 02/04/2018  5:35 PM  Dressing Status Antimicrobial disc in place;Intact;Dry;Clean 02/04/2018  5:35 PM  Dressing Change Due 02/11/18 02/04/2018  5:35 PM   DL PICC exchanged over a guidewire per protocol for a TL PICC by [facility] RN    Frances Maywood 02/04/2018, 5:38 PM

## 2018-02-04 NOTE — Progress Notes (Signed)
PHARMACY - ADULT TOTAL PARENTERAL NUTRITION CONSULT NOTE   Pharmacy Consult:  TPN Indication:  Prolonged ileus  Patient Measurements: Height: 5\' 6"  (167.6 cm) Weight: 170 lb 10.2 oz (77.4 kg) IBW/kg (Calculated) : 59.3 TPN AdjBW (KG): 63 Body mass index is 27.54 kg/m.  Assessment:  71 YOF presented on 01/21/2018 with N/V/D and abdominal pain, found to have pSBO. She was placed on a clear liquid diet on 01/21/18.  01/22/18 CT concerning for perforated appendicitis and patient may need surgery; therefore, she was made NPO again. Pharmacy consulted to manage TPN for ileus and prolonged NPO status.  Patient transferred to the ICU on 01/15/2018 s/p s/p ex-lap with LoA, SBR, appendectomy, drainage of abd abscess. Continue TPN with lipids given that patient has been on TPN since 01/23/18 and not a new start.  GI: cirrhosis.  Prealbumin remains <5, LBM 4/27, abscess drain O/P 487mL>870ml, NG O/P 162mL>200ml.  Pepcid IV, PRN Zofran. Vac change today  Endo: no hx DM - CBGs 147-180 Insulin requirements in the past 24 hours: 18 units SSI + 10 units in TPN  Lytes: K down to 3.5,  Mag 1.9 (goal >/= 2), Phos high normal, others WNL  Renal: SCr 0.98 >> 1.49>1.64 up again, BUN up to 86 - UOP 0.6 ml/kg/hr, net 1.3L since admit, anasarca (Albumin 25g IV x 1 on 5/3)  Pulm: Extubated  Cards: Rheumatic heart dz, Afib:  IV Lasix incr 4/29, IV metoprolol, IV amiodarone started 5/1, off digoxin  AC: Coumadin PTA for Afib / mech valves >> Heparin, s/p Vit K - hgb low but stable, plts down to 104 - 5/2: Bleeding from femoral arterial line site. Transfuse 1 unit. Hold heparin x 6 hrs.  Hepatobil: LFTs / TG WNL, Tbili 3.2>2.5  Neuro:   ID: Zosyn for appendiceal abscess/enteritis, cx grew Actinomyces and Bacteroides -CXR shows bilateral effusions and ?of PNA vs. Pulmonary edema. Tmax 99, WBC 39.7>33.5  TPN Access: PICC placed 01/23/18 TPN start date: 01/23/18  Nutritional Goals (per RD 5/1): 1300-1400 kCal,   81-93 g protein per day    Current Nutrition:  TPN   Plan:  Concentrate TPN - TPN at 45 ml/hr will provide 90g AA, 205g CHO and 29g ILE for a total of 1348 kCal, meeting 100% of patient's needs.  Electrolytes in TPN: Increase K+ in TPN. Cl:Ac 1:1  Daily multivitamin and trace elements in TPN  Continue moderate SSI Q4H.  Resume 10 units of insulin in TPN  D/C Pepcid bolus and add 40mg  to TPN  F/U AM labs, K+, Mg, and Phos  Ice chips    Ronda Rajkumar S. Alford Highland, PharmD, [facility] Clinical Staff Pharmacist Pager [phone]  02/04/2018, 8:55 AM

## 2018-02-04 NOTE — Progress Notes (Signed)
Nutrition Follow-up  DOCUMENTATION CODES:   Not applicable  INTERVENTION:   -Continue TPN per pharmacy -Current TPN meets 100% protein needs, 90% calorie needs (low end of calorie range).  -Recommend trial of sips of CL diet  NUTRITION DIAGNOSIS:   Inadequate oral intake related to acute illness(ileus) as evidenced by per patient/family report.  Being addressed via TPN  GOAL:   Patient will meet greater than or equal to 90% of their needs  Progressing  MONITOR:   Diet advancement, PO intake, Supplement acceptance, Weight trends  REASON FOR ASSESSMENT:   Consult Assessment of nutrition requirement/status  ASSESSMENT:   [name] Chung is an 84 yo female with PMH dementia, chronic A-fib, mechanical AVR & MVR replacement on coumadin, chronic diastolic CHF, Stg III CKD presented to ED 01/17/2018 with 3 days of nausea, nonbloody emesis, abdominal distension, and pain. She was seen by PCP started on cipro for presumed UTI. UA returned negative, so Cipro was stopped, patient also reported diarrhea. CT abdomen 4/20 suggested perforated appendicular abscess and ileus.  Surgery ordered PICC Line and TPN. Currently, vitamin K is being used to reverse INR so IR can place percutaneous drain of abscess.  4/30 Ex lap, LOA, SBR, appendectomy, right salpingectomy, drainage of abdominal abscess 5/2 Extubated  TPN continues, started on 4/21. TPN at 45 ml/hr will provide 90g AA, 205g CHO and 29g ILE for a total of 1348 kCal, meeting 100% of patient's needs.  Abdomen soft, distended, +flatus, BS hypoactive; recommend considering trial of CL diet NG tube with minimal output (200 mL in previous 24 hours) JP drains x 2 with 820 mL out Wound vac to midline abdominal incision (150 mL out)  Pt weight is up post-op, receiving lasix  Labs: reviewed Meds: reviewed  Diet Order:   Diet Order           Diet NPO time specified Except for: Ice Chips  Diet effective now          EDUCATION NEEDS:    Not appropriate for education at this time  Skin:  Skin Assessment: Reviewed RN Assessment Skin Integrity Issues:: Wound VAC Wound Vac: abdomen  Last BM:  01/29/18  Height:   Ht Readings from Last 1 Encounters:  01/22/2018 5\' 6"  (1.676 m)    Weight:   Wt Readings from Last 1 Encounters:  02/04/18 170 lb 10.2 oz (77.4 kg)    Ideal Body Weight:  58 kg  BMI:  Body mass index is 27.54 kg/m.  Estimated Nutritional Needs:   Kcal:  1500-1700 kcals  Protein:  77-96 g  Fluid:  >/= 1.5 L   Kerman Passey MS, RD, LDN, CNSC [phone] Pager  [phone] Weekend/On-Call Pager

## 2018-02-04 NOTE — Progress Notes (Addendum)
Progress Note  Patient Name: [name] Chung Date of Encounter: 02/04/2018  Primary Cardiologist: Peter Martinique, MD  Subjective   Sitting up in bed, tired. Still with abd pain but being managed with pain medications.   Inpatient Medications    Scheduled Meds: . chlorhexidine  15 mL Mouth Rinse BID  . chlorhexidine  15 mL Mouth Rinse BID  . Chlorhexidine Gluconate Cloth  6 each Topical Daily  . furosemide  20 mg Intravenous Q12H  . insulin aspart  0-15 Units Subcutaneous Q4H  . mouth rinse  15 mL Mouth Rinse q12n4p  . mouth rinse  15 mL Mouth Rinse q12n4p  . metoprolol tartrate  2.5 mg Intravenous Q8H  . sodium chloride flush  10-40 mL Intracatheter Q12H  . sodium chloride flush  10-40 mL Intracatheter Q12H  . sodium chloride flush  5 mL Intracatheter Q8H   Continuous Infusions: . sodium chloride 10 mL/hr at 02/04/18 0000  . amiodarone 30 mg/hr (02/04/18 0356)  . heparin 1,950 Units/hr (02/04/18 0228)  . piperacillin-tazobactam (ZOSYN)  IV 3.375 g (02/04/18 0834)  . potassium chloride    . TPN ADULT (ION) 45 mL/hr at 02/03/18 1954  . TPN ADULT (ION)     PRN Meds: hydrALAZINE, morphine injection, ondansetron (ZOFRAN) IV, sodium chloride flush, sodium chloride flush   Vital Signs    Vitals:   02/04/18 1000 02/04/18 1100 02/04/18 1137 02/04/18 1200  BP: (!) 160/64 (!) 138/53  (!) 136/57  Pulse: (!) 103 (!) 102  100  Resp: (!) 21 (!) 21  (!) 22  Temp:   97.9 F (36.6 C)   TempSrc:   Axillary   SpO2: 96% 96%  95%  Weight:      Height:        Intake/Output Summary (Last 24 hours) at 02/04/2018 1220 Last data filed at 02/04/2018 1004 Gross per 24 hour  Intake 2021.57 ml  Output 1925 ml  Net 96.57 ml   Filed Weights   02/02/18 0550 02/03/18 0200 02/04/18 0359  Weight: 169 lb 1.5 oz (76.7 kg) 171 lb 8.3 oz (77.8 kg) 170 lb 10.2 oz (77.4 kg)    Telemetry    Afib Rate 90-110 - Personally Reviewed  Physical Exam   General: Frail, ill-appearing older W female  appearing in no acute distress. Head: Normocephalic, atraumatic.  Neck: Supple, + JVD. Lungs:  Resp regular, productive cough. Heart: Irreg Irreg, S1, S2, mechanical valve click; no rub. Abdomen: Soft, tender with dry wound vac dressing in place, along with JP drain. No BS yet Extremities: continued anasarca  Neuro: Alert and oriented X 3. Moves all extremities spontaneously.  Labs    Chemistry Recent Labs  Lab 01/31/18 0417  01/24/2018 1710 02/02/18 0453 02/03/18 0315 02/04/18 0324  NA 135   < > 144 142 143 144  K 5.0   < > 3.4* 3.6 4.5 3.5  CL 101   < > 107 105 111 111  CO2 30   < > 28 30 27 26   GLUCOSE 191*   < > 179* 272* 152* 173*  BUN 43*   < > 45* 50* 72* 86*  CREATININE 1.07*   < > 0.91 0.98 1.49* 1.64*  CALCIUM 8.1*   < > 8.3* 8.2* 7.8* 7.6*  PROT 6.5  --  4.5* 4.4*  --   --   ALBUMIN 1.8*  --  1.8* 1.7*  --   --   AST 42*  --  30 29  --   --  ALT 12*  --  12* 12*  --   --   ALKPHOS 69  --  43 45  --   --   BILITOT 1.8*  --  3.2* 2.5*  --   --   GFRNONAA 47*   < > 57* 52* 31* 28*  GFRAA 54*   < > >60 >60 36* 32*  ANIONGAP 4*   < > 9 7 5 7    < > = values in this interval not displayed.     Hematology Recent Labs  Lab 02/03/18 0315 02/03/18 1617 02/03/18 2220 02/04/18 0324  WBC 39.7* 37.5*  --  33.5*  RBC 2.57* 2.30*  --  2.67*  HGB 8.3* 7.3* 8.9* 8.5*  HCT 24.8* 22.5* 27.0* 25.5*  MCV 96.5 97.8  --  95.5  MCH 32.3 31.7  --  31.8  MCHC 33.5 32.4  --  33.3  RDW 18.9* 19.5*  --  19.3*  PLT 143* 129*  --  104*    Cardiac EnzymesNo results for input(s): TROPONINI in the last 168 hours. No results for input(s): TROPIPOC in the last 168 hours.   BNPNo results for input(s): BNP, PROBNP in the last 168 hours.   DDimer No results for input(s): DDIMER in the last 168 hours.    Radiology    Dg Chest Port 1 View  Result Date: 02/04/2018 CLINICAL DATA:  Acute respiratory failure with hypoxia. EXAM: PORTABLE CHEST 1 VIEW COMPARISON:  Radiograph of Feb 03, 2018. FINDINGS: Stable cardiomegaly. Atherosclerosis of thoracic aorta is noted. Status post aortic valve replacement. Nasogastric tube is seen entering the stomach. Left internal jugular catheter tip is seen in SVC. Right-sided PICC line is unchanged in position. No pneumothorax is noted. Increased bilateral lung opacities are noted concerning for worsening pneumonia or edema, particularly in the left upper lobe. Bilateral pleural effusions are noted. Bony thorax is unremarkable. IMPRESSION: Mildly increased bilateral lung opacities are noted, consistent with worsening pneumonia or possibly edema, particularly in the left upper lobe. Bilateral pleural effusions are again noted. Stable support apparatus. Electronically Signed   By: Marijo Conception, M.D.   On: 02/04/2018 07:28   Dg Chest Port 1 View  Result Date: 02/03/2018 CLINICAL DATA:  84 year old female perforated appendicitis, small bowel obstruction related to right lower quadrant inflammatory mass. Postoperative day 2 exploratory laparotomy, small-bowel resection, right salpingectomy, drainage of abdominal abscess. EXAM: PORTABLE CHEST 1 VIEW COMPARISON:  02/02/2018 and earlier. FINDINGS: Portable AP semi upright view at 0340 hours. Intubated. Endotracheal tube tip between the clavicles and carina. Enteric tube courses to the abdomen as before, tip not included. Stable right PICC line. Continued veiling opacity in both lower lungs. Superimposed patchy bilateral perihilar opacity. Stable lung volumes in ventilation since yesterday. No pneumothorax. Paucity bowel gas in the upper abdomen. IMPRESSION: 1.  Stable lines and tubes. 2. Stable ventilation since yesterday with bilateral pleural effusions, lower lobe collapse or consolidation, and additional nonspecific perihilar patchy opacity. Bilateral pneumonia is possible. Electronically Signed   By: Genevie Ann M.D.   On: 02/03/2018 08:05   [place] Ekg Site Rite  Result Date: 02/03/2018 If Site Rite image not  attached, placement could not be confirmed due to current cardiac rhythm.   Cardiac Studies   N/a   Patient Profile     84 y.o. female with a history ofrheumatic valvular heart disease status post redo aortic valve replacement x3 and redo mitral valve surgery x2, persistent atrial fibrillation, coumadin for anticoagulation, chronic LE  edema (multifactorial with diastolic CHF, venous insufficiency, cirrhosis)now admitted withperforated appendix withSBO. We are assisting with management of diastolic heart failure and atrial fibrillation.    Assessment & Plan    1. Rheumatic heart disease s/p AVR x3 and MVR x2: Has a St. Jude mechanical mitral valve and Medtronic freestyle aortic root conduit.  -- coumadin has been held with heparin bridgeprior to surgery.Underwent SB resection, exp lap and appendectomy with Dr. Georgette Dover. Cleared to resume heparin but did have bleeding from femora aLine site yesterday with heparin paused. INR 1.81  2. Abd pain 2/2 to perforated appendicitis with abscess:now s/p SB resection, exp lap, and appendectomy with Dr. Georgette Dover. Required vasopressin and levophed, along with mechanical ventilation. PCCM managing. She was been extubated yesterday.   3. Chronic Afib: rates in the low 100s on amiodarone drip and IV lopressor. Would transition to oral dosing when appropriate.  -- dig held while NPO  4. Acute on Chronic diastolic TK:[id number] volume overload with diffuse anasarca. Albumin 1.7 5/1. She did have better UOP yesterday but Cr now up from 0.98>>1.49>>1.6 today. She is receiving albumin today. Dosed with IV lasix x1. Follow up on UOP and renal function.  5.Anemia:s/p PRBCs and plasma. Had bleeding from femoral aLine yesterday and received additional PRBCs. Hgb 8.5 today.Femoral site with dressing in place, no active bleeding at this time.    Signed, Reino Bellis, NP  02/04/2018, 12:20 PM  Pager # [phone]   Patient seen, examined. Available data  reviewed. Agree with findings, assessment, and plan as outlined by Reino Bellis, NP.  The patient remains uncomfortable with abdominal pain.  Exam reveals an elderly woman in mild distress.  Lungs have crackles bilaterally.  Heart is irregular with normal mechanical mitral closure sound and grade 2/6 systolic murmur at the left sternal border.  Extremities demonstrate diffuse anasarca/2-3+ edema.  Agree with plans as outlined with continued diuresis.  Her creatinine might be plateauing as she has only gone from 1.49-1.6 over the last 24 hours.  Agree with current management of the CCM team using high-dose intravenous Lasix.  Agree with heparin anticoagulation in the setting of atrial fibrillation and mechanical mitral valve with transition to warfarin when she is able to take oral medication.  Otherwise as outlined above.  Sherren Mocha, M.D. 02/04/2018 3:43 PM   For questions or updates, please contact [place] Please consult [email or web address] for contact info under Cardiology/STEMI.

## 2018-02-04 NOTE — Consult Note (Signed)
[place] Nurse wound consult note: POD 3  Patient is s/p exploratory laparotomy, appendectomy, SB resection, right salpingectomy, drain, wound VAC on 01/16/2018 (Dr. Georgette Dover)  Reason for Consult: NPWT first surgical dressing change.  Patient seen today with Brigid Re, CCS PA. Wound type:Midline surgical incision Pressure Injury POA: NA Measurement:  21.5cm x 3cm x 2cm Wound ZYY:QMGNO red, minor bleeding at 12 o'clock, 3 o'clock (right lateral edge) and 6 o'clock.  Silver Nitrate sticks obtained to bedside, but bleeding was controlled by gentle hand pressure before they arrived. Drainage (amount, consistency, odor) Serosanguinous Periwound: Intact, dry Dressing procedure/placement/frequency: Patient was premedicated for pain 30 minutes prior to procedure. Drape and one piece of black foam is removed from wound bed.  WOund cleansed with NS and gently patted dry. Sutures evident in distal third of wound bed. Photo obtained by Ms. Rayburn, see her note. Drape strips applied to the periwound tissue, a skin barrier ring is fashioned along the distal wound margin to accommodate minor creasing of the abdomen in this area. One piece of black foam is applied as a wound contact layer, trimmed to accommodate umbilicus. Drape is applied and T.R.A.C. pad affixed. An immediate seal is achieved at 117mmHg continuous negative pressure. Patient tolerated procedure well.   Subsequent dressing changes are to be performed by bedside RN on Mondays, Wednesday, and Fridays. Orders amended to reflect this change.  [place] nursing team will not follow, but will remain available to this patient, the nursing and medical teams.  Please re-consult if needed. Thanks, Maudie Flakes, MSN, RN, [place], Arther Abbott  Pager# [phone]

## 2018-02-05 ENCOUNTER — Inpatient Hospital Stay (HOSPITAL_COMMUNITY): Payer: Medicare Other

## 2018-02-05 DIAGNOSIS — K56609 Unspecified intestinal obstruction, unspecified as to partial versus complete obstruction: Secondary | ICD-10-CM

## 2018-02-05 DIAGNOSIS — E871 Hypo-osmolality and hyponatremia: Secondary | ICD-10-CM

## 2018-02-05 LAB — BASIC METABOLIC PANEL
Anion gap: 7 (ref 5–15)
BUN: 105 mg/dL — AB (ref 6–20)
CALCIUM: 7.5 mg/dL — AB (ref 8.9–10.3)
CO2: 27 mmol/L (ref 22–32)
CREATININE: 2.07 mg/dL — AB (ref 0.44–1.00)
Chloride: 112 mmol/L — ABNORMAL HIGH (ref 101–111)
GFR, EST AFRICAN AMERICAN: 24 mL/min — AB (ref >60)
GFR, EST NON AFRICAN AMERICAN: 21 mL/min — AB (ref >60)
Glucose, Bld: 141 mg/dL — ABNORMAL HIGH (ref 65–99)
Potassium: 4.7 mmol/L (ref 3.5–5.1)
Sodium: 146 mmol/L — ABNORMAL HIGH (ref 135–145)

## 2018-02-05 LAB — PREPARE RBC (CROSSMATCH)

## 2018-02-05 LAB — HEPARIN LEVEL (UNFRACTIONATED): HEPARIN UNFRACTIONATED: 0.3 [IU]/mL (ref 0.30–0.70)

## 2018-02-05 LAB — CBC
HCT: 24 % — ABNORMAL LOW (ref 36.0–46.0)
Hemoglobin: 7.5 g/dL — ABNORMAL LOW (ref 12.0–15.0)
MCH: 31.5 pg (ref 26.0–34.0)
MCHC: 31.3 g/dL (ref 30.0–36.0)
MCV: 100.8 fL — ABNORMAL HIGH (ref 78.0–100.0)
Platelets: 98 10*3/uL — ABNORMAL LOW (ref 150–400)
RBC: 2.38 MIL/uL — ABNORMAL LOW (ref 3.87–5.11)
RDW: 20.9 % — AB (ref 11.5–15.5)
WBC: 41.1 10*3/uL — AB (ref 4.0–10.5)

## 2018-02-05 LAB — PROTIME-INR
INR: 1.79
PROTHROMBIN TIME: 20.6 s — AB (ref 11.4–15.2)

## 2018-02-05 LAB — BLOOD GAS, ARTERIAL
Acid-base deficit: 2.3 mmol/L — ABNORMAL HIGH (ref 0.0–2.0)
BICARBONATE: 24.7 mmol/L (ref 20.0–28.0)
DRAWN BY: 520751
FIO2: 80
MODE: POSITIVE
O2 Saturation: 98.1 %
PATIENT TEMPERATURE: 98.6
PEEP: 5 cmH2O
PO2 ART: 116 mmHg — AB (ref 83.0–108.0)
PRESSURE CONTROL: 15 cmH2O
RATE: 10 resp/min
pCO2 arterial: 64.6 mmHg — ABNORMAL HIGH (ref 32.0–48.0)
pH, Arterial: 7.207 — ABNORMAL LOW (ref 7.350–7.450)

## 2018-02-05 LAB — GLUCOSE, CAPILLARY
GLUCOSE-CAPILLARY: 128 mg/dL — AB (ref 65–99)
GLUCOSE-CAPILLARY: 132 mg/dL — AB (ref 65–99)
GLUCOSE-CAPILLARY: 170 mg/dL — AB (ref 65–99)
GLUCOSE-CAPILLARY: 171 mg/dL — AB (ref 65–99)
GLUCOSE-CAPILLARY: 196 mg/dL — AB (ref 65–99)
Glucose-Capillary: 127 mg/dL — ABNORMAL HIGH (ref 65–99)
Glucose-Capillary: 205 mg/dL — ABNORMAL HIGH (ref 65–99)

## 2018-02-05 LAB — PHOSPHORUS: PHOSPHORUS: 6.5 mg/dL — AB (ref 2.5–4.6)

## 2018-02-05 LAB — MAGNESIUM: MAGNESIUM: 2.4 mg/dL (ref 1.7–2.4)

## 2018-02-05 MED ORDER — FUROSEMIDE 10 MG/ML IJ SOLN
INTRAMUSCULAR | Status: AC
  Filled 2018-02-05: qty 2

## 2018-02-05 MED ORDER — FUROSEMIDE 10 MG/ML IJ SOLN
20.0000 mg | Freq: Once | INTRAMUSCULAR | Status: AC
  Administered 2018-02-05: 20 mg via INTRAVENOUS
  Filled 2018-02-05: qty 2

## 2018-02-05 MED ORDER — SODIUM CHLORIDE 0.9 % IV SOLN
Freq: Once | INTRAVENOUS | Status: AC
  Administered 2018-02-05: 10:00:00 via INTRAVENOUS

## 2018-02-05 MED ORDER — MORPHINE SULFATE (PF) 4 MG/ML IV SOLN
2.0000 mg | INTRAVENOUS | Status: DC | PRN
  Administered 2018-02-05 – 2018-02-06 (×7): 2 mg via INTRAVENOUS
  Filled 2018-02-05 (×5): qty 1

## 2018-02-05 MED ORDER — TRACE MINERALS CR-CU-MN-SE-ZN 10-1000-500-60 MCG/ML IV SOLN
INTRAVENOUS | Status: DC
  Administered 2018-02-05: 19:00:00 via INTRAVENOUS
  Filled 2018-02-05: qty 597.6

## 2018-02-05 MED ORDER — VANCOMYCIN HCL IN DEXTROSE 750-5 MG/150ML-% IV SOLN
750.0000 mg | INTRAVENOUS | Status: DC
  Administered 2018-02-05 – 2018-02-06 (×2): 750 mg via INTRAVENOUS
  Filled 2018-02-05 (×3): qty 150

## 2018-02-05 NOTE — Progress Notes (Signed)
Pt mumbling today noted 02 Sats dropping to 88 % on 4 L n/c placed on Venti-mask ( Pt is a mouth breather) earlier to keep sats 90 to 91% More work of breathing - Given 1 UPC & 20 mg Lasix post transfusion Dr Cathlean Sauer called with update - he came to see pt and talked with her family by phone with updates

## 2018-02-05 NOTE — Progress Notes (Signed)
PHARMACY - ADULT TOTAL PARENTERAL NUTRITION CONSULT NOTE   Pharmacy Consult:  TPN Indication:  Prolonged ileus  Patient Measurements: Height: 5\' 6"  (167.6 cm) Weight: 171 lb 1.2 oz (77.6 kg) IBW/kg (Calculated) : 59.3 TPN AdjBW (KG): 63 Body mass index is 27.61 kg/m.  Assessment:  42 YOF presented on 01/31/2018 with N/V/D and abdominal pain, found to have pSBO. She was placed on a clear liquid diet on 01/21/18.  01/22/18 CT concerning for perforated appendicitis and patient may need surgery; therefore, she was made NPO again. Pharmacy consulted to manage TPN for ileus and prolonged NPO status.  Patient transferred to the ICU on 01/31/2018 s/p s/p ex-lap with LoA, SBR, appendectomy, drainage of abd abscess. Continue TPN with lipids given that patient has been on TPN since 01/23/18 and not a new start.  GI: cirrhosis (plan rifaxamin when can take po).  Prealbumin remains <5, LBM 4/27, abscess drain O/P 860ml>990ml, NG O/P 44ml down.  Pepcid IV, PRN Zofran. Vac change today  Endo: no hx DM - CBGs 132-192 Insulin requirements in the past 24 hours: 16 units SSI + 10 units in TPN  Lytes: Na 146, Cl 112,  K 3.5>4.7 up today,  Mag 2.4 (goal >/= 2), Phos 6.5, Ca 7.5 adjusts to 9.34  Renal: SCr 0.98 >2.07 up again, BUN up to 105 - UOP 0.7 ml/kg/hr, net 2.3L since admit, anasarca (Albumin 25g IV x 1 on 5/3) - 5/3 note: aggressive IV diuresis with lasix today, needs to be net negative, give IV Lasix  Pulm: Extubated  Cards: Rheumatic heart dz with mechanical valves, Afib:  IV lasix again 5/4, IV metoprolol, IV amiodarone started 5/1, off digoxin  AC: Coumadin PTA for Afib / mech valves >> Heparin, s/p Vit K - hgb 8.5>7.5 today, plts down to 98 - 5/2: Bleeding from femoral arterial line site. Transfuse 1 unit.  Hepatobil: LFTs / TG WNL, Tbili 3.2>2.5  Neuro:   ID: Zosyn for appendiceal abscess/enteritis, cx grew Actinomyces and Bacteroides -CXR shows bilateral effusions and ?of PNA vs. Pulmonary  edema. Afebrile, WBC 39.7>33.5>41.1 today  TPN Access: PICC placed 01/23/18, exchanged 5/3 TPN start date: 01/23/18  Nutritional Goals (per RD 5/3): 1500-1700 kCal,  77-96 g protein per day    Current Nutrition:  TPN   Plan:  Concentrate TPN - TPN at 45 ml/hr will provide 90g AA, 226.8g CHO and 32.4g ILE for a total of 1453 kCal, meeting 100% of patient's needs.  Electrolytes in TPN: Minimize Na, decrease K+, remove Mg, and Phos in TPN. Cl:Ac 1:2  Daily multivitamin and trace elements in TPN  Continue moderate SSI Q4H.  Continue 15 units of insulin in TPN with increased dextrose concentration in TPN .   D/C Pepcid bolus and add 40mg  to TPN  F/U AM labs, K+, Mg, and Phos  IV Lasix 100mg  IV x 1 today by MD    [name] Chung, PharmD, [place] Clinical Staff Pharmacist Pager [phone]  02/05/2018, 9:15 AM

## 2018-02-05 NOTE — Progress Notes (Signed)
Updated Elink that pt map is in the high 50's. Currently, 57 to 59 maps. SBP remains greater that 100. Per elink, will monitor in the setting of heart failure and kidney failure. Goal of SBP greater that 90. This RN will continue to monitor and report acute changes.

## 2018-02-05 NOTE — Progress Notes (Signed)
PROGRESS NOTE    [name] Chung  POL:[id number] DOB: 1933-09-13 DOA: 01/29/2018 PCP: Lajean Manes, MD    Brief Narrative:  84 year old female who presented with nausea, vomiting, abdominal pain and diarrhea.  Patient does have the significant past medical history of atrial fibrillation, rheumatic heart disease status post aortic valve replacement x3 and mitral valve replacement x2, (Saint Jude mechanical mitral valve and Medtronic freestyle aortic root conduit ), liver cirrhosis, congestive heart failure, and chronic kidney disease stage III.  Patient reported 3 days of abdominal pain, associated with distention and complicated by diarrhea.  She was treated with antibiotic therapy as an outpatient for a urinary tract infection.  On her initial physical examination blood pressure 116/52, heart rate 121, respiratory rate 20, oxygen saturation 92%.  Patient was awake and alert, heart S1-S2 present, rhythmic, lungs were clear to auscultation bilaterally, her abdomen was distended, diffusely tender but no rebound or guarding, positive lower extremity edema 2+ bilaterally.  CT of the abdomen with mild small bowel dilatation with air-fluid levels, suggestive of partial small bowel obstruction.   Patient was admitted to the hospital with the working diagnosis of partial small bowel obstruction, complicated by atrial fibrillation with rapid ventricular response.  Follow-up CT showed appendiceal abscess, she underwent drain placement by interventional radiology.  Unfortunately she continued to deteriorate despite percutaneous drainage, and underwent exploratory laparotomy, lysis of adhesions, small bowel resection, appendectomy, right salpingectomy and drainage of abdominal abscess on April 30.   Perioperatively patient developed shock, requiring vasopressors and invasive mechanical ventilation.   Extubated Feb 03, 2018, transferred to [facility] Feb 05, 2018.  Assessment & Plan:   Principal  Problem:   Partial small bowel obstruction (HCC) Active Problems:   Chronic atrial fibrillation (HCC)   Long term current use of anticoagulant therapy   S/P MVR (mitral valve replacement)   S/P AVR (aortic valve replacement)   Acute renal failure superimposed on stage 3 chronic kidney disease (HCC)   Atrial fibrillation with RVR (HCC)   Chronic systolic (congestive) heart failure (HCC)   Hyponatremia   Sepsis (HCC)   Liver cirrhosis (HCC)   Leukocytosis   Septic shock ([place])   Perforated appendicitis   Palliative care by specialist   Goals of care, counseling/discussion   Advanced care planning/counseling discussion   Ileus ([place])   Acute on chronic diastolic heart failure ([place])   Acute respiratory failure with hypoxia ([place])   1.  Appendicitis, complicated by appendiceal abscess, status post expiratory laparotomy. Noted bloody drainage from right upper quadrant drain, hb drop to 7,5 from 8.5, will order one unit of PRBC and continue close monitoring of cell count. Follow on surgery recommendations.   2.  Decompensated diastolic heart failure (acute on chronic), with acute cardiogenic pulmonary edema/ multifocal pneumonia with acute hypoxic respiratory failure. Patient with worsening oxygenation, less reactive, chest film personally reviewed with bilateral infiltrates despite diuresis, will continue oxygen supplementation to target 02 saturation above 92%. Non invasive mechanica ventilation if needed, avoid volume overload. Follow chest film in am. Continue Zosyn and will add IV Vancomycin.   3.  Acute kidney injury on chronic kidney disease. Worsening renal function with serum cr at 2,0 with K at 4,7 and serum bicarbonate at 27. Patient had furosemide yesterday with urine output 1,395 cc, Will continue using furosemide as blood pressure allows.   4. Atrial fibrillation. Will continue amiodarone infusion, today holding metoprolol due to hypotension, continue anticoagulation with heparin.    5.  Acute blood loss  anemia. PRBC transfusion today, will continue close monitoring of cell count,   6.  Metabolic encephalopathy. Patient is less reactive and poorly responsive, will continue aspiration precautions.   7.  Type 2 diabetes mellitus. Insulin sliding scale for glucose cover and monitoring, capillary glucose 142, 132, 127. On TPN.    DVT prophylaxis: scd  Code Status:  full Family Communication: I spoke with patient's stepdaughter at the bedside and all questions were addressed. Patient is poor prognosis and clinically deteriorating. Will consult palliative care,   Disposition Plan: critically ill   Patient critically ill, worsening mentation and respiratory failure, positive hypotension, continue supportive medical therapy with noninvasive mechanical ventilation, PRBC transfusion.  Critical care time 60 minutes  Consultants:   Cardiology   Surgery   Procedures:     Antimicrobials:    Zosyn   Vancomycin.    Subjective: This am patient is more confused and disorientated, no able to communicate, all history from patient's family and nursing at the bedside. Positive bloody output from drain. Looks in pain.   Objective: Vitals:   02/05/18 0602 02/05/18 0606 02/05/18 0608 02/05/18 0811  BP: (!) 110/39 (!) 110/37 (!) 112/34   Pulse: 80 91 88   Resp: (!) 21 (!) 22 (!) 25   Temp:      TempSrc:      SpO2: 91% 92% 92% 95%  Weight:      Height:        Intake/Output Summary (Last 24 hours) at 02/05/2018 0834 Last data filed at 02/05/2018 0800 Gross per 24 hour  Intake 2229.03 ml  Output 2340 ml  Net -110.97 ml   Filed Weights   02/03/18 0200 02/04/18 0359 02/05/18 0500  Weight: 77.8 kg (171 lb 8.3 oz) 77.4 kg (170 lb 10.2 oz) 77.6 kg (171 lb 1.2 oz)    Examination:   General: deconditioned and ill looking appearing Neurology: Awake, not interactive, not able to respond to questions or follow commands, right hand with mitten in place.  E ENT: positive  pallor, no icterus, oral mucosa dry with NG tube in place.  Cardiovascular: No JVD. S1-S2 present, rhythmic, no gallops, rubs. Systolic murmurs. Trace lower extremity edema. Pulmonary: vesicular breath sounds bilaterally, adequate air movement, no wheezing, rhonchi or rales. Gastrointestinal. Abdomen distended, large surgical wound in place, with drain at the right upper quadrant and lower quadrant. Bilateral groin hematoma.  Skin. No rashes Musculoskeletal: no joint deformities     Data Reviewed: I have personally reviewed following labs and imaging studies  CBC: Recent Labs  Lab 01/30/18 0512 01/31/18 0417  02/02/18 0453 02/03/18 0315 02/03/18 1617 02/03/18 2220 02/04/18 0324 02/05/18 0445  WBC 31.3* 36.1*   < > 37.2* 39.7* 37.5*  --  33.5* 41.1*  NEUTROABS 25.4* 28.8*  --  33.8*  --   --   --   --   --   HGB 7.3* 7.9*   < > 8.8* 8.3* 7.3* 8.9* 8.5* 7.5*  HCT 21.6* 24.9*   < > 26.8* 24.8* 22.5* 27.0* 25.5* 24.0*  MCV 108.0* 102.9*   < > 95.0 96.5 97.8  --  95.5 100.8*  PLT 174 173   < > 178 143* 129*  --  104* 98*   < > = values in this interval not displayed.   Basic Metabolic Panel: Recent Labs  Lab 01/31/18 0417  02/02/18 0453 02/03/18 0315 02/04/18 0324 02/04/18 1827 02/05/18 0445  NA 135   < > 142 143 144 149* 146*  K 5.0   < > 3.6 4.5 3.5 4.0 4.7  CL 101   < > 105 111 111 114* 112*  CO2 30   < > 30 27 26 27 27   GLUCOSE 191*   < > 272* 152* 173* 155* 141*  BUN 43*   < > 50* 72* 86* 92* 105*  CREATININE 1.07*   < > 0.98 1.49* 1.64* 1.77* 2.07*  CALCIUM 8.1*   < > 8.2* 7.8* 7.6* 7.7* 7.5*  MG 2.2  --   --  1.9  --   --  2.4  PHOS 4.0  --   --  4.4  --   --  6.5*   < > = values in this interval not displayed.   GFR: Estimated Creatinine Clearance: 21.7 mL/min (A) (by C-G formula based on SCr of 2.07 mg/dL (H)). Liver Function Tests: Recent Labs  Lab 01/31/18 0417 01/11/2018 1710 02/02/18 0453  AST 42* 30 29  ALT 12* 12* 12*  ALKPHOS 69 43 45  BILITOT  1.8* 3.2* 2.5*  PROT 6.5 4.5* 4.4*  ALBUMIN 1.8* 1.8* 1.7*   No results for input(s): LIPASE, AMYLASE in the last 168 hours. No results for input(s): AMMONIA in the last 168 hours. Coagulation Profile: Recent Labs  Lab 01/27/2018 1710 02/02/18 0453 02/03/18 0315 02/04/18 0324 02/05/18 0445  INR 1.73 1.66 1.83 1.81 1.79   Cardiac Enzymes: No results for input(s): CKTOTAL, CKMB, CKMBINDEX, TROPONINI in the last 168 hours. BNP (last 3 results) No results for input(s): PROBNP in the last 8760 hours. HbA1C: No results for input(s): HGBA1C in the last 72 hours. CBG: Recent Labs  Lab 02/04/18 1541 02/04/18 1954 02/05/18 0039 02/05/18 0430 02/05/18 0800  GLUCAP 178* 142* 170* 132* 128*   Lipid Profile: No results for input(s): CHOL, HDL, LDLCALC, TRIG, CHOLHDL, LDLDIRECT in the last 72 hours. Thyroid Function Tests: No results for input(s): TSH, T4TOTAL, FREET4, T3FREE, THYROIDAB in the last 72 hours. Anemia Panel: No results for input(s): VITAMINB12, FOLATE, FERRITIN, TIBC, IRON, RETICCTPCT in the last 72 hours.    Radiology Studies: I have reviewed all of the imaging during this hospital visit personally     Scheduled Meds: . chlorhexidine  15 mL Mouth Rinse BID  . Chlorhexidine Gluconate Cloth  6 each Topical Daily  . insulin aspart  0-15 Units Subcutaneous Q4H  . mouth rinse  15 mL Mouth Rinse q12n4p  . metoprolol tartrate  2.5 mg Intravenous Q8H  . sodium chloride flush  10-40 mL Intracatheter Q12H  . sodium chloride HYPERTONIC  4 mL Nebulization BID   Continuous Infusions: . sodium chloride 10 mL/hr at 02/05/18 0600  . amiodarone 30 mg/hr (02/05/18 0600)  . heparin 1,950 Units/hr (02/05/18 0600)  . piperacillin-tazobactam (ZOSYN)  IV 3.375 g (02/05/18 0740)  . TPN ADULT (ION) 45 mL/hr at 02/05/18 0600     LOS: 17 days        Kaloni Bisaillon Gerome Apley, MD [facility] Pager [phone]

## 2018-02-05 NOTE — Progress Notes (Signed)
Patients brother here - text page to Dr Cathlean Sauer - DR called back spoke to brother. Pt is now a DNR - to continue Bipap - orders received.

## 2018-02-05 NOTE — Progress Notes (Signed)
ANTICOAGULATION and Antibiotic CONSULT NOTE  Pharmacy Consult for Heparin and vancomycin Indication: atrial fibrillation and mechanical AVR & MVR and possible PNA  Patient Measurements: Height: 5\' 6"  (167.6 cm) Weight: 171 lb 1.2 oz (77.6 kg) IBW/kg (Calculated) : 59.3 Heparin Dosing Weight: 58 kg  Vital Signs: Temp: 98 F (36.7 C) (05/04 0950) Temp Source: Axillary (05/04 0950) BP: 115/43 (05/04 0900) Pulse Rate: 81 (05/04 0900)  Labs: Recent Labs    02/03/18 0315  02/03/18 1617 02/03/18 2220 02/04/18 0324 02/04/18 1047 02/04/18 1827 02/05/18 0445  HGB 8.3*  --  7.3* 8.9* 8.5*  --   --  7.5*  HCT 24.8*  --  22.5* 27.0* 25.5*  --   --  24.0*  PLT 143*  --  129*  --  104*  --   --  98*  LABPROT 21.0*  --   --   --  20.9*  --   --  20.6*  INR 1.83  --   --   --  1.81  --   --  1.79  HEPARINUNFRC 0.64   < >  --  <0.10*  --  0.32  --  0.30  CREATININE 1.49*  --   --   --  1.64*  --  1.77* 2.07*   < > = values in this interval not displayed.    Estimated Creatinine Clearance: 21.7 mL/min (A) (by C-G formula based on SCr of 2.07 mg/dL (H)).  Assessment: Warfarin PTA for afib and mechanical valve. INR 1.81>1.79. Plt down to 98K and Hgb 7.5. Heparin level remains therapeutic at 0.30. Patient is having some light bleeding and getting a unit of blood per nursing. Will continue drip and monitor status.  Pt was on vancomycin from 4/26-4/30 and has been on zosyn since 4/18. Vancomycin to be restarted for possible pneumonia. Pt is on TPN, WBC have been consistently elevated (now 41), and pt is afebrile. Scr trending up (1.49>2.07).   Goal of Therapy:  Heparin level 0.3-0.5 units/ml Monitor platelets by anticoagulation protocol: Yes   Plan:  Continue IV heparin at 1950 units/hr  Daily heparin level and CBC Monitor bleeding  Vancomycin 750mg  Q24hrs Zosyn 3.375g IV Q8hrs Monitor clinical status, LOT, and renal function   Patterson Hammersmith PharmD PGY1 Pharmacy Practice  Resident 02/05/2018 9:54 AM Pager: [phone] Phone: [phone]

## 2018-02-05 NOTE — Progress Notes (Signed)
[facility]/Trauma Progress Note  4 Days Post-Op   Assessment/Plan Status post MVR and AVR on Coumadin Cirrhosis with portal HTN  Perforated appendicitis with abscess versus neoplasia.  S/p ex-lap, appendectomy, SB resection, R salpingectomy, drain, VAC 01/22/2018 Dr. Georgette Dover -S/p perc drain 4/24- purulent output - POD#4 - WBC 41.1 up from 33.5 ,afebrile - drain 1 with 770 cc in 24h, lower drain with 20 cc in 24h- continue drain and IV abx - await bowel function - VAC changes M,W,F Acute on Chronic Diastolic CHF - cardiology recommendations noted, giving albumin bolus and continue lasix - CXR shows bilateral effusions and ?of PNA vs. Pulmonary edema - Cr elevated to 2.07 today  FEN: TPN, NPO with ice chips; IV morphine for pain control VTE: heparin gtt ID: PO rifaximin 4/17>>, vancomycin 4/27>4/30; 05/04>>, Zosyn 4/20-05/06  Plan: NGT with minimal output. Continue drains and IV abx. Pt may need CT scan to evaluate for intra-abdominal abscess with increase in WBC and change in mental status. Will discuss with MD.    LOS: 17 days    Subjective:  CC: sepsis  Pt is not responding verbally to questions. She is mouth breathing and moaning. Caregiver at bedside. No family at bedside.   Objective: Vital signs in last 24 hours: Temp:  [97.7 F (36.5 C)-98.3 F (36.8 C)] 98.1 F (36.7 C) (05/04 1018) Pulse Rate:  [73-100] 95 (05/04 1100) Resp:  [12-37] 25 (05/04 1100) BP: (81-150)/(29-77) 105/38 (05/04 1100) SpO2:  [87 %-97 %] 92 % (05/04 1100) FiO2 (%):  [50 %] 50 % (05/04 1018) Weight:  [77.6 kg (171 lb 1.2 oz)] 77.6 kg (171 lb 1.2 oz) (05/04 0500) Last BM Date: 01/29/18  Intake/Output from previous day: 05/03 0701 - 05/04 0700 In: 2401.4 [I.V.:2061.4; IV Piggyback:335] Out: 2460 [Urine:1395; Emesis/NG output:75; Drains:990] Intake/Output this shift: Total I/O In: 324.8 [I.V.:324.8] Out: 140 [Urine:30; Drains:110]  PE: AYT:KZSWF, in obvious  discomfort, moaning Card:Very edematous Pulm:rate and effort increased, on O2 mask Abd: Soft, wound vac in place. IR drain with minimal output, surgical drain with serosanguinous output, bowel sounds hypoactive, moderate distention Skin: warm and dry, no rashes Neuro: not following commands  Anti-infectives: Anti-infectives (From admission, onward)   Start     Dose/Rate Route Frequency Ordered Stop   02/05/18 1100  vancomycin (VANCOCIN) IVPB 750 mg/150 ml premix     750 mg 150 mL/hr over 60 Minutes Intravenous Every 24 hours 02/05/18 0948     01/29/18 1000  vancomycin (VANCOCIN) 500 mg in sodium chloride 0.9 % 100 mL IVPB  Status:  Discontinued     500 mg 100 mL/hr over 60 Minutes Intravenous Every 12 hours 01/28/18 1411 01/29/2018 0812   01/28/18 1500  vancomycin (VANCOCIN) IVPB 1000 mg/200 mL premix     1,000 mg 200 mL/hr over 60 Minutes Intravenous  Once 01/28/18 1411 01/29/18 0102   01/22/18 1600  piperacillin-tazobactam (ZOSYN) IVPB 3.375 g     3.375 g 12.5 mL/hr over 240 Minutes Intravenous Every 8 hours 01/22/18 1014 02/28/2018 0936   01/20/18 1500  piperacillin-tazobactam (ZOSYN) IVPB 2.25 g  Status:  Discontinued     2.25 g 100 mL/hr over 30 Minutes Intravenous Every 6 hours 01/20/18 1211 01/22/18 1014   01/20/18 0500  piperacillin-tazobactam (ZOSYN) IVPB 2.25 g  Status:  Discontinued     2.25 g 100 mL/hr over 30 Minutes Intravenous Every 6 hours 01/18/2018 2220 01/20/18 1211   01/11/2018 2330  rifaximin (XIFAXAN) tablet 550 mg  Status:  Discontinued  550 mg Oral 2 times daily 01/04/2018 2224 02/02/18 0914   01/21/2018 2230  piperacillin-tazobactam (ZOSYN) IVPB 3.375 g     3.375 g 100 mL/hr over 30 Minutes Intravenous  Once 01/18/2018 2220 01/16/2018 2300      Lab Results:  Recent Labs    02/04/18 0324 02/05/18 0445  WBC 33.5* 41.1*  HGB 8.5* 7.5*  HCT 25.5* 24.0*  PLT 104* 98*   BMET Recent Labs    02/04/18 1827 02/05/18 0445  NA 149* 146*  K 4.0 4.7  CL 114*  112*  CO2 27 27  GLUCOSE 155* 141*  BUN 92* 105*  CREATININE 1.77* 2.07*  CALCIUM 7.7* 7.5*   PT/INR Recent Labs    02/04/18 0324 02/05/18 0445  LABPROT 20.9* 20.6*  INR 1.81 1.79   CMP     Component Value Date/Time   NA 146 (H) 02/05/2018 0445   NA 136 01/04/2018 1416   K 4.7 02/05/2018 0445   CL 112 (H) 02/05/2018 0445   CO2 27 02/05/2018 0445   GLUCOSE 141 (H) 02/05/2018 0445   BUN 105 (H) 02/05/2018 0445   BUN 16 01/04/2018 1416   CREATININE 2.07 (H) 02/05/2018 0445   CREATININE 0.67 03/13/2015 1419   CALCIUM 7.5 (L) 02/05/2018 0445   PROT 4.4 (L) 02/02/2018 0453   PROT 7.3 12/27/2017 1423   ALBUMIN 1.7 (L) 02/02/2018 0453   ALBUMIN 3.2 (L) 12/27/2017 1423   AST 29 02/02/2018 0453   ALT 12 (L) 02/02/2018 0453   ALKPHOS 45 02/02/2018 0453   BILITOT 2.5 (H) 02/02/2018 0453   BILITOT 0.9 12/27/2017 1423   GFRNONAA 21 (L) 02/05/2018 0445   GFRAA 24 (L) 02/05/2018 0445   Lipase     Component Value Date/Time   LIPASE 46 01/31/2018 1544    Studies/Results: Dg Chest Port 1 View  Result Date: 02/05/2018 CLINICAL DATA:  Pulmonary edema EXAM: PORTABLE CHEST 1 VIEW COMPARISON:  Chest radiograph from one day prior. FINDINGS: Enteric tube enters stomach with the tip not seen on this image. Intact sternotomy wires. Right PICC terminates over the right atrium. Stable cardiomediastinal silhouette with mild cardiomegaly. No pneumothorax. Small right pleural effusion is stable. Trace left pleural effusion is stable. Extensive patchy opacities throughout both lungs, most prominent in the left upper lung, not appreciably changed. IMPRESSION: 1. No appreciable change in extensive patchy bilateral lung opacities, which could represent severe pulmonary edema and/or multilobar pneumonia. 2. Stable small right and trace left pleural effusions with mild cardiomegaly. Electronically Signed   By: Ilona Sorrel M.D.   On: 02/05/2018 09:08   Dg Chest Port 1 View  Result Date:  02/04/2018 CLINICAL DATA:  PICC line in place. EXAM: PORTABLE CHEST 1 VIEW COMPARISON:  One-view chest x-ray from the same day at 5:13 a.m. FINDINGS: The heart is enlarged. NG tube courses off the inferior border of the film. A left IJ line terminates at the mid SVC. A right-sided PICC line terminates at the cavoatrial junction. Diffuse interstitial and airspace disease is again seen bilaterally. Right greater than left pleural effusions are noted. There is no significant interval change. IMPRESSION: 1. Diffuse interstitial and airspace disease bilaterally compatible with multi lobar pneumonia or ARDS. 2. Right greater than left pleural effusions and associated basilar airspace disease. Electronically Signed   By: San Morelle M.D.   On: 02/04/2018 18:13   Dg Chest Port 1 View  Result Date: 02/04/2018 CLINICAL DATA:  Acute respiratory failure with hypoxia. EXAM: PORTABLE CHEST 1 VIEW  COMPARISON:  Radiograph of Feb 03, 2018. FINDINGS: Stable cardiomegaly. Atherosclerosis of thoracic aorta is noted. Status post aortic valve replacement. Nasogastric tube is seen entering the stomach. Left internal jugular catheter tip is seen in SVC. Right-sided PICC line is unchanged in position. No pneumothorax is noted. Increased bilateral lung opacities are noted concerning for worsening pneumonia or edema, particularly in the left upper lobe. Bilateral pleural effusions are noted. Bony thorax is unremarkable. IMPRESSION: Mildly increased bilateral lung opacities are noted, consistent with worsening pneumonia or possibly edema, particularly in the left upper lobe. Bilateral pleural effusions are again noted. Stable support apparatus. Electronically Signed   By: Marijo Conception, M.D.   On: 02/04/2018 07:28   [place] Ekg Site Rite  Result Date: 02/03/2018 If Site Rite image not attached, placement could not be confirmed due to current cardiac rhythm.     Kalman Drape , [facility] 02/05/2018, 11:59  AM  Pager: [phone] Mon-Wed, Friday 7:00am-4:30pm Thurs 7am-11:30am  Consults: [phone]

## 2018-02-05 NOTE — Progress Notes (Signed)
Progress Note   Subjective   Very ill.  Not responsive.  Agitated and with increased WOB.  Inpatient Medications    Scheduled Meds: . chlorhexidine  15 mL Mouth Rinse BID  . Chlorhexidine Gluconate Cloth  6 each Topical Daily  . furosemide      . furosemide  20 mg Intravenous Once  . insulin aspart  0-15 Units Subcutaneous Q4H  . mouth rinse  15 mL Mouth Rinse q12n4p  . metoprolol tartrate  2.5 mg Intravenous Q8H  . sodium chloride flush  10-40 mL Intracatheter Q12H  . sodium chloride HYPERTONIC  4 mL Nebulization BID   Continuous Infusions: . sodium chloride 10 mL/hr at 02/05/18 0600  . amiodarone 30 mg/hr (02/05/18 1105)  . heparin 1,950 Units/hr (02/05/18 1252)  . piperacillin-tazobactam (ZOSYN)  IV Stopped (02/05/18 1140)  . TPN ADULT (ION) 45 mL/hr at 02/05/18 0600  . TPN ADULT (ION)    . vancomycin 750 mg (02/05/18 1200)   PRN Meds: hydrALAZINE, morphine injection, ondansetron (ZOFRAN) IV, sodium chloride flush   Vital Signs    Vitals:   02/05/18 1002 02/05/18 1018 02/05/18 1100 02/05/18 1200  BP: (!) 109/44 (!) 116/44 (!) 105/38 (!) 98/45  Pulse: 83 84 95 93  Resp: (!) 24 (!) 37 (!) 25 (!) 35  Temp:  98.1 F (36.7 C) 98.4 F (36.9 C)   TempSrc: Axillary Axillary Axillary   SpO2: 91% (!) 87% 92% 92%  Weight:      Height:        Intake/Output Summary (Last 24 hours) at 02/05/2018 1256 Last data filed at 02/05/2018 1100 Gross per 24 hour  Intake 2195.23 ml  Output 2040 ml  Net 155.23 ml   Filed Weights   02/03/18 0200 02/04/18 0359 02/05/18 0500  Weight: 171 lb 8.3 oz (77.8 kg) 170 lb 10.2 oz (77.4 kg) 171 lb 1.2 oz (77.6 kg)    Telemetry    Afib, V rates stable - Personally Reviewed  Physical Exam   GEN- The patient is in extremis.  Not interactive.  Very agitated and tachypneic  Head- normocephalic, atraumatic Eyes-  Sclera clear, conjunctiva pink Ears- does not respond to my voice Oropharynx- clear Neck- supple, Lungs- tachypneic, coarse  BS Heart- irregular rate and rhythm , mechanical S1 GI- soft, NT, ND, + BS Extremities- no clubbing, cyanosis, + 3 edema  (dependant) MS- diffuse atrophy Skin- no rash or lesion Psych- agitated, uncomfortable appearing Neuro- not interacting   Labs    Chemistry Recent Labs  Lab 01/31/18 0417  01/31/2018 1710 02/02/18 0453  02/04/18 0324 02/04/18 1827 02/05/18 0445  NA 135   < > 144 142   < > 144 149* 146*  K 5.0   < > 3.4* 3.6   < > 3.5 4.0 4.7  CL 101   < > 107 105   < > 111 114* 112*  CO2 30   < > 28 30   < > 26 27 27   GLUCOSE 191*   < > 179* 272*   < > 173* 155* 141*  BUN 43*   < > 45* 50*   < > 86* 92* 105*  CREATININE 1.07*   < > 0.91 0.98   < > 1.64* 1.77* 2.07*  CALCIUM 8.1*   < > 8.3* 8.2*   < > 7.6* 7.7* 7.5*  PROT 6.5  --  4.5* 4.4*  --   --   --   --   ALBUMIN 1.8*  --  1.8* 1.7*  --   --   --   --   AST 42*  --  30 29  --   --   --   --   ALT 12*  --  12* 12*  --   --   --   --   ALKPHOS 69  --  43 45  --   --   --   --   BILITOT 1.8*  --  3.2* 2.5*  --   --   --   --   GFRNONAA 47*   < > 57* 52*   < > 28* 25* 21*  GFRAA 54*   < > >60 >60   < > 32* 29* 24*  ANIONGAP 4*   < > 9 7   < > 7 8 7    < > = values in this interval not displayed.     Hematology Recent Labs  Lab 02/03/18 1617 02/03/18 2220 02/04/18 0324 02/05/18 0445  WBC 37.5*  --  33.5* 41.1*  RBC 2.30*  --  2.67* 2.38*  HGB 7.3* 8.9* 8.5* 7.5*  HCT 22.5* 27.0* 25.5* 24.0*  MCV 97.8  --  95.5 100.8*  MCH 31.7  --  31.8 31.5  MCHC 32.4  --  33.3 31.3  RDW 19.5*  --  19.3* 20.9*  PLT 129*  --  104* 98*    Cardiac EnzymesNo results for input(s): TROPONINI in the last 168 hours. No results for input(s): TROPIPOC in the last 168 hours.     Patient Profile     84 y.o. female with a history ofrheumatic valvular heart disease status post redo aortic valve replacement x3 and redo mitral valve surgery x2, persistent atrial fibrillation, coumadin for anticoagulation, chronic LE edema  (multifactorial with diastolic CHF, venous insufficiency, cirrhosis)now admitted withperforated appendix withSBO. We are assisting with management of diastolic heart failure and atrial fibrillation.    Assessment & Plan    The patient is currently critically ill with multiorgan failure/ sepsis.  She is not interacting purposefully and appears very uncomfortable.  She is in extremis.  Her prognosis is currently very poor.  I would advise palliative options.  I agree with Dr Cathlean Sauer that immediate discussions of code status are necessary.  I do not feel that more aggressive interventions would alter her coarse.  Hopefully palliative options will be pursued so that ultimately, we can focus on her comfort.  No new CV recommendations.  Our options are currently very limited. Continue heparin as her anemia allows.  Cardiology to follow with you.  Thompson Grayer MD, [facility] 02/05/2018 12:56 PM

## 2018-02-05 NOTE — Progress Notes (Addendum)
Patient is clinically deteriorating, she somnolent, lethargic, poorly responsive, bilateral rhonchi on examination, her systolic blood pressure is low in the 90s.  We will proceed with IV furosemide 20 mg now, and place patient on BiPAP.  I spoke with patient's stepdaughter over the phone at the bedside and explained the patient's condition, poor prognosis and clinical deterioration.  I addressed the CODE STATUS.  She will discuss it with the rest of the family.  For now patient continued to be full code.

## 2018-02-05 NOTE — Progress Notes (Addendum)
I spoke with patient's son and explained him the patient's condition, poor prognosis and treatment options. Patient's family have decided to change code status to DNR, will continue current therapy with no further escalation. Will focus care on patient's comfort. Palliative care team will be reconsulted.

## 2018-02-05 NOTE — Progress Notes (Signed)
Resp here to place pt on Bipap.

## 2018-02-06 ENCOUNTER — Other Ambulatory Visit: Payer: Self-pay

## 2018-02-06 DIAGNOSIS — T17908A Unspecified foreign body in respiratory tract, part unspecified causing other injury, initial encounter: Secondary | ICD-10-CM

## 2018-02-06 DIAGNOSIS — N7093 Salpingitis and oophoritis, unspecified: Secondary | ICD-10-CM

## 2018-02-06 DIAGNOSIS — Z741 Need for assistance with personal care: Secondary | ICD-10-CM

## 2018-02-06 DIAGNOSIS — E43 Unspecified severe protein-calorie malnutrition: Secondary | ICD-10-CM

## 2018-02-06 LAB — BPAM RBC
Blood Product Expiration Date: 201905092359
Blood Product Expiration Date: 201905222359
ISSUE DATE / TIME: 201905021834
ISSUE DATE / TIME: 201905040956
UNIT TYPE AND RH: 6200
UNIT TYPE AND RH: 6200

## 2018-02-06 LAB — TYPE AND SCREEN
ABO/RH(D): A POS
ANTIBODY SCREEN: NEGATIVE
UNIT DIVISION: 0
Unit division: 0

## 2018-02-06 LAB — PROTIME-INR
INR: 1.99
Prothrombin Time: 22.4 seconds — ABNORMAL HIGH (ref 11.4–15.2)

## 2018-02-06 LAB — CBC
HEMATOCRIT: 24.8 % — AB (ref 36.0–46.0)
Hemoglobin: 7.7 g/dL — ABNORMAL LOW (ref 12.0–15.0)
MCH: 30.8 pg (ref 26.0–34.0)
MCHC: 31 g/dL (ref 30.0–36.0)
MCV: 99.2 fL (ref 78.0–100.0)
PLATELETS: 98 10*3/uL — AB (ref 150–400)
RBC: 2.5 MIL/uL — ABNORMAL LOW (ref 3.87–5.11)
RDW: 22 % — AB (ref 11.5–15.5)
WBC: 46 10*3/uL — ABNORMAL HIGH (ref 4.0–10.5)

## 2018-02-06 LAB — BASIC METABOLIC PANEL
Anion gap: 8 (ref 5–15)
BUN: 136 mg/dL — AB (ref 6–20)
CALCIUM: 7.3 mg/dL — AB (ref 8.9–10.3)
CO2: 26 mmol/L (ref 22–32)
Chloride: 112 mmol/L — ABNORMAL HIGH (ref 101–111)
Creatinine, Ser: 2.84 mg/dL — ABNORMAL HIGH (ref 0.44–1.00)
GFR, EST AFRICAN AMERICAN: 17 mL/min — AB (ref >60)
GFR, EST NON AFRICAN AMERICAN: 14 mL/min — AB (ref >60)
Glucose, Bld: 135 mg/dL — ABNORMAL HIGH (ref 65–99)
Potassium: 5.2 mmol/L — ABNORMAL HIGH (ref 3.5–5.1)
SODIUM: 146 mmol/L — AB (ref 135–145)

## 2018-02-06 LAB — HEPATIC FUNCTION PANEL
ALK PHOS: 59 U/L (ref 38–126)
ALT: 16 U/L (ref 14–54)
AST: 44 U/L — ABNORMAL HIGH (ref 15–41)
Albumin: 1.6 g/dL — ABNORMAL LOW (ref 3.5–5.0)
BILIRUBIN INDIRECT: 1.1 mg/dL — AB (ref 0.3–0.9)
Bilirubin, Direct: 0.8 mg/dL — ABNORMAL HIGH (ref 0.1–0.5)
TOTAL PROTEIN: 4.7 g/dL — AB (ref 6.5–8.1)
Total Bilirubin: 1.9 mg/dL — ABNORMAL HIGH (ref 0.3–1.2)

## 2018-02-06 LAB — GLUCOSE, CAPILLARY
GLUCOSE-CAPILLARY: 134 mg/dL — AB (ref 65–99)
GLUCOSE-CAPILLARY: 121 mg/dL — AB (ref 65–99)
GLUCOSE-CAPILLARY: 125 mg/dL — AB (ref 65–99)

## 2018-02-06 LAB — HEPARIN LEVEL (UNFRACTIONATED): HEPARIN UNFRACTIONATED: 0.62 [IU]/mL (ref 0.30–0.70)

## 2018-02-06 LAB — MAGNESIUM: MAGNESIUM: 2.5 mg/dL — AB (ref 1.7–2.4)

## 2018-02-06 LAB — PHOSPHORUS: PHOSPHORUS: 6.7 mg/dL — AB (ref 2.5–4.6)

## 2018-02-06 LAB — LIPASE, BLOOD: LIPASE: 56 U/L — AB (ref 11–51)

## 2018-02-06 MED ORDER — MORPHINE 100MG IN NS 100ML (1MG/ML) PREMIX INFUSION
1.0000 mg/h | INTRAVENOUS | Status: DC
  Filled 2018-02-06: qty 100

## 2018-02-06 MED ORDER — MORPHINE 100MG IN NS 100ML (1MG/ML) PREMIX INFUSION
1.0000 mg/h | INTRAVENOUS | Status: DC
  Administered 2018-02-06: 1 mg/h via INTRAVENOUS
  Filled 2018-02-06 (×2): qty 100

## 2018-02-06 MED ORDER — HEPARIN (PORCINE) IN NACL 100-0.45 UNIT/ML-% IJ SOLN: 1800.0000 [IU]/h | INTRAMUSCULAR | Status: DC

## 2018-02-07 NOTE — Progress Notes (Signed)
75 ml of Fentanyl wasted in sink with Johnnye Sima, RN.   Milford Cage, RN

## 2018-03-05 NOTE — Death Summary Note (Signed)
Death Summary  [name] Chung KZS:[id number] DOB: 02/21/1933 DOA: 2018/02/17  PCP: Lajean Manes, MD  Admit date: 02-17-18 Date of Death: Mar 07, 2018 Time of Death:  Notification: Lajean Manes, MD notified of death of 2018-03-10   History of present illness:  [name] Chung is a 84 y.o. female with a history of atrial fibrillation, rheumatic heart disease status post aortic valve replacement x3 and mitral valve replacement x2,(Saint Jude mechanical mitral valve and Medtronic freestyle aortic root conduit),liver cirrhosis, congestive heart failure,andchronic kidney disease stage III.  [name] Chung presented with complaint of 3 days of abdominal pain, associated with abdominal distention and complicated by diarrhea. She was treated with antibiotic therapy as an outpatient for a urinary tract infection. On her initial physical examination blood pressure 116/52, heart rate 121, respiratory rate 20, oxygen saturation 92%.Patient was awake and alert, heart S1-S2 present, irregularly irregular, lungs wereclear to auscultation bilaterally, her abdomen was distended, diffusely tender but no rebound or guarding, positive lower extremity edema 2+ bilaterally. CT of the abdomen with mild small bowel dilatation with air-fluid levels,suggestive of partial small bowel obstruction.  Patient was admitted to the hospital withtheworking diagnosis of partial small bowel obstruction,complicated by atrial fibrillation with rapid ventricular response.  Follow-up CT showed appendiceal abscess, and she underwent drain placement by interventional radiology.Unfortunately she continued to deteriorate despite percutaneous drainage, andunderwent exploratory laparotomy, lysis of adhesions, small bowel resection, appendectomy, right salpingectomyand drainage of abdominal abscess on April 30.  Perioperatively patient developed shock, requiring vasopressors and invasive mechanical ventilation.Patient was  extubated on May 2nd 2019.   Patient continued to deteriorate, she developed worsening metabolic encephalopathy, acute kidney injury, hypoxic respiratory failure with suspected multifocal pneumonia, and multiorgan failure   [name] Chung did not improve after aggressive medical therapy including noninvasive mechanical ventilation and broad-spectrum antibiotic therapy.   Patient's family was approached, explained in detail patient's condition and poor prognosis, decision was made to allow natural death, following patient's advance directives.   Final Diagnoses:  1.   Multiorgan failure 2.   Hypoxic respiratory failure due to multifocal pneumonia, ARDS 3.   Metabolic encephalopathy 4.   Acute kidney injury 5.   Acute blood loss anemia 6.   Appendiceal abscess.    The results of significant diagnostics from this hospitalization (including imaging, microbiology, ancillary and laboratory) are listed below for reference.    Significant Diagnostic Studies: Ct Abdomen Pelvis Wo Contrast  Result Date: 2018-02-17 CLINICAL DATA:  Abdominal distention with nausea vomiting diarrhea. Weight loss EXAM: CT ABDOMEN AND PELVIS WITHOUT CONTRAST TECHNIQUE: Multidetector CT imaging of the abdomen and pelvis was performed following the standard protocol without IV contrast. COMPARISON:  CT abdomen pelvis 07/20/2017 FINDINGS: Lower chest: Small to moderate right effusion with progression. Right lower lobe atelectasis with progression. No significant left effusion. Cardiac enlargement Hepatobiliary: Enlargement left lobe liver with irregular contour liver suggesting cirrhosis. Multiple gallstones without gallbladder wall thickening or biliary dilatation Pancreas: Negative Spleen: Negative Adrenals/Urinary Tract: Negative for renal stone or obstruction. Normal urinary bladder. No renal mass on unenhanced images. Stomach/Bowel: Small bowel is mildly dilated with air-fluid levels. Colon decompressed. Dilated varices in  the left upper quadrant unchanged from the prior study likely due to portal hypertension. No bowel edema Vascular/Lymphatic: Atherosclerotic disease without aneurysm of the aorta Reproductive: Hysterectomy.  No pelvic mass Other: Negative for ascites. Musculoskeletal: No acute skeletal abnormality. IMPRESSION: 1. Findings consistent with cirrhosis and portal hypertension with varices. No ascites. 2. Cholelithiasis without cholecystitis 3. Mild small bowel  dilatation with air-fluid levels which may be due to partial small bowel obstruction. Colon decompressed. Electronically Signed   By: Franchot Gallo M.D.   On: 01/31/2018 20:14   Dg Chest 1 View  Result Date: 01/03/2018 CLINICAL DATA:  84 year old female with evidence of small bowel obstruction in the pelvis on CT. Abnormal abdominal and pelvic fluid collections, percutaneous drainage catheter placed. NG tube may have been pulled back. EXAM: CHEST  1 VIEW COMPARISON:  KUB 01/31/2018 and earlier. FINDINGS: Portable AP semi upright view at 0815 hours. Enteric tube courses to the abdomen as before, tip not included. Veiling opacity persists at the lung bases greater on the right. New coarse and confluent left mid and upper lung opacity since 01/28/2018. No definite new opacity in the right lung. No pneumothorax identified. Stable cardiomegaly and mediastinal contours. Prior valve replacement. IMPRESSION: 1. New confluent left upper lung opacity suspicious for a New Left Lung Pneumonia since 01/28/2018. 2. Continued right greater than left lower lung veiling opacities seen to reflect pleural effusions and atelectasis on the most recent CT Abdomen and Pelvis. Electronically Signed   By: Genevie Ann M.D.   On: 01/27/2018 10:03   Dg Chest 2 View  Result Date: 01/20/2018 CLINICAL DATA:  Systemic inflammatory response syndrome, shortness of breath since having valve replacement surgery EXAM: CHEST - 2 VIEW COMPARISON:  01/17/2015 FINDINGS: Enlargement of cardiac  silhouette with pulmonary vascular congestion post median sternotomy, AVR and MVR. Atherosclerotic calcification aorta. Prominent central pulmonary arteries. Mild interstitial edema with RIGHT pleural effusion and basilar atelectasis. Underlying emphysematous changes. No pneumothorax. Bones demineralized. IMPRESSION: Enlargement of cardiac silhouette with pulmonary vascular congestion post AVR and MVR. COPD changes with superimposed mild pulmonary edema, RIGHT pleural effusion and RIGHT basilar atelectasis. Electronically Signed   By: Lavonia Dana M.D.   On: 01/20/2018 14:19   Ct Abdomen Pelvis W Contrast  Result Date: 01/30/2018 CLINICAL DATA:  Patient with abdominal pain. Evaluate for bowel obstruction. EXAM: CT ABDOMEN AND PELVIS WITH CONTRAST TECHNIQUE: Multidetector CT imaging of the abdomen and pelvis was performed using the standard protocol following bolus administration of intravenous contrast. CONTRAST:  11mL ISOVUE-300 IOPAMIDOL (ISOVUE-300) INJECTION 61% COMPARISON:  CT abdomen pelvis 01/22/2018; drainage CT 01/26/2018 FINDINGS: Lower chest: Heart is enlarged. Moderate right and small left pleural effusions with underlying pulmonary consolidation. Hepatobiliary: The liver is lobular in contour. Similar-appearing subcentimeter low-attenuation lesion right hepatic lobe (image 20; series 3). Multiple gallstones. No intrahepatic or extrahepatic biliary ductal dilatation. Pancreas: Unremarkable Spleen: Unremarkable Adrenals/Urinary Tract: Adrenal glands are normal. Kidneys enhance symmetrically with contrast. No hydronephrosis. Bladder is drained with a Foley catheter. Stomach/Bowel: Mild duodenal and proximal small bowel wall thickening. There is small bowel dilatation most notable within the mid to distal small bowel measuring up to 4.8 cm (image 73; series 3). There is transition to decompressed small bowel within the central lower abdomen. Small amount of free fluid within the upper abdomen  surrounding the liver and spleen. There is wall thickening of the duodenum and proximal small bowel. No definite free intraperitoneal air. Vascular/Lymphatic: Peripheral calcified atherosclerotic plaque. No retroperitoneal lymphadenopathy. Multiple collateral vessels within the left upper hemiabdomen compatible with portal venous hypertension. Reproductive: Status post hysterectomy. Other: Anasarca. Right lower quadrant pigtail drainage catheter is demonstrated. This is located centrally and has drained one of the previously described right lower quadrant fluid collections. There is an additional complex cystic collection within the right pelvic sidewall which measures 3.9 x 3.6 cm (image 64; series 3). There  is a loculated fluid collection within the pelvis which measures 3.9 x 7.2 cm (image 73; series 3). Musculoskeletal: Lumbar spine degenerative changes. There is expansion of the left upper inner muscle (image 84; series 3). IMPRESSION: 1. The small bowel is markedly dilated with transition to decompressed small bowel within the pelvis concerning for small bowel obstruction. Additionally there is wall thickening of the duodenum and proximal small bowel concerning for enteritis which may be reactive, infectious, inflammatory or ischemic in etiology. 2. Interval insertion right lower quadrant pigtail drainage catheter with drainage of one of the right lower quadrant fluid collections. The adjacent right pelvic sidewall complex fluid collection persists and may represent an abscess or potentially complex ovarian lesion. Consider further evaluation of this lesion with pelvic ultrasound. Additionally there is a loculated fluid collection within the pelvis. 3. Interval expansion of the left obturator muscle. This may be secondary to intramuscular hematoma or potentially intramuscular infection. 4. Large right and moderate left pleural effusions with underlying pulmonary consolidation which may represent atelectasis or  infection. 5. Cholelithiasis. 6. Anasarca. 7. Cirrhosis and portal venous hypertension. 8. These results will be called to the ordering clinician or representative by the Radiologist Assistant, and communication documented in the PACS or zVision Dashboard. Electronically Signed   By: Lovey Newcomer M.D.   On: 01/30/2018 18:32   Ct Abdomen Pelvis W Contrast  Result Date: 01/22/2018 CLINICAL DATA:  Abdominal pain, lower mainly of unclear etiology. Prior CT Abd without contrast w PSBO> none clinically or on f/u Xrays. Leukocytosis. CT with PO and IV contrast. EXAM: CT ABDOMEN AND PELVIS WITH CONTRAST TECHNIQUE: Multidetector CT imaging of the abdomen and pelvis was performed using the standard protocol following bolus administration of intravenous contrast. CONTRAST:  37mL ISOVUE-300 IOPAMIDOL (ISOVUE-300) INJECTION 61% COMPARISON:  CT of the abdomen and pelvis on 01/23/2018 and 07/20/2017 FINDINGS: Lower chest: Increased RIGHT pleural effusion. New LEFT pleural effusion. There is dependent consolidation bilaterally, RIGHT greater than LEFT. Cardiomegaly. Hepatobiliary: New ascites in the abdomen and pelvis. Cirrhotic contour of the liver. Stable appearance of small low-attenuation lesion within the RIGHT hepatic lobe measuring 9 millimeters. Gallbladder is distended. Calcified gallstones are present. Pancreas: Unremarkable. No pancreatic ductal dilatation or surrounding inflammatory changes. Spleen: Normal in size without focal abnormality. Adrenals/Urinary Tract: Adrenal glands are normal. No hydronephrosis. Bladder is decompressed by a Foley catheter. Stomach/Bowel: Stomach is decompressed. There is dilatation of small bowel loops, increased compared to prior study. There is smooth tapering of small bowel loops to the level of the RIGHT central pelvis where soft tissue in low-attenuation masses are identified, described below. The colonic loops are relatively decompressed. There are scattered colonic diverticula  but no acute diverticulitis. The appendix is not well seen. Changes in the RIGHT LOWER QUADRANT raise the question of appendicitis versus other process. A dense background recommend correlation with previous surgical history if available. Vascular/Lymphatic: There is dense atherosclerotic calcification of the abdominal aorta. Although involved by atherosclerosis, there is vascular opacification of the celiac axis, superior mesenteric artery, and inferior mesenteric artery. Normal appearance of the portal venous system and inferior vena cava. Numerous collateral vessels are identified in the LEFT UPPER QUADRANT consistent with portal venous hypertension. Reproductive: Hysterectomy. Other: New ascites.  Body wall edema. Within the RIGHT LOWER QUADRANT, adjacent to the pelvic sidewall, there are low-attenuation and higher attenuation collections. Low-attenuation collections measure 2.5 x 3.7 and 2.9 x 0.2 centimeters. Higher attenuation mass is 2.0 x 3.4 centimeters. There is associated thickening of the RIGHT  peritoneal reflection. Considerations include appendiceal abscess, tumor, or ovarian mass. This region also appears to be in close proximity to the point of small bowel obstruction. Musculoskeletal: Degenerative changes are seen in the mid lumbar spine. IMPRESSION: 1. RIGHT LOWER QUADRANT pelvic side wall soft tissues and masses. Considerations include complex appendiceal abscess, tumor or ovarian mass. When compared with the CT exam from October 2018, the findings favor an appendiceal abscess given the location of the appendix on the previous exam. 2. New ascites. 3. Increased RIGHT pleural effusion.  New LEFT pleural effusion. 4. Cirrhosis and portal venous hypertension. 5.  Aortic atherosclerosis.  (ICD10-I70.0) 6. Hysterectomy. These results were called by telephone at the time of interpretation on 01/22/2018 at 6:30 pm to Dr. Vernell Leep , who verbally acknowledged these results. Electronically Signed    By: Nolon Nations M.D.   On: 01/22/2018 18:43   Dg Chest Port 1 View  Result Date: 02/05/2018 CLINICAL DATA:  Pulmonary edema EXAM: PORTABLE CHEST 1 VIEW COMPARISON:  Chest radiograph from one day prior. FINDINGS: Enteric tube enters stomach with the tip not seen on this image. Intact sternotomy wires. Right PICC terminates over the right atrium. Stable cardiomediastinal silhouette with mild cardiomegaly. No pneumothorax. Small right pleural effusion is stable. Trace left pleural effusion is stable. Extensive patchy opacities throughout both lungs, most prominent in the left upper lung, not appreciably changed. IMPRESSION: 1. No appreciable change in extensive patchy bilateral lung opacities, which could represent severe pulmonary edema and/or multilobar pneumonia. 2. Stable small right and trace left pleural effusions with mild cardiomegaly. Electronically Signed   By: Ilona Sorrel M.D.   On: 02/05/2018 09:08   Dg Chest Port 1 View  Result Date: 02/04/2018 CLINICAL DATA:  PICC line in place. EXAM: PORTABLE CHEST 1 VIEW COMPARISON:  One-view chest x-ray from the same day at 5:13 a.m. FINDINGS: The heart is enlarged. NG tube courses off the inferior border of the film. A left IJ line terminates at the mid SVC. A right-sided PICC line terminates at the cavoatrial junction. Diffuse interstitial and airspace disease is again seen bilaterally. Right greater than left pleural effusions are noted. There is no significant interval change. IMPRESSION: 1. Diffuse interstitial and airspace disease bilaterally compatible with multi lobar pneumonia or ARDS. 2. Right greater than left pleural effusions and associated basilar airspace disease. Electronically Signed   By: San Morelle M.D.   On: 02/04/2018 18:13   Dg Chest Port 1 View  Result Date: 02/04/2018 CLINICAL DATA:  Acute respiratory failure with hypoxia. EXAM: PORTABLE CHEST 1 VIEW COMPARISON:  Radiograph of Feb 03, 2018. FINDINGS: Stable cardiomegaly.  Atherosclerosis of thoracic aorta is noted. Status post aortic valve replacement. Nasogastric tube is seen entering the stomach. Left internal jugular catheter tip is seen in SVC. Right-sided PICC line is unchanged in position. No pneumothorax is noted. Increased bilateral lung opacities are noted concerning for worsening pneumonia or edema, particularly in the left upper lobe. Bilateral pleural effusions are noted. Bony thorax is unremarkable. IMPRESSION: Mildly increased bilateral lung opacities are noted, consistent with worsening pneumonia or possibly edema, particularly in the left upper lobe. Bilateral pleural effusions are again noted. Stable support apparatus. Electronically Signed   By: Marijo Conception, M.D.   On: 02/04/2018 07:28   Dg Chest Port 1 View  Result Date: 02/03/2018 CLINICAL DATA:  84 year old female perforated appendicitis, small bowel obstruction related to right lower quadrant inflammatory mass. Postoperative day 2 exploratory laparotomy, small-bowel resection, right salpingectomy, drainage of  abdominal abscess. EXAM: PORTABLE CHEST 1 VIEW COMPARISON:  02/02/2018 and earlier. FINDINGS: Portable AP semi upright view at 0340 hours. Intubated. Endotracheal tube tip between the clavicles and carina. Enteric tube courses to the abdomen as before, tip not included. Stable right PICC line. Continued veiling opacity in both lower lungs. Superimposed patchy bilateral perihilar opacity. Stable lung volumes in ventilation since yesterday. No pneumothorax. Paucity bowel gas in the upper abdomen. IMPRESSION: 1.  Stable lines and tubes. 2. Stable ventilation since yesterday with bilateral pleural effusions, lower lobe collapse or consolidation, and additional nonspecific perihilar patchy opacity. Bilateral pneumonia is possible. Electronically Signed   By: Genevie Ann M.D.   On: 02/03/2018 08:05   Dg Chest Port 1 View  Result Date: 02/02/2018 CLINICAL DATA:  Respiratory failure EXAM: PORTABLE CHEST 1  VIEW COMPARISON:  01/07/2018 FINDINGS: Endotracheal tip approximately 7.5 cm above the carina. Left jugular central venous catheter tip SVC. Right arm PICC tip in the SVC. NG enters the stomach with the tip not visualized Diffuse bilateral airspace disease unchanged. Bilateral pleural effusions and bibasilar airspace disease right greater than left also unchanged. IMPRESSION: Endotracheal tube 7.5 cm above the carina Diffuse bilateral airspace disease and bilateral effusions unchanged from yesterday. Electronically Signed   By: Franchot Gallo M.D.   On: 02/02/2018 07:34   Dg Chest Port 1 View  Result Date: 01/23/2018 CLINICAL DATA:  Status post abdominal surgery, respiratory failure. EXAM: PORTABLE CHEST 1 VIEW COMPARISON:  Radiograph of same day. FINDINGS: Stable cardiomegaly. Endotracheal and nasogastric tubes are unchanged in position. No pneumothorax is noted. Right-sided PICC line is unchanged in position. Left internal jugular catheter is noted with distal tip in expected position of the SVC. Central pulmonary vascular congestion is noted. Bilateral perihilar opacities are noted concerning for pneumonia or edema. Stable bilateral pleural effusions are noted, right greater than left with associated atelectasis or edema. Bony thorax is unremarkable. IMPRESSION: Stable support apparatus. Stable bilateral lung opacities are noted concerning for edema with associated pleural effusions. Aortic Atherosclerosis (ICD10-I70.0). Electronically Signed   By: Marijo Conception, M.D.   On: 01/31/2018 21:57   Dg Chest Port 1 View  Result Date: 01/13/2018 CLINICAL DATA:  84 year old female post central line placement. Subsequent encounter. EXAM: PORTABLE CHEST 1 VIEW COMPARISON:  01/05/2018 8:15 a.m. FINDINGS: Endotracheal tube tip 6.2 cm above the carina. Left central line has been placed from left jugular position. Tip is at the level of the proximal superior vena cava directed laterally. This could impinge upon the  lateral wall of the superior vena cava with injection. Right PICC line tip mid superior vena cava level. Nasogastric tube courses below the diaphragm. Tip is not included on the present exam. Post median sternotomy and valve replacement.  Cardiomegaly. Asymmetric airspace disease with right-sided pleural effusion. Pulmonary edema may contribute to this appearance although infectious infiltrate is also consideration in the proper clinical setting. Right base atelectasis. No pneumothorax. Calcified tortuous aorta. IMPRESSION: Since the prior examination, endotracheal tube has been placed with tip 6.2 cm above the carina. Left central line has been placed from left jugular position. Tip is at the level of the proximal superior vena cava directed laterally. This could impinge upon the lateral wall of the superior vena cava with injection. Remainder of findings without significant changes detailed above. These results will be called to the ordering clinician or representative by the Radiologist Assistant, and communication documented in the PACS or zVision Dashboard. Electronically Signed   By: Genia Del  M.D.   On: 01/28/2018 16:19   Dg Chest Port 1 View  Result Date: 01/28/2018 CLINICAL DATA:  Shortness of breath, hypertension. Question aspiration. EXAM: PORTABLE CHEST 1 VIEW COMPARISON:  01/23/2018 FINDINGS: Right PICC line remains in place, unchanged. Cardiomegaly. Prior median sternotomy. Vascular congestion. Moderate layering right pleural effusion. Perihilar and lower lobe airspace opacities have increased since prior study with increasing interstitial prominence, favor edema. No visible left effusion. No acute bony abnormality. IMPRESSION: Moderate layering right pleural effusion. Cardiomegaly. Increasing perihilar and lower lobe opacities, right greater than left and interstitial prominence, favor edema/CHF. Electronically Signed   By: Rolm Baptise M.D.   On: 01/28/2018 11:06   Dg Chest Port 1  View  Result Date: 01/23/2018 CLINICAL DATA:  Status post PICC placement today. EXAM: PORTABLE CHEST 1 VIEW COMPARISON:  PA and lateral chest 01/20/2018. FINDINGS: New right PICC is in place with the tip projecting in the lower superior vena cava. Right pleural effusion and basilar airspace disease have worsened. Left lung is clear. No pneumothorax. There is cardiomegaly. IMPRESSION: Tip of right PICC projects in the lower superior vena cava. Small to moderate right pleural effusion and basilar airspace disease have increased since the most recent exam. Electronically Signed   By: Inge Rise M.D.   On: 01/23/2018 14:30   Dg Abd Acute W/chest  Result Date: 01/29/2018 CLINICAL DATA:  Abdominal pain and distention today. Nausea and vomiting. EXAM: DG ABDOMEN ACUTE W/ 1V CHEST COMPARISON:  Chest 01/28/2018.  CT abdomen and pelvis 01/22/2018 FINDINGS: Postoperative changes in the mediastinum. Right PICC line with tip over the cavoatrial junction region. No pneumothorax. Cardiac enlargement with mild vascular congestion. Mild perihilar edema. Bilateral pleural effusions with basilar atelectasis, greater on the right. Similar appearance to previous study. Calcification of the aorta. Multiple dilated gas-filled small bowel loops with mostly decompressed colon. Changes suggest small bowel obstruction. Residual contrast material in the colon. No free intra-abdominal air. Right lower quadrant drainage catheter. Degenerative changes in the spine. No radiopaque stones. IMPRESSION: Congestive changes in the heart and lungs with cardiac enlargement, pulmonary vascular congestion, mild perihilar edema, and bilateral pleural effusions and basilar atelectasis, greater on the right. Dilated gas-filled small bowel with nondilated colon suggesting small bowel obstruction. Electronically Signed   By: Lucienne Capers M.D.   On: 01/29/2018 00:33   Dg Abd Portable 1v  Result Date: 01/10/2018 CLINICAL DATA:  Bowel  obstruction EXAM: PORTABLE ABDOMEN - 1 VIEW COMPARISON:  January 31, 2018 abdominal radiograph and abdominal CT January 30, 2018 FINDINGS: Nasogastric tube tip and side port are in the stomach. There remain loops of dilated small and large bowel. There is contrast in the ascending colon. There is a drain in the right pelvic region. There is a loculated pleural effusion in the lateral right base. IMPRESSION: Nasogastric tube tip and side port in stomach. The bowel gas pattern is more suggestive of ileus than bowel obstruction, although a degree of bowel obstruction is certainly possible. No free air. Loculated pleural effusion right base. Electronically Signed   By: Lowella Grip III M.D.   On: 01/15/2018 09:58   Dg Abd Portable 1v  Result Date: 01/31/2018 CLINICAL DATA:  Status post NG tube placement. EXAM: PORTABLE ABDOMEN - 1 VIEW COMPARISON:  01/31/2018 FINDINGS: The nasogastric tube tip and side port are well below the level of the GE junction. The tip is in the projection of the distal body of stomach gaseous distension of the large and small bowel loops are  again noted compatible with ileus versus partial obstruction. The degree of bowel distention is not significantly changed from comparison exam IMPRESSION: 1. NG tube tip projects over the expected location of the distal body of stomach. 2. No change in small bowel obstruction pattern. Electronically Signed   By: Kerby Moors M.D.   On: 01/31/2018 16:56   Dg Abd Portable 1v  Result Date: 01/31/2018 CLINICAL DATA:  84 year old female with evidence of small bowel obstruction in the pelvis on CT yesterday. Fluid collections, percutaneous drainage catheter placed. EXAM: PORTABLE ABDOMEN - 1 VIEW COMPARISON:  CT Abdomen and Pelvis 01/30/2018 and earlier. FINDINGS: Portable AP supine views at 0953 hours. Right lower quadrant pigtail drain remains in place. There is gas throughout small and large bowel loops. Small bowel loops in the mid to lower abdomen  remain dilated up to 48 millimeters diameter. Oral contrast yesterday now appears to be primarily within the proximal colon. Superimposed evidence of ascites, and the right pleural effusion demonstrated yesterday. No definite pneumoperitoneum on these supine views. No acute osseous abnormality identified. IMPRESSION: 1. Stable bowel gas pattern since yesterday compatible with a high-grade partial small bowel obstruction, but there may be superimposed small and large bowel ileus as well. 2. Evidence of ascites and pleural effusion redemonstrated. 3. Stable right lower quadrant percutaneous drain. Electronically Signed   By: Genevie Ann M.D.   On: 01/31/2018 10:22   Dg Abd Portable 1v  Result Date: 01/21/2018 CLINICAL DATA:  Small-bowel obstruction EXAM: PORTABLE ABDOMEN - 1 VIEW COMPARISON:  01/20/2018 FINDINGS: Normal bowel gas pattern.  No dilated bowel loops. Right pleural effusion and right lower lobe airspace disease IMPRESSION: Normal bowel gas pattern. Electronically Signed   By: Franchot Gallo M.D.   On: 01/21/2018 10:45   Dg Abd Portable 1v  Result Date: 01/20/2018 CLINICAL DATA:  Abdominal pain since 01/23/2018. EXAM: PORTABLE ABDOMEN - 1 VIEW COMPARISON:  CT abdomen and pelvis today. FINDINGS: Mild dilatation of small bowel seen on CT today is difficult to appreciate on this examination. Gas is seen throughout almost the entire colon. No unexpected abdominal calcification or focal bony abnormality. IMPRESSION: Mild small bowel dilatation seen on CT scan scanned today is difficult to appreciate on plain film. The bowel gas pattern is benign. Electronically Signed   By: Inge Rise M.D.   On: 01/20/2018 11:21   Ct Image Guided Drainage By Percutaneous Catheter  Result Date: 01/26/2018 INDICATION: Right lower quadrant abscess EXAM: CT GUIDED DRAINAGE OF RIGHT LOWER QUADRANT ABSCESS MEDICATIONS: The patient is currently admitted to the hospital and receiving intravenous antibiotics. The  antibiotics were administered within an appropriate time frame prior to the initiation of the procedure. ANESTHESIA/SEDATION: One mg IV Versed 50 mcg IV Fentanyl Moderate Sedation Time:  11 minutes The patient was continuously monitored during the procedure by the interventional radiology nurse under my direct supervision. COMPLICATIONS: None immediate. TECHNIQUE: Informed written consent was obtained from the patient after a thorough discussion of the procedural risks, benefits and alternatives. All questions were addressed. Maximal Sterile Barrier Technique was utilized including caps, mask, sterile gowns, sterile gloves, sterile drape, hand hygiene and skin antiseptic. A timeout was performed prior to the initiation of the procedure. PROCEDURE: The right lower quadrant was prepped with ChloraPrep in a sterile fashion, and a sterile drape was applied covering the operative field. A sterile gown and sterile gloves were used for the procedure. Local anesthesia was provided with 1% Lidocaine. Under CT guidance, an 18 gauge needle was inserted  into the right lower quadrant abscess. After aspirating pus, the needle was removed over an Amplatz wire. Ten [place] dilator followed by a 10 [place] drain were inserted. It was looped and string fixed in the fluid collection. It was then sewn to the skin. FINDINGS: Images document placement of a 10 French drain into a right lower quadrant abscess cavity. IMPRESSION: Successful 10 French right lower quadrant abscess drainage. Electronically Signed   By: Marybelle Killings M.D.   On: 01/26/2018 16:57   [place] Ekg Site Rite  Result Date: 02/03/2018 If Site Rite image not attached, placement could not be confirmed due to current cardiac rhythm.  [place] Ekg Site Rite  Result Date: 01/23/2018 If Site Rite image not attached, placement could not be confirmed due to current cardiac rhythm.   Microbiology: No results found for this or any previous visit (from the past 240 hour(s)).    Labs: Basic Metabolic Panel: Recent Labs  Lab 02/03/18 0315 02/04/18 0324 02/04/18 1827 02/05/18 0445 02/13/2018 0329  NA 143 144 149* 146* 146*  K 4.5 3.5 4.0 4.7 5.2*  CL 111 111 114* 112* 112*  CO2 27 26 27 27 26   GLUCOSE 152* 173* 155* 141* 135*  BUN 72* 86* 92* 105* 136*  CREATININE 1.49* 1.64* 1.77* 2.07* 2.84*  CALCIUM 7.8* 7.6* 7.7* 7.5* 7.3*  MG 1.9  --   --  2.4 2.5*  PHOS 4.4  --   --  6.5* 6.7*   Liver Function Tests: Recent Labs  Lab 02/13/18 0329  AST 44*  ALT 16  ALKPHOS 59  BILITOT 1.9*  PROT 4.7*  ALBUMIN 1.6*   Recent Labs  Lab 2018/02/13 0329  LIPASE 56*   No results for input(s): AMMONIA in the last 168 hours. CBC: Recent Labs  Lab 02/03/18 0315 02/03/18 1617 02/03/18 2220 02/04/18 0324 02/05/18 0445 2018/02/13 0329  WBC 39.7* 37.5*  --  33.5* 41.1* 46.0*  HGB 8.3* 7.3* 8.9* 8.5* 7.5* 7.7*  HCT 24.8* 22.5* 27.0* 25.5* 24.0* 24.8*  MCV 96.5 97.8  --  95.5 100.8* 99.2  PLT 143* 129*  --  104* 98* 98*   Cardiac Enzymes: No results for input(s): CKTOTAL, CKMB, CKMBINDEX, TROPONINI in the last 168 hours. D-Dimer No results for input(s): DDIMER in the last 72 hours. BNP: Invalid input(s): POCBNP CBG: Recent Labs  Lab 02/05/18 1921 02/05/18 2357 Feb 13, 2018 0346 Feb 13, 2018 0752 02-13-2018 1139  GLUCAP 205* 196* 134* 121* 125*   Anemia work up No results for input(s): VITAMINB12, FOLATE, FERRITIN, TIBC, IRON, RETICCTPCT in the last 72 hours. Urinalysis    Component Value Date/Time   COLORURINE YELLOW 01/28/2018 0938   APPEARANCEUR CLEAR 01/28/2018 0938   APPEARANCEUR Clear 08/17/2017 1457   LABSPEC 1.013 01/28/2018 0938   PHURINE 5.0 01/28/2018 0938   GLUCOSEU NEGATIVE 01/28/2018 0938   HGBUR SMALL (A) 01/28/2018 0938   BILIRUBINUR NEGATIVE 01/28/2018 0938   BILIRUBINUR Negative 08/17/2017 [place] 01/28/2018 0938   PROTEINUR NEGATIVE 01/28/2018 0938   NITRITE NEGATIVE 01/28/2018 0938   LEUKOCYTESUR NEGATIVE  01/28/2018 0938   LEUKOCYTESUR Negative 08/17/2017 1457   Sepsis Labs Invalid input(s): PROCALCITONIN,  WBC,  LACTICIDVEN     SIGNED:  Tawni Millers, MD  [facility] 02/09/2018, 4:30 PM Pager   If 7PM-7AM, please contact night-coverage [email or web address] Password [email or web address]

## 2018-03-05 NOTE — Progress Notes (Signed)
Pt sister stated she would be here at 9:00 and for [place] not to start the morphine gtt until she gets here. Will inform next shift of request.  Irven [place], RN

## 2018-03-05 NOTE — Progress Notes (Signed)
Progress Note   Subjective   Pt appears to be approaching end of life.  Hypotensive with multiorgan failure.  Inpatient Medications    Scheduled Meds: . chlorhexidine  15 mL Mouth Rinse BID  . Chlorhexidine Gluconate Cloth  6 each Topical Daily  . insulin aspart  0-15 Units Subcutaneous Q4H  . mouth rinse  15 mL Mouth Rinse q12n4p  . metoprolol tartrate  2.5 mg Intravenous Q8H  . sodium chloride flush  10-40 mL Intracatheter Q12H   Continuous Infusions: . sodium chloride 10 mL/hr at 02-20-2018 0700  . amiodarone 30 mg/hr (Feb 20, 2018 1101)  . piperacillin-tazobactam (ZOSYN)  IV 3.375 g (02/20/2018 0913)  . vancomycin Stopped (02/05/18 1300)   PRN Meds: hydrALAZINE, morphine injection, ondansetron (ZOFRAN) IV, sodium chloride flush   Vital Signs    Vitals:   02-20-2018 0800 02/20/18 0803 02-20-18 0900 20-Feb-2018 1000  BP: (!) 83/46 (!) 83/46 (!) 82/45 (!) 70/37  Pulse: 84 84 76 80  Resp: 16 13 13 14   Temp:      TempSrc:      SpO2: 95% 96% 97% 98%  Weight:      Height:        Intake/Output Summary (Last 24 hours) at 02/20/18 1147 Last data filed at 2018-02-20 0810 Gross per 24 hour  Intake 2571.93 ml  Output 1350 ml  Net 1221.93 ml   Filed Weights   02/04/18 0359 02/05/18 0500 02/20/18 0500  Weight: 170 lb 10.2 oz (77.4 kg) 171 lb 1.2 oz (77.6 kg) 176 lb 12.9 oz (80.2 kg)    Telemetry    Afib, rate controlled - Personally Reviewed  Physical Exam   GEN- The patient is ill, unresponsive, in extremis.   Head- normocephalic, atraumatic Eyes-  Sclera clear, conjunctiva pink Ears- hearing intact Oropharynx- clear Neck- supple, Lungs- tachypneic, agonal breathing Heart- irregular rate and rhythm  GI- soft, NT, ND, + BS Extremities- no clubbing, cyanosis, +edema  MS- no significant deformity or atrophy Skin- no rash or lesion Psych- not responsive Neuro- not responsive   Labs    Chemistry Recent Labs  Lab 01/31/18 0417  01/14/2018 1710 02/02/18 0453   02/04/18 1827 02/05/18 0445 02/20/2018 0329  NA 135   < > 144 142   < > 149* 146* 146*  K 5.0   < > 3.4* 3.6   < > 4.0 4.7 5.2*  CL 101   < > 107 105   < > 114* 112* 112*  CO2 30   < > 28 30   < > 27 27 26   GLUCOSE 191*   < > 179* 272*   < > 155* 141* 135*  BUN 43*   < > 45* 50*   < > 92* 105* 136*  CREATININE 1.07*   < > 0.91 0.98   < > 1.77* 2.07* 2.84*  CALCIUM 8.1*   < > 8.3* 8.2*   < > 7.7* 7.5* 7.3*  PROT 6.5  --  4.5* 4.4*  --   --   --   --   ALBUMIN 1.8*  --  1.8* 1.7*  --   --   --   --   AST 42*  --  30 29  --   --   --   --   ALT 12*  --  12* 12*  --   --   --   --   ALKPHOS 69  --  43 45  --   --   --   --  BILITOT 1.8*  --  3.2* 2.5*  --   --   --   --   GFRNONAA 47*   < > 57* 52*   < > 25* 21* 14*  GFRAA 54*   < > >60 >60   < > 29* 24* 17*  ANIONGAP 4*   < > 9 7   < > 8 7 8    < > = values in this interval not displayed.     Hematology Recent Labs  Lab 02/04/18 0324 02/05/18 0445 02-11-18 0329  WBC 33.5* 41.1* 46.0*  RBC 2.67* 2.38* 2.50*  HGB 8.5* 7.5* 7.7*  HCT 25.5* 24.0* 24.8*  MCV 95.5 100.8* 99.2  MCH 31.8 31.5 30.8  MCHC 33.3 31.3 31.0  RDW 19.3* 20.9* 22.0*  PLT 104* 98* 98*    Cardiac EnzymesNo results for input(s): TROPONINI in the last 168 hours. No results for input(s): TROPIPOC in the last 168 hours.       Patient Profile   84 y.o.femalewith a history ofrheumatic valvular heart disease status post redo aortic valve replacement x3 and redo mitral valve surgery x2, persistent atrial fibrillation, coumadin for anticoagulation, chronic LE edema (multifactorial with diastolic CHF, venous insufficiency, cirrhosis)now admitted withperforated appendix withSBO. We are assisting with management of diastolic heart failure and atrial fibrillation.   Assessment & Plan    The patient has decompensated and has extensive multiorgan failure.  She is not responsive and has agonal breathing.  She is almost certainly approaching end of life.  She is  DNR. Palliative options would seem most appropriate at this time.  Thompson Grayer MD, [facility] 02-11-2018 11:47 AM

## 2018-03-05 NOTE — Progress Notes (Signed)
This RN spoke with Son in law "Jenny Reichmann" who stated to leave bipap on until Niece/Sister can come by to say their goodbyes between 6-7.  Irven Baltimore, RN

## 2018-03-05 NOTE — Progress Notes (Signed)
PHARMACY - ADULT TOTAL PARENTERAL NUTRITION CONSULT NOTE   Pharmacy Consult:  TPN Indication:  Prolonged ileus  Patient Measurements: Height: 5\' 6"  (167.6 cm) Weight: 176 lb 12.9 oz (80.2 kg) IBW/kg (Calculated) : 59.3 TPN AdjBW (KG): 63 Body mass index is 28.54 kg/m.  Assessment:  89 YOF presented on 01/08/2018 with N/V/D and abdominal pain, found to have pSBO. She was placed on a clear liquid diet on 01/21/18.  01/22/18 CT concerning for perforated appendicitis and patient may need surgery; therefore, she was made NPO again. Pharmacy consulted to manage TPN for ileus and prolonged NPO status.  Patient transferred to the ICU on 01/04/2018 s/p s/p ex-lap with LoA, SBR, appendectomy, drainage of abd abscess. Continue TPN with lipids given that patient has been on TPN since 01/23/18 and not a new start.  GI: cirrhosis (plan rifaxamin when can take po).  Prealbumin remains <5, LBM 4/27, abscess drain O/P 870>990>1051ml, NG O/P 23ml down.  Pepcid IV, PRN Zofran. Vac change 5/4. Clinical status worsening (made DNR). No further escalation of therapy. Discuss palliative care.  Endo: no hx DM - CBGs 121-205 Insulin requirements in the past 24 hours: 19 units SSI + 10 units in TPN  Lytes: Na 146, Cl 112,  K 3.5>4.7>5.2 up today,  Mag 2.5 (goal >/= 2), Phos 6.7 up, Ca 7.3 adjusts to 9.14  Renal: SCr 0.98 >2.84 up again, BUN up to 136 - UOP 0.1 ml/kg/hr, net 5L since admit, anasarca (Albumin 25g IV x 1 on 5/3). Acute renal failure. - 5/3 note: aggressive IV diuresis with lasix today, needs to be net negative, give IV Lasix  Pulm: Extubated. Bipap 5/4.  Cards: Rheumatic heart dz with mechanical valves, Afib, HF:  IV lasix again 5/4, IV metoprolol, IV amiodarone started 5/1, off digoxin  AC: Coumadin PTA for Afib / mech valves >> Heparin, s/p Vit K - hgb 8.5>7.7 today, plts stable at 98 - 5/2: Bleeding from femoral arterial line site. Transfuse 1 unit. - 5/5: Heparin d/c'd  Hepatobil: LFTs / TG WNL,  Tbili 3.2>2.5  Neuro: Agitated. Not responsive  WN:IOEVO/ Zosyn for appendiceal abscess/enteritis, cx grew Actinomyces and Bacteroides -CXR shows bilateral effusions and ?of PNA vs. Pulmonary edema. Afebrile, WBC up to 46 today  TPN Access: PICC placed 01/23/18, exchanged 5/3 TPN start date: 01/23/18  Nutritional Goals (per RD 5/3): 1500-1700 kCal,  77-96 g protein per day    Current Nutrition:  TPN   Plan:  IV heparin d/c'd TPN d/c'd     [name] Chung S. Alford Highland, PharmD, [facility] Clinical Staff Pharmacist Pager [phone]  02/23/18, 9:57 AM

## 2018-03-05 NOTE — Progress Notes (Signed)
Patient's bipap removed- placed on venti mask at 6 liters per patient family request. Morphine gtt increased prior to removal and boluses given.   Carefully supporting family to make sure patient remains comfortable.  Milford Cage, RN

## 2018-03-05 NOTE — Progress Notes (Signed)
Patient deceased. Verified by 2 RN's. Family at bedside. Support given to family.   Milford Cage, RN

## 2018-03-05 NOTE — Progress Notes (Signed)
PROGRESS NOTE    [name] Chung  EUM:[id number] DOB: [birth year] DOA: 01/21/2018 PCP: Lajean Manes, MD    Brief Narrative:  84 year old female who presented with nausea, vomiting, abdominal pain and diarrhea.  Patient does have the significant past medical history of atrial fibrillation, rheumatic heart disease status post aortic valve replacement x3 and mitral valve replacement x2, (Saint Jude mechanical mitral valve and Medtronic freestyle aortic root conduit ), liver cirrhosis, congestive heart failure, and chronic kidney disease stage III.  Patient reported 3 days of abdominal pain, associated with distention and complicated by diarrhea.  She was treated with antibiotic therapy as an outpatient for a urinary tract infection.  On her initial physical examination blood pressure 116/52, heart rate 121, respiratory rate 20, oxygen saturation 92%.  Patient was awake and alert, heart S1-S2 present, rhythmic, lungs were clear to auscultation bilaterally, her abdomen was distended, diffusely tender but no rebound or guarding, positive lower extremity edema 2+ bilaterally.  CT of the abdomen with mild small bowel dilatation with air-fluid levels, suggestive of partial small bowel obstruction.   Patient was admitted to the hospital with the working diagnosis of partial small bowel obstruction, complicated by atrial fibrillation with rapid ventricular response.  Follow-up CT showed appendiceal abscess, she underwent drain placement by interventional radiology.  Unfortunately she continued to deteriorate despite percutaneous drainage, and underwent exploratory laparotomy, lysis of adhesions, small bowel resection, appendectomy, right salpingectomy and drainage of abdominal abscess on April 30.   Perioperatively patient developed shock, requiring vasopressors and invasive mechanical ventilation.   Extubated Feb 03, 2018, transferred to [facility] Feb 05, 2018.   Assessment & Plan:   Principal  Problem:   Partial small bowel obstruction (HCC) Active Problems:   Chronic atrial fibrillation (HCC)   Long term current use of anticoagulant therapy   S/P MVR (mitral valve replacement)   S/P AVR (aortic valve replacement)   Acute renal failure superimposed on stage 3 chronic kidney disease (HCC)   Atrial fibrillation with RVR (HCC)   Chronic systolic (congestive) heart failure (HCC)   Hyponatremia   Sepsis (HCC)   Liver cirrhosis (HCC)   Leukocytosis   Septic shock ([place])   Perforated appendicitis   Palliative care by specialist   Goals of care, counseling/discussion   Advanced care planning/counseling discussion   Ileus ([place])   Acute on chronic diastolic heart failure ([place])   Acute respiratory failure with hypoxia ([place])   1. New multiorgan failure. Multiorgan failure, respiratory, circulatory, renal ana neurologic. Patient with very poor prognosis. Patient has expressed in the past not willing heroic measures in the setting of poor prognosis. Will discuss with patient's family, further palliative care, including continuous infusion of morphine and removing non invasive mechanica ventilation.   2.  Appendicitis, complicated by appendiceal abscess, status post exploratory laparotomy. Persistent anemia, despite PRBC transfusion, worsening leukocytosis, drain output over last 24 hours 1,075 cc. Patient on IV Zosyn and IV vancomycin.   3.  Decompensated diastolic heart failure (acute on chronic), with acute cardiogenic pulmonary edema/ multifocal pneumonia with acute hypoxic respiratory failure. Placed on non invasive mechanical ventilation, respiratory rate just above the backup rate, patient breathing at 13 to 16 bmp, unresponsive. Fi-02 60%,   4.  Acute kidney injury on chronic kidney disease. Continue worsening renal function with serum cr at 2,84 with K at 5,2 and serum bicarbonate at 26. Patient received 20 mg of furosemide after PRBC transfusion, Urine output has decreased to 265  over last 24 hours.  5. Atrial fibrillation. Remained rate controlled with miodarone infusion, positive anemia, will hold on heparin drip.   6.  Acute blood loss anemia. Persistent anemia, despite prbc transfusion. Patient with multiorgan failure, and poor prognosis, will hold on further prbc transfusion for now.   7.  Metabolic encephalopathy. Patient has been unresponsive to voice or pain stimuli, not following commands, glasgow coma scale down to 3.   8.  Type 2 diabetes mellitus. Capillary glucose 171, 205,l 196, 134, 121. On TPN.    Patient critically ill, multiorgan failure, death is imminent, will continue current medical care, will update patient's family.   Critical care 60 minutes.   DVT prophylaxis: scd  Code Status: DNR Family Communication: no family at the bedside Disposition Plan: critically ill    Consultants:   Cardiology   Surgery  Palliative Care  Procedures:     Antimicrobials:   Vancomycin   Zosyn     Subjective: Patient has been unresponsive, all information from nursing. Has slow respirations and developed hypotension.   Objective: Vitals:   Feb 17, 2018 0400 2018-02-17 0410 02/17/2018 0500 02-17-2018 0600  BP: (!) 87/39 (!) 87/39 (!) 81/36 (!) 86/44  Pulse: 84 86 80 72  Resp: 15 14 13 20   Temp:      TempSrc:      SpO2: 95% 96% 95% 94%  Weight:   80.2 kg (176 lb 12.9 oz)   Height:        Intake/Output Summary (Last 24 hours) at Feb 17, 2018 0748 Last data filed at 2018-02-17 0600 Gross per 24 hour  Intake 2799.85 ml  Output 1400 ml  Net 1399.85 ml   Filed Weights   02/04/18 0359 02/05/18 0500 17-Feb-2018 0500  Weight: 77.4 kg (170 lb 10.2 oz) 77.6 kg (171 lb 1.2 oz) 80.2 kg (176 lb 12.9 oz)    Examination:   General: agonal breathing Neurology: unresponsive  E ENT: positive pallor, no icterus, oral mucosa dry. NG tube in place.  Cardiovascular: No JVD. S1-S2 present, rhythmic, no gallops, rubs, or murmurs. Positive lower extremity  edema. Pulmonary:decreased breath sounds bilaterally, poor air movement, no wheezing, rhonchi or rales. Gastrointestinal. Abdomen distended and tense, no organomegaly, non tender, no rebound or guarding Skin. No rashes Musculoskeletal: no joint deformities     Data Reviewed: I have personally reviewed following labs and imaging studies  CBC: Recent Labs  Lab 01/31/18 0417  02/02/18 0453 02/03/18 0315 02/03/18 1617 02/03/18 2220 02/04/18 0324 02/05/18 0445 2018/02/17 0329  WBC 36.1*   < > 37.2* 39.7* 37.5*  --  33.5* 41.1* 46.0*  NEUTROABS 28.8*  --  33.8*  --   --   --   --   --   --   HGB 7.9*   < > 8.8* 8.3* 7.3* 8.9* 8.5* 7.5* 7.7*  HCT 24.9*   < > 26.8* 24.8* 22.5* 27.0* 25.5* 24.0* 24.8*  MCV 102.9*   < > 95.0 96.5 97.8  --  95.5 100.8* 99.2  PLT 173   < > 178 143* 129*  --  104* 98* 98*   < > = values in this interval not displayed.   Basic Metabolic Panel: Recent Labs  Lab 01/31/18 0417  02/03/18 0315 02/04/18 0324 02/04/18 1827 02/05/18 0445 02-17-2018 0329  NA 135   < > 143 144 149* 146* 146*  K 5.0   < > 4.5 3.5 4.0 4.7 5.2*  CL 101   < > 111 111 114* 112* 112*  CO2 30   < >  27 26 27 27 26   GLUCOSE 191*   < > 152* 173* 155* 141* 135*  BUN 43*   < > 72* 86* 92* 105* 136*  CREATININE 1.07*   < > 1.49* 1.64* 1.77* 2.07* 2.84*  CALCIUM 8.1*   < > 7.8* 7.6* 7.7* 7.5* 7.3*  MG 2.2  --  1.9  --   --  2.4 2.5*  PHOS 4.0  --  4.4  --   --  6.5* 6.7*   < > = values in this interval not displayed.   GFR: Estimated Creatinine Clearance: 16 mL/min (A) (by C-G formula based on SCr of 2.84 mg/dL (H)). Liver Function Tests: Recent Labs  Lab 01/31/18 0417 01/26/2018 1710 02/02/18 0453  AST 42* 30 29  ALT 12* 12* 12*  ALKPHOS 69 43 45  BILITOT 1.8* 3.2* 2.5*  PROT 6.5 4.5* 4.4*  ALBUMIN 1.8* 1.8* 1.7*   No results for input(s): LIPASE, AMYLASE in the last 168 hours. No results for input(s): AMMONIA in the last 168 hours. Coagulation Profile: Recent Labs  Lab  02/02/18 0453 02/03/18 0315 02/04/18 0324 02/05/18 0445 02-19-18 0329  INR 1.66 1.83 1.81 1.79 1.99   Cardiac Enzymes: No results for input(s): CKTOTAL, CKMB, CKMBINDEX, TROPONINI in the last 168 hours. BNP (last 3 results) No results for input(s): PROBNP in the last 8760 hours. HbA1C: No results for input(s): HGBA1C in the last 72 hours. CBG: Recent Labs  Lab 02/05/18 1144 02/05/18 1622 02/05/18 1921 02/05/18 2357 Feb 19, 2018 0346  GLUCAP 127* 171* 205* 196* 134*   Lipid Profile: No results for input(s): CHOL, HDL, LDLCALC, TRIG, CHOLHDL, LDLDIRECT in the last 72 hours. Thyroid Function Tests: No results for input(s): TSH, T4TOTAL, FREET4, T3FREE, THYROIDAB in the last 72 hours. Anemia Panel: No results for input(s): VITAMINB12, FOLATE, FERRITIN, TIBC, IRON, RETICCTPCT in the last 72 hours.    Radiology Studies: I have reviewed all of the imaging during this hospital visit personally     Scheduled Meds: . chlorhexidine  15 mL Mouth Rinse BID  . Chlorhexidine Gluconate Cloth  6 each Topical Daily  . insulin aspart  0-15 Units Subcutaneous Q4H  . mouth rinse  15 mL Mouth Rinse q12n4p  . metoprolol tartrate  2.5 mg Intravenous Q8H  . sodium chloride flush  10-40 mL Intracatheter Q12H   Continuous Infusions: . sodium chloride 10 mL/hr at 02-19-18 0600  . amiodarone 30 mg/hr (02/19/2018 0600)  . heparin 1,800 Units/hr (19-Feb-2018 0739)  . piperacillin-tazobactam (ZOSYN)  IV Stopped (2018-02-19 0432)  . TPN ADULT (ION) 45 mL/hr at 02/19/18 0600  . vancomycin Stopped (02/05/18 1300)     LOS: 18 days        Awanda Wilcock Gerome Apley, MD [facility] Pager [phone]

## 2018-03-05 NOTE — Progress Notes (Signed)
ANTICOAGULATION CONSULT NOTE - Follow Up Consult  Pharmacy Consult for Heparin Indication: atrial fibrillation and mechanical AVR & MVR  Patient Measurements: Height: 5\' 6"  (167.6 cm) Weight: 176 lb 12.9 oz (80.2 kg) IBW/kg (Calculated) : 59.3 Heparin Dosing Weight: 58 kg  Vital Signs: Temp: 96.7 F (35.9 C) (05/05 0330) Temp Source: Axillary (05/05 0330) BP: 86/44 (05/05 0600) Pulse Rate: 72 (05/05 0600)  Labs: Recent Labs    02/04/18 0324 02/04/18 1047 02/04/18 1827 02/05/18 0445 2018-02-28 0329  HGB 8.5*  --   --  7.5* 7.7*  HCT 25.5*  --   --  24.0* 24.8*  PLT 104*  --   --  98* 98*  LABPROT 20.9*  --   --  20.6* 22.4*  INR 1.81  --   --  1.79 1.99  HEPARINUNFRC  --  0.32  --  0.30 0.62  CREATININE 1.64*  --  1.77* 2.07* 2.84*    Estimated Creatinine Clearance: 16 mL/min (A) (by C-G formula based on SCr of 2.84 mg/dL (H)).  Assessment: Warfarin PTA for afib and mechanical valve. Warfarin currently on hold. H/H this AM is low and stable. Plt down to 104k. Heparin level is now slightly above reduced goal range. INR 1.99. Patient had some bleeding around arterial line site after removal which has now resolved.   Goal of Therapy:  Heparin level 0.3-0.5 units/ml Monitor platelets by anticoagulation protocol: Yes   Plan:  Decrease IV Heparin to 1800 units/hr. F/u 8 hr confirmatory heparin level Daily heparin level and CBC  Uvaldo Rising, BCPS  Clinical Pharmacist Pager [phone]  2018-02-28 7:29 AM

## 2018-03-05 NOTE — Progress Notes (Signed)
Will change patient to full comfort care after patient's family arrives, ordered a continuous infusion of morphine to start at 7:00 pm, then noninvasive mechanical ventilation should be discontinued.

## 2018-03-05 NOTE — Progress Notes (Signed)
Pt remains hypotensive this morning.  This RN informed Dr. Cathlean Sauer.  No new orders.  Irven Baltimore, RN

## 2018-03-05 NOTE — Progress Notes (Addendum)
Patient's family member arrived at bedside. Sister would like [place] to start the morphine drip at this time. Patient's morphine drip initiated. Waiting to withdraw the bipap until the morphine has had a chance to take effect.   Milford Cage, RN

## 2018-03-05 NOTE — Progress Notes (Signed)
[facility]  [address] [place], [place] [address] Phone: [phone]  FAX: [phone]      ENGLAND GREB [id number] Nov 11, 1932  CARE TEAM:  PCP: Lajean Manes, MD  Outpatient Care Team: Patient Care Team: Lajean Manes, MD as PCP - General (Internal Medicine) Martinique, Peter M, MD as PCP - Cardiology (Cardiology)  Inpatient Treatment Team: Treatment Team: Attending Provider: Tawni Millers, MD; Rounding Team: Edison Pace, Md, MD; Consulting Physician: Lbcardiology, Michae Kava, MD; Student Nurse: Hassan Buckler, Student-RN; Technician: Claudell Kyle, NT; Consulting Physician: Annita Brod, MD; Rounding Team: Sherald Barge, MD; Registered Nurse: Iline Oven, RN; Registered Nurse: Irven Baltimore, RN; Social Worker: Anell Barr   Problem List:   Principal Problem:   Tubo-ovarian abscess cauding SBO s/p ex lap/RSO/appendectomy 01/22/2018 Active Problems:   Liver cirrhosis ([place])   Chronic atrial fibrillation ([place])   Long term current use of anticoagulant therapy   S/P MVR (mitral valve replacement)   S/P AVR (aortic valve replacement)   SBO (small bowel obstruction) from TOA s/p exlap/LOA/appy/RSO 01/21/2018   Acute renal failure superimposed on stage 3 chronic kidney disease ([place])   Atrial fibrillation with RVR ([place])   Chronic systolic (congestive) heart failure ([place])   Hyponatremia   Sepsis ([place])   Leukocytosis   Septic shock ([place])   Periappendicitis from [place] s/p ex lap 01/10/2018   Palliative care by specialist   Goals of care, counseling/discussion   Advanced care planning/counseling discussion   Ileus ([place])   Acute on chronic diastolic heart failure ([place])   Acute respiratory failure with hypoxia ([place])   5 Days Post-Op  01/22/2018  Procedure(s): EXPLORATORY LAPOROTOMY APPENDECTOMY SMALL BOWEL RESECTION RIGHT SALPINGECTOMY  Diagnosis 1. Appendix, Other than Incidental - APPENDIX WITH  APPENDICITIS AND PROMINENT PERIAPPENDICITIS AND SEROSAL ADHESIONS, SEE NOTE. - NEGATIVE FOR PERFORATION. - NEGATIVE FOR MALIGNANCY. 2. Small intestine, resection - SEGMENT OF SMALL INTESTINE (39 CM) WITH SEROSITIS AND PROMINENT SEROSAL ADHESIONS WITH ASSOCIATED LUMINAL STENOSIS. - BENIGN REACTIVE LYMPH NODES. - NEGATIVE FOR MALIGNANCY 3. Fallopian tube, right - SEVERE PURULENT INFLAMMATION OF FALLOPIAN TUBE WITH SEROSITIS. SEE NOTE.  Assessment  DETERIORATING into MSOF despite agressive measures Resp, CV, liver, renal, intestinal, MS failure in an elderly patient with assisted living already.  Plan:  -prognosis grim - agree w "DNR" - switch to comfort care.  Primary service d/w family - they are coming soon. - There is no strong evidence of leak with current non-operative management.  There is no need for surgery at the present moment.   We will follow more peripherally -TNA/bowel rest for now.  Could try trophic TFs but with MSOF doubt that it would help -could consider CT scan to r/o abscess but with MSOF, seems   30 minutes spent in review, evaluation, examination, counseling, and coordination of care.  More than 50% of that time was spent in counseling.  Adin Hector, M.D., F.A.C.S. Gastrointestinal and Minimally Invasive Surgery [facility] [address] [place], [place] [address] [phone] Main / Paging   Feb 26, 2018    Subjective: (Chief complaint)  On CPAP Comfort Keeper home nurse at bedside SDU RN just outside room UOP worse MS worse  Objective:  Vital signs:  Vitals:   02-26-2018 0800 02-26-2018 0803 02/26/18 0900 02/26/18 1000  BP: (!) 83/46 (!) 83/46 (!) 82/45 (!) 70/37  Pulse: 84 84 76 80  Resp: '16 13 13 14  '$ Temp:  TempSrc:      SpO2: 95% 96% 97% 98%  Weight:      Height:        Last BM Date: 01/29/18  Intake/Output   Yesterday:  05/04 0701 - 05/05 0700 In: 2891.1 [I.V.:2217.3; Blood:373.8; IV  Piggyback:300] Out: 1400 [Urine:265; Emesis/NG output:60; Drains:1075] This shift:  Total I/O In: 35.7 [I.V.:35.7] Out: 90 [Drains:90]  Bowel function:  Flatus: No  BM:  No  Drain: Serous   Physical Exam:  General: Pt intubated/sedated.  Not following commands Eyes: PERRL, normal EOM.  Sclera clear.  Some jaudice Neuro: CN II-XII intact w/o focal sensory/motor deficits. Lymph: No head/neck/groin lymphadenopathy Psych:  No delerium/psychosis/paranoia HENT: Normocephalic, Mucus membranes moist.  No thrush.  CPAP in place Neck: Supple, No tracheal deviation Chest: No chest wall pain w good excursion CV:  Pulses intact.  irregular rhythm MS: Normal AROM mjr joints.  No obvious deformity  Abdomen: Somewhat firm.  Mildy distended.  Nontender.  Wound vac clean - no stool/bile/succus.   No evidence of peritonitis.  No incarcerated hernias.  Ext: 3+ anasarca.  No cyanosis Skin: No petechiae / purpura  Results:   Diagnosis 1. Appendix, Other than Incidental - APPENDIX WITH APPENDICITIS AND PROMINENT PERIAPPENDICITIS AND SEROSAL ADHESIONS, SEE NOTE. - NEGATIVE FOR PERFORATION. - NEGATIVE FOR MALIGNANCY. 2. Small intestine, resection - SEGMENT OF SMALL INTESTINE (39 CM) WITH SEROSITIS AND PROMINENT SEROSAL ADHESIONS WITH ASSOCIATED LUMINAL STENOSIS. - BENIGN REACTIVE LYMPH NODES. - NEGATIVE FOR MALIGNANCY 3. Fallopian tube, right - SEVERE PURULENT INFLAMMATION OF FALLOPIAN TUBE WITH SEROSITIS. SEE NOTE. Diagnosis Note 1. 2 & 3. The fallopian tube shows prominent purulent inflammation. Appendiceal inflammation is mostly in the form of periappendicitis. These findings are suggestive of primary fallopian tube inflammation, which possible spread to the appendix and small bowel. Clinical and radiologic correlation is suggested. (NK:ecj 02/03/2018) Jaquita Folds MD Pathologist, Electronic Signature (Case signed 02/03/2018) Specimen Sevan Mcbroom and Clinical Information Specimen(s)  Obtained: 1. Appendix, Other than Incidental 2. Small intestine, resection 3. Fallopian tube, right Specimen Clinical Information 1. Perforated appendix; small bowel obstruction (nt) 1 of 2 FINAL for GENEVIA, BOULDIN ([id number]) Devinn Hurwitz 1. Specimen: Appendix. Size: 4.9 cm in length, ranges from 0.5 to 1 cm in diameter. Serosa: Diffusely roughened, with scattered adherent indurated yellow tissue/material. Wall: The wall ranges from 0.2 to 0.5 cm thick, and appears intact throughout. Lumen: The lumen diameter is pinpoint throughout. Block Summary: Representative sections in two blocks. 2. Received fresh is a 39 cm in length segment of intestine consistent with clinically stated small bowel. The segment is tortuous, and the serosa has scattered areas of tan red to dark red indurated, roughened adhesions. On sectioning, the lumen diameter in the area of adhesions is stenosed to less than 1 cm. The mucosa is tan pink to light green, smooth, soft with focal edematous and slightly widely spaced intestinal folds. No mass lesions are identified. The wall within the areas of stenosis corresponding with external adhesions is up to 1 cm thick. There is a small amount of attached soft mesentery, which has few scattered adhesions, and unremarkable cut surfaces. Block summary: A,B= margins of resection. C,D= sections at stenotic areas, including serosal adhesions. E= section of mesentery with adhesions, and two possible lymph nodes. Total 5 blocks. 3. Received in formalin is a 4.9 cm in length tortuous segment of fimbriated fallopian tube, clinically right, which has pink-red to dark red serosa with diffuse adhesions. The tube is up to 1.1 cm in diameter, and  on sectioning, the lumen is dilated up to 0.5 cm and contains yellow white soft material. Block summary: A,B= cross sections of tube. C,D= distal end of tube. Total 4 blocks. (SW:gt, 02/03/18) Report signed out from the following  location(s) Technical component and interpretation was performed at [facility], [place], [place] [address]. CLIA #: [id number], 2 of  Labs: Results for orders placed or performed during the hospital encounter of 01/04/2018 (from the past 48 hour(s))  Heparin level (unfractionated)     Status: None   Collection Time: 02/04/18 10:47 AM  Result Value Ref Range   Heparin Unfractionated 0.32 0.30 - 0.70 IU/mL    Comment:        IF HEPARIN RESULTS ARE BELOW EXPECTED VALUES, AND PATIENT DOSAGE HAS BEEN CONFIRMED, SUGGEST FOLLOW UP TESTING OF ANTITHROMBIN III LEVELS. Performed at [facility], [place] [address], [place], [place] [address]   Glucose, capillary     Status: Abnormal   Collection Time: 02/04/18 11:22 AM  Result Value Ref Range   Glucose-Capillary 192 (H) 65 - 99 mg/dL  Glucose, capillary     Status: Abnormal   Collection Time: 02/04/18  3:41 PM  Result Value Ref Range   Glucose-Capillary 178 (H) 65 - 99 mg/dL   Comment 1 Notify RN   Basic metabolic panel     Status: Abnormal   Collection Time: 02/04/18  6:27 PM  Result Value Ref Range   Sodium 149 (H) 135 - 145 mmol/L   Potassium 4.0 3.5 - 5.1 mmol/L   Chloride 114 (H) 101 - 111 mmol/L   CO2 27 22 - 32 mmol/L   Glucose, Bld 155 (H) 65 - 99 mg/dL   BUN 92 (H) 6 - 20 mg/dL   Creatinine, Ser 1.77 (H) 0.44 - 1.00 mg/dL   Calcium 7.7 (L) 8.9 - 10.3 mg/dL   GFR calc non Af Amer 25 (L) >60 mL/min   GFR calc Af Amer 29 (L) >60 mL/min    Comment: (NOTE) The eGFR has been calculated using the CKD EPI equation. This calculation has not been validated in all clinical situations. eGFR's persistently <60 mL/min signify possible Chronic Kidney Disease.    Anion gap 8 5 - 15    Comment: Performed at [facility] [address], [place], [place] [address]  Glucose, capillary     Status: Abnormal   Collection Time: 02/04/18  7:54 PM  Result Value Ref Range   Glucose-Capillary 142 (H) 65 -  99 mg/dL  Glucose, capillary     Status: Abnormal   Collection Time: 02/05/18 12:39 AM  Result Value Ref Range   Glucose-Capillary 170 (H) 65 - 99 mg/dL  Glucose, capillary     Status: Abnormal   Collection Time: 02/05/18  4:30 AM  Result Value Ref Range   Glucose-Capillary 132 (H) 65 - 99 mg/dL  Protime-INR     Status: Abnormal   Collection Time: 02/05/18  4:45 AM  Result Value Ref Range   Prothrombin Time 20.6 (H) 11.4 - 15.2 seconds   INR 1.79     Comment: Performed at [facility], [place] [address], [place] [address]  CBC     Status: Abnormal   Collection Time: 02/05/18  4:45 AM  Result Value Ref Range   WBC 41.1 (H) 4.0 - 10.5 K/uL   RBC 2.38 (L) 3.87 - 5.11 MIL/uL   Hemoglobin 7.5 (L) 12.0 - 15.0 g/dL  HCT 24.0 (L) 36.0 - 46.0 %   MCV 100.8 (H) 78.0 - 100.0 fL   MCH 31.5 26.0 - 34.0 pg   MCHC 31.3 30.0 - 36.0 g/dL   RDW 20.9 (H) 11.5 - 15.5 %   Platelets 98 (L) 150 - 400 K/uL    Comment: CONSISTENT WITH PREVIOUS RESULT Performed at [facility] [address], [place], [place] [address]   Heparin level (unfractionated)     Status: None   Collection Time: 02/05/18  4:45 AM  Result Value Ref Range   Heparin Unfractionated 0.30 0.30 - 0.70 IU/mL    Comment:        IF HEPARIN RESULTS ARE BELOW EXPECTED VALUES, AND PATIENT DOSAGE HAS BEEN CONFIRMED, SUGGEST FOLLOW UP TESTING OF ANTITHROMBIN III LEVELS. Performed at [facility], [place] [address] [place], [place] [address]   Basic metabolic panel     Status: Abnormal   Collection Time: 02/05/18  4:45 AM  Result Value Ref Range   Sodium 146 (H) 135 - 145 mmol/L   Potassium 4.7 3.5 - 5.1 mmol/L   Chloride 112 (H) 101 - 111 mmol/L   CO2 27 22 - 32 mmol/L   Glucose, Bld 141 (H) 65 - 99 mg/dL   BUN 105 (H) 6 - 20 mg/dL   Creatinine, Ser 2.07 (H) 0.44 - 1.00 mg/dL   Calcium 7.5 (L) 8.9 - 10.3 mg/dL   GFR calc non Af Amer 21 (L) >60 mL/min   GFR calc Af Amer 24 (L) >60 mL/min    Comment:  (NOTE) The eGFR has been calculated using the CKD EPI equation. This calculation has not been validated in all clinical situations. eGFR's persistently <60 mL/min signify possible Chronic Kidney Disease.    Anion gap 7 5 - 15    Comment: Performed at [facility] [address], [place], [place] [address]  Magnesium     Status: None   Collection Time: 02/05/18  4:45 AM  Result Value Ref Range   Magnesium 2.4 1.7 - 2.4 mg/dL    Comment: Performed at [facility] [address], [place], [place] [address]  Phosphorus     Status: Abnormal   Collection Time: 02/05/18  4:45 AM  Result Value Ref Range   Phosphorus 6.5 (H) 2.5 - 4.6 mg/dL    Comment: Performed at [facility] [address], [place], [place] [address]  Glucose, capillary     Status: Abnormal   Collection Time: 02/05/18  8:00 AM  Result Value Ref Range   Glucose-Capillary 128 (H) 65 - 99 mg/dL  Prepare RBC     Status: None   Collection Time: 02/05/18  9:37 AM  Result Value Ref Range   Order Confirmation      ORDER PROCESSED BY BLOOD BANK Performed at [facility], [place] [address], [place], [place] [address]   Glucose, capillary     Status: Abnormal   Collection Time: 02/05/18 11:44 AM  Result Value Ref Range   Glucose-Capillary 127 (H) 65 - 99 mg/dL  Blood gas, arterial     Status: Abnormal   Collection Time: 02/05/18  3:29 PM  Result Value Ref Range   FIO2 80.00    Mode BILEVEL POSITIVE AIRWAY PRESSURE    LHR 10.0 resp/min   Peep/cpap 5.0 cm H20   Pressure control 15.0 cm H20   pH, Arterial 7.207 (L) 7.350 - 7.450   pCO2 arterial 64.6 (H)  32.0 - 48.0 mmHg   pO2, Arterial 116 (H) 83.0 - 108.0 mmHg   Bicarbonate 24.7 20.0 - 28.0 mmol/L   Acid-base deficit 2.3 (H) 0.0 - 2.0 mmol/L   O2 Saturation 98.1 %   Patient temperature 98.6    Collection site LEFT RADIAL    Drawn by [id number]    Sample type ARTERIAL DRAW    Allens test (pass/fail) PASS PASS  Glucose, capillary     Status:  Abnormal   Collection Time: 02/05/18  4:22 PM  Result Value Ref Range   Glucose-Capillary 171 (H) 65 - 99 mg/dL  Glucose, capillary     Status: Abnormal   Collection Time: 02/05/18  7:21 PM  Result Value Ref Range   Glucose-Capillary 205 (H) 65 - 99 mg/dL  Glucose, capillary     Status: Abnormal   Collection Time: 02/05/18 11:57 PM  Result Value Ref Range   Glucose-Capillary 196 (H) 65 - 99 mg/dL  Protime-INR     Status: Abnormal   Collection Time: 02-22-18  3:29 AM  Result Value Ref Range   Prothrombin Time 22.4 (H) 11.4 - 15.2 seconds   INR 1.99     Comment: Performed at [facility], [place] [address], [place], [place] [address]  CBC     Status: Abnormal   Collection Time: 02-22-18  3:29 AM  Result Value Ref Range   WBC 46.0 (H) 4.0 - 10.5 K/uL    Comment: REPEATED TO VERIFY   RBC 2.50 (L) 3.87 - 5.11 MIL/uL   Hemoglobin 7.7 (L) 12.0 - 15.0 g/dL   HCT 24.8 (L) 36.0 - 46.0 %   MCV 99.2 78.0 - 100.0 fL   MCH 30.8 26.0 - 34.0 pg   MCHC 31.0 30.0 - 36.0 g/dL   RDW 22.0 (H) 11.5 - 15.5 %   Platelets 98 (L) 150 - 400 K/uL    Comment: REPEATED TO VERIFY CONSISTENT WITH PREVIOUS RESULT Performed at [facility], [place] [address], [place], [place] [address]   Heparin level (unfractionated)     Status: None   Collection Time: Feb 22, 2018  3:29 AM  Result Value Ref Range   Heparin Unfractionated 0.62 0.30 - 0.70 IU/mL    Comment:        IF HEPARIN RESULTS ARE BELOW EXPECTED VALUES, AND PATIENT DOSAGE HAS BEEN CONFIRMED, SUGGEST FOLLOW UP TESTING OF ANTITHROMBIN III LEVELS. Performed at [facility], [place] [address] [place], [place] [address]   Basic metabolic panel     Status: Abnormal   Collection Time: 02/22/18  3:29 AM  Result Value Ref Range   Sodium 146 (H) 135 - 145 mmol/L   Potassium 5.2 (H) 3.5 - 5.1 mmol/L   Chloride 112 (H) 101 - 111 mmol/L   CO2 26 22 - 32 mmol/L   Glucose, Bld 135 (H) 65 - 99 mg/dL   BUN 136 (H) 6 - 20 mg/dL   Creatinine, Ser 2.84  (H) 0.44 - 1.00 mg/dL   Calcium 7.3 (L) 8.9 - 10.3 mg/dL   GFR calc non Af Amer 14 (L) >60 mL/min   GFR calc Af Amer 17 (L) >60 mL/min    Comment: (NOTE) The eGFR has been calculated using the CKD EPI equation. This calculation has not been validated in all clinical situations. eGFR's persistently <60 mL/min signify possible Chronic Kidney Disease.    Anion gap 8 5 - 15    Comment: Performed at [facility]  [address], [place], [place] [address]  Magnesium     Status: Abnormal   Collection Time: 03-03-2018  3:29 AM  Result Value Ref Range   Magnesium 2.5 (H) 1.7 - 2.4 mg/dL    Comment: Performed at [facility] [address] [place], [place] [address]  Phosphorus     Status: Abnormal   Collection Time: Mar 03, 2018  3:29 AM  Result Value Ref Range   Phosphorus 6.7 (H) 2.5 - 4.6 mg/dL    Comment: Performed at [facility] [address], [place], [place] [address]  Glucose, capillary     Status: Abnormal   Collection Time: 03-Mar-2018  3:46 AM  Result Value Ref Range   Glucose-Capillary 134 (H) 65 - 99 mg/dL  Glucose, capillary     Status: Abnormal   Collection Time: 03/03/18  7:52 AM  Result Value Ref Range   Glucose-Capillary 121 (H) 65 - 99 mg/dL    Imaging / Studies: Dg Chest Port 1 View  Result Date: 02/05/2018 CLINICAL DATA:  Pulmonary edema EXAM: PORTABLE CHEST 1 VIEW COMPARISON:  Chest radiograph from one day prior. FINDINGS: Enteric tube enters stomach with the tip not seen on this image. Intact sternotomy wires. Right PICC terminates over the right atrium. Stable cardiomediastinal silhouette with mild cardiomegaly. No pneumothorax. Small right pleural effusion is stable. Trace left pleural effusion is stable. Extensive patchy opacities throughout both lungs, most prominent in the left upper lung, not appreciably changed. IMPRESSION: 1. No appreciable change in extensive patchy bilateral lung opacities, which could represent severe pulmonary edema and/or  multilobar pneumonia. 2. Stable small right and trace left pleural effusions with mild cardiomegaly. Electronically Signed   By: Ilona Sorrel M.D.   On: 02/05/2018 09:08   Dg Chest Port 1 View  Result Date: 02/04/2018 CLINICAL DATA:  PICC line in place. EXAM: PORTABLE CHEST 1 VIEW COMPARISON:  One-view chest x-ray from the same day at 5:13 a.m. FINDINGS: The heart is enlarged. NG tube courses off the inferior border of the film. A left IJ line terminates at the mid SVC. A right-sided PICC line terminates at the cavoatrial junction. Diffuse interstitial and airspace disease is again seen bilaterally. Right greater than left pleural effusions are noted. There is no significant interval change. IMPRESSION: 1. Diffuse interstitial and airspace disease bilaterally compatible with multi lobar pneumonia or ARDS. 2. Right greater than left pleural effusions and associated basilar airspace disease. Electronically Signed   By: [place] Morelle M.D.   On: 02/04/2018 18:13    Medications / Allergies: per chart  Antibiotics: Anti-infectives (From admission, onward)   Start     Dose/Rate Route Frequency Ordered Stop   02/05/18 1100  vancomycin (VANCOCIN) IVPB 750 mg/150 ml premix     750 mg 150 mL/hr over 60 Minutes Intravenous Every 24 hours 02/05/18 0948     01/29/18 1000  vancomycin (VANCOCIN) 500 mg in sodium chloride 0.9 % 100 mL IVPB  Status:  Discontinued     500 mg 100 mL/hr over 60 Minutes Intravenous Every 12 hours 01/28/18 1411 01/27/2018 0812   01/28/18 1500  vancomycin (VANCOCIN) IVPB 1000 mg/200 mL premix     1,000 mg 200 mL/hr over 60 Minutes Intravenous  Once 01/28/18 1411 01/29/18 0102   01/22/18 1600  piperacillin-tazobactam (ZOSYN) IVPB 3.375 g     3.375 g 12.5 mL/hr over 240 Minutes Intravenous Every 8 hours 01/22/18 1014 02/14/2018 0936   01/20/18 1500  piperacillin-tazobactam (ZOSYN) IVPB 2.25 g  Status:  Discontinued     2.25 g 100 mL/hr over 30 Minutes Intravenous Every 6 hours  01/20/18 1211 01/22/18 1014   01/20/18 0500  piperacillin-tazobactam (ZOSYN) IVPB 2.25 g  Status:  Discontinued     2.25 g 100 mL/hr over 30 Minutes Intravenous Every 6 hours 01/10/2018 2220 01/20/18 1211   01/05/2018 2330  rifaximin (XIFAXAN) tablet 550 mg  Status:  Discontinued     550 mg Oral 2 times daily 01/25/2018 2224 02/02/18 0914   01/30/2018 2230  piperacillin-tazobactam (ZOSYN) IVPB 3.375 g     3.375 g 100 mL/hr over 30 Minutes Intravenous  Once 01/08/2018 2220 01/06/2018 2300        Note: Portions of this report may have been transcribed using voice recognition software. Every effort was made to ensure accuracy; however, inadvertent computerized transcription errors may be present.   Any transcriptional errors that result from this process are unintentional.     Adin Hector, M.D., F.A.C.S. Gastrointestinal and Minimally Invasive Surgery [facility] [address], [place], [place] [address] [phone] Main / Paging   26-Feb-2018

## 2018-03-05 NOTE — Progress Notes (Signed)
   2018-02-17 1735  Clinical Encounter Type  Visited With Other (Comment) (Family Pastor)  Visit Type Patient actively dying  Referral From Nurse  Consult/Referral To Chaplain   Responded to a page from floor nurse for EOL.  Upon arriving I met the family Doristine Bosworth outside the room.  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  Doristine Bosworth is going to remain near by.  I let him know if we could be of any service to let us know.  Will follow and support as needed. Chaplain Katherene Ponto

## 2018-03-05 DEATH — deceased

## 2018-03-09 ENCOUNTER — Ambulatory Visit: Payer: Medicare Other | Admitting: Cardiology

## 2018-03-16 ENCOUNTER — Ambulatory Visit: Payer: Medicare Other | Admitting: Cardiology

## 2018-05-20 ENCOUNTER — Ambulatory Visit: Payer: Medicare Other | Admitting: Neurology

## 2019-08-20 IMAGING — CT CT ABD-PELV W/ CM
3 of 5 series · 12 of 36 positions shown, 18 images · IV contrast (READICAT/WATER & [ID] ISOVUE 300)
Comparison: None.

CLINICAL DATA: Elevated transaminase level, localized edema

EXAM:
CT ABDOMEN AND PELVIS WITH CONTRAST
TECHNIQUE: Multidetector CT imaging of the abdomen and pelvis was performed
using the standard protocol following bolus administration of
intravenous contrast.
CONTRAST:  100 cc Rsovue-BGG

[Series 3: abd/pelvis with · axial · 0.70mm/px · z∈[-309,+6]mm · 8 of 82 slices shown, 13 images]
[im 10/82  soft-tissue]
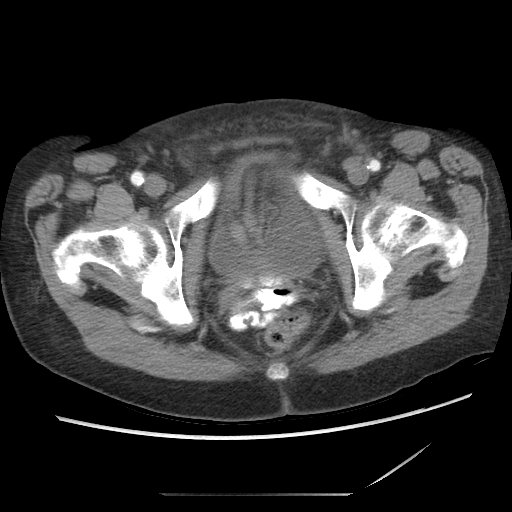
[im 10/82  bone]
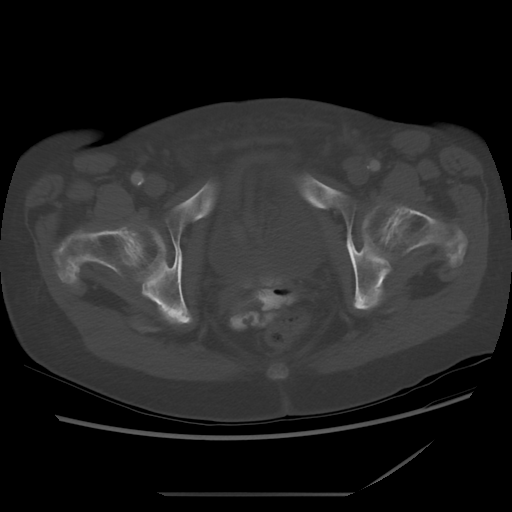
[im 19/82  soft-tissue]
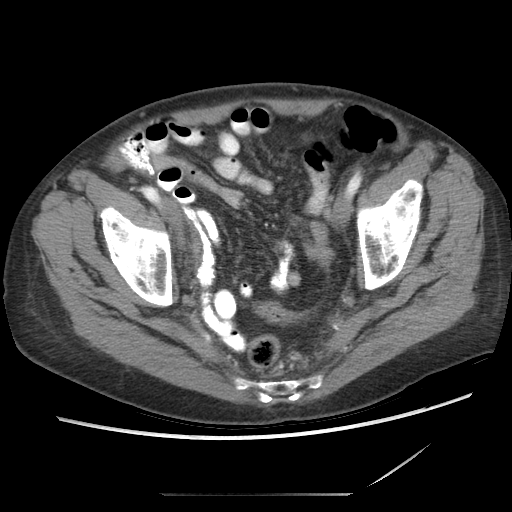
[im 28/82  soft-tissue]
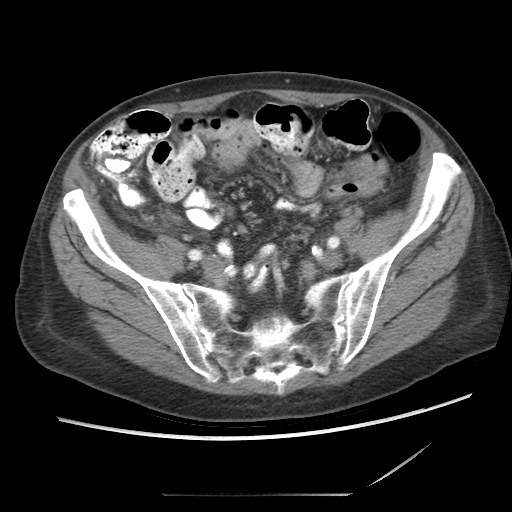
[im 37/82  soft-tissue]
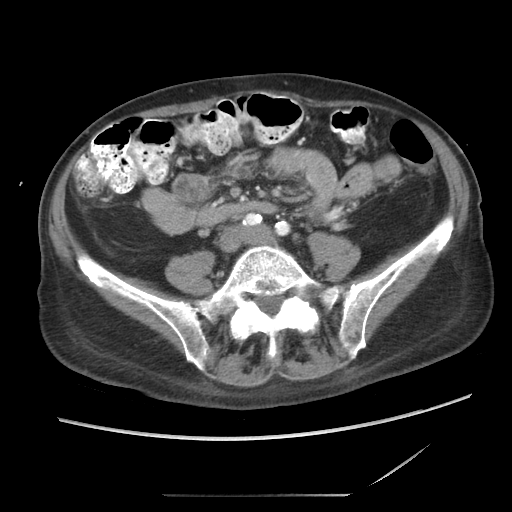
[im 46/82  soft-tissue]
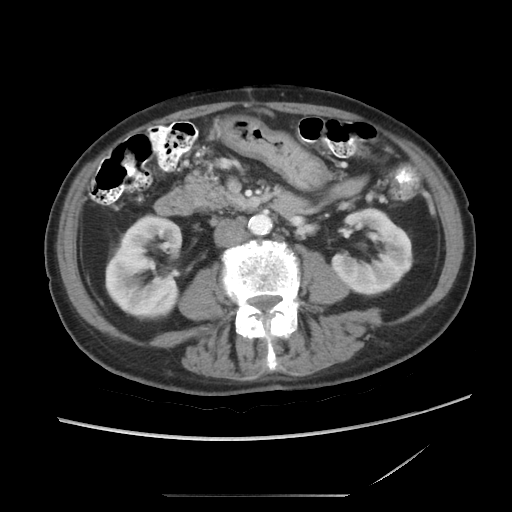
[im 46/82  lung]
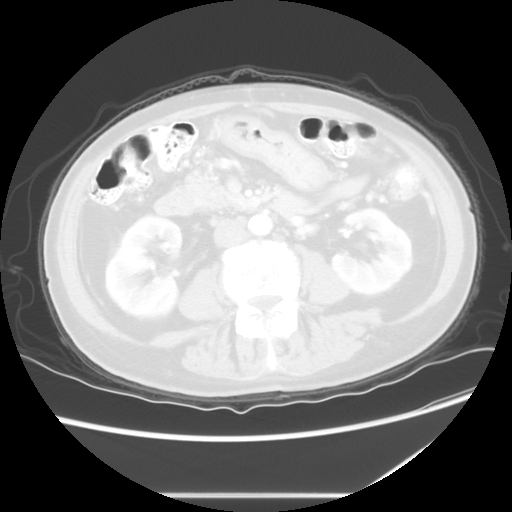
[im 55/82  soft-tissue]
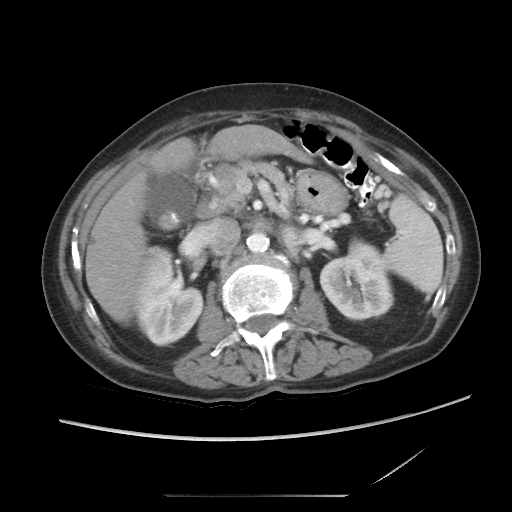
[im 55/82  lung]
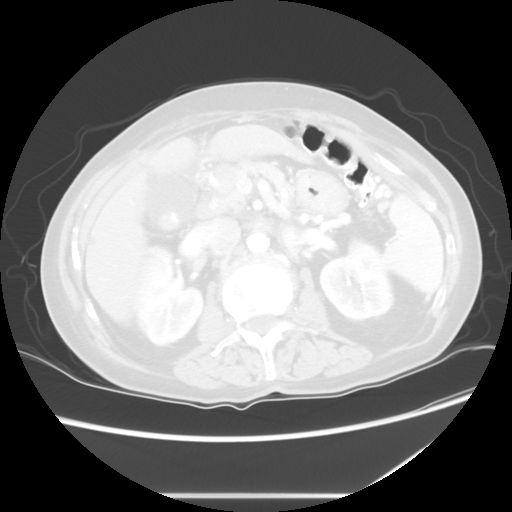
[im 64/82  soft-tissue]
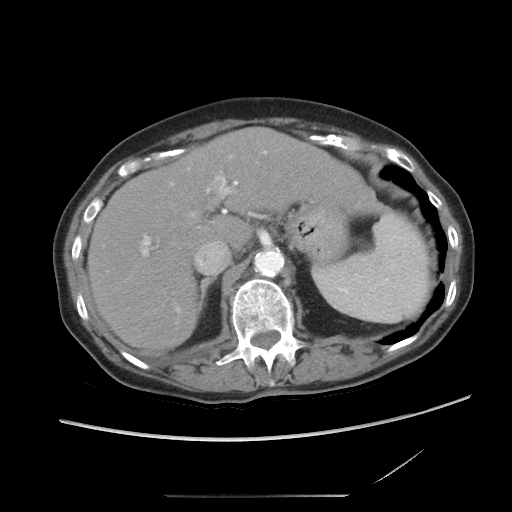
[im 64/82  lung]
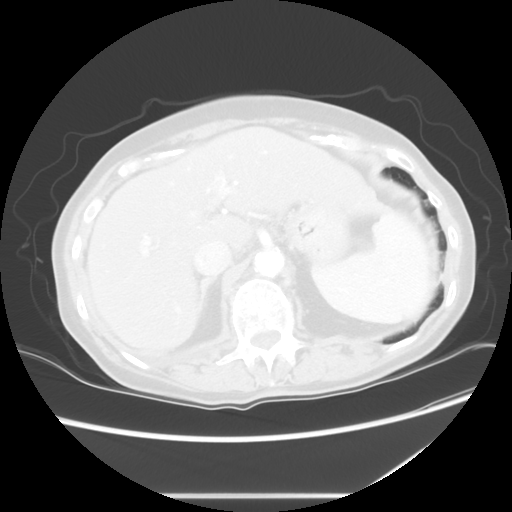
[im 73/82  soft-tissue]
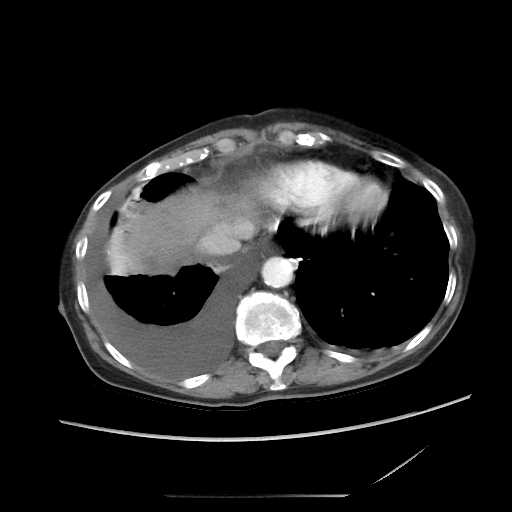
[im 73/82  lung]
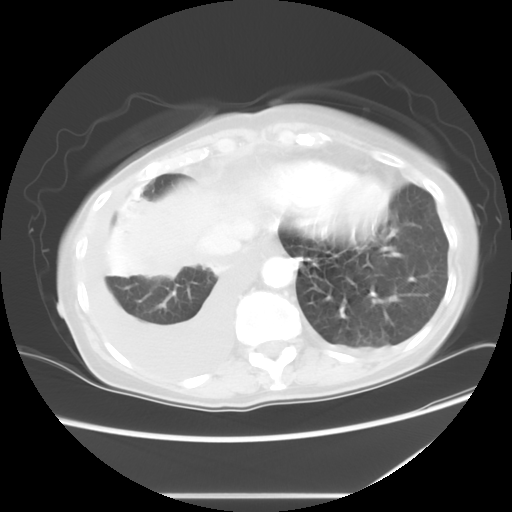

[Series 601: coronal body · coronal · 0.84mm/px · 1 of 103 slices shown, 2 images]
[im 35/103  soft-tissue]
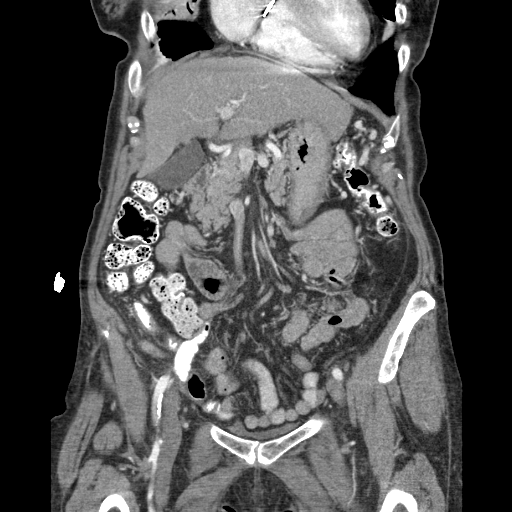
[im 35/103  bone]
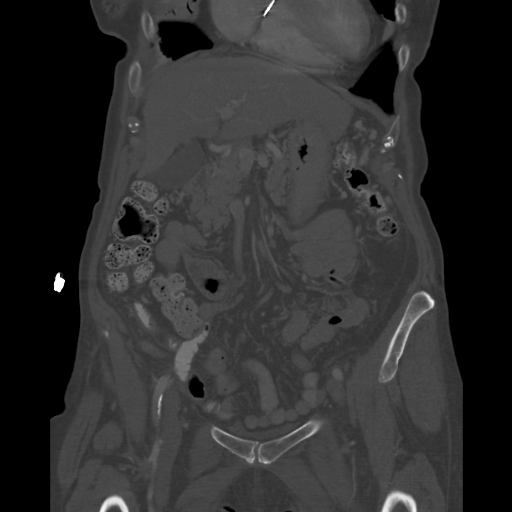

[Series 602: sagittal body · sagittal · 0.84mm/px · 3 of 145 slices shown]
[im 10/145  soft-tissue]
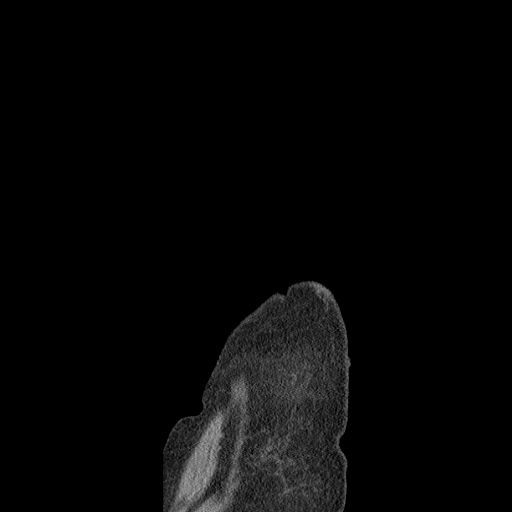
[im 29/145  soft-tissue]
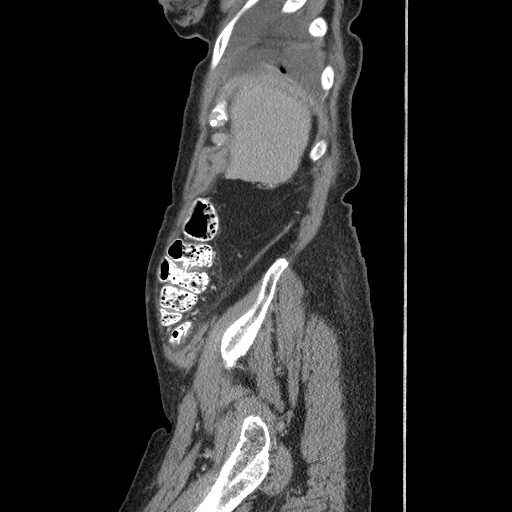
[im 49/145  soft-tissue]
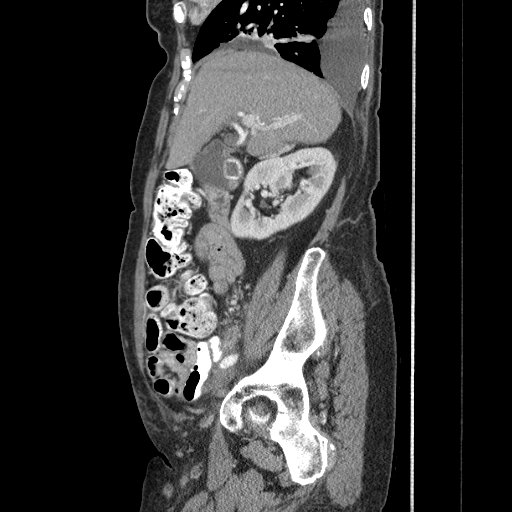

[12 of 36 positions shown; findings below may reference images not displayed]

FINDINGS: Lower chest: There is a moderate size right pleural effusion present
with resultant partial atelectasis of the right lower lobe. Partial
atelectasis of the right middle lobe also is noted. The left lung
base is clear. The heart is mildly enlarged. No pericardial effusion
is seen.

Hepatobiliary: There is a low-attenuation structure near the dome of
the right lobe of liver on image 17 measuring 9 mm in diameter.
Smaller low-attenuation structures are noted in the caudal right
lobe peripherally as well all which appear most typical of a benign
process such as slightly complex cysts. The gallbladder is well seen
with very minimal distension, but multiple gallstones are present
within the gallbladder. The gallbladder wall is not thickened and
the common bile duct does not appear to be distended.

Pancreas: The pancreas is moderately well seen with no abnormality
noted. The pancreatic duct is not dilated.

Spleen: The spleen is within normal limits in size.

Adrenals/Urinary Tract: The adrenal glands are unremarkable. The
kidneys enhance with no calculus or mass. No hydronephrosis is seen.
On delayed images, no abnormality is noted. The ureters appear to be
normal in caliber. The urinary bladder is not well distended and
therefore is difficult to assess. No gross abnormality is seen.

Stomach/Bowel: The stomach is largely decompressed and unremarkable.
No small bowel distention is seen. No abnormality of the colon is
seen. The terminal ileum opacifies the appendix is visualized in the
right lower quadrant and measures 8 mm in diameter. No surrounding
evidence of inflammation is seen but clinical correlation is
recommended to exclude developing appendicitis.

Vascular/Lymphatic: Moderate abdominal aortic atherosclerosis is
noted. No adenopathy is seen.

Reproductive: The uterus has previously been resected. No adnexal
lesion is seen. No fluid is noted within the pelvis.

Other: None.

Musculoskeletal: The lumbar vertebrae are normal alignment with
degenerative change within the facet joints of the lower lumbar
spine.
IMPRESSION: 1. Multiple gallstones within slightly distended gallbladder. No
gallbladder wall thickening is seen. Correlate clinically.
2. Moderate size right pleural effusion with partial atelectasis of
the right lower and right middle lobes.
3. Slight prominence of the appendix but no local inflammatory
change is seen. Again correlate clinically to exclude developing
acute appendicitis.
4. Moderate abdominal aortic atherosclerosis.
